# Patient Record
Sex: Female | Born: 1965 | Race: Black or African American | Hispanic: No | Marital: Married | Smoking: Never smoker
Health system: Southern US, Community
[De-identification: ages and names within clinical notes are randomized; demographics above are authoritative.]

## PROBLEM LIST (undated history)

## (undated) DIAGNOSIS — C50919 Malignant neoplasm of unspecified site of unspecified female breast: Secondary | ICD-10-CM

## (undated) DIAGNOSIS — E785 Hyperlipidemia, unspecified: Secondary | ICD-10-CM

## (undated) DIAGNOSIS — Z923 Personal history of irradiation: Secondary | ICD-10-CM

## (undated) DIAGNOSIS — N920 Excessive and frequent menstruation with regular cycle: Secondary | ICD-10-CM

## (undated) DIAGNOSIS — I10 Essential (primary) hypertension: Secondary | ICD-10-CM

## (undated) DIAGNOSIS — E663 Overweight: Secondary | ICD-10-CM

## (undated) DIAGNOSIS — Z9221 Personal history of antineoplastic chemotherapy: Secondary | ICD-10-CM

## (undated) DIAGNOSIS — R600 Localized edema: Secondary | ICD-10-CM

## (undated) DIAGNOSIS — E119 Type 2 diabetes mellitus without complications: Secondary | ICD-10-CM

## (undated) DIAGNOSIS — R609 Edema, unspecified: Secondary | ICD-10-CM

## (undated) DIAGNOSIS — Z8669 Personal history of other diseases of the nervous system and sense organs: Secondary | ICD-10-CM

## (undated) DIAGNOSIS — D649 Anemia, unspecified: Secondary | ICD-10-CM

## (undated) HISTORY — DX: Overweight: E66.3

## (undated) HISTORY — PX: TUBAL LIGATION: SHX77

## (undated) HISTORY — DX: Anemia, unspecified: D64.9

## (undated) HISTORY — DX: Essential (primary) hypertension: I10

## (undated) HISTORY — DX: Type 2 diabetes mellitus without complications: E11.9

## (undated) HISTORY — PX: COSMETIC SURGERY: SHX468

## (undated) HISTORY — PX: COLONOSCOPY: SHX174

## (undated) HISTORY — DX: Excessive and frequent menstruation with regular cycle: N92.0

## (undated) HISTORY — DX: Hyperlipidemia, unspecified: E78.5

## (undated) HISTORY — PX: CHOLECYSTECTOMY: SHX55

## (undated) HISTORY — PX: POLYPECTOMY: SHX149

---

## 1999-12-08 ENCOUNTER — Encounter (INDEPENDENT_AMBULATORY_CARE_PROVIDER_SITE_OTHER): Payer: Self-pay

## 1999-12-08 ENCOUNTER — Inpatient Hospital Stay (HOSPITAL_COMMUNITY): Admission: AD | Admit: 1999-12-08 | Discharge: 1999-12-11 | Payer: Self-pay | Admitting: Obstetrics and Gynecology

## 2000-01-11 ENCOUNTER — Other Ambulatory Visit: Admission: RE | Admit: 2000-01-11 | Discharge: 2000-01-11 | Payer: Self-pay | Admitting: Obstetrics and Gynecology

## 2001-04-10 ENCOUNTER — Other Ambulatory Visit: Admission: RE | Admit: 2001-04-10 | Discharge: 2001-04-10 | Payer: Self-pay | Admitting: Gynecology

## 2002-05-28 ENCOUNTER — Other Ambulatory Visit: Admission: RE | Admit: 2002-05-28 | Discharge: 2002-05-28 | Payer: Self-pay | Admitting: Gynecology

## 2003-06-07 ENCOUNTER — Other Ambulatory Visit: Admission: RE | Admit: 2003-06-07 | Discharge: 2003-06-07 | Payer: Self-pay | Admitting: Gynecology

## 2004-06-08 ENCOUNTER — Other Ambulatory Visit: Admission: RE | Admit: 2004-06-08 | Discharge: 2004-06-08 | Payer: Self-pay | Admitting: Gynecology

## 2004-06-10 ENCOUNTER — Encounter: Payer: Self-pay | Admitting: Family Medicine

## 2004-06-10 LAB — CONVERTED CEMR LAB

## 2004-07-27 ENCOUNTER — Ambulatory Visit: Payer: Self-pay | Admitting: Internal Medicine

## 2004-08-18 ENCOUNTER — Ambulatory Visit: Payer: Self-pay | Admitting: Family Medicine

## 2004-08-31 ENCOUNTER — Ambulatory Visit: Payer: Self-pay | Admitting: Family Medicine

## 2005-03-09 ENCOUNTER — Ambulatory Visit: Payer: Self-pay | Admitting: Family Medicine

## 2005-07-23 ENCOUNTER — Other Ambulatory Visit: Admission: RE | Admit: 2005-07-23 | Discharge: 2005-07-23 | Payer: Self-pay | Admitting: Gynecology

## 2005-08-16 ENCOUNTER — Ambulatory Visit: Payer: Self-pay | Admitting: Family Medicine

## 2005-09-04 ENCOUNTER — Ambulatory Visit: Payer: Self-pay | Admitting: Family Medicine

## 2005-12-25 ENCOUNTER — Ambulatory Visit: Payer: Self-pay | Admitting: Family Medicine

## 2005-12-26 ENCOUNTER — Encounter: Admission: RE | Admit: 2005-12-26 | Discharge: 2005-12-26 | Payer: Self-pay | Admitting: Family Medicine

## 2006-08-05 ENCOUNTER — Ambulatory Visit: Payer: Self-pay | Admitting: Family Medicine

## 2006-09-11 ENCOUNTER — Ambulatory Visit: Payer: Self-pay | Admitting: Family Medicine

## 2006-11-06 ENCOUNTER — Ambulatory Visit: Payer: Self-pay | Admitting: Family Medicine

## 2006-11-06 LAB — CONVERTED CEMR LAB
ALT: 16 units/L (ref 0–40)
AST: 22 units/L (ref 0–37)
BUN: 8 mg/dL (ref 6–23)
Chloride: 105 meq/L (ref 96–112)
Cholesterol: 139 mg/dL (ref 0–200)
GFR calc Af Amer: 102 mL/min
Lymphocytes Relative: 27 % (ref 12.0–46.0)
MCHC: 34.5 g/dL (ref 30.0–36.0)
MCV: 86.8 fL (ref 78.0–100.0)
Monocytes Absolute: 0.6 10*3/uL (ref 0.2–0.7)
Neutrophils Relative %: 63.1 % (ref 43.0–77.0)
Phosphorus: 2.5 mg/dL (ref 2.3–4.6)
Potassium: 3.6 meq/L (ref 3.5–5.1)
Sodium: 139 meq/L (ref 135–145)
Triglycerides: 105 mg/dL (ref 0–149)
WBC: 8.8 10*3/uL (ref 4.5–10.5)

## 2007-07-12 LAB — CONVERTED CEMR LAB: Pap Smear: NORMAL

## 2007-08-15 ENCOUNTER — Encounter: Payer: Self-pay | Admitting: Family Medicine

## 2007-08-15 DIAGNOSIS — I10 Essential (primary) hypertension: Secondary | ICD-10-CM

## 2007-08-20 ENCOUNTER — Ambulatory Visit: Payer: Self-pay | Admitting: Family Medicine

## 2007-08-25 LAB — CONVERTED CEMR LAB
Calcium: 9.3 mg/dL (ref 8.4–10.5)
Chloride: 102 meq/L (ref 96–112)
GFR calc Af Amer: 89 mL/min
GFR calc non Af Amer: 74 mL/min
Glucose, Bld: 102 mg/dL — ABNORMAL HIGH (ref 70–99)
HCT: 36.7 % (ref 36.0–46.0)
Lymphocytes Relative: 28.1 % (ref 12.0–46.0)
MCHC: 33.8 g/dL (ref 30.0–36.0)
MCV: 89 fL (ref 78.0–100.0)
Neutrophils Relative %: 63 % (ref 43.0–77.0)
Platelets: 251 10*3/uL (ref 150–400)
RBC: 4.13 M/uL (ref 3.87–5.11)
RDW: 13.6 % (ref 11.5–14.6)
Sodium: 138 meq/L (ref 135–145)
TSH: 0.63 microintl units/mL (ref 0.35–5.50)

## 2007-12-05 ENCOUNTER — Telehealth: Payer: Self-pay | Admitting: Family Medicine

## 2008-03-22 ENCOUNTER — Telehealth: Payer: Self-pay | Admitting: Family Medicine

## 2008-03-23 ENCOUNTER — Ambulatory Visit: Payer: Self-pay | Admitting: Family Medicine

## 2008-04-02 ENCOUNTER — Telehealth (INDEPENDENT_AMBULATORY_CARE_PROVIDER_SITE_OTHER): Payer: Self-pay | Admitting: *Deleted

## 2008-08-24 ENCOUNTER — Ambulatory Visit: Payer: Self-pay | Admitting: Family Medicine

## 2008-08-26 LAB — CONVERTED CEMR LAB
ALT: 19 units/L (ref 0–35)
AST: 24 units/L (ref 0–37)
Basophils Absolute: 0 10*3/uL (ref 0.0–0.1)
Basophils Relative: 0 % (ref 0.0–3.0)
Calcium: 9.2 mg/dL (ref 8.4–10.5)
Cholesterol: 153 mg/dL (ref 0–200)
Creatinine, Ser: 0.8 mg/dL (ref 0.4–1.2)
GFR calc Af Amer: 101 mL/min
Hemoglobin: 10.9 g/dL — ABNORMAL LOW (ref 12.0–15.0)
LDL Cholesterol: 91 mg/dL (ref 0–99)
MCV: 87.9 fL (ref 78.0–100.0)
Monocytes Absolute: 0.3 10*3/uL (ref 0.1–1.0)
Neutro Abs: 7.2 10*3/uL (ref 1.4–7.7)
Phosphorus: 3.4 mg/dL (ref 2.3–4.6)
RBC: 3.58 M/uL — ABNORMAL LOW (ref 3.87–5.11)
TSH: 1.05 microintl units/mL (ref 0.35–5.50)
Total Bilirubin: 0.4 mg/dL (ref 0.3–1.2)
Total Protein: 7.4 g/dL (ref 6.0–8.3)
WBC: 10.7 10*3/uL — ABNORMAL HIGH (ref 4.5–10.5)

## 2008-08-27 ENCOUNTER — Telehealth (INDEPENDENT_AMBULATORY_CARE_PROVIDER_SITE_OTHER): Payer: Self-pay | Admitting: *Deleted

## 2008-09-27 ENCOUNTER — Ambulatory Visit: Payer: Self-pay | Admitting: Family Medicine

## 2008-09-27 DIAGNOSIS — D509 Iron deficiency anemia, unspecified: Secondary | ICD-10-CM

## 2008-09-29 LAB — CONVERTED CEMR LAB
Basophils Absolute: 0.1 10*3/uL (ref 0.0–0.1)
Eosinophils Absolute: 0.2 10*3/uL (ref 0.0–0.7)
Hemoglobin: 11.7 g/dL — ABNORMAL LOW (ref 12.0–15.0)
Iron: 104 ug/dL (ref 42–145)
Lymphocytes Relative: 24.6 % (ref 12.0–46.0)
MCHC: 33.7 g/dL (ref 30.0–36.0)
MCV: 89.1 fL (ref 78.0–100.0)
Monocytes Absolute: 0.5 10*3/uL (ref 0.1–1.0)
Neutro Abs: 5 10*3/uL (ref 1.4–7.7)
Platelets: 240 10*3/uL (ref 150–400)
Transferrin: 296.5 mg/dL (ref >=212.0)

## 2008-10-27 ENCOUNTER — Ambulatory Visit: Payer: Self-pay | Admitting: Internal Medicine

## 2009-08-31 ENCOUNTER — Ambulatory Visit: Payer: Self-pay | Admitting: Family Medicine

## 2009-09-05 LAB — CONVERTED CEMR LAB
ALT: 18 units/L (ref 0–35)
Alkaline Phosphatase: 80 units/L (ref 39–117)
BUN: 10 mg/dL (ref 6–23)
Basophils Absolute: 0.1 10*3/uL (ref 0.0–0.1)
Basophils Relative: 0.7 % (ref 0.0–3.0)
Chloride: 107 meq/L (ref 96–112)
Cholesterol: 133 mg/dL (ref 0–200)
Eosinophils Absolute: 0.2 10*3/uL (ref 0.0–0.7)
Eosinophils Relative: 1.9 % (ref 0.0–5.0)
GFR calc non Af Amer: 87.87 mL/min (ref >60)
Glucose, Bld: 96 mg/dL (ref 70–99)
HCT: 35.7 % — ABNORMAL LOW (ref 36.0–46.0)
Hemoglobin: 11.8 g/dL — ABNORMAL LOW (ref 12.0–15.0)
Lymphs Abs: 2.5 10*3/uL (ref 0.7–4.0)
Monocytes Absolute: 0.7 10*3/uL (ref 0.1–1.0)
Monocytes Relative: 7.4 % (ref 3.0–12.0)
Neutro Abs: 5.3 10*3/uL (ref 1.4–7.7)
Platelets: 208 10*3/uL (ref 150.0–400.0)
RBC: 3.83 M/uL — ABNORMAL LOW (ref 3.87–5.11)
RDW: 14.1 % (ref 11.5–14.6)
WBC: 8.8 10*3/uL (ref 4.5–10.5)

## 2010-08-02 ENCOUNTER — Ambulatory Visit: Payer: Self-pay | Admitting: Internal Medicine

## 2010-09-07 ENCOUNTER — Ambulatory Visit: Payer: Self-pay | Admitting: Family Medicine

## 2010-09-13 LAB — CONVERTED CEMR LAB
Albumin: 3.6 g/dL (ref 3.5–5.2)
Alkaline Phosphatase: 88 units/L (ref 39–117)
Bilirubin, Direct: 0.1 mg/dL (ref 0.0–0.3)
Calcium: 8.8 mg/dL (ref 8.4–10.5)
Chloride: 101 meq/L (ref 96–112)
Eosinophils Absolute: 0.2 10*3/uL (ref 0.0–0.7)
Eosinophils Relative: 1.9 % (ref 0.0–5.0)
Glucose, Bld: 93 mg/dL (ref 70–99)
HCT: 38.7 % (ref 36.0–46.0)
HDL: 37.5 mg/dL — ABNORMAL LOW (ref >39.00)
Hemoglobin: 13 g/dL (ref 12.0–15.0)
LDL Cholesterol: 88 mg/dL (ref 0–99)
MCHC: 33.5 g/dL (ref 30.0–36.0)
MCV: 93.3 fL (ref 78.0–100.0)
Monocytes Relative: 6.8 % (ref 3.0–12.0)
Neutro Abs: 6 10*3/uL (ref 1.4–7.7)
Platelets: 233 10*3/uL (ref 150.0–400.0)
RDW: 14.8 % — ABNORMAL HIGH (ref 11.5–14.6)
Total CHOL/HDL Ratio: 4
Triglycerides: 59 mg/dL (ref 0.0–149.0)
VLDL: 11.8 mg/dL (ref 0.0–40.0)

## 2010-10-12 NOTE — Assessment & Plan Note (Signed)
Summary: URI   Vital Signs:  Patient profile:   45 year old female Height:      66 inches Weight:      187.25 pounds BMI:     30.33 Temp:     98.4 degrees F oral Pulse rate:   72 / minute Pulse rhythm:   regular BP sitting:   120 / 74  (left arm) Cuff size:   regular  Vitals Entered By: Selena Batten Dance CMA Duncan Dull) (August 02, 2010 11:45 AM) CC: URI   History of Present Illness: CC: URI?  6d h/o ST, hoaresness.  4d ago started feeling poorly - malaise, fatigue, bad cough..  ST better.  + congestion, cough, watery eyes, RN.  Cough productive of green sputum, keeping her up at night.  Worse at night.  Tried alkaseltzer plus, didn't help.    No fevers/chills, abd pain, n/v/d, rashes, myalgia/arthralgia.    no sick contacts at home.  No smokers at home.  no h/o allergies, asthma  Current Medications (verified): 1)  Triamterene-Hctz 75-50 Mg Tabs (Triamterene-Hctz) .... Take 1/2 Tablet Once A Day 2)  Potassium Chloride Crys Cr 20 Meq Tbcr (Potassium Chloride Crys Cr) .... Take 2 Tablet By Mouth Once A Day 3)  Cozaar 50 Mg Tabs (Losartan Potassium) .... 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 Head:  normocephalic, atraumatic, and no abnormalities observed.  NT sinuses Eyes:  PERRLA, EOMI Ears:  TMs clear bilaterally Nose:  congested Mouth:  pharynx pink and moist.  no exudates Neck:  supple with full rom and no masses or thyromegally, no JVD or carotid bruit  Lungs:  Normal respiratory effort, chest expands symmetrically. Lungs are clear to auscultation, no crackles or wheezes. Heart:  Normal rate and  regular rhythm. S1 and S2 normal without gallop, murmur, click, rub or other extra sounds. Pulses:  2+ rad pulses Extremities:  no pedal edema Skin:  Intact without suspicious lesions or rashes   Impression & Recommendations:  Problem # 1:  ACUTE BRONCHITIS (ICD-466.0) early bronchitis.  almost 7 days now.  given going into long weekend, provided with abx script to fill if not better by weekend.  Instructed on symptomatic treatment. Call if symptoms persist or worsen.   hopeful to not need abx.  cheratussin for night time cough.  Her updated medication list for this problem includes:    Cheratussin Ac 100-10 Mg/52ml Syrp (Guaifenesin-codeine) ..... One teaspoon at bedtime as needed cough    Zithromax Z-pak 250 Mg Tabs (Azithromycin) .Marland Kitchen... Take as directed  Complete Medication List: 1)  Triamterene-hctz 75-50 Mg Tabs (Triamterene-hctz) .... Take 1/2 tablet once a day 2)  Potassium Chloride Crys Cr 20 Meq Tbcr (Potassium chloride crys cr) .... Take 2 tablet by mouth once a day 3)  Cozaar 50 Mg Tabs (Losartan potassium) .... Take 1 tablet by mouth once a day 4)  Cheratussin Ac 100-10 Mg/78ml Syrp (Guaifenesin-codeine) .... One teaspoon at bedtime as needed cough 5)  Zithromax Z-pak 250 Mg Tabs (Azithromycin) .... Take as directed  Patient Instructions: 1)  Sounds like you have a viral upper respiratory infection.  2)  Antibiotics are not needed for this.  Viral infections usually take 7-10 days to resolve.  The cough can last up to 4-6 weeks to go away. 3)  Use medication as prescribed: zpack to hold on to in case not better by weekend.  cheratussin at night. 4)  May continue delsym or robitussin. 5)  consider nasal saline spray for congestion. 6)  Push fluids and plenty of rest. 7)  Please return if you are not improving as expected, or if you have high fevers (>101.5) or difficulty breathing/swallowing. 8)  Call clinic with questions.  Pleasure to see you today!  Prescriptions: ZITHROMAX  Z-PAK 250 MG TABS (AZITHROMYCIN) take as directed  #1 x 0   Entered and Authorized by:   Eustaquio Boyden  MD   Signed by:   Eustaquio Boyden  MD on 08/02/2010   Method used:   Print then Give to Patient   RxID:   [id number] CHERATUSSIN AC 100-10 MG/5ML SYRP (GUAIFENESIN-CODEINE) one teaspoon at bedtime as needed cough  #100cc x 0   Entered and Authorized by:   Eustaquio Boyden  MD   Signed by:   Eustaquio Boyden  MD on 08/02/2010   Method used:   Print then Give to Patient   RxID:   [id number]    Orders Added: 1)  Est. Patient Level III [84696]    Current Allergies (reviewed today): ACE INHIBITORS

## 2010-10-12 NOTE — Assessment & Plan Note (Signed)
Summary: F/U BP,REFILL MEDICINE/CLE   Vital Signs:  Patient profile:   45 year old female Weight:      190 pounds BMI:     30.78 Temp:     98.0 degrees F oral Pulse rate:   60 / minute Pulse rhythm:   regular BP sitting:   120 / 70  (left arm) Cuff size:   regular  Vitals Entered By: Mervin Hack CMA Duncan Dull) (September 07, 2010 8:11 AM) CC: follow-up visit for blood pressure and refill meds   History of Present Illness: here for f/u of HTN  wt is up 3 lb with bmi of 30  doing well -- no new medical problems in general   was sick before thanksgiving with laryngitis   bp great today 120/70-- has been well controlled on cozaar and maxzide  did gain some wt over the holidays  does terrible over wintertime  gets out a bit -- but only if decent outside   does the wii fit at home   is eating healthy diet except for sweets  needs to quit those   needs labs today for HTN   went to gyn on 12/19- everything was good  menses is irregular at this point  has anemia  does not take iron     Allergies: 1)  Ace Inhibitors  Past History:  Past Medical History: Last updated: 09/27/2008 Hypertension menorrhagia overwt  iron def anemia   GYN  Past Surgical History: Last updated: 09/27/2008 Caesarean section Cholecystectomy Tubal ligation MRI brain- neg (12/2005) gyn proceedure for heavy menses   Family History: Last updated: September 01, 2008 Father: deceased- lung cancer, smoker, HTN, ETOH  Mother: HTN, CVA age 24 x2 DM- died 01-25-23 Siblings: sister with HTN  Social History: Last updated: 2008/09/01 Marital Status: Married Children: 2 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 Eyes:  Denies blurring and eye irritation. CV:  Denies chest pain or discomfort, lightheadness, near fainting, and palpitations. Resp:  Denies cough,  shortness of breath, and wheezing. GI:  Denies abdominal pain, change in bowel habits, indigestion, and nausea. GU:  Denies dysuria, incontinence, and urinary frequency. MS:  Denies muscle aches and cramps. Derm:  Denies itching, lesion(s), poor wound healing, and rash. Neuro:  Denies headaches, numbness, and tingling. Psych:  Denies anxiety and depression. Endo:  Denies excessive thirst and excessive urination. Heme:  Denies abnormal bruising and bleeding.  Physical Exam  General:  overweight but generally well appearing  Head:  normocephalic, atraumatic, and no abnormalities observed.   Eyes:  vision grossly intact, pupils equal, pupils round, and pupils reactive to light.  no conjunctival pallor, injection or icterus  Mouth:  pharynx pink and moist.   Neck:  supple with full rom and no masses or thyromegally, no JVD or carotid bruit  Chest Wall:  No deformities, masses, or tenderness noted. Lungs:  Normal respiratory effort, chest expands symmetrically. Lungs are clear to auscultation, no crackles or wheezes. Heart:  Normal rate and regular rhythm. S1 and S2 normal without gallop, murmur, click, rub or other extra sounds. Msk:  No deformity or scoliosis noted of thoracic or lumbar spine.   Pulses:  2+ rad pulses Extremities:  no pedal edema Neurologic:  sensation intact to light touch, gait normal, and DTRs symmetrical and normal.   Skin:  Intact without suspicious lesions or rashes Cervical Nodes:  No lymphadenopathy  noted Psych:  normal affect, talkative and pleasant    Impression & Recommendations:  Problem # 1:  HYPERTENSION (ICD-401.9) Assessment Unchanged  this is well controlled on current medicine without change disc healthy diet (low simple sugar/ choose complex carbs/ low sat fat) diet and exercise in detail  disc imp of wt loss lab today meds refilled Her updated medication list for this problem includes:    Triamterene-hctz 75-50 Mg Tabs (Triamterene-hctz) .Marland Kitchen...  Take 1/2 tablet once a day    Cozaar 50 Mg Tabs (Losartan potassium) .Marland Kitchen... Take 1 tablet by mouth once a day  Orders: Venipuncture (16109) TLB-Renal Function Panel (80069-RENAL) TLB-Lipid Panel (80061-LIPID) TLB-CBC Platelet - w/Differential (85025-CBCD) TLB-Hepatic/Liver Function Pnl (80076-HEPATIC) TLB-TSH (Thyroid Stimulating Hormone) (60454-UJW) Prescription Created Electronically [id number])  BP today: 120/70 Prior BP: 120/74 (08/02/2010)  Labs Reviewed: K+: 3.7 (08/31/2009) Creat: : 0.9 (08/31/2009)   Chol: 133 (08/31/2009)   HDL: 36.00 (08/31/2009)   LDL: 87 (08/31/2009)   TG: 50.0 (08/31/2009)  Problem # 2:  ANEMIA, IRON DEFICIENCY (ICD-280.9) Assessment: Unchanged  from menses- irregular utd gyn care  not on iron check cbc  no symptoms  Orders: TLB-CBC Platelet - w/Differential (85025-CBCD) Prescription Created Electronically [phone])  Hgb: 11.8 (08/31/2009)   Hct: 35.7 (08/31/2009)   Platelets: 208.0 (08/31/2009) RBC: 3.83 (08/31/2009)   RDW: 14.1 (08/31/2009)   WBC: 8.8 (08/31/2009) MCV: 93.3 (08/31/2009)   MCHC: 33.1 (08/31/2009) Iron: 104 (09/27/2008)   % Sat: 25.1 (09/27/2008) TSH: 0.94 (08/31/2009)  Complete Medication List: 1)  Triamterene-hctz 75-50 Mg Tabs (Triamterene-hctz) .... Take 1/2 tablet once a day 2)  Potassium Chloride Crys Cr 20 Meq Tbcr (Potassium chloride crys cr) .... Take 2 tablet by mouth once a day 3)  Cozaar 50 Mg Tabs (Losartan potassium) .... Take 1 tablet by mouth once a day  Patient Instructions: 1)  It is important that you exercise reguarly at least 20 minutes 5 times a week. If you develop chest pain, have severe difficulty breathing, or feel very tired, stop exercising immediately and seek medical attention.  2)  work on [employer] - get sweets out of the house  3)  bp is good  4)  labs today  Prescriptions: COZAAR 50 MG TABS (LOSARTAN POTASSIUM) Take 1 tablet by mouth once a day  #30 x 12   Entered by:   Mervin Hack CMA  (AAMA)   Authorized by:   Judith Part MD   Signed by:   Mervin Hack CMA (AAMA) on 09/07/2010   Method used:   Electronically to        [facility] [id number] (retail)       [address]       [place], [place]  [address]       Ph: [phone] or [phone]       Fax: [phone]   RxID:   [id number] POTASSIUM CHLORIDE CRYS CR 20 MEQ TBCR (POTASSIUM CHLORIDE CRYS CR) Take 2 tablet by mouth once a day  #60 x 12   Entered by:   Mervin Hack CMA (AAMA)   Authorized by:   Judith Part MD   Signed by:   Mervin Hack CMA (AAMA) on 09/07/2010   Method used:   [facility]        [facility] (retail[facility]       [address]       [place], [place]  [address]       Ph: [phone] or [phone]  Fax: [phone]   RxID:   [id number] TRIAMTERENE-HCTZ 75-50 MG TABS (TRIAMTERENE-HCTZ) Take 1/2 tablet once a day  #30 x 6   Entered by:   Mervin Hack CMA (AAMA)   Authorized by:   Judith Part MD   Signed by:   Mervin Hack CMA (AAMA) on 09/07/2010   Method used:   [facility]        [facility] [id number]       [address]       [place], [place]  [address]       Ph: [phone] or [phone]       Fax: [phone]   RxID:   [id number]    Orders Added: 1)  Venipuncture [18841] 2)  TLB-Renal Function Panel [80069-RENAL] 3)  TLB-Lipid Panel [80061-LIPID] 4)  TLB-CBC Platelet - w/Differential [85025-CBCD] 5)  TLB-Hepatic/Liver Function Pnl [80076-HEPATIC] 6)  TLB-TSH (Thyroid Stimulating Hormone) [84443-TSH] 7)  Est. Patient Level III [66063] 8)  Prescription Created Electronically [phone]    Current Allergies (reviewed today): ACE INHIBITORS   Preventive Care Screening  Pap Smear:    Date:  08/28/2010    Results:  normal      mam sched for jan 2012

## 2011-01-26 NOTE — Op Note (Signed)
[facility]  Patient:    Lindsey Wright, Lindsey Wright                MRN: [id number] Proc. Date: 12/08/99 Adm. Date:  [id number] Attending:  Miguel Aschoff                           Operative Report  PREOPERATIVE DIAGNOSES:       1. Intrauterine pregnancy at 38 weeks.                               2. Early labor.                               3. Previous cesarean section.                               4. Desired sterilization.  POSTOPERATIVE DIAGNOSES:      1. Intrauterine pregnancy at 38 weeks.                               2. Early labor.                               3. Previous cesarean section.                               4. Desired sterilization.                               5. Delivery of viable female infant.  Apgars 8 and 9.  PROCEDURE:                    Repeat left lateral transverse cesarean section and bilateral ______ tubal sterilization.  SURGEON:                      Miguel Aschoff, M.D.  ANESTHESIA:                   Spinal.  COMPLICATIONS:                None.  JUSTIFICATION:                Patient is a 45 year old black female gravida 2, ara 1-0-0-1 with an estimated date of confinement of December 19, 1999.  The patient had been scheduled for elective repeat cesarean section for April 4.  The patient developed spontaneous labor on the date of admission and in view of her onset of labor she is being taken to the operating room at this time to undergo elective  repeat cesarean section after declining an attempt at [facility].  The patient also desires a permanent sterilization and has signed informed consent for tubal sterilization.  PROCEDURE:                    Patient was taken to the operating room, placed in the lateral position and spinal anesthesia was placed without difficulty.  She as then placed in the supine position deviated to the left and prepped  and draped n the usual sterile fashion.  A Foley catheter was inserted.   After this was done a Pfannenstiel incision was made, extended down through the subcutaneous tissue with ______ being clamped and coagulation encountered.  The fascia was then identified, incised transversely.  It was then separated from the underlying rectus muscles. Rectus muscles were divided in the midline.  The peritoneum was identified and hen entered carefully avoiding underlying structures.  At this point a bladder flap was created, protected with a bladder blade.  An elliptical transverse incision was  then made into lower uterine segment and cavity was entered.  At this point the  patient was delivered of a viable female infant.  Apgar 8 at one minute and 9 at five minutes from low vertex and low A position.  The baby was handed to the pediatric team in attendance.  Cord bloods were then obtained for appropriate studies.  The placenta was delivered.  The uterus was then evacuated of any remaining products of conception.  The angles of the uterine incision were then  ligated using figure-of-eight sutures with 1 Vicryl then the uterus was closed n layers.  The first layer was a running interlocking suture of 1 Vicryl followed by an embrocating suture of 1 Vicryl.  The bladder flap was then reapproximated using running 2-0 Vicryl suture.  After this was done attention was directed to the right tube which was brought into view, grasped in its midportion with the Babcock clamp.  A knuckle of the tube was thus created and this knuckle of tube was ligated with two ligatures of 0 plain cut.  The ligatures was then excised and tubal snubs were cauterized.  The identical procedure was carried out on the left side.  At  this point lap counts were taken and found to be correct.  The abdomen was irrigated with warm saline and then the abdomen was closed.  The peritoneum was  closed using running continuous 0 Vicryl suture.  The rectus muscles were reapproximated using running  continuous 0 Vicryl suture.  Fascia was closed using two sutures of 0 Vicryl each side in the lateral fascial angle and meeting in the midline.  Subcutaneous tissue and skin were closed using staples.  Estimated blood loss was approximately 600 cc.  Patient tolerated the procedure well and went to the recovery room in satisfactory condition. DD:  12/08/99 TD:  12/08/99 Job: [id number]

## 2011-01-26 NOTE — Discharge Summary (Signed)
[facility]  Patient:    Lindsey Wright, Lindsey Wright                MRN: [id number] Adm. Date:  [id number] Disc. Date: [id number] Attending:  Miguel Aschoff Dictator:   Leilani Able, P.A.                           Discharge Summary  FINAL DIAGNOSES:              1. Intrauterine pregnancy at [redacted] weeks gestation.                               2. Early labor.                               3. Previous cesarean section.                               4. Desires permanent sterilization.                               5. Pregnancy-induced hypertension.  PROCEDURES:                   1. Repeat left lateral transverse cesarean section.                               2. Bilateral tubal ligation.  SURGEON:                      Dr. Miguel Aschoff.  COMPLICATIONS:                None.  HISTORY OF PRESENT ILLNESS:   This 45 year old G1, P1-0-0-1, presents on March 0, 2001, in early labor.  The patient is about [redacted] weeks gestation.  She had been scheduled for an elective repeat cesarean section on December 13, 1999.  Because of the spontaneous labor on the day of admission, she is being taken to the operating oom at this time to undergo a repeat cesarean section.  The patient also desires a permanent sterilization, and has signed the consent forms.  HOSPITAL COURSE:              The patient is taken to the operating room on December 08, 1999, by Dr. Miguel Aschoff, where a repeat left lateral transverse cesarean section is performed, with the delivery of a 9-pound, 1-ounce female infant with Apgars of 8 and 9.  The delivery went without complications.  At this point, bilateral tubal ligation was performed.  The procedure went without complication. The patients postoperative course was complicated by some elevated blood pressures.  The patient was continued on magnesium sulfate for 24 hours.  She was stable, not having any headaches or visual changes.  The magnesium sulfate  was stopped on December 10, 1999, and she was transferred to the regular mother-baby unit. She was also started on labetalol 100 mg 1 b.i.d. at this time.  The patients blood pressure began to stabilize on the labetalol.  She was sent home on December 11, 1999, by Dr. Malva Limes.  The patient was sent home on a regular diet, told o  decrease activities, told to continue prenatal vitamins.  Was given Tylox 1-2 every four hours as needed for pain, was given labetalol 100 mg 1 b.i.d., and told to  follow up in the office in one week for a blood pressure check.  DISCHARGE LABORATORY DATA:    The patients hemoglobin was 11.6, and white blood  cell count was 12.2. DD:  12/27/99 TD:  12/27/99 Job: [id number]

## 2011-05-27 ENCOUNTER — Inpatient Hospital Stay (INDEPENDENT_AMBULATORY_CARE_PROVIDER_SITE_OTHER)
Admission: RE | Admit: 2011-05-27 | Discharge: 2011-05-27 | Disposition: A | Discharge status: Home / Self Care | Payer: 59 | Source: Ambulatory Visit | Attending: Family Medicine | Admitting: Family Medicine

## 2011-05-27 DIAGNOSIS — L509 Urticaria, unspecified: Secondary | ICD-10-CM

## 2011-08-29 ENCOUNTER — Encounter: Payer: Self-pay | Admitting: Family Medicine

## 2011-08-29 ENCOUNTER — Ambulatory Visit (INDEPENDENT_AMBULATORY_CARE_PROVIDER_SITE_OTHER): Payer: 59 | Admitting: Family Medicine

## 2011-08-29 VITALS — BP 124/80 | HR 72 | Temp 98.0°F | Ht 66.0 in | Wt 189.2 lb

## 2011-08-29 DIAGNOSIS — I10 Essential (primary) hypertension: Secondary | ICD-10-CM

## 2011-08-29 DIAGNOSIS — J40 Bronchitis, not specified as acute or chronic: Secondary | ICD-10-CM

## 2011-08-29 LAB — CBC WITH DIFFERENTIAL/PLATELET
HCT: 40.7 % (ref 36.0–46.0)
Lymphocytes Relative: 26.1 % (ref 12.0–46.0)
Lymphs Abs: 2.9 10*3/uL (ref 0.7–4.0)
Neutrophils Relative %: 63.9 % (ref 43.0–77.0)
RBC: 4.38 Mil/uL (ref 3.87–5.11)
WBC: 10.9 10*3/uL — ABNORMAL HIGH (ref 4.5–10.5)

## 2011-08-29 LAB — COMPREHENSIVE METABOLIC PANEL
ALT: 25 U/L (ref 0–35)
AST: 28 U/L (ref 0–37)
Albumin: 4 g/dL (ref 3.5–5.2)
Alkaline Phosphatase: 91 U/L (ref 39–117)
BUN: 13 mg/dL (ref 6–23)
CO2: 28 mEq/L (ref 19–32)
Calcium: 9.1 mg/dL (ref 8.4–10.5)
Creatinine, Ser: 0.9 mg/dL (ref 0.4–1.2)
GFR: 90.54 mL/min (ref >60.00)
Glucose, Bld: 97 mg/dL (ref 70–99)
Total Bilirubin: 0.6 mg/dL (ref 0.3–1.2)

## 2011-08-29 MED ORDER — LOSARTAN POTASSIUM 50 MG PO TABS: 50.0000 mg | ORAL_TABLET | Freq: Every day | ORAL | Status: DC

## 2011-08-29 MED ORDER — AZITHROMYCIN 250 MG PO TABS: ORAL_TABLET | ORAL | Status: AC

## 2011-08-29 MED ORDER — POTASSIUM CHLORIDE CRYS ER 20 MEQ PO TBCR: 40.0000 meq | EXTENDED_RELEASE_TABLET | Freq: Every day | ORAL | Status: DC

## 2011-08-29 MED ORDER — TRIAMTERENE-HCTZ 75-50 MG PO TABS: 0.5000 | ORAL_TABLET | Freq: Every day | ORAL | Status: DC

## 2011-08-29 NOTE — Progress Notes (Signed)
Subjective:    Patient ID: Lindsey Wright, female    DOB: 11/10/1965, 45 y.o.   MRN: [id number]  HPI Here for uri symptoms and also f/u HTN Started symptoms over a mo ago - bad cough then got better pretty quickly Took cough med otc - delsym-- lingered for a while  Then new cold started over a week ago  Eye R is running - few days -- ? If a little irritated  Nose cong / throat sore  Yellow / green phlegm  No facial  Coughing up phlegm-- never got completely better No wheeze     bp is  124/80   Today Good control  On cozaar, maxzide and potassium Is good about not missing doses    Chemistry      Component Value Date/Time   NA 135 09/07/2010 0821   K 3.9 09/07/2010 0821   CL 101 09/07/2010 0821   CO2 28 09/07/2010 0821   BUN 10 09/07/2010 0821   CREATININE 0.8 09/07/2010 0821      Component Value Date/Time   CALCIUM 8.8 09/07/2010 0821   ALKPHOS 88 09/07/2010 0821   AST 24 09/07/2010 0821   ALT 20 09/07/2010 0821   BILITOT 0.4 09/07/2010 0821     due for re check  No cp or palpitations or headaches or edema   Lab Results  Component Value Date   CHOL 137 09/07/2010   CHOL 133 08/31/2009   CHOL 153 08/24/2008   Lab Results  Component Value Date   HDL 37.50* 09/07/2010   HDL 36.00* 08/31/2009   HDL 36.5* 08/24/2008   Lab Results  Component Value Date   LDLCALC 88 09/07/2010   LDLCALC 87 08/31/2009   LDLCALC 91 08/24/2008   Lab Results  Component Value Date   TRIG 59.0 09/07/2010   TRIG 50.0 08/31/2009   TRIG 130 08/24/2008   Lab Results  Component Value Date   CHOLHDL 4 09/07/2010   CHOLHDL 4 08/31/2009   CHOLHDL 4.2 CALC 08/24/2008   No results found for this basename: LDLDIRECT   chol good in past Diet fair Needs to exercise and loose wt    imms needs flu shot  No side effects to medicines    Patient Active Problem List  Diagnoses  . ANEMIA, IRON DEFICIENCY  . HYPERTENSION  . Bronchitis   Past Medical History  Diagnosis Date  .  Hypertension   . Menorrhagia   . Overweight   . Anemia     iron deficient anemia   Past Surgical History  Procedure Date  . Cosmetic surgery   . Cholecystectomy   . Tubal ligation    History  Substance Use Topics  . Smoking status: Never Smoker   . Smokeless tobacco: Not on file  . Alcohol Use: No   Family History  Problem Relation Age of Onset  . Hypertension Mother   . Diabetes Mother   . Stroke Mother   . Alcohol abuse Father   . Cancer Father     lung ca  . Hypertension Father   . Hypertension Sister    Allergies  Allergen Reactions  . Ace Inhibitors    No current outpatient prescriptions on file prior to visit.      Review of Systems Review of Systems  Constitutional: Negative for fever, appetite change, fatigue and unexpected weight change.  Eyes: Negative for pain and visual disturbance.  ENT pos for congestion/ neg for facial or ear pain  Respiratory: Negative for  wheeze  and shortness of breath.   Cardiovascular: Negative for cp or palpitations    Gastrointestinal: Negative for nausea, diarrhea and constipation.  Genitourinary: Negative for urgency and frequency.  Skin: Negative for pallor or rash   Neurological: Negative for weakness, light-headedness, numbness and headaches.  Hematological: Negative for adenopathy. Does not bruise/bleed easily.  Psychiatric/Behavioral: Negative for dysphoric mood. The patient is not nervous/anxious.          Objective:   Physical Exam  Constitutional: She appears well-developed and well-nourished. No distress.       overwt and well appearing  A little tired   HENT:  Head: Normocephalic and atraumatic.  Right Ear: External ear normal.  Left Ear: External ear normal.  Mouth/Throat: No oropharyngeal exudate.       Nares are injected and congested  No sinus tenderness  Post nasal drip noted   Eyes: Conjunctivae and EOM are normal. Pupils are equal, round, and reactive to light. Right eye exhibits discharge.  Left eye exhibits no discharge.       Clear discharge from R eye without injection or swelling   Neck: Normal range of motion. Neck supple. No JVD present. Carotid bruit is not present. No thyromegaly present.  Cardiovascular: Normal rate, regular rhythm, normal heart sounds and intact distal pulses.  Exam reveals no gallop.   Pulmonary/Chest: Effort normal. No respiratory distress. She has wheezes. She has no rales.       Very scant wheeze on forced exp only Few scattered rhonchi No rales   Abdominal: Soft. Bowel sounds are normal. She exhibits no abdominal bruit and no mass.  Musculoskeletal: She exhibits no edema.  Lymphadenopathy:    She has no cervical adenopathy.  Neurological: She is alert. She has normal reflexes. No cranial nerve deficit. She exhibits normal muscle tone. Coordination normal.  Skin: Skin is warm and dry. No rash noted. No erythema. No pallor.  Psychiatric: She has a normal mood and affect.          Assessment & Plan:

## 2011-08-29 NOTE — Assessment & Plan Note (Signed)
bp in fair control at this time  No changes needed  Disc lifstyle change with low sodium diet and exercise   Refilled med and K  Lab today   Did recommend strongly that she need flu shot after she feels better from this uri

## 2011-08-29 NOTE — Patient Instructions (Signed)
I think you have a cold - which is a virus and has to resolve over time Also bronchitis -- which may be bacterial due to length of cough (take the zithromax as directed) If not improving in 2 weeks follow up  If worse or high fever let me know  Drink lots of fluids Try nasal saline over the counter for congestion Delsym is fine  Labs today for high blood pressure  Blood pressure is good today Work on exercise and weight loss When your illness is improved 1-2 weeks - get a flu shot here or at pharmacy

## 2011-08-29 NOTE — Assessment & Plan Note (Signed)
After over 11/2 mo of cough - very scant reactive airways - now with new uri on top of it Cover with zithromax Explained this will not cure her cold Disc symptomatic care - see instructions on AVS  Update if not imp 1-2 wk or if worse

## 2011-09-05 ENCOUNTER — Ambulatory Visit: Payer: 59 | Admitting: Family Medicine

## 2011-09-10 ENCOUNTER — Other Ambulatory Visit: Payer: Self-pay | Admitting: Family Medicine

## 2011-10-08 ENCOUNTER — Other Ambulatory Visit: Payer: Self-pay | Admitting: Family Medicine

## 2011-10-29 ENCOUNTER — Other Ambulatory Visit: Payer: Self-pay | Admitting: Gynecology

## 2011-10-29 DIAGNOSIS — R928 Other abnormal and inconclusive findings on diagnostic imaging of breast: Secondary | ICD-10-CM

## 2011-11-05 ENCOUNTER — Ambulatory Visit
Admission: RE | Admit: 2011-11-05 | Discharge: 2011-11-05 | Disposition: A | Discharge status: Home / Self Care | Payer: 59 | Source: Ambulatory Visit | Attending: Gynecology | Admitting: Gynecology

## 2011-11-05 DIAGNOSIS — R928 Other abnormal and inconclusive findings on diagnostic imaging of breast: Secondary | ICD-10-CM

## 2012-05-29 ENCOUNTER — Telehealth: Payer: Self-pay | Admitting: Family Medicine

## 2012-05-29 NOTE — Telephone Encounter (Signed)
Will see her then 

## 2012-05-29 NOTE — Telephone Encounter (Signed)
Caller: Ceana/Patient; Patient Name: Lindsey Wright; PCP: Roxy Manns [facility]); Best Callback Phone Number: [phone]; Reason for call: Caller reports the side of her left foot developed sensation of pins and needles yesterday, Wed 9/18 afternoon. Still present this am. No pain and no other problems. Per Numbness or Tingling Protocol, See PCP in 72 hours, New or worsening change in sensation in extremities and no other symptoms. Appointment scheduled for Friday 05/30/12 at 8:15 with Dr Milinda Antis. Caller is agreeable.

## 2012-05-30 ENCOUNTER — Ambulatory Visit (INDEPENDENT_AMBULATORY_CARE_PROVIDER_SITE_OTHER): Payer: 59 | Admitting: Family Medicine

## 2012-05-30 ENCOUNTER — Encounter: Payer: Self-pay | Admitting: Family Medicine

## 2012-05-30 VITALS — BP 118/78 | HR 66 | Temp 98.5°F | Ht 66.5 in | Wt 189.2 lb

## 2012-05-30 DIAGNOSIS — R202 Paresthesia of skin: Secondary | ICD-10-CM

## 2012-05-30 DIAGNOSIS — R209 Unspecified disturbances of skin sensation: Secondary | ICD-10-CM

## 2012-05-30 NOTE — Assessment & Plan Note (Signed)
Very focal parethesia in lateral L foot - with no back pain or other symptoms  Mild dec sens to light touch in that area Excellent perfusion Suspect pinched nerve-will recommend wide/ flat shoes for 2 weeks and update If worse - consider neuro w/u-- poss MRI for MS (doubtful) If not imp 2 weeks consider ncv

## 2012-05-30 NOTE — Patient Instructions (Addendum)
I'm not sure what is causing the tingling in your foot  It could be a pinched nerve  Please wear a wide shoe with minimal heel for the next few weeks If worse or increase in symptoms or new areas-call If not improved in 2 weeks call

## 2012-05-30 NOTE — Progress Notes (Signed)
Subjective:    Patient ID: Lindsey Wright, female    DOB: 22-Feb-1966, 46 y.o.   MRN: [id number]  HPI Here for tingling in foot since wed eve Happened at work  Felt like the lateral side of foot is asleep  Still there and constant Is tingly but not totally numb (pins and needles) No pain No pain in back or legs No weakness  No new shoes  Wears wedge heel shoes  Did wear shoes with buckle in lateral foot area  Patient Active Problem List  Diagnosis  . ANEMIA, IRON DEFICIENCY  . HYPERTENSION  . Bronchitis   Past Medical History  Diagnosis Date  . Hypertension   . Menorrhagia   . Overweight   . Anemia     iron deficient anemia   Past Surgical History  Procedure Date  . Cosmetic surgery   . Cholecystectomy   . Tubal ligation    History  Substance Use Topics  . Smoking status: Never Smoker   . Smokeless tobacco: Not on file  . Alcohol Use: No   Family History  Problem Relation Age of Onset  . Hypertension Mother   . Diabetes Mother   . Stroke Mother   . Alcohol abuse Father   . Cancer Father     lung ca  . Hypertension Father   . Hypertension Sister    Allergies  Allergen Reactions  . Ace Inhibitors    Current Outpatient Prescriptions on File Prior to Visit  Medication Sig Dispense Refill  . losartan (COZAAR) 50 MG tablet Take 1 tablet (50 mg total) by mouth daily.  30 tablet  11  . potassium chloride SA (K-DUR,KLOR-CON) 20 MEQ tablet Take 2 tablets (40 mEq total) by mouth daily.  60 tablet  11  . triamterene-hydrochlorothiazide (MAXZIDE) 75-50 MG per tablet Take 0.5 tablets by mouth daily.  15 tablet  11      Review of Systems Review of Systems  Constitutional: Negative for fever, appetite change, fatigue and unexpected weight change.  Eyes: Negative for pain and visual disturbance.  Respiratory: Negative for cough and shortness of breath.   Cardiovascular: Negative for cp or palpitations    Gastrointestinal: Negative for nausea, diarrhea  and constipation.  Genitourinary: Negative for urgency and frequency.  Skin: Negative for pallor or rash   Neurological: Negative for weakness, light-headedness,  and headaches. neg for speech slurring or trouble concentrating  Hematological: Negative for adenopathy. Does not bruise/bleed easily.  Psychiatric/Behavioral: Negative for dysphoric mood. The patient is not nervous/anxious.         Objective:   Physical Exam  Constitutional: She appears well-developed and well-nourished. No distress.       obese and well appearing   HENT:  Head: Normocephalic and atraumatic.  Mouth/Throat: Oropharynx is clear and moist.  Eyes: Conjunctivae normal and EOM are normal. Pupils are equal, round, and reactive to light. Right eye exhibits no discharge. Left eye exhibits no discharge. No scleral icterus.  Neck: Normal range of motion. Neck supple. No JVD present. Carotid bruit is not present. No thyromegaly present.  Abdominal: She exhibits no abdominal bruit.  Musculoskeletal: She exhibits no edema and no tenderness.  Lymphadenopathy:    She has no cervical adenopathy.  Neurological: She is alert. She has normal reflexes. She displays no atrophy and no tremor. A sensory deficit is present. No cranial nerve deficit. She exhibits normal muscle tone. Coordination and gait normal.       Mild decrease to soft touch on  lateral L foot Nl proprioception/ temp/ sharp touch sensation No motor deficits  Skin: Skin is warm and dry. No rash noted. No erythema.  Psychiatric: She has a normal mood and affect.          Assessment & Plan:

## 2012-09-15 ENCOUNTER — Ambulatory Visit (INDEPENDENT_AMBULATORY_CARE_PROVIDER_SITE_OTHER): Payer: 59 | Admitting: Family Medicine

## 2012-09-15 ENCOUNTER — Encounter: Payer: Self-pay | Admitting: Family Medicine

## 2012-09-15 VITALS — BP 128/82 | HR 75 | Temp 98.3°F | Ht 66.5 in | Wt 189.5 lb

## 2012-09-15 DIAGNOSIS — I10 Essential (primary) hypertension: Secondary | ICD-10-CM

## 2012-09-15 DIAGNOSIS — Z23 Encounter for immunization: Secondary | ICD-10-CM

## 2012-09-15 LAB — COMPREHENSIVE METABOLIC PANEL
Albumin: 3.8 g/dL (ref 3.5–5.2)
BUN: 15 mg/dL (ref 6–23)
Chloride: 103 mEq/L (ref 96–112)
Glucose, Bld: 122 mg/dL — ABNORMAL HIGH (ref 70–99)
Potassium: 3.6 mEq/L (ref 3.5–5.1)
Total Protein: 7.8 g/dL (ref 6.0–8.3)

## 2012-09-15 LAB — CBC WITH DIFFERENTIAL/PLATELET
Basophils Relative: 0.7 % (ref 0.0–3.0)
Eosinophils Absolute: 0.2 10*3/uL (ref 0.0–0.7)
Eosinophils Relative: 2.5 % (ref 0.0–5.0)
HCT: 37.2 % (ref 36.0–46.0)
Hemoglobin: 12.4 g/dL (ref 12.0–15.0)
Lymphocytes Relative: 31 % (ref 12.0–46.0)
Lymphs Abs: 2.2 10*3/uL (ref 0.7–4.0)
MCHC: 33.4 g/dL (ref 30.0–36.0)
MCV: 87.5 fl (ref 78.0–100.0)
Monocytes Relative: 6.3 % (ref 3.0–12.0)
Neutro Abs: 4.2 10*3/uL (ref 1.4–7.7)
Neutrophils Relative %: 59.5 % (ref 43.0–77.0)
RBC: 4.25 Mil/uL (ref 3.87–5.11)
RDW: 14.8 % — ABNORMAL HIGH (ref 11.5–14.6)

## 2012-09-15 LAB — LIPID PANEL
LDL Cholesterol: 91 mg/dL (ref 0–99)
Total CHOL/HDL Ratio: 4

## 2012-09-15 MED ORDER — LOSARTAN POTASSIUM 50 MG PO TABS: 50.0000 mg | ORAL_TABLET | Freq: Every day | ORAL | Status: DC

## 2012-09-15 MED ORDER — TRIAMTERENE-HCTZ 75-50 MG PO TABS: 0.5000 | ORAL_TABLET | Freq: Every day | ORAL | Status: DC

## 2012-09-15 MED ORDER — POTASSIUM CHLORIDE CRYS ER 20 MEQ PO TBCR: 40.0000 meq | EXTENDED_RELEASE_TABLET | Freq: Every day | ORAL | Status: DC

## 2012-09-15 NOTE — Patient Instructions (Signed)
Labs today  No change in medicines Try to work on your schedule to allow yourself to exercise  Tdap (tetnus shot) - today  I recommend a flu vaccine if you change your mind

## 2012-09-15 NOTE — Assessment & Plan Note (Signed)
bp in fair control at this time  No changes needed  Disc lifstyle change with low sodium diet and exercise  Disc job schedule and self care  Also update Tdap today She declines flu shot

## 2012-09-15 NOTE — Progress Notes (Signed)
Subjective:    Patient ID: Lindsey Wright, female    DOB: 1966/07/25, 47 y.o.   MRN: [id number]  HPI Here for f/u for HTN bp is stable today  No cp or palpitations or headaches or edema  No side effects to medicines  BP Readings from Last 3 Encounters:  09/15/12 128/82  05/30/12 118/78  08/29/11 124/80     Wt is stable - obese  Diet - has not been doing well since her stress level went up , new pos at work - temporarily Is unable to go to zumba or do her exercise   Overall been doing ok - lot of stress with her job   occ her R hand feels like it is asleep  ? Carpal tunnel   Patient Active Problem List  Diagnosis  . ANEMIA, IRON DEFICIENCY  . HYPERTENSION  . Bronchitis  . Paresthesia of foot   Past Medical History  Diagnosis Date  . Hypertension   . Menorrhagia   . Overweight   . Anemia     iron deficient anemia   Past Surgical History  Procedure Date  . Cosmetic surgery   . Cholecystectomy   . Tubal ligation    History  Substance Use Topics  . Smoking status: Never Smoker   . Smokeless tobacco: Not on file  . Alcohol Use: No   Family History  Problem Relation Age of Onset  . Hypertension Mother   . Diabetes Mother   . Stroke Mother   . Alcohol abuse Father   . Cancer Father     lung ca  . Hypertension Father   . Hypertension Sister    Allergies  Allergen Reactions  . Ace Inhibitors    Current Outpatient Prescriptions on File Prior to Visit  Medication Sig Dispense Refill  . losartan (COZAAR) 50 MG tablet Take 1 tablet (50 mg total) by mouth daily.  30 tablet  11  . potassium chloride SA (K-DUR,KLOR-CON) 20 MEQ tablet Take 2 tablets (40 mEq total) by mouth daily.  60 tablet  11  . triamterene-hydrochlorothiazide (MAXZIDE) 75-50 MG per tablet Take 0.5 tablets by mouth daily.  15 tablet  11     Review of Systems    Review of Systems  Constitutional: Negative for fever, appetite change, fatigue and unexpected weight change.  Eyes:  Negative for pain and visual disturbance.  Respiratory: Negative for cough and shortness of breath.   Cardiovascular: Negative for cp or palpitations    Gastrointestinal: Negative for nausea, diarrhea and constipation.  Genitourinary: Negative for urgency and frequency.  Skin: Negative for pallor or rash   Neurological: Negative for weakness, light-headedness, and headaches. pos for occas numbness R hand Hematological: Negative for adenopathy. Does not bruise/bleed easily.  Psychiatric/Behavioral: Negative for dysphoric mood. The patient is not nervous/anxious.  pos for stressors     Objective:   Physical Exam  Constitutional: She appears well-developed and well-nourished. No distress.       obese and well appearing   HENT:  Head: Normocephalic and atraumatic.  Mouth/Throat: Oropharynx is clear and moist.  Eyes: Conjunctivae normal and EOM are normal. Pupils are equal, round, and reactive to light. Right eye exhibits no discharge. Left eye exhibits no discharge. No scleral icterus.  Neck: Neck supple. No JVD present. Carotid bruit is not present. No thyromegaly present.  Cardiovascular: Normal rate, regular rhythm, normal heart sounds and intact distal pulses.  Exam reveals no gallop.   Pulmonary/Chest: Effort normal and breath sounds normal.  No respiratory distress. She has no wheezes.  Abdominal: Soft. Bowel sounds are normal. She exhibits no distension, no abdominal bruit and no mass. There is no tenderness.  Musculoskeletal: She exhibits no edema.  Lymphadenopathy:    She has no cervical adenopathy.  Neurological: She is alert. She has normal reflexes.  Skin: Skin is warm and dry. No rash noted.  Psychiatric: She has a normal mood and affect.          Assessment & Plan:

## 2012-09-16 ENCOUNTER — Ambulatory Visit: Payer: 59

## 2012-09-16 DIAGNOSIS — R7309 Other abnormal glucose: Secondary | ICD-10-CM

## 2012-09-16 LAB — HEMOGLOBIN A1C: Hgb A1c MFr Bld: 6.7 % — ABNORMAL HIGH (ref 4.6–6.5)

## 2012-09-21 ENCOUNTER — Other Ambulatory Visit: Payer: Self-pay | Admitting: Family Medicine

## 2013-06-23 ENCOUNTER — Ambulatory Visit (INDEPENDENT_AMBULATORY_CARE_PROVIDER_SITE_OTHER): Payer: 59 | Admitting: Family Medicine

## 2013-06-23 ENCOUNTER — Encounter: Payer: Self-pay | Admitting: Family Medicine

## 2013-06-23 ENCOUNTER — Ambulatory Visit: Payer: 59 | Admitting: Family Medicine

## 2013-06-23 VITALS — BP 106/70 | HR 81 | Temp 99.1°F | Ht 66.5 in | Wt 191.5 lb

## 2013-06-23 DIAGNOSIS — J029 Acute pharyngitis, unspecified: Secondary | ICD-10-CM

## 2013-06-23 DIAGNOSIS — J069 Acute upper respiratory infection, unspecified: Secondary | ICD-10-CM

## 2013-06-23 NOTE — Progress Notes (Signed)
  Subjective:    Patient ID: Lindsey Wright, female    DOB: [birth year], 47 y.o.   MRN: [id number]  Cough This is a new problem. The current episode started 1 to 4 weeks ago. The problem has been gradually worsening. The cough is non-productive. Associated symptoms include ear congestion, ear pain, postnasal drip, rhinorrhea and a sore throat. Pertinent negatives include no fever, hemoptysis, myalgias, nasal congestion, rash, shortness of breath or wheezing. Associated symptoms comments: Cough worse at night. Risk factors: non smoker. Treatments tried: claritin.    No sick contacts, no strep exposure.    Review of Systems  Constitutional: Negative for fever.  HENT: Positive for ear pain, postnasal drip, rhinorrhea and sore throat.   Respiratory: Positive for cough. Negative for hemoptysis, shortness of breath and wheezing.   Musculoskeletal: Negative for myalgias.  Skin: Negative for rash.       Objective:   Physical Exam  Constitutional: Vital signs are normal. She appears well-developed and well-nourished. She is cooperative.  Non-toxic appearance. She does not appear ill. No distress.  HENT:  Head: Normocephalic.  Right Ear: Hearing, external ear and ear canal normal. Tympanic membrane is not erythematous, not retracted and not bulging. A middle ear effusion is present.  Left Ear: Hearing, external ear and ear canal normal. Tympanic membrane is not erythematous, not retracted and not bulging. A middle ear effusion is present.  Nose: Mucosal edema and rhinorrhea present. Right sinus exhibits no maxillary sinus tenderness and no frontal sinus tenderness. Left sinus exhibits no maxillary sinus tenderness and no frontal sinus tenderness.  Mouth/Throat: Uvula is midline, oropharynx is clear and moist and mucous membranes are normal.  Eyes: Conjunctivae, EOM and lids are normal. Pupils are equal, round, and reactive to light. Lids are everted and swept, no foreign bodies found.  Neck:  Trachea normal and normal range of motion. Neck supple. Carotid bruit is not present. No mass and no thyromegaly present.  Cardiovascular: Normal rate, regular rhythm, S1 normal, S2 normal, normal heart sounds, intact distal pulses and normal pulses.  Exam reveals no gallop and no friction rub.   No murmur heard. Pulmonary/Chest: Effort normal and breath sounds normal. Not tachypneic. No respiratory distress. She has no decreased breath sounds. She has no wheezes. She has no rhonchi. She has no rales.  Neurological: She is alert.  Skin: Skin is warm, dry and intact. No rash noted.  Psychiatric: Her speech is normal and behavior is normal. Judgment normal. Her mood appears not anxious. Cognition and memory are normal. She does not exhibit a depressed mood.          Assessment & Plan:

## 2013-06-23 NOTE — Patient Instructions (Signed)
Rest, push fluids. Tylenol or ibuprofen for sore throat. Mucinex DM for congestion and cough. Nasal saline spray 2-3 times a day.  Expect 5-7 more day of symptoms.

## 2013-06-23 NOTE — Assessment & Plan Note (Signed)
Symptomatic care.  Virla infeciton no indication for bacterial treatment.

## 2013-07-16 ENCOUNTER — Other Ambulatory Visit: Payer: Self-pay

## 2013-09-25 ENCOUNTER — Ambulatory Visit (INDEPENDENT_AMBULATORY_CARE_PROVIDER_SITE_OTHER): Payer: 59 | Admitting: Family Medicine

## 2013-09-25 ENCOUNTER — Encounter: Payer: Self-pay | Admitting: Family Medicine

## 2013-09-25 VITALS — BP 130/92 | HR 63 | Temp 97.4°F | Ht 66.5 in | Wt 193.5 lb

## 2013-09-25 DIAGNOSIS — R7309 Other abnormal glucose: Secondary | ICD-10-CM

## 2013-09-25 DIAGNOSIS — E119 Type 2 diabetes mellitus without complications: Secondary | ICD-10-CM | POA: Insufficient documentation

## 2013-09-25 DIAGNOSIS — E669 Obesity, unspecified: Secondary | ICD-10-CM

## 2013-09-25 DIAGNOSIS — I10 Essential (primary) hypertension: Secondary | ICD-10-CM

## 2013-09-25 DIAGNOSIS — E6609 Other obesity due to excess calories: Secondary | ICD-10-CM | POA: Insufficient documentation

## 2013-09-25 DIAGNOSIS — R739 Hyperglycemia, unspecified: Secondary | ICD-10-CM

## 2013-09-25 DIAGNOSIS — R7303 Prediabetes: Secondary | ICD-10-CM | POA: Insufficient documentation

## 2013-09-25 LAB — COMPREHENSIVE METABOLIC PANEL
ALK PHOS: 81 U/L (ref 39–117)
ALT: 26 U/L (ref 0–35)
AST: 26 U/L (ref 0–37)
Albumin: 3.9 g/dL (ref 3.5–5.2)
BILIRUBIN TOTAL: 0.7 mg/dL (ref 0.3–1.2)
BUN: 12 mg/dL (ref 6–23)
CALCIUM: 9.2 mg/dL (ref 8.4–10.5)
CHLORIDE: 103 meq/L (ref 96–112)
CO2: 29 meq/L (ref 19–32)
Creatinine, Ser: 0.9 mg/dL (ref 0.4–1.2)
GFR: 92.16 mL/min (ref >60.00)
GLUCOSE: 120 mg/dL — AB (ref 70–99)
Potassium: 3.5 mEq/L (ref 3.5–5.1)
SODIUM: 138 meq/L (ref 135–145)
Total Protein: 7.6 g/dL (ref 6.0–8.3)

## 2013-09-25 LAB — CBC WITH DIFFERENTIAL/PLATELET
Basophils Absolute: 0 10*3/uL (ref 0.0–0.1)
Basophils Relative: 0.6 % (ref 0.0–3.0)
Eosinophils Absolute: 0.2 10*3/uL (ref 0.0–0.7)
Eosinophils Relative: 2.5 % (ref 0.0–5.0)
HEMATOCRIT: 39.1 % (ref 36.0–46.0)
Hemoglobin: 13.3 g/dL (ref 12.0–15.0)
LYMPHS PCT: 28.3 % (ref 12.0–46.0)
Lymphs Abs: 2.1 10*3/uL (ref 0.7–4.0)
MCHC: 34 g/dL (ref 30.0–36.0)
MCV: 92.1 fl (ref 78.0–100.0)
Monocytes Absolute: 0.5 10*3/uL (ref 0.1–1.0)
Monocytes Relative: 7 % (ref 3.0–12.0)
Neutro Abs: 4.7 10*3/uL (ref 1.4–7.7)
Neutrophils Relative %: 61.6 % (ref 43.0–77.0)
Platelets: 232 10*3/uL (ref 150.0–400.0)
RBC: 4.24 Mil/uL (ref 3.87–5.11)
RDW: 14 % (ref 11.5–14.6)
WBC: 7.6 10*3/uL (ref 4.5–10.5)

## 2013-09-25 LAB — LIPID PANEL
CHOL/HDL RATIO: 4
Cholesterol: 162 mg/dL (ref 0–200)
HDL: 44.4 mg/dL (ref >39.00)
LDL CALC: 103 mg/dL — AB (ref 0–99)
TRIGLYCERIDES: 71 mg/dL (ref 0.0–149.0)
VLDL: 14.2 mg/dL (ref 0.0–40.0)

## 2013-09-25 LAB — HEMOGLOBIN A1C: Hgb A1c MFr Bld: 7 % — ABNORMAL HIGH (ref 4.6–6.5)

## 2013-09-25 LAB — TSH: TSH: 0.61 u[IU]/mL (ref 0.35–5.50)

## 2013-09-25 NOTE — Progress Notes (Signed)
Pre-visit discussion using our clinic review tool. No additional management support is needed unless otherwise documented below in the visit note.  

## 2013-09-25 NOTE — Progress Notes (Signed)
Subjective:    Patient ID: Lindsey Wright, female    DOB: 1966/07/21, 48 y.o.   MRN: [id number]  HPI Here for f/u of chronic health problems  Has been feeling ok   Not taking great care of herself  Does eat some fried foods / tries to minimize that  Loves sweet tea - lots   She is ready to make a lifestyle change  She can walk - outdoors and also at work (has and indoor space)   Wt is up 2 lb with bmi of 30   bp is stable today  No cp or palpitations or headaches or edema  No side effects to medicines = she did take her med  BP Readings from Last 3 Encounters:  09/25/13 130/92  06/23/13 106/70  09/15/12 128/82     Last labs a year ago-  Lab Results  Component Value Date   HGBA1C 6.7* 09/16/2012   Pt failed to f/u for this  Disc poss of DM and need for lifestyle change  ? If she was in denial   Flu vaccine - she does not want one - understands risks   Patient Active Problem List   Diagnosis Date Noted  . Hyperglycemia 09/25/2013  . Obesity 09/25/2013  . ANEMIA, IRON DEFICIENCY 09/27/2008  . HYPERTENSION 08/15/2007   Past Medical History  Diagnosis Date  . Hypertension   . Menorrhagia   . Overweight   . Anemia     iron deficient anemia   Past Surgical History  Procedure Laterality Date  . Cosmetic surgery    . Cholecystectomy    . Tubal ligation     History  Substance Use Topics  . Smoking status: Never Smoker   . Smokeless tobacco: Never Used  . Alcohol Use: No   Family History  Problem Relation Age of Onset  . Hypertension Mother   . Diabetes Mother   . Stroke Mother   . Alcohol abuse Father   . Cancer Father     lung ca  . Hypertension Father   . Hypertension Sister    Allergies  Allergen Reactions  . Ace Inhibitors    Current Outpatient Prescriptions on File Prior to Visit  Medication Sig Dispense Refill  . losartan (COZAAR) 50 MG tablet Take 1 tablet (50 mg total) by mouth daily.  30 tablet  11  . potassium chloride SA  (K-DUR,KLOR-CON) 20 MEQ tablet Take 2 tablets (40 mEq total) by mouth daily.  60 tablet  11  . triamterene-hydrochlorothiazide (MAXZIDE) 75-50 MG per tablet Take 0.5 tablets by mouth daily.  15 tablet  11   No current facility-administered medications on file prior to visit.    Review of Systems Review of Systems  Constitutional: Negative for fever, appetite change, fatigue and unexpected weight change.  Eyes: Negative for pain and visual disturbance.  Respiratory: Negative for cough and shortness of breath.   Cardiovascular: Negative for cp or palpitations    Gastrointestinal: Negative for nausea, diarrhea and constipation.  Genitourinary: Negative for urgency and frequency. no excessive thirst or urination  Skin: Negative for pallor or rash   Neurological: Negative for weakness, light-headedness, numbness and headaches.  Hematological: Negative for adenopathy. Does not bruise/bleed easily.  Psychiatric/Behavioral: Negative for dysphoric mood. The patient is not nervous/anxious.         Objective:   Physical Exam  Constitutional: She appears well-developed and well-nourished. No distress.  HENT:  Head: Normocephalic and atraumatic.  Mouth/Throat: Oropharynx is clear and  moist.  Eyes: Conjunctivae and EOM are normal. Pupils are equal, round, and reactive to light. No scleral icterus.  Neck: Normal range of motion. Neck supple. No JVD present. Carotid bruit is not present. No thyromegaly present.  Cardiovascular: Normal rate, regular rhythm, normal heart sounds and intact distal pulses.  Exam reveals no gallop.   Pulmonary/Chest: Effort normal and breath sounds normal. No respiratory distress. She has no wheezes. She exhibits no tenderness.  Abdominal: Soft. Bowel sounds are normal. She exhibits no distension, no abdominal bruit and no mass. There is no tenderness.  Musculoskeletal: She exhibits no edema and no tenderness.  Lymphadenopathy:    She has no cervical adenopathy.    Neurological: She displays normal reflexes. No cranial nerve deficit. Coordination normal.  Skin: Skin is warm and dry. No rash noted. No erythema. No pallor.  Psychiatric: She has a normal mood and affect.          Assessment & Plan:

## 2013-09-25 NOTE — Patient Instructions (Signed)
Work hard on weight loss  You are pre diabetic or possibly diabetic - you need to cut out sweet tea/ sweets and limit starches  Also exercise at least 5 days per week  Labs today Follow up in 3 months with labs prior   Exercise to Lose Weight Exercise and a healthy diet may help you lose weight. Your doctor may suggest specific exercises. EXERCISE IDEAS AND TIPS  Choose low-cost things you enjoy doing, such as walking, bicycling, or exercising to workout videos.  Take stairs instead of the elevator.  Walk during your lunch break.  Park your car further away from work or school.  Go to a gym or an exercise class.  Start with 5 to 10 minutes of exercise each day. Build up to 30 minutes of exercise 4 to 6 days a week.  Wear shoes with good support and comfortable clothes.  Stretch before and after working out.  Work out until you breathe harder and your heart beats faster.  Drink extra water when you exercise.  Do not do so much that you hurt yourself, feel dizzy, or get very short of breath. Exercises that burn about 150 calories:  Running 1  miles in 15 minutes.  Playing volleyball for 45 to 60 minutes.  Washing and waxing a car for 45 to 60 minutes.  Playing touch football for 45 minutes.  Walking 1  miles in 35 minutes.  Pushing a stroller 1  miles in 30 minutes.  Playing basketball for 30 minutes.  Raking leaves for 30 minutes.  Bicycling 5 miles in 30 minutes.  Walking 2 miles in 30 minutes.  Dancing for 30 minutes.  Shoveling snow for 15 minutes.  Swimming laps for 20 minutes.  Walking up stairs for 15 minutes.  Bicycling 4 miles in 15 minutes.  Gardening for 30 to 45 minutes.  Jumping rope for 15 minutes.  Washing windows or floors for 45 to 60 minutes. Document Released: 09/29/2010 Document Revised: 11/19/2011 Document Reviewed: 09/29/2010 [facility] Patient Information 2014 [place], [place].

## 2013-09-27 NOTE — Assessment & Plan Note (Signed)
Pt may be pre diabetic or diabetic  Lab today- A1C Long disc re: need for wt loss and low glycemic diet (reviewed )  Outlined a program of diet and exercise F/u in 3 mo

## 2013-09-27 NOTE — Assessment & Plan Note (Signed)
Discussed how this problem influences overall health and the risks it imposes  Reviewed plan for weight loss with lower calorie diet (via better food choices and also portion control or program like weight watchers) and exercise building up to or more than 30 minutes 5 days per week including some aerobic activity    

## 2013-09-27 NOTE — Assessment & Plan Note (Signed)
BP: 130/92 mmHg  bp in fair control at this time  No changes needed Disc lifstyle change with low sodium diet and exercise   Wt loss will be most important

## 2013-09-30 ENCOUNTER — Other Ambulatory Visit: Payer: Self-pay | Admitting: Family Medicine

## 2013-10-14 ENCOUNTER — Telehealth: Payer: Self-pay | Admitting: Family Medicine

## 2013-10-14 NOTE — Telephone Encounter (Signed)
Relevant patient education mailed to patient.  

## 2014-01-05 ENCOUNTER — Encounter: Payer: Self-pay | Admitting: Family Medicine

## 2014-01-05 ENCOUNTER — Ambulatory Visit (INDEPENDENT_AMBULATORY_CARE_PROVIDER_SITE_OTHER): Payer: 59 | Admitting: Family Medicine

## 2014-01-05 VITALS — BP 126/78 | HR 64 | Temp 97.9°F | Ht 66.5 in | Wt 174.5 lb

## 2014-01-05 DIAGNOSIS — I10 Essential (primary) hypertension: Secondary | ICD-10-CM

## 2014-01-05 DIAGNOSIS — R739 Hyperglycemia, unspecified: Secondary | ICD-10-CM

## 2014-01-05 DIAGNOSIS — R7309 Other abnormal glucose: Secondary | ICD-10-CM

## 2014-01-05 NOTE — Patient Instructions (Signed)
Keep up the good work with better diet and exercise and weight loss  Labs today to see what blood sugar is looking like

## 2014-01-05 NOTE — Progress Notes (Signed)
Pre visit review using our clinic review tool, if applicable. No additional management support is needed unless otherwise documented below in the visit note. 

## 2014-01-05 NOTE — Progress Notes (Signed)
Subjective:    Patient ID: Lindsey Wright, female    DOB: 09-16-1965, 48 y.o.   MRN: [id number]  HPI Here for f/u of chronic health problems  Wt is down 19 lb with bmi of 27 By her scale 20 lb  Eating healthy diet- fruits and vegetables and lean protein  No sweet tea and gave up added sugars  Walking for exercise - every day and goes zumba mon and wed  Makes sure to get 5 days in  She wants to keep loosing    Hyperglycemia -last a1c was 7.0 - putting her in DM range in Jan  This was a "wake up call" for her    bp is stable today  No cp or palpitations or headaches or edema  No side effects to medicines  BP Readings from Last 3 Encounters:  01/05/14 126/78  09/25/13 130/92  06/23/13 106/70       Chemistry      Component Value Date/Time   NA 138 09/25/2013 0949   K 3.5 09/25/2013 0949   CL 103 09/25/2013 0949   CO2 29 09/25/2013 0949   BUN 12 09/25/2013 0949   CREATININE 0.9 09/25/2013 0949      Component Value Date/Time   CALCIUM 9.2 09/25/2013 0949   ALKPHOS 81 09/25/2013 0949   AST 26 09/25/2013 0949   ALT 26 09/25/2013 0949   BILITOT 0.7 09/25/2013 0949      Lab Results  Component Value Date   CHOL 162 09/25/2013   HDL 44.40 09/25/2013   LDLCALC 103* 09/25/2013   TRIG 71.0 09/25/2013   CHOLHDL 4 09/25/2013     Patient Active Problem List   Diagnosis Date Noted  . Hyperglycemia 09/25/2013  . Obesity 09/25/2013  . ANEMIA, IRON DEFICIENCY 09/27/2008  . HYPERTENSION 08/15/2007   Past Medical History  Diagnosis Date  . Hypertension   . Menorrhagia   . Overweight   . Anemia     iron deficient anemia   Past Surgical History  Procedure Laterality Date  . Cosmetic surgery    . Cholecystectomy    . Tubal ligation     History  Substance Use Topics  . Smoking status: Never Smoker   . Smokeless tobacco: Never Used  . Alcohol Use: No   Family History  Problem Relation Age of Onset  . Hypertension Mother   . Diabetes Mother   . Stroke Mother   .  Alcohol abuse Father   . Cancer Father     lung ca  . Hypertension Father   . Hypertension Sister    Allergies  Allergen Reactions  . Ace Inhibitors    Current Outpatient Prescriptions on File Prior to Visit  Medication Sig Dispense Refill  . losartan (COZAAR) 50 MG tablet TAKE 1 TABLET (50 MG TOTAL) BY MOUTH DAILY.  30 tablet  5  . potassium chloride SA (K-DUR,KLOR-CON) 20 MEQ tablet Take 2 tablets (40 mEq total) by mouth daily.  60 tablet  11  . triamterene-hydrochlorothiazide (MAXZIDE) 75-50 MG per tablet TAKE 1/2 TABLET BY MOUTH DAILY  15 tablet  5   No current facility-administered medications on file prior to visit.    Review of Systems Review of Systems  Constitutional: Negative for fever, appetite change, fatigue and unexpected weight change.  Eyes: Negative for pain and visual disturbance.  Respiratory: Negative for cough and shortness of breath.   Cardiovascular: Negative for cp or palpitations    Gastrointestinal: Negative for nausea, diarrhea and constipation.  Genitourinary: Negative for urgency and frequency.  Skin: Negative for pallor or rash   Neurological: Negative for weakness, light-headedness, numbness and headaches.  Hematological: Negative for adenopathy. Does not bruise/bleed easily.  Psychiatric/Behavioral: Negative for dysphoric mood. The patient is not nervous/anxious.         Objective:   Physical Exam  Constitutional: She appears well-developed and well-nourished. No distress.  overwt and well app   HENT:  Head: Normocephalic and atraumatic.  Mouth/Throat: Oropharynx is clear and moist.  Eyes: Conjunctivae and EOM are normal. Pupils are equal, round, and reactive to light. Right eye exhibits no discharge. Left eye exhibits no discharge. No scleral icterus.  Neck: Normal range of motion. Neck supple. No JVD present. No thyromegaly present.  Cardiovascular: Normal rate, regular rhythm, normal heart sounds and intact distal pulses.  Exam reveals no  gallop.   Pulmonary/Chest: Effort normal and breath sounds normal. No respiratory distress. She has no wheezes. She has no rales.  Abdominal: Soft. Bowel sounds are normal. She exhibits no distension and no mass. There is no tenderness.  Musculoskeletal: She exhibits no edema.  Lymphadenopathy:    She has no cervical adenopathy.  Neurological: She is alert. She has normal reflexes. No cranial nerve deficit. She exhibits normal muscle tone. Coordination normal.  Skin: Skin is warm and dry. No rash noted. No erythema. No pallor.  Psychiatric: She has a normal mood and affect.          Assessment & Plan:

## 2014-01-06 LAB — GLUCOSE, RANDOM: Glucose, Bld: 77 mg/dL (ref 70–99)

## 2014-01-06 LAB — HEMOGLOBIN A1C: Hgb A1c MFr Bld: 5.4 % (ref 4.6–6.5)

## 2014-01-06 NOTE — Assessment & Plan Note (Signed)
bp in fair control at this time  BP Readings from Last 1 Encounters:  01/05/14 126/78   No changes needed Disc lifstyle change with low sodium diet and exercise  Improved with wt loss Lab todya

## 2014-01-06 NOTE — Assessment & Plan Note (Signed)
Expect imp with wt loss and lifestyle change  A1 C today  Enc to keep up the good work

## 2014-04-17 ENCOUNTER — Other Ambulatory Visit: Payer: Self-pay | Admitting: Family Medicine

## 2014-04-19 ENCOUNTER — Other Ambulatory Visit: Payer: Self-pay | Admitting: Family Medicine

## 2014-04-22 ENCOUNTER — Other Ambulatory Visit: Payer: Self-pay | Admitting: Family Medicine

## 2014-08-19 ENCOUNTER — Other Ambulatory Visit: Payer: Self-pay | Admitting: Family Medicine

## 2014-08-24 ENCOUNTER — Other Ambulatory Visit: Payer: Self-pay | Admitting: Family Medicine

## 2014-09-27 ENCOUNTER — Other Ambulatory Visit: Payer: Self-pay | Admitting: Family Medicine

## 2014-09-27 NOTE — Telephone Encounter (Signed)
Please schedule f/u or annual exam in May and refill until then thanks

## 2014-09-27 NOTE — Telephone Encounter (Signed)
Electronic refill request, no recent/future appt., please advise  

## 2014-09-29 NOTE — Telephone Encounter (Signed)
Pt left v/m requesting cb about refusal of losartan refill.

## 2014-09-29 NOTE — Telephone Encounter (Signed)
Pt had called and scheduled a f/u on 10/18/14, med refilled once until her appt

## 2014-10-07 ENCOUNTER — Other Ambulatory Visit: Payer: Self-pay | Admitting: Family Medicine

## 2014-10-18 ENCOUNTER — Ambulatory Visit (INDEPENDENT_AMBULATORY_CARE_PROVIDER_SITE_OTHER): Payer: 59 | Admitting: Family Medicine

## 2014-10-18 ENCOUNTER — Encounter: Payer: Self-pay | Admitting: Family Medicine

## 2014-10-18 VITALS — BP 130/88 | HR 62 | Temp 97.4°F | Ht 67.0 in | Wt 182.4 lb

## 2014-10-18 DIAGNOSIS — R739 Hyperglycemia, unspecified: Secondary | ICD-10-CM

## 2014-10-18 DIAGNOSIS — I10 Essential (primary) hypertension: Secondary | ICD-10-CM

## 2014-10-18 MED ORDER — LOSARTAN POTASSIUM 50 MG PO TABS: ORAL_TABLET | ORAL | Status: DC

## 2014-10-18 MED ORDER — POTASSIUM CHLORIDE CRYS ER 20 MEQ PO TBCR: EXTENDED_RELEASE_TABLET | ORAL | Status: DC

## 2014-10-18 MED ORDER — TRIAMTERENE-HCTZ 75-50 MG PO TABS: 0.5000 | ORAL_TABLET | Freq: Every day | ORAL | Status: DC

## 2014-10-18 NOTE — Assessment & Plan Note (Signed)
Lab Results  Component Value Date   HGBA1C 5.4 01/05/2014   Will check this today Continues to work on diet / low sugar and wt loss

## 2014-10-18 NOTE — Progress Notes (Signed)
Subjective:    Patient ID: Lindsey Wright, female    DOB: 1966-02-25, 49 y.o.   MRN: [id number]  HPI Here for f/u of chronic medical problems   Has been feeling good overall   Wt is up 8lb with bmi of 28  Getting back on track with diet  Struggling with exercise - less opportunity - walks outside when it is nice (does not have indoor alternative)   She is making an effort to drink more water  Due for labs today   bp is stable today  No cp or palpitations or headaches or edema  No side effects to medicines  BP Readings from Last 3 Encounters:  10/18/14 130/88  01/05/14 126/78  09/25/13 130/92     On losartan and maxzide K to replace that   Hx of hyperglycemia Lab Results  Component Value Date   HGBA1C 5.4 01/05/2014    Patient Active Problem List   Diagnosis Date Noted  . Hyperglycemia 09/25/2013  . ANEMIA, IRON DEFICIENCY 09/27/2008  . HYPERTENSION 08/15/2007   Past Medical History  Diagnosis Date  . Hypertension   . Menorrhagia   . Overweight(278.02)   . Anemia     iron deficient anemia   Past Surgical History  Procedure Laterality Date  . Cosmetic surgery    . Cholecystectomy    . Tubal ligation     History  Substance Use Topics  . Smoking status: Never Smoker   . Smokeless tobacco: Never Used  . Alcohol Use: No   Family History  Problem Relation Age of Onset  . Hypertension Mother   . Diabetes Mother   . Stroke Mother   . Alcohol abuse Father   . Cancer Father     lung ca  . Hypertension Father   . Hypertension Sister    Allergies  Allergen Reactions  . Ace Inhibitors    Current Outpatient Prescriptions on File Prior to Visit  Medication Sig Dispense Refill  . KLOR-CON M20 20 MEQ tablet TAKE 2 TABLETS BY MOUTH EVERY DAY FOLLOW-UP APPT REQUIRED FOR FURTHER REFILLS 60 tablet 1  . losartan (COZAAR) 50 MG tablet TAKE 1 TABLET (50 MG TOTAL) BY MOUTH DAILY. 30 tablet 0  . triamterene-hydrochlorothiazide (MAXZIDE) 75-50 MG per tablet  TAKE 1/2 TABLET BY MOUTH DAILY 15 tablet 0   No current facility-administered medications on file prior to visit.     Review of Systems Review of Systems  Constitutional: Negative for fever, appetite change, fatigue and unexpected weight change.  Eyes: Negative for pain and visual disturbance.  Respiratory: Negative for cough and shortness of breath.   Cardiovascular: Negative for cp or palpitations    Gastrointestinal: Negative for nausea, diarrhea and constipation.  Genitourinary: Negative for urgency and frequency.  Skin: Negative for pallor or rash   Neurological: Negative for weakness, light-headedness, numbness and headaches.  Hematological: Negative for adenopathy. Does not bruise/bleed easily.  Psychiatric/Behavioral: Negative for dysphoric mood. The patient is not nervous/anxious.         Objective:   Physical Exam  Constitutional: She appears well-developed and well-nourished. No distress.  HENT:  Head: Normocephalic and atraumatic.  Mouth/Throat: Oropharynx is clear and moist.  Eyes: Conjunctivae and EOM are normal. Pupils are equal, round, and reactive to light. No scleral icterus.  Neck: Normal range of motion. Neck supple. No JVD present. Carotid bruit is not present. No thyromegaly present.  Cardiovascular: Normal rate, regular rhythm, normal heart sounds and intact distal pulses.  Exam reveals  no gallop.   Pulmonary/Chest: Effort normal and breath sounds normal. No respiratory distress. She has no wheezes. She has no rales.  Abdominal: Soft. Bowel sounds are normal. She exhibits no distension and no mass. There is no tenderness.  Musculoskeletal: She exhibits tenderness. She exhibits no edema.  Lymphadenopathy:    She has no cervical adenopathy.  Neurological: She is alert. She has normal reflexes. No cranial nerve deficit. She exhibits normal muscle tone. Coordination normal.  Skin: Skin is warm and dry. No rash noted. No erythema. No pallor.  Psychiatric: She has  a normal mood and affect.          Assessment & Plan:   Problem List Items Addressed This Visit      Cardiovascular and Mediastinum   Essential hypertension - Primary    bp in fair control at this time  BP Readings from Last 1 Encounters:  10/18/14 130/88   No changes needed Disc lifstyle change with low sodium diet and exercise  Lab today  Disc low fat/sodium diet and plan for exercise       Relevant Medications   losartan (COZAAR) tablet   triamterene-hydrochlorothiazide (MAXZIDE) 75-50 MG per tablet   Other Relevant Orders   CBC with Differential/Platelet   Comprehensive metabolic panel   TSH   Lipid panel     Other   Hyperglycemia    Lab Results  Component Value Date   HGBA1C 5.4 01/05/2014   Will check this today Continues to work on diet / low sugar and wt loss       Relevant Orders   Hemoglobin A1c

## 2014-10-18 NOTE — Assessment & Plan Note (Signed)
bp in fair control at this time  BP Readings from Last 1 Encounters:  10/18/14 130/88   No changes needed Disc lifstyle change with low sodium diet and exercise  Lab today  Disc low fat/sodium diet and plan for exercise

## 2014-10-18 NOTE — Progress Notes (Signed)
Pre visit review using our clinic review tool, if applicable. No additional management support is needed unless otherwise documented below in the visit note. 

## 2014-10-18 NOTE — Patient Instructions (Signed)
Lab today  No change in medication  Take care of yourself - with healthy diet and exercise

## 2014-10-19 LAB — HEMOGLOBIN A1C: Hgb A1c MFr Bld: 6 % (ref 4.6–6.5)

## 2014-10-19 LAB — LIPID PANEL
CHOL/HDL RATIO: 3
Cholesterol: 147 mg/dL (ref 0–200)
HDL: 46.5 mg/dL (ref >39.00)
LDL Cholesterol: 84 mg/dL (ref 0–99)
NONHDL: 100.5
Triglycerides: 85 mg/dL (ref 0.0–149.0)
VLDL: 17 mg/dL (ref 0.0–40.0)

## 2014-10-19 LAB — CBC WITH DIFFERENTIAL/PLATELET
BASOS ABS: 0 10*3/uL (ref 0.0–0.1)
Basophils Relative: 0.5 % (ref 0.0–3.0)
Eosinophils Absolute: 0.2 10*3/uL (ref 0.0–0.7)
Eosinophils Relative: 1.8 % (ref 0.0–5.0)
HEMATOCRIT: 42.6 % (ref 36.0–46.0)
HEMOGLOBIN: 14.2 g/dL (ref 12.0–15.0)
LYMPHS PCT: 29.3 % (ref 12.0–46.0)
Lymphs Abs: 2.6 10*3/uL (ref 0.7–4.0)
MCHC: 33.4 g/dL (ref 30.0–36.0)
MCV: 91.4 fl (ref 78.0–100.0)
Monocytes Absolute: 0.5 10*3/uL (ref 0.1–1.0)
Monocytes Relative: 5.8 % (ref 3.0–12.0)
NEUTROS ABS: 5.5 10*3/uL (ref 1.4–7.7)
Neutrophils Relative %: 62.6 % (ref 43.0–77.0)
Platelets: 234 10*3/uL (ref 150.0–400.0)
RBC: 4.66 Mil/uL (ref 3.87–5.11)
RDW: 14.2 % (ref 11.5–15.5)
WBC: 8.8 10*3/uL (ref 4.0–10.5)

## 2014-10-19 LAB — COMPREHENSIVE METABOLIC PANEL
ALBUMIN: 4.4 g/dL (ref 3.5–5.2)
ALK PHOS: 115 U/L (ref 39–117)
ALT: 23 U/L (ref 0–35)
AST: 23 U/L (ref 0–37)
BILIRUBIN TOTAL: 0.5 mg/dL (ref 0.2–1.2)
BUN: 15 mg/dL (ref 6–23)
CO2: 30 mEq/L (ref 19–32)
Calcium: 9.7 mg/dL (ref 8.4–10.5)
Chloride: 102 mEq/L (ref 96–112)
Creatinine, Ser: 0.78 mg/dL (ref 0.40–1.20)
GFR: 101.31 mL/min (ref >60.00)
GLUCOSE: 83 mg/dL (ref 70–99)
Potassium: 3.7 mEq/L (ref 3.5–5.1)
Sodium: 139 mEq/L (ref 135–145)
Total Protein: 8 g/dL (ref 6.0–8.3)

## 2014-10-19 LAB — TSH: TSH: 0.7 u[IU]/mL (ref 0.35–4.50)

## 2015-01-18 ENCOUNTER — Other Ambulatory Visit: Payer: Self-pay | Admitting: Obstetrics & Gynecology

## 2015-01-18 DIAGNOSIS — R928 Other abnormal and inconclusive findings on diagnostic imaging of breast: Secondary | ICD-10-CM

## 2015-01-25 ENCOUNTER — Other Ambulatory Visit: Payer: Self-pay | Admitting: Obstetrics & Gynecology

## 2015-01-25 ENCOUNTER — Ambulatory Visit
Admission: RE | Admit: 2015-01-25 | Discharge: 2015-01-25 | Disposition: A | Discharge status: Home / Self Care | Payer: 59 | Source: Ambulatory Visit | Attending: Obstetrics & Gynecology | Admitting: Obstetrics & Gynecology

## 2015-01-25 DIAGNOSIS — R928 Other abnormal and inconclusive findings on diagnostic imaging of breast: Secondary | ICD-10-CM

## 2015-01-25 DIAGNOSIS — N6310 Unspecified lump in the right breast, unspecified quadrant: Secondary | ICD-10-CM

## 2015-01-27 ENCOUNTER — Telehealth: Payer: Self-pay | Admitting: Family Medicine

## 2015-01-27 ENCOUNTER — Other Ambulatory Visit: Payer: Self-pay | Admitting: Obstetrics & Gynecology

## 2015-01-27 ENCOUNTER — Encounter (HOSPITAL_COMMUNITY): Payer: Self-pay | Admitting: Emergency Medicine

## 2015-01-27 ENCOUNTER — Emergency Department (HOSPITAL_COMMUNITY)
Admission: EM | Admit: 2015-01-27 | Discharge: 2015-01-27 | Disposition: A | Discharge status: Home / Self Care | Payer: 59 | Source: Home / Self Care | Attending: Emergency Medicine | Admitting: Emergency Medicine

## 2015-01-27 DIAGNOSIS — G510 Bell's palsy: Secondary | ICD-10-CM

## 2015-01-27 DIAGNOSIS — N6310 Unspecified lump in the right breast, unspecified quadrant: Secondary | ICD-10-CM

## 2015-01-27 MED ORDER — PREDNISONE 20 MG PO TABS: 60.0000 mg | ORAL_TABLET | Freq: Every day | ORAL | Status: DC

## 2015-01-27 NOTE — Discharge Instructions (Signed)
You have Bell's palsy. Take prednisone 60 mg daily for 1 week. Get a lubricating eyedrop at the drug store. Use that every hour while you are week. Get a gel eyedrop to use at night. Get some paper tape at the pharmacy and use this to help keep your eye closed at night. Follow-up with your regular doctor in 2-3 weeks for recheck.

## 2015-01-27 NOTE — ED Provider Notes (Signed)
CSN: [id number]     Arrival date & time 01/27/15  1838 History   First MD Initiated Contact with Patient 01/27/15 1848     Chief Complaint  Patient presents with  . Facial Swelling  . Numbness   (Consider location/radiation/quality/duration/timing/severity/associated sxs/prior Treatment) HPI She is a 49 year old woman here for evaluation of facial droop. She states when she woke up this morning the left side of her face was drooping. She also reports a numb sensation to the left side of her face. For the 2-3 days prior to this she felt a tugging sensation on the pinna of her left ear. She denies any headaches, changes in her vision, chest pain, shortness of breath. No numbness, tingling, weakness in any of her extremities. She has hypertension. She is a nonsmoker. She reports a history of Bell's palsy in her 58s.  Past Medical History  Diagnosis Date  . Hypertension   . Menorrhagia   . Overweight(278.02)   . Anemia     iron deficient anemia   Past Surgical History  Procedure Laterality Date  . Cosmetic surgery    . Cholecystectomy    . Tubal ligation     Family History  Problem Relation Age of Onset  . Hypertension Mother   . Diabetes Mother   . Stroke Mother   . Alcohol abuse Father   . Cancer Father     lung ca  . Hypertension Father   . Hypertension Sister    History  Substance Use Topics  . Smoking status: Never Smoker   . Smokeless tobacco: Never Used  . Alcohol Use: No   OB History    No data available     Review of Systems As in history of present illness Allergies  Ace inhibitors  Home Medications   Prior to Admission medications   Medication Sig Start Date End Date Taking? Authorizing Provider  losartan (COZAAR) 50 MG tablet TAKE 1 TABLET (50 MG TOTAL) BY MOUTH DAILY. 10/18/14  Yes [name], MD  potassium chloride SA (KLOR-CON M20) 20 MEQ tablet Take 2 pills by mouth every day 10/18/14  Yes [name], MD  triamterene-hydrochlorothiazide  (MAXZIDE) 75-50 MG per tablet Take 0.5 tablets by mouth daily. 10/18/14  Yes [name], MD  predniSONE (DELTASONE) 20 MG tablet Take 3 tablets (60 mg total) by mouth daily. For 7 days 01/27/15   Melony Overly, MD   BP 130/83 mmHg  Pulse 70  Temp(Src) 98.3 F (36.8 C) (Oral)  Resp 17  SpO2 98% Physical Exam  Constitutional: She is oriented to person, place, and time. She appears well-developed and well-nourished. No distress.  Eyes: Conjunctivae and EOM are normal. Pupils are equal, round, and reactive to light.  Cardiovascular: Normal rate, regular rhythm and normal heart sounds.   No murmur heard. Pulmonary/Chest: Effort normal.  Neurological: She is alert and oriented to person, place, and time. She exhibits normal muscle tone. Coordination normal.  Facial nerve palsy with involvement of forehead.  She is unable to completely close her eye. Loss of nasolabial fold. Pupils are equal and reactive. Extraocular movements are intact. All other cranial nerves are intact.    ED Course  Procedures (including critical care time) Labs Review Labs Reviewed - No data to display  Imaging Review No results found.   MDM   1. Bell's palsy    Grade 5 Bell's palsy. Discussed measures take keep the eye lubricated. We'll treat with 7 days of prednisone. Follow-up with  PCP in a few weeks for recheck.    Melony Overly, MD 01/27/15 [phone]

## 2015-01-27 NOTE — Telephone Encounter (Signed)
[facility] Patient Name: Lindsey Wright Gender: Female DOB: 1965/09/14 Age: 49 Y 26 M 7 D Return Phone Number: [phone] (Primary) Address: City/State/Zip: [place] Client [facility] - Client Client Site [facility], [place] Contact Type Call Call Type Triage / Clinical Relationship To Patient Self Appointment Disposition EMR Appointment Not Necessary Info pasted into Epic Yes Return Phone Number [phone] (Primary) Chief Complaint NUMBNESS - sudden on one side of face or body Initial Comment caller states she has numbness on her left side of her mouth , also is c/o her ear throbbing - thinks she may have bells palsy PreDisposition Did not know what to do Nurse Assessment Nurse: Mechele Dawley, RN, Amy Date/Time ([place] Time): 01/27/2015 10:25:48 AM Confirm and document reason for call. If symptomatic, describe symptoms. ---CALLER STATES THAT SHE HAD BELLS PALSY WHEN SHE IS 49 YEARS OLD. SHE IS HAVING SOME NUMBNESS ON HER LEFT SIDE OF HER MOUTH. HER EAR IS ALSO THROBBING. SHE STATES HER SMILE IS CROOKED. WHEN SHE CLOSES HER EYES. SHE STATES THE LEFT EYE IS NOT CLOSING ALL THE WAY. Has the patient traveled out of the country within the last 30 days? ---Not Applicable Does the patient require triage? ---Yes Related visit to physician within the last 2 weeks? ---No Does the PT have any chronic conditions? (i.e. diabetes, asthma, etc.) ---Yes List chronic conditions. ---HYPERTENSION Did the patient indicate they were pregnant? ---No Guidelines Guideline Title Affirmed Question Affirmed Notes Nurse Date/Time (Eastern Time) Neurologic Deficit Bell's palsy suspected (i.e., weakness on only one side of the face, developing over hours to days, no other symptoms) Mechele Dawley, RN, Amy 01/27/2015 10:28:14 AM Disp. Time Eilene Ghazi  Time) Disposition Final User 01/27/2015 10:19:26 AM Send to Urgent Queue [place], Audrea Muscat PLEASE NOTE: All timestamps contained within this report are represented as [place] Standard Time. CONFIDENTIALTY NOTICE: This fax transmission is intended only for the addressee. It contains information that is legally privileged, confidential or otherwise protected from use or disclosure. If you are not the intended recipient, you are strictly prohibited from reviewing, disclosing, copying using or disseminating any of this information or taking any action in reliance on or regarding this information. If you have received this fax in error, please notify us immediately by telephone so that we can arrange for its return to [place]. Phone: [phone], Toll-Free: [phone], Fax: [phone] Page: 2 of 2 Call Id: [id number] 01/27/2015 10:29:24 AM Go to ED Now (or PCP triage) Yes Mechele Dawley, RN, Amy Caller Understands: Yes Disagree/Comply: Comply Care Advice Given Per Guideline GO TO ED NOW (OR PCP TRIAGE): CARE ADVICE given per Neurologic Deficit (Adult) guideline. After Care Instructions Given Call Event Type User Date / Time Description Comments User: Susanne Borders, RN Date/Time Eilene Ghazi Time): 01/27/2015 10:31:57 AM CALLER DOES NOT KNOW WHERE SHE WILL GO TO THE ED AT. SHE IS CALLING HER HUSBAND. UNDECIDED AT THIS TIME. Referrals GO TO FACILITY UNDECIDED

## 2015-01-27 NOTE — Telephone Encounter (Signed)
plz ensure pt went to ER for evaluation.

## 2015-01-27 NOTE — Telephone Encounter (Signed)
Spoke with patient. She did not go to ER or [facility]. She went to work instead. She said symptoms are the same as Bells Palsy. No new numbness or weakness; no new symptoms. Encouraged eval. She said she would think about it.

## 2015-01-27 NOTE — ED Notes (Signed)
Pt reports that she woke up this morning with left facial swelling and numbness.  Pt is unable to close her left eye completely and she is unable to smile on the left side.  She is able to stick her tongue out normally.  She denies any other concerning symptoms, including no heart palpitations, no chest pain, no numbness or tingling anywhere else, and no headache.  Pt denies any pain other than a slight pain on her left ear she states "It feels like someone is tugging on it."

## 2015-01-28 NOTE — Telephone Encounter (Signed)
Agree with advisement-symptoms do sound consistent with Bell's palsy , if she develops other symptoms like speech difficulty or body weakness/numbness this could indicate stroke  If she does not week emergent care please do follow up with [place] when able

## 2015-01-31 NOTE — Telephone Encounter (Signed)
Pt did go to UC and the dx. with Bells Palsy, and started her on Prednisone. Pt declined to schedule a f/u appt with Dr. Glori Bickers due to schedule, I advise pt if sxs worsen or she develops any new sxs to let us know. Pt said she is stable right now

## 2015-02-01 ENCOUNTER — Ambulatory Visit
Admission: RE | Admit: 2015-02-01 | Discharge: 2015-02-01 | Disposition: A | Discharge status: Home / Self Care | Payer: 59 | Source: Ambulatory Visit | Attending: Obstetrics & Gynecology | Admitting: Obstetrics & Gynecology

## 2015-02-01 DIAGNOSIS — N6310 Unspecified lump in the right breast, unspecified quadrant: Secondary | ICD-10-CM

## 2015-02-01 HISTORY — PX: BREAST BIOPSY: SHX20

## 2015-06-28 ENCOUNTER — Other Ambulatory Visit: Payer: Self-pay | Admitting: Obstetrics & Gynecology

## 2015-06-28 DIAGNOSIS — N6310 Unspecified lump in the right breast, unspecified quadrant: Secondary | ICD-10-CM

## 2015-08-08 ENCOUNTER — Other Ambulatory Visit: Payer: Self-pay | Admitting: Obstetrics & Gynecology

## 2015-08-08 ENCOUNTER — Ambulatory Visit
Admission: RE | Admit: 2015-08-08 | Discharge: 2015-08-08 | Disposition: A | Discharge status: Home / Self Care | Payer: 59 | Source: Ambulatory Visit | Attending: Obstetrics & Gynecology | Admitting: Obstetrics & Gynecology

## 2015-08-08 DIAGNOSIS — N6310 Unspecified lump in the right breast, unspecified quadrant: Secondary | ICD-10-CM

## 2015-10-25 ENCOUNTER — Other Ambulatory Visit: Payer: Self-pay | Admitting: Family Medicine

## 2015-10-31 ENCOUNTER — Encounter: Payer: Self-pay | Admitting: Family Medicine

## 2015-10-31 ENCOUNTER — Ambulatory Visit (INDEPENDENT_AMBULATORY_CARE_PROVIDER_SITE_OTHER): Payer: 59 | Admitting: Family Medicine

## 2015-10-31 VITALS — BP 128/80 | HR 59 | Temp 98.3°F | Ht 67.0 in | Wt 187.8 lb

## 2015-10-31 DIAGNOSIS — R739 Hyperglycemia, unspecified: Secondary | ICD-10-CM | POA: Diagnosis not present

## 2015-10-31 DIAGNOSIS — I10 Essential (primary) hypertension: Secondary | ICD-10-CM | POA: Diagnosis not present

## 2015-10-31 LAB — COMPREHENSIVE METABOLIC PANEL
ALBUMIN: 4.2 g/dL (ref 3.5–5.2)
ALK PHOS: 91 U/L (ref 39–117)
ALT: 18 U/L (ref 0–35)
AST: 20 U/L (ref 0–37)
BILIRUBIN TOTAL: 0.5 mg/dL (ref 0.2–1.2)
BUN: 13 mg/dL (ref 6–23)
CALCIUM: 9.4 mg/dL (ref 8.4–10.5)
CO2: 29 mEq/L (ref 19–32)
CREATININE: 0.82 mg/dL (ref 0.40–1.20)
Chloride: 103 mEq/L (ref 96–112)
GFR: 95.22 mL/min (ref >60.00)
Glucose, Bld: 101 mg/dL — ABNORMAL HIGH (ref 70–99)
Potassium: 3.7 mEq/L (ref 3.5–5.1)
SODIUM: 139 meq/L (ref 135–145)
TOTAL PROTEIN: 7.3 g/dL (ref 6.0–8.3)

## 2015-10-31 LAB — HEMOGLOBIN A1C: HEMOGLOBIN A1C: 5.9 % (ref 4.6–6.5)

## 2015-10-31 LAB — CBC WITH DIFFERENTIAL/PLATELET
BASOS ABS: 0 10*3/uL (ref 0.0–0.1)
Basophils Relative: 0.4 % (ref 0.0–3.0)
EOS PCT: 1.9 % (ref 0.0–5.0)
Eosinophils Absolute: 0.2 10*3/uL (ref 0.0–0.7)
HCT: 40.5 % (ref 36.0–46.0)
HEMOGLOBIN: 13.7 g/dL (ref 12.0–15.0)
LYMPHS ABS: 2.1 10*3/uL (ref 0.7–4.0)
Lymphocytes Relative: 23.8 % (ref 12.0–46.0)
MCHC: 33.8 g/dL (ref 30.0–36.0)
MCV: 90.6 fl (ref 78.0–100.0)
MONO ABS: 0.6 10*3/uL (ref 0.1–1.0)
MONOS PCT: 6.4 % (ref 3.0–12.0)
NEUTROS PCT: 67.5 % (ref 43.0–77.0)
Neutro Abs: 5.9 10*3/uL (ref 1.4–7.7)
Platelets: 230 10*3/uL (ref 150.0–400.0)
RBC: 4.47 Mil/uL (ref 3.87–5.11)
RDW: 14.2 % (ref 11.5–15.5)
WBC: 8.8 10*3/uL (ref 4.0–10.5)

## 2015-10-31 LAB — LIPID PANEL
CHOL/HDL RATIO: 3
CHOLESTEROL: 128 mg/dL (ref 0–200)
HDL: 40.8 mg/dL (ref >39.00)
LDL Cholesterol: 74 mg/dL (ref 0–99)
NonHDL: 87.15
TRIGLYCERIDES: 65 mg/dL (ref 0.0–149.0)
VLDL: 13 mg/dL (ref 0.0–40.0)

## 2015-10-31 LAB — TSH: TSH: 1.03 u[IU]/mL (ref 0.35–4.50)

## 2015-10-31 MED ORDER — POTASSIUM CHLORIDE CRYS ER 20 MEQ PO TBCR: 40.0000 meq | EXTENDED_RELEASE_TABLET | Freq: Every day | ORAL | Status: DC

## 2015-10-31 MED ORDER — TRIAMTERENE-HCTZ 75-50 MG PO TABS: 0.5000 | ORAL_TABLET | Freq: Every day | ORAL | Status: DC

## 2015-10-31 MED ORDER — LOSARTAN POTASSIUM 50 MG PO TABS: ORAL_TABLET | ORAL | Status: DC

## 2015-10-31 NOTE — Patient Instructions (Signed)
Work on [employer] (reduced portions) and exercise for weight loss and also to avoid diabetes  Try to limit carbs to smaller portions - bread/pasta/potato/rice/fruit and starchy vegetables Labs today

## 2015-10-31 NOTE — Progress Notes (Signed)
Subjective:    Patient ID: Lindsey Wright, female    DOB: 12-17-1965, 50 y.o.   MRN: [id number]  HPI Here for f/u of chronic health problems   Wt is up 5 lb with bmi of 29 Is exercising - she has been walking 6 days per week- for the past 2 months  Thinks she is "eating ok" She really wants to loose weight  Making healthy choices    Last year had bell's palsy Also neg breast bx  A lot of doctor visits   bp is up on first check today  No cp or palpitations or headaches or edema  No side effects to medicines  BP Readings from Last 3 Encounters:  10/31/15 136/94  01/27/15 130/83  10/18/14 130/88     Labs are due  Hx of hyperglycemia Lab Results  Component Value Date   HGBA1C 6.0 10/18/2014  she loves sugar  No sweets and no sweet tea  Does eat a fair amt of fruit   Hx of anemia-resolved last check  Lab Results  Component Value Date   WBC 8.8 10/18/2014   HGB 14.2 10/18/2014   HCT 42.6 10/18/2014   MCV 91.4 10/18/2014   PLT 234.0 10/18/2014    Patient Active Problem List   Diagnosis Date Noted  . Hyperglycemia 09/25/2013  . ANEMIA, IRON DEFICIENCY 09/27/2008  . Essential hypertension 08/15/2007   Past Medical History  Diagnosis Date  . Hypertension   . Menorrhagia   . Overweight(278.02)   . Anemia     iron deficient anemia   Past Surgical History  Procedure Laterality Date  . Cosmetic surgery    . Cholecystectomy    . Tubal ligation     Social History  Substance Use Topics  . Smoking status: Never Smoker   . Smokeless tobacco: Never Used  . Alcohol Use: No   Family History  Problem Relation Age of Onset  . Hypertension Mother   . Diabetes Mother   . Stroke Mother   . Alcohol abuse Father   . Cancer Father     lung ca  . Hypertension Father   . Hypertension Sister    Allergies  Allergen Reactions  . Ace Inhibitors    No current outpatient prescriptions on file prior to visit.   No current facility-administered medications on  file prior to visit.    Review of Systems Review of Systems  Constitutional: Negative for fever, appetite change, and unexpected weight change.  Eyes: Negative for pain and visual disturbance.  Respiratory: Negative for cough and shortness of breath.   Cardiovascular: Negative for cp or palpitations    Gastrointestinal: Negative for nausea, diarrhea and constipation.  Genitourinary: Negative for urgency and frequency.  Skin: Negative for pallor or rash   Neurological: Negative for weakness, light-headedness, numbness and headaches.  Hematological: Negative for adenopathy. Does not bruise/bleed easily.  Psychiatric/Behavioral: Negative for dysphoric mood. The patient is not nervous/anxious.  pos for stressors and a busy yea r       Objective:   Physical Exam  Constitutional: She appears well-developed and well-nourished. No distress.  overwt and well app  HENT:  Head: Normocephalic and atraumatic.  Mouth/Throat: Oropharynx is clear and moist.  Eyes: Conjunctivae and EOM are normal. Pupils are equal, round, and reactive to light.  Neck: Normal range of motion. Neck supple. No JVD present. Carotid bruit is not present. No thyromegaly present.  Prominent fatty deposits on ant neck but not thyromegally  Cardiovascular: Normal  rate, regular rhythm, normal heart sounds and intact distal pulses.  Exam reveals no gallop.   Pulmonary/Chest: Effort normal and breath sounds normal. No respiratory distress. She has no wheezes. She has no rales.  No crackles  Abdominal: Soft. Bowel sounds are normal. She exhibits no distension, no abdominal bruit and no mass. There is no tenderness.  Musculoskeletal: She exhibits no edema.  Lymphadenopathy:    She has no cervical adenopathy.  Neurological: She is alert. She has normal reflexes. No cranial nerve deficit. She exhibits normal muscle tone. Coordination normal.  Skin: Skin is warm and dry. No rash noted.  Psychiatric: She has a normal mood and  affect.          Assessment & Plan:   Problem List Items Addressed This Visit      Cardiovascular and Mediastinum   Essential hypertension - Primary    bp in fair control at this time -better on 2nd check  BP Readings from Last 1 Encounters:  10/31/15 128/80   No changes needed Disc lifstyle change with low sodium diet and exercise  Lab today Enc wt loss        Relevant Medications   losartan (COZAAR) 50 MG tablet   triamterene-hydrochlorothiazide (MAXZIDE) 75-50 MG tablet   Other Relevant Orders   CBC with Differential/Platelet (Completed)   Comprehensive metabolic panel (Completed)   TSH (Completed)   Lipid panel (Completed)     Other   Hyperglycemia    Due for A1C Disc need for low glycemic diet and wt loss for DM prevention  Voiced understanding         Relevant Orders   Hemoglobin A1c (Completed)

## 2015-10-31 NOTE — Assessment & Plan Note (Signed)
Due for A1C Disc need for low glycemic diet and wt loss for DM prevention  Voiced understanding

## 2015-10-31 NOTE — Assessment & Plan Note (Signed)
bp in fair control at this time -better on 2nd check  BP Readings from Last 1 Encounters:  10/31/15 128/80   No changes needed Disc lifstyle change with low sodium diet and exercise  Lab today Enc wt loss

## 2015-10-31 NOTE — Progress Notes (Signed)
Pre visit review using our clinic review tool, if applicable. No additional management support is needed unless otherwise documented below in the visit note. 

## 2015-11-07 ENCOUNTER — Other Ambulatory Visit: Payer: Self-pay | Admitting: Family Medicine

## 2015-11-28 ENCOUNTER — Other Ambulatory Visit: Payer: Self-pay | Admitting: Obstetrics & Gynecology

## 2016-01-03 ENCOUNTER — Other Ambulatory Visit: Payer: Self-pay

## 2016-01-03 DIAGNOSIS — Z1231 Encounter for screening mammogram for malignant neoplasm of breast: Secondary | ICD-10-CM

## 2016-01-09 ENCOUNTER — Other Ambulatory Visit: Payer: Self-pay | Admitting: Obstetrics & Gynecology

## 2016-01-09 LAB — HM PAP SMEAR

## 2016-01-10 ENCOUNTER — Encounter: Payer: Self-pay | Admitting: Family Medicine

## 2016-01-10 LAB — CYTOLOGY - PAP

## 2016-01-16 ENCOUNTER — Ambulatory Visit: Payer: 59

## 2016-01-24 ENCOUNTER — Ambulatory Visit
Admission: RE | Admit: 2016-01-24 | Discharge: 2016-01-24 | Disposition: A | Discharge status: Home / Self Care | Payer: 59 | Source: Ambulatory Visit

## 2016-01-24 DIAGNOSIS — Z1231 Encounter for screening mammogram for malignant neoplasm of breast: Secondary | ICD-10-CM

## 2016-08-23 ENCOUNTER — Ambulatory Visit (HOSPITAL_COMMUNITY)
Admission: EM | Admit: 2016-08-23 | Discharge: 2016-08-23 | Disposition: A | Discharge status: Home / Self Care | Payer: 59 | Attending: Emergency Medicine | Admitting: Emergency Medicine

## 2016-08-23 ENCOUNTER — Encounter (HOSPITAL_COMMUNITY): Payer: Self-pay | Admitting: Family Medicine

## 2016-08-23 DIAGNOSIS — J069 Acute upper respiratory infection, unspecified: Secondary | ICD-10-CM

## 2016-08-23 DIAGNOSIS — R0982 Postnasal drip: Secondary | ICD-10-CM

## 2016-08-23 DIAGNOSIS — J9801 Acute bronchospasm: Secondary | ICD-10-CM | POA: Diagnosis not present

## 2016-08-23 MED ORDER — PREDNISONE 50 MG PO TABS: ORAL_TABLET | ORAL | 0 refills | Status: DC

## 2016-08-23 MED ORDER — ALBUTEROL SULFATE HFA 108 (90 BASE) MCG/ACT IN AERS: 2.0000 | INHALATION_SPRAY | RESPIRATORY_TRACT | 0 refills | Status: DC | PRN

## 2016-08-23 NOTE — ED Provider Notes (Signed)
CSN: [id number]     Arrival date & time 08/23/16  [id number] History   First MD Initiated Contact with Patient 08/23/16 1036     Chief Complaint  Patient presents with  . Cough  . Nasal Congestion   (Consider location/radiation/quality/duration/timing/severity/associated sxs/prior Treatment) 50 year old female complaining of a cough for one week that is getting worse. She also has runny nose, nasal stuffiness. Denies earache, sore throat or PND.      Past Medical History:  Diagnosis Date  . Anemia    iron deficient anemia  . Hypertension   . Menorrhagia   . Overweight(278.02)    Past Surgical History:  Procedure Laterality Date  . CHOLECYSTECTOMY    . COSMETIC SURGERY    . TUBAL LIGATION     Family History  Problem Relation Age of Onset  . Hypertension Mother   . Diabetes Mother   . Stroke Mother   . Alcohol abuse Father   . Cancer Father     lung ca  . Hypertension Father   . Hypertension Sister    Social History  Substance Use Topics  . Smoking status: Never Smoker  . Smokeless tobacco: Never Used  . Alcohol use No   OB History    No data available     Review of Systems  Constitutional: Positive for activity change. Negative for appetite change, chills, fatigue and fever.  HENT: Positive for congestion, postnasal drip and rhinorrhea. Negative for ear pain, facial swelling and sore throat.   Eyes: Negative.   Respiratory: Positive for cough. Negative for shortness of breath.   Cardiovascular: Negative.   Gastrointestinal: Negative.   Musculoskeletal: Negative for neck pain and neck stiffness.  Skin: Negative for pallor and rash.  Neurological: Negative.     Allergies  Ace inhibitors  Home Medications   Prior to Admission medications   Medication Sig Start Date End Date Taking? Authorizing Provider  albuterol (PROVENTIL HFA;VENTOLIN HFA) 108 (90 Base) MCG/ACT inhaler Inhale 2 puffs into the lungs every 4 (four) hours as needed for wheezing or shortness of  breath. 08/23/16   Janne Napoleon, NP  losartan (COZAAR) 50 MG tablet TAKE 1 TABLET (50 MG TOTAL) BY MOUTH DAILY. 10/31/15   [name], MD  potassium chloride SA (KLOR-CON M20) 20 MEQ tablet Take 2 tablets (40 mEq total) by mouth daily. 10/31/15   [name], MD  predniSONE (DELTASONE) 50 MG tablet 1 tab po daily for 6 days. Take with food. 08/23/16   Janne Napoleon, NP  triamterene-hydrochlorothiazide (MAXZIDE) 75-50 MG tablet TAKE 1/2 TABLETS BY MOUTH DAILY. 11/07/15   [name], MD   Meds Ordered and Administered this Visit  Medications - No data to display  BP 148/82 (BP Location: Left Arm)   Pulse 94   Temp 99.4 F (37.4 C) (Oral)   Resp 12   SpO2 95%  No data found.   Physical Exam  Constitutional: She is oriented to person, place, and time. She appears well-developed and well-nourished. No distress.  HENT:  Right Ear: External ear normal.  Left Ear: External ear normal.  Mouth/Throat: No oropharyngeal exudate.  Bilateral TMs with minor retraction otherwise normal. Oropharynx difficult to see due to patient's retraction of the time however the uvula is without edema in the upper oropharynx with clear PND.  Eyes: EOM are normal. Pupils are equal, round, and reactive to light.  Neck: Normal range of motion. Neck supple.  Cardiovascular: Normal rate, regular rhythm, normal heart sounds and intact  distal pulses.   Pulmonary/Chest: Effort normal. No respiratory distress. She has wheezes.  Tidal volume is relatively clear. Forced expiration and cough with end-expiratory wheezing and coarseness. Mildly prolonged expiratory phase.  Musculoskeletal: Normal range of motion. She exhibits no edema.  Lymphadenopathy:    She has no cervical adenopathy.  Neurological: She is alert and oriented to person, place, and time.  Skin: Skin is warm and dry.  Psychiatric: She has a normal mood and affect.  Nursing note and vitals reviewed.   Urgent Care Course   Clinical Course      Procedures (including critical care time)  Labs Review Labs Reviewed - No data to display  Imaging Review No results found.   Visual Acuity Review  Right Eye Distance:   Left Eye Distance:   Bilateral Distance:    Right Eye Near:   Left Eye Near:    Bilateral Near:         MDM   1. Acute upper respiratory infection   2. Cough due to bronchospasm   3. PND (post-nasal drip)    Your cough is due primarily to bronchospasm or wheezing. His comp located by some drainage in the back of the throat. Take the medication as listed below and use the albuterol inhaler as directed for wheezing and cough. Be sure to drink plenty of fluids and stay well-hydrated. Sudafed PE 10 mg every 4 to 6 hours as needed for congestion Allegra or Zyrtec daily as needed for drainage and runny nose. For stronger antihistamine may take Chlor-Trimeton 2 to 4 mg every 4 to 6 hours, may cause drowsiness. Saline nasal spray used frequently. Ibuprofen 600 mg every 6 hours as needed for pain, discomfort or fever. Drink plenty of fluids and stay well-hydrated. Meds ordered this encounter  Medications  . albuterol (PROVENTIL HFA;VENTOLIN HFA) 108 (90 Base) MCG/ACT inhaler    Sig: Inhale 2 puffs into the lungs every 4 (four) hours as needed for wheezing or shortness of breath.    Dispense:  1 Inhaler    Refill:  0    Order Specific Question:   Supervising Provider    Answer:   Melony Overly [id number]  . predniSONE (DELTASONE) 50 MG tablet    Sig: 1 tab po daily for 6 days. Take with food.    Dispense:  6 tablet    Refill:  0    Order Specific Question:   Supervising Provider    Answer:   Melony Overly [id number]       Janne Napoleon, NP 08/23/16 1052

## 2016-08-23 NOTE — Discharge Instructions (Signed)
Your cough is due primarily to bronchospasm or wheezing. His comp located by some drainage in the back of the throat. Take the medication as listed below and use the albuterol inhaler as directed for wheezing and cough. Be sure to drink plenty of fluids and stay well-hydrated. Sudafed PE 10 mg every 4 to 6 hours as needed for congestion Allegra or Zyrtec daily as needed for drainage and runny nose. For stronger antihistamine may take Chlor-Trimeton 2 to 4 mg every 4 to 6 hours, may cause drowsiness. Saline nasal spray used frequently. Ibuprofen 600 mg every 6 hours as needed for pain, discomfort or fever. Drink plenty of fluids and stay well-hydrated.

## 2016-08-23 NOTE — ED Triage Notes (Signed)
Pt here for cough, congestion. sts that she has been taking delsym and other cold medications.

## 2016-08-31 ENCOUNTER — Encounter: Payer: Self-pay | Admitting: Internal Medicine

## 2016-08-31 ENCOUNTER — Ambulatory Visit (INDEPENDENT_AMBULATORY_CARE_PROVIDER_SITE_OTHER): Payer: 59 | Admitting: Internal Medicine

## 2016-08-31 VITALS — BP 122/84 | HR 83 | Temp 97.6°F | Resp 24 | Wt 194.0 lb

## 2016-08-31 DIAGNOSIS — J209 Acute bronchitis, unspecified: Secondary | ICD-10-CM | POA: Diagnosis not present

## 2016-08-31 MED ORDER — DOXYCYCLINE HYCLATE 100 MG PO TABS: 100.0000 mg | ORAL_TABLET | Freq: Two times a day (BID) | ORAL | 1 refills | Status: DC

## 2016-08-31 MED ORDER — HYDROCODONE-HOMATROPINE 5-1.5 MG/5ML PO SYRP: 5.0000 mL | ORAL_SOLUTION | Freq: Every evening | ORAL | 0 refills | Status: DC | PRN

## 2016-08-31 NOTE — Progress Notes (Signed)
Pre visit review using our clinic review tool, if applicable. No additional management support is needed unless otherwise documented below in the visit note. 

## 2016-08-31 NOTE — Progress Notes (Signed)
   Subjective:    Patient ID: Lindsey Wright, female    DOB: April 29, 1966, 50 y.o.   MRN: [id number]  HPI Here due to respiratory infection Has been sick for about a month--started with cold around Thanksgiving That seemed to go away-but never back to normal Then got severe cough---went to UC 8 days ago Got albuterol and prednisone there Inhaler not helping---?slight help with prednisone  Now is hoarse Still with bad cough--mostly dry Nasal drainage--clear No fever-- no chills or sweats Slight SOB--mostly with the cough No wheezing Cough bad at night-- better with propping  Tried delsym a while back--- no help  Current Outpatient Prescriptions on File Prior to Visit  Medication Sig Dispense Refill  . albuterol (PROVENTIL HFA;VENTOLIN HFA) 108 (90 Base) MCG/ACT inhaler Inhale 2 puffs into the lungs every 4 (four) hours as needed for wheezing or shortness of breath. 1 Inhaler 0  . losartan (COZAAR) 50 MG tablet TAKE 1 TABLET (50 MG TOTAL) BY MOUTH DAILY. 30 tablet 11  . potassium chloride SA (KLOR-CON M20) 20 MEQ tablet Take 2 tablets (40 mEq total) by mouth daily. 60 tablet 11  . triamterene-hydrochlorothiazide (MAXZIDE) 75-50 MG tablet TAKE 1/2 TABLETS BY MOUTH DAILY. 15 tablet 12   No current facility-administered medications on file prior to visit.     Allergies  Allergen Reactions  . Ace Inhibitors     Past Medical History:  Diagnosis Date  . Anemia    iron deficient anemia  . Hypertension   . Menorrhagia   . Overweight(278.02)     Past Surgical History:  Procedure Laterality Date  . CHOLECYSTECTOMY    . COSMETIC SURGERY    . TUBAL LIGATION      Family History  Problem Relation Age of Onset  . Hypertension Mother   . Diabetes Mother   . Stroke Mother   . Alcohol abuse Father   . Cancer Father     lung ca  . Hypertension Father   . Hypertension Sister     Social History   Social History  . Marital status: Married    Spouse name: N/A  .  Number of children: N/A  . Years of education: N/A   Occupational History  . Not on file.   Social History Main Topics  . Smoking status: Never Smoker  . Smokeless tobacco: Never Used  . Alcohol use No  . Drug use: No  . Sexual activity: Not on file   Other Topics Concern  . Not on file   Social History Narrative  . No narrative on file   Review of Systems No rash No vomiting or diarrhea Appetite is okay    Objective:   Physical Exam  Constitutional: She appears well-nourished. No distress.  HENT:  Mouth/Throat: Oropharynx is clear and moist. No oropharyngeal exudate.  No sinus tenderness TMs normal Moderate nasal congestion  Neck: Normal range of motion. No thyromegaly present.  Pulmonary/Chest: Effort normal and breath sounds normal. No respiratory distress. She has no wheezes. She has no rales.  Lymphadenopathy:    She has no cervical adenopathy.          Assessment & Plan:

## 2016-08-31 NOTE — Assessment & Plan Note (Signed)
Sounds like atypical infection Discussed extended time course Will try doxy Cough syrup for bedtime

## 2016-10-29 ENCOUNTER — Ambulatory Visit (INDEPENDENT_AMBULATORY_CARE_PROVIDER_SITE_OTHER): Payer: 59 | Admitting: Family Medicine

## 2016-10-29 ENCOUNTER — Encounter: Payer: Self-pay | Admitting: Family Medicine

## 2016-10-29 VITALS — BP 126/80 | HR 61 | Temp 98.7°F | Ht 67.0 in | Wt 192.8 lb

## 2016-10-29 DIAGNOSIS — I10 Essential (primary) hypertension: Secondary | ICD-10-CM

## 2016-10-29 DIAGNOSIS — R739 Hyperglycemia, unspecified: Secondary | ICD-10-CM | POA: Diagnosis not present

## 2016-10-29 DIAGNOSIS — E669 Obesity, unspecified: Secondary | ICD-10-CM | POA: Diagnosis not present

## 2016-10-29 LAB — COMPREHENSIVE METABOLIC PANEL
ALBUMIN: 4.3 g/dL (ref 3.5–5.2)
ALK PHOS: 80 U/L (ref 39–117)
ALT: 14 U/L (ref 0–35)
AST: 18 U/L (ref 0–37)
BUN: 12 mg/dL (ref 6–23)
CALCIUM: 9.5 mg/dL (ref 8.4–10.5)
CO2: 29 mEq/L (ref 19–32)
CREATININE: 0.87 mg/dL (ref 0.40–1.20)
Chloride: 103 mEq/L (ref 96–112)
GFR: 88.57 mL/min (ref >60.00)
Glucose, Bld: 115 mg/dL — ABNORMAL HIGH (ref 70–99)
Potassium: 3.8 mEq/L (ref 3.5–5.1)
SODIUM: 139 meq/L (ref 135–145)
TOTAL PROTEIN: 7.5 g/dL (ref 6.0–8.3)
Total Bilirubin: 0.5 mg/dL (ref 0.2–1.2)

## 2016-10-29 LAB — LIPID PANEL
CHOLESTEROL: 137 mg/dL (ref 0–200)
HDL: 40.3 mg/dL (ref >39.00)
LDL Cholesterol: 81 mg/dL (ref 0–99)
NonHDL: 96.51
TRIGLYCERIDES: 76 mg/dL (ref 0.0–149.0)
Total CHOL/HDL Ratio: 3
VLDL: 15.2 mg/dL (ref 0.0–40.0)

## 2016-10-29 LAB — CBC WITH DIFFERENTIAL/PLATELET
Basophils Absolute: 0.1 10*3/uL (ref 0.0–0.1)
Basophils Relative: 0.8 % (ref 0.0–3.0)
EOS PCT: 2.1 % (ref 0.0–5.0)
Eosinophils Absolute: 0.2 10*3/uL (ref 0.0–0.7)
HEMATOCRIT: 41.4 % (ref 36.0–46.0)
HEMOGLOBIN: 13.8 g/dL (ref 12.0–15.0)
LYMPHS PCT: 27.8 % (ref 12.0–46.0)
Lymphs Abs: 2.3 10*3/uL (ref 0.7–4.0)
MCHC: 33.4 g/dL (ref 30.0–36.0)
MCV: 92.9 fl (ref 78.0–100.0)
MONO ABS: 0.7 10*3/uL (ref 0.1–1.0)
MONOS PCT: 8.3 % (ref 3.0–12.0)
Neutro Abs: 5 10*3/uL (ref 1.4–7.7)
Neutrophils Relative %: 61 % (ref 43.0–77.0)
Platelets: 256 10*3/uL (ref 150.0–400.0)
RBC: 4.46 Mil/uL (ref 3.87–5.11)
RDW: 14.2 % (ref 11.5–15.5)
WBC: 8.2 10*3/uL (ref 4.0–10.5)

## 2016-10-29 LAB — TSH: TSH: 1.04 u[IU]/mL (ref 0.35–4.50)

## 2016-10-29 LAB — HEMOGLOBIN A1C: Hgb A1c MFr Bld: 6.8 % — ABNORMAL HIGH (ref 4.6–6.5)

## 2016-10-29 MED ORDER — LOSARTAN POTASSIUM 50 MG PO TABS: ORAL_TABLET | ORAL | 11 refills | Status: DC

## 2016-10-29 MED ORDER — POTASSIUM CHLORIDE CRYS ER 20 MEQ PO TBCR: 40.0000 meq | EXTENDED_RELEASE_TABLET | Freq: Every day | ORAL | 11 refills | Status: DC

## 2016-10-29 MED ORDER — TRIAMTERENE-HCTZ 75-50 MG PO TABS: ORAL_TABLET | ORAL | 11 refills | Status: DC

## 2016-10-29 NOTE — Progress Notes (Signed)
Pre visit review using our clinic review tool, if applicable. No additional management support is needed unless otherwise documented below in the visit note. 

## 2016-10-29 NOTE — Assessment & Plan Note (Signed)
Discussed how this problem influences overall health and the risks it imposes  Reviewed plan for weight loss with lower calorie diet (via better food choices and also portion control or program like weight watchers) and exercise building up to or more than 30 minutes 5 days per week including some aerobic activity    

## 2016-10-29 NOTE — Assessment & Plan Note (Signed)
bp in fair control at this time  BP Readings from Last 1 Encounters:  10/29/16 126/80   No changes needed Disc lifstyle change with low sodium diet and exercise  Labs today  Wt loss enc

## 2016-10-29 NOTE — Patient Instructions (Signed)
Take care of yourself  Eat a healthy diet - with less sweets/sugars  Also try to exercise 30 minutes per day  Labs today  No change in medicines Talk to your gyn about colon cancer screening

## 2016-10-29 NOTE — Assessment & Plan Note (Signed)
Lab Results  Component Value Date   HGBA1C 5.9 10/31/2015  re check this today  Disc low glycemic diet - she plans to gradually cut out sweets the best she can  Also discussed refined carbs and need for lean protein and exercise

## 2016-10-29 NOTE — Progress Notes (Signed)
Subjective:    Patient ID: Lindsey Wright, female    DOB: 25-Oct-1965, 51 y.o.   MRN: [id number]  HPI Here for f/u of chronic medical problems   Feeling ok overall  Had a bad bronchitis in December    Wt Readings from Last 3 Encounters:  10/29/16 192 lb 12 oz (87.4 kg)  08/31/16 194 lb (88 kg)  10/31/15 187 lb 12 oz (85.2 kg)  down 2 lbs  bmi is 30.1 Wants to cut out sweets /this is her weakness  She would like to loose some weight  Makes an effort to cut sugars/carbs when she can   Takes lorsartan and maxzide and K   bp is stable today  No cp or palpitations or headaches or edema  No side effects to medicines  BP Readings from Last 3 Encounters:  10/29/16 126/80  08/31/16 122/84  08/23/16 148/82      Hx of hyperglycemia Lab Results  Component Value Date   HGBA1C 5.9 10/31/2015  made an effort with diet  She walks for exercise- would like to do more /walks on her break at work       Chemistry      Component Value Date/Time   NA 139 10/31/2015 0911   K 3.7 10/31/2015 0911   CL 103 10/31/2015 0911   CO2 29 10/31/2015 0911   BUN 13 10/31/2015 0911   CREATININE 0.82 10/31/2015 0911      Component Value Date/Time   CALCIUM 9.4 10/31/2015 0911   ALKPHOS 91 10/31/2015 0911   AST 20 10/31/2015 0911   ALT 18 10/31/2015 0911   BILITOT 0.5 10/31/2015 0911      Due for labs   Patient Active Problem List   Diagnosis Date Noted  . Hyperglycemia 09/25/2013  . ANEMIA, IRON DEFICIENCY 09/27/2008  . Essential hypertension 08/15/2007   Past Medical History:  Diagnosis Date  . Anemia    iron deficient anemia  . Hypertension   . Menorrhagia   . Overweight(278.02)    Past Surgical History:  Procedure Laterality Date  . CHOLECYSTECTOMY    . COSMETIC SURGERY    . TUBAL LIGATION     Social History  Substance Use Topics  . Smoking status: Never Smoker  . Smokeless tobacco: Never Used  . Alcohol use No   Family History  Problem Relation Age of  Onset  . Hypertension Mother   . Diabetes Mother   . Stroke Mother   . Alcohol abuse Father   . Cancer Father     lung ca  . Hypertension Father   . Hypertension Sister    Allergies  Allergen Reactions  . Ace Inhibitors    Current Outpatient Prescriptions on File Prior to Visit  Medication Sig Dispense Refill  . losartan (COZAAR) 50 MG tablet TAKE 1 TABLET (50 MG TOTAL) BY MOUTH DAILY. 30 tablet 11  . potassium chloride SA (KLOR-CON M20) 20 MEQ tablet Take 2 tablets (40 mEq total) by mouth daily. 60 tablet 11  . triamterene-hydrochlorothiazide (MAXZIDE) 75-50 MG tablet TAKE 1/2 TABLETS BY MOUTH DAILY. 15 tablet 12   No current facility-administered medications on file prior to visit.     Review of Systems Review of Systems  Constitutional: Negative for fever, appetite change, fatigue and unexpected weight change.  Eyes: Negative for pain and visual disturbance.  Respiratory: Negative for cough and shortness of breath.   Cardiovascular: Negative for cp or palpitations    Gastrointestinal: Negative for nausea, diarrhea and  constipation.  Genitourinary: Negative for urgency and frequency. pos for abn endometrial layer- she is on progesterone for this from gyn Skin: Negative for pallor or rash   Neurological: Negative for weakness, light-headedness, numbness and headaches.  Hematological: Negative for adenopathy. Does not bruise/bleed easily.  Psychiatric/Behavioral: Negative for dysphoric mood. The patient is not nervous/anxious.         Objective:   Physical Exam  Constitutional: She appears well-developed and well-nourished. No distress.  Well appearing   HENT:  Head: Normocephalic and atraumatic.  Mouth/Throat: Oropharynx is clear and moist.  Eyes: Conjunctivae and EOM are normal. Pupils are equal, round, and reactive to light.  Neck: Normal range of motion. Neck supple. No JVD present. Carotid bruit is not present. No thyromegaly present.  Cardiovascular: Normal rate,  regular rhythm, normal heart sounds and intact distal pulses.  Exam reveals no gallop.   Pulmonary/Chest: Effort normal and breath sounds normal. No respiratory distress. She has no wheezes. She has no rales.  No crackles  Abdominal: Soft. Bowel sounds are normal. She exhibits no distension, no abdominal bruit and no mass. There is no tenderness.  Musculoskeletal: She exhibits no edema.  Lymphadenopathy:    She has no cervical adenopathy.  Neurological: She is alert. She has normal reflexes. No cranial nerve deficit. She exhibits normal muscle tone. Coordination normal.  Skin: Skin is warm and dry. No rash noted. No pallor.  Psychiatric: She has a normal mood and affect.          Assessment & Plan:   Problem List Items Addressed This Visit      Cardiovascular and Mediastinum   Essential hypertension - Primary    bp in fair control at this time  BP Readings from Last 1 Encounters:  10/29/16 126/80   No changes needed Disc lifstyle change with low sodium diet and exercise  Labs today  Wt loss enc       Relevant Medications   losartan (COZAAR) 50 MG tablet   triamterene-hydrochlorothiazide (MAXZIDE) 75-50 MG tablet   Other Relevant Orders   CBC with Differential/Platelet   Comprehensive metabolic panel   Lipid panel   TSH     Other   Hyperglycemia    Lab Results  Component Value Date   HGBA1C 5.9 10/31/2015  re check this today  Disc low glycemic diet - she plans to gradually cut out sweets the best she can  Also discussed refined carbs and need for lean protein and exercise       Relevant Orders   Hemoglobin A1c   Obesity (BMI 30-39.9)    Discussed how this problem influences overall health and the risks it imposes  Reviewed plan for weight loss with lower calorie diet (via better food choices and also portion control or program like weight watchers) and exercise building up to or more than 30 minutes 5 days per week including some aerobic activity

## 2016-11-03 ENCOUNTER — Other Ambulatory Visit: Payer: Self-pay | Admitting: Family Medicine

## 2016-12-24 ENCOUNTER — Other Ambulatory Visit: Payer: Self-pay | Admitting: Obstetrics & Gynecology

## 2016-12-24 DIAGNOSIS — Z1231 Encounter for screening mammogram for malignant neoplasm of breast: Secondary | ICD-10-CM

## 2017-01-15 ENCOUNTER — Telehealth: Payer: Self-pay | Admitting: Family Medicine

## 2017-01-15 DIAGNOSIS — R739 Hyperglycemia, unspecified: Secondary | ICD-10-CM

## 2017-01-15 DIAGNOSIS — Z01419 Encounter for gynecological examination (general) (routine) without abnormal findings: Secondary | ICD-10-CM | POA: Diagnosis not present

## 2017-01-15 DIAGNOSIS — I10 Essential (primary) hypertension: Secondary | ICD-10-CM

## 2017-01-15 DIAGNOSIS — N8500 Endometrial hyperplasia, unspecified: Secondary | ICD-10-CM | POA: Diagnosis not present

## 2017-01-15 NOTE — Telephone Encounter (Signed)
-----   Message from Ellamae Sia sent at 01/14/2017  4:05 PM EDT ----- Regarding: Lab orders for Friday, 5.18.18 Lab orders for a 3 month follow up appt.

## 2017-01-25 ENCOUNTER — Other Ambulatory Visit (INDEPENDENT_AMBULATORY_CARE_PROVIDER_SITE_OTHER): Payer: 59

## 2017-01-25 ENCOUNTER — Ambulatory Visit
Admission: RE | Admit: 2017-01-25 | Discharge: 2017-01-25 | Disposition: A | Discharge status: Home / Self Care | Payer: 59 | Source: Ambulatory Visit | Attending: Obstetrics & Gynecology | Admitting: Obstetrics & Gynecology

## 2017-01-25 DIAGNOSIS — R739 Hyperglycemia, unspecified: Secondary | ICD-10-CM

## 2017-01-25 DIAGNOSIS — Z1231 Encounter for screening mammogram for malignant neoplasm of breast: Secondary | ICD-10-CM

## 2017-01-25 DIAGNOSIS — I10 Essential (primary) hypertension: Secondary | ICD-10-CM | POA: Diagnosis not present

## 2017-01-25 LAB — COMPREHENSIVE METABOLIC PANEL
ALK PHOS: 89 U/L (ref 39–117)
ALT: 17 U/L (ref 0–35)
AST: 21 U/L (ref 0–37)
Albumin: 4.3 g/dL (ref 3.5–5.2)
BUN: 18 mg/dL (ref 6–23)
CHLORIDE: 103 meq/L (ref 96–112)
CO2: 31 meq/L (ref 19–32)
Calcium: 9.5 mg/dL (ref 8.4–10.5)
Creatinine, Ser: 0.89 mg/dL (ref 0.40–1.20)
GFR: 86.19 mL/min (ref >60.00)
GLUCOSE: 110 mg/dL — AB (ref 70–99)
POTASSIUM: 3.4 meq/L — AB (ref 3.5–5.1)
SODIUM: 139 meq/L (ref 135–145)
Total Bilirubin: 0.5 mg/dL (ref 0.2–1.2)
Total Protein: 7.6 g/dL (ref 6.0–8.3)

## 2017-01-25 LAB — HEMOGLOBIN A1C: Hgb A1c MFr Bld: 6.1 % (ref 4.6–6.5)

## 2017-01-29 ENCOUNTER — Encounter: Payer: Self-pay | Admitting: Family Medicine

## 2017-01-29 ENCOUNTER — Encounter: Payer: Self-pay | Admitting: Gastroenterology

## 2017-01-29 ENCOUNTER — Ambulatory Visit (INDEPENDENT_AMBULATORY_CARE_PROVIDER_SITE_OTHER): Payer: 59 | Admitting: Family Medicine

## 2017-01-29 VITALS — BP 136/80 | HR 67 | Temp 98.3°F | Ht 67.0 in | Wt 184.5 lb

## 2017-01-29 DIAGNOSIS — I10 Essential (primary) hypertension: Secondary | ICD-10-CM | POA: Diagnosis not present

## 2017-01-29 DIAGNOSIS — E663 Overweight: Secondary | ICD-10-CM | POA: Diagnosis not present

## 2017-01-29 DIAGNOSIS — Z1211 Encounter for screening for malignant neoplasm of colon: Secondary | ICD-10-CM | POA: Diagnosis not present

## 2017-01-29 DIAGNOSIS — R739 Hyperglycemia, unspecified: Secondary | ICD-10-CM

## 2017-01-29 NOTE — Progress Notes (Signed)
Subjective:    Patient ID: Lindsey Wright, female    DOB: 10/22/1965, 51 y.o.   MRN: [id number]  HPI Here for f/u of chronic health problems    Wt Readings from Last 3 Encounters:  01/29/17 184 lb 8 oz (83.7 kg)  10/29/16 192 lb 12 oz (87.4 kg)  08/31/16 194 lb (88 kg)  bmi is 28.9 She is walking regularly - 30 minutes per day  Working on her diet - staying away from sweets   (it is hard)   bp is stable today  No cp or palpitations or headaches or edema  No side effects to medicines  BP Readings from Last 3 Encounters:  01/29/17 136/80  10/29/16 126/80  08/31/16 122/84      Hx of hyperglycemia Lab Results  Component Value Date   HGBA1C 6.1 01/25/2017   This is down from 6.8!- doing much better with diet  Eating fruit instead of fruit  Mindful of bread and other simple carbs  Avoiding junk and french fries  Wants to loose more weight   She is ready to schedule her screening colonoscopy    Patient Active Problem List   Diagnosis Date Noted  . Colon cancer screening 01/29/2017  . Overweight (BMI 25.0-29.9) 10/29/2016  . Hyperglycemia 09/25/2013  . ANEMIA, IRON DEFICIENCY 09/27/2008  . Essential hypertension 08/15/2007   Past Medical History:  Diagnosis Date  . Anemia    iron deficient anemia  . Hypertension   . Menorrhagia   . Overweight(278.02)    Past Surgical History:  Procedure Laterality Date  . BREAST BIOPSY Right 02/01/2015   benign  . CHOLECYSTECTOMY    . COSMETIC SURGERY    . TUBAL LIGATION     Social History  Substance Use Topics  . Smoking status: Never Smoker  . Smokeless tobacco: Never Used  . Alcohol use No   Family History  Problem Relation Age of Onset  . Hypertension Mother   . Diabetes Mother   . Stroke Mother   . Alcohol abuse Father   . Cancer Father        lung ca  . Hypertension Father   . Hypertension Sister    Allergies  Allergen Reactions  . Ace Inhibitors    Current Outpatient Prescriptions on File  Prior to Visit  Medication Sig Dispense Refill  . losartan (COZAAR) 50 MG tablet TAKE 1 TABLET (50 MG TOTAL) BY MOUTH DAILY. 30 tablet 11  . potassium chloride SA (KLOR-CON M20) 20 MEQ tablet Take 2 tablets (40 mEq total) by mouth daily. 60 tablet 11  . triamterene-hydrochlorothiazide (MAXZIDE) 75-50 MG tablet TAKE 1/2 TABLETS BY MOUTH DAILY. 15 tablet 11   No current facility-administered medications on file prior to visit.     Review of Systems    Review of Systems  Constitutional: Negative for fever, appetite change, fatigue and unexpected weight change.  Eyes: Negative for pain and visual disturbance.  Respiratory: Negative for cough and shortness of breath.   Cardiovascular: Negative for cp or palpitations    Gastrointestinal: Negative for nausea, diarrhea and constipation.  Genitourinary: Negative for urgency and frequency.  Skin: Negative for pallor or rash   Neurological: Negative for weakness, light-headedness, numbness and headaches.  Hematological: Negative for adenopathy. Does not bruise/bleed easily.  Psychiatric/Behavioral: Negative for dysphoric mood. The patient is not nervous/anxious.      Objective:   Physical Exam  Constitutional: She appears well-developed and well-nourished. No distress.  overwt and well app  HENT:  Head: Normocephalic and atraumatic.  Mouth/Throat: Oropharynx is clear and moist.  Eyes: Conjunctivae and EOM are normal. Pupils are equal, round, and reactive to light.  Neck: Normal range of motion. Neck supple. No JVD present. Carotid bruit is not present. No thyromegaly present.  Cardiovascular: Normal rate, regular rhythm, normal heart sounds and intact distal pulses.  Exam reveals no gallop.   Pulmonary/Chest: Effort normal and breath sounds normal. No respiratory distress. She has no wheezes. She has no rales.  No crackles  Abdominal: Soft. Bowel sounds are normal. She exhibits no distension, no abdominal bruit and no mass. There is no  tenderness.  Musculoskeletal: She exhibits no edema.  Lymphadenopathy:    She has no cervical adenopathy.  Neurological: She is alert. She has normal reflexes.  Skin: Skin is warm and dry. No rash noted.  Psychiatric: She has a normal mood and affect.          Assessment & Plan:   Problem List Items Addressed This Visit      Cardiovascular and Mediastinum   Essential hypertension - Primary    bp in fair control at this time  BP Readings from Last 1 Encounters:  01/29/17 136/80   No changes needed Disc lifstyle change with low sodium diet and exercise  Labs reviewed         Other   Colon cancer screening    Refer for first screening colonoscopy -age 78      Relevant Orders   Ambulatory referral to Gastroenterology   Hyperglycemia    Lab Results  Component Value Date   HGBA1C 6.1 01/25/2017  this is down from 6.8 with weight loss and improved diet an exercise  Commended disc imp of low glycemic diet and wt loss to prevent DM2        Overweight (BMI 25.0-29.9)    Discussed how this problem influences overall health and the risks it imposes  Reviewed plan for weight loss with lower calorie diet (via better food choices and also portion control or program like weight watchers) and exercise building up to or more than 30 minutes 5 days per week including some aerobic activity   Commended on wt loss so far and urged to keep it up

## 2017-01-29 NOTE — Patient Instructions (Addendum)
Keep up the great job with diet and exercise and weight loss  We will refer you for a screening colonoscopy   Take care of yourself   Follow up in 6 months with labs prior

## 2017-01-30 NOTE — Assessment & Plan Note (Signed)
Refer for first screening colonoscopy -age 51

## 2017-01-30 NOTE — Assessment & Plan Note (Signed)
Lab Results  Component Value Date   HGBA1C 6.1 01/25/2017  this is down from 6.8 with weight loss and improved diet an exercise  Commended disc imp of low glycemic diet and wt loss to prevent DM2

## 2017-01-30 NOTE — Assessment & Plan Note (Signed)
Discussed how this problem influences overall health and the risks it imposes  Reviewed plan for weight loss with lower calorie diet (via better food choices and also portion control or program like weight watchers) and exercise building up to or more than 30 minutes 5 days per week including some aerobic activity   Commended on wt loss so far and urged to keep it up

## 2017-01-30 NOTE — Assessment & Plan Note (Signed)
bp in fair control at this time  BP Readings from Last 1 Encounters:  01/29/17 136/80   No changes needed Disc lifstyle change with low sodium diet and exercise  Labs reviewed

## 2017-03-15 ENCOUNTER — Ambulatory Visit (AMBULATORY_SURGERY_CENTER): Payer: Self-pay | Admitting: *Deleted

## 2017-03-15 VITALS — Ht 66.5 in | Wt 185.6 lb

## 2017-03-15 DIAGNOSIS — Z1211 Encounter for screening for malignant neoplasm of colon: Secondary | ICD-10-CM

## 2017-03-15 MED ORDER — NA SULFATE-K SULFATE-MG SULF 17.5-3.13-1.6 GM/177ML PO SOLN: 1.0000 | Freq: Once | ORAL | 0 refills | Status: AC

## 2017-03-15 NOTE — Progress Notes (Signed)
No egg or soy allergy known to patient  No issues with past sedation with any surgeries  or procedures, no intubation problems  No diet pills per patient No home 02 use per patient  No blood thinners per patient  Pt denies issues with constipation  No A fib or A flutter  EMMI video sent to pt's e mail  

## 2017-03-18 ENCOUNTER — Encounter: Payer: Self-pay | Admitting: Gastroenterology

## 2017-03-27 ENCOUNTER — Ambulatory Visit (AMBULATORY_SURGERY_CENTER): Payer: 59 | Admitting: Gastroenterology

## 2017-03-27 ENCOUNTER — Encounter: Payer: Self-pay | Admitting: Gastroenterology

## 2017-03-27 VITALS — BP 119/75 | HR 73 | Temp 96.6°F | Resp 19 | Ht 66.5 in | Wt 185.0 lb

## 2017-03-27 DIAGNOSIS — K621 Rectal polyp: Secondary | ICD-10-CM

## 2017-03-27 DIAGNOSIS — Z1211 Encounter for screening for malignant neoplasm of colon: Secondary | ICD-10-CM

## 2017-03-27 DIAGNOSIS — D120 Benign neoplasm of cecum: Secondary | ICD-10-CM

## 2017-03-27 DIAGNOSIS — K635 Polyp of colon: Secondary | ICD-10-CM

## 2017-03-27 DIAGNOSIS — Z1212 Encounter for screening for malignant neoplasm of rectum: Secondary | ICD-10-CM

## 2017-03-27 DIAGNOSIS — D125 Benign neoplasm of sigmoid colon: Secondary | ICD-10-CM | POA: Diagnosis not present

## 2017-03-27 DIAGNOSIS — D128 Benign neoplasm of rectum: Secondary | ICD-10-CM

## 2017-03-27 MED ORDER — SODIUM CHLORIDE 0.9 % IV SOLN: 500.0000 mL | INTRAVENOUS | Status: DC

## 2017-03-27 NOTE — Patient Instructions (Signed)
Handout given on polyps  YOU HAD AN ENDOSCOPIC PROCEDURE TODAY: Refer to the procedure report and other information in the discharge instructions given to you for any specific questions about what was found during the examination. If this information does not answer your questions, please call [place] office at [phone] to clarify.   YOU SHOULD EXPECT: Some feelings of bloating in the abdomen. Passage of more gas than usual. Walking can help get rid of the air that was put into your GI tract during the procedure and reduce the bloating. If you had a lower endoscopy (such as a colonoscopy or flexible sigmoidoscopy) you may notice spotting of blood in your stool or on the toilet paper. Some abdominal soreness may be present for a day or two, also.  DIET: Your first meal following the procedure should be a light meal and then it is ok to progress to your normal diet. A half-sandwich or bowl of soup is an example of a good first meal. Heavy or fried foods are harder to digest and may make you feel nauseous or bloated. Drink plenty of fluids but you should avoid alcoholic beverages for 24 hours. If you had a esophageal dilation, please see attached instructions for diet.    ACTIVITY: Your care partner should take you home directly after the procedure. You should plan to take it easy, moving slowly for the rest of the day. You can resume normal activity the day after the procedure however YOU SHOULD NOT DRIVE, use power tools, machinery or perform tasks that involve climbing or major physical exertion for 24 hours (because of the sedation medicines used during the test).   SYMPTOMS TO REPORT IMMEDIATELY: A gastroenterologist can be reached at any hour. Please call [phone]  for any of the following symptoms:  Following lower endoscopy (colonoscopy, flexible sigmoidoscopy) Excessive amounts of blood in the stool  Significant tenderness, worsening of abdominal pains  Swelling of the abdomen that is  new, acute  Fever of 100 or higher    FOLLOW UP:  If any biopsies were taken you will be contacted by phone or by letter within the next 1-3 weeks. Call [phone]  if you have not heard about the biopsies in 3 weeks.  Please also call with any specific questions about appointments or follow up tests.  

## 2017-03-27 NOTE — Op Note (Signed)
[facility] Patient Name: Lindsey Wright Procedure Date: 03/27/2017 11:29 AM MRN: [id number] Endoscopist: Mauri Pole , MD Age: 51 Referring MD:  Date of Birth: [birth year] Gender: Female Account #: [id number] Procedure:                Colonoscopy Indications:              Screening for colorectal malignant neoplasm Medicines:                Monitored Anesthesia Care Procedure:                Pre-Anesthesia Assessment:                           - Prior to the procedure, a History and Physical                            was performed, and patient medications and                            allergies were reviewed. The patient's tolerance of                            previous anesthesia was also reviewed. The risks                            and benefits of the procedure and the sedation                            options and risks were discussed with the patient.                            All questions were answered, and informed consent                            was obtained. Prior Anticoagulants: The patient has                            taken no previous anticoagulant or antiplatelet                            agents. ASA Grade Assessment: II - A patient with                            mild systemic disease. After reviewing the risks                            and benefits, the patient was deemed in                            satisfactory condition to undergo the procedure.                           After obtaining informed consent, the colonoscope  was passed under direct vision. Throughout the                            procedure, the patient's blood pressure, pulse, and                            oxygen saturations were monitored continuously. The                            Model CF-HQ190L [id number]) scope was introduced                            through the anus and advanced to the the cecum,                            identified  by appendiceal orifice and ileocecal                            valve. The colonoscopy was performed without                            difficulty. The patient tolerated the procedure                            well. The quality of the bowel preparation was                            good. The ileocecal valve, appendiceal orifice, and                            rectum were photographed. Scope In: 11:34:03 AM Scope Out: 11:49:56 AM Scope Withdrawal Time: 0 hours 11 minutes 16 seconds  Total Procedure Duration: 0 hours 15 minutes 53 seconds  Findings:                 The perianal and digital rectal examinations were                            normal.                           A 9 mm polyp was found in the cecum. The polyp was                            sessile. The polyp was removed with a cold snare.                            Resection and retrieval were complete.                           Three sessile polyps were found in the rectum and                            sigmoid colon. The polyps were 1 to 3 mm in  size.                            These polyps were removed with a cold biopsy                            forceps. Resection and retrieval were complete.                           Non-bleeding internal hemorrhoids were found during                            retroflexion. The hemorrhoids were small.                           The exam was otherwise without abnormality. Complications:            No immediate complications. Estimated Blood Loss:     Estimated blood loss was minimal. Impression:               - One 9 mm polyp in the cecum, removed with a cold                            snare. Resected and retrieved.                           - Three 1 to 3 mm polyps in the rectum and in the                            sigmoid colon, removed with a cold biopsy forceps.                            Resected and retrieved.                           - Non-bleeding internal hemorrhoids.                            - The examination was otherwise normal. Recommendation:           - Patient has a contact number available for                            emergencies. The signs and symptoms of potential                            delayed complications were discussed with the                            patient. Return to normal activities tomorrow.                            Written discharge instructions were provided to the                            patient.                           -  Resume previous diet.                           - Continue present medications.                           - Await pathology results.                           - Repeat colonoscopy in 3 - 5 years for                            surveillance based on pathology results. Mauri Pole, MD 03/27/2017 11:54:02 AM This report has been signed electronically.

## 2017-03-27 NOTE — Progress Notes (Signed)
Called to room to assist during endoscopic procedure.  Patient ID and intended procedure confirmed with present staff. Received instructions for my participation in the procedure from the performing physician.  

## 2017-03-27 NOTE — Progress Notes (Signed)
To PACU, vss patent aw report to rn 

## 2017-03-28 ENCOUNTER — Telehealth: Payer: Self-pay | Admitting: *Deleted

## 2017-03-28 NOTE — Telephone Encounter (Signed)
  Follow up Call-  Call back number 03/27/2017  Post procedure Call Back phone  # [phone]  Permission to leave phone message No  Some recent data might be hidden     Patient questions:  Do you have a fever, pain , or abdominal swelling? No. Pain Score  0 *  Have you tolerated food without any problems? Yes.    Have you been able to return to your normal activities? Yes.    Do you have any questions about your discharge instructions: Diet   No. Medications  No. Follow up visit  No.  Do you have questions or concerns about your Care? No  Actions: * If pain score is 4 or above: No action needed, pain <4.

## 2017-04-07 ENCOUNTER — Encounter: Payer: Self-pay | Admitting: Gastroenterology

## 2017-09-10 HISTORY — PX: BREAST LUMPECTOMY: SHX2

## 2017-11-09 ENCOUNTER — Other Ambulatory Visit: Payer: Self-pay | Admitting: Family Medicine

## 2017-11-11 ENCOUNTER — Ambulatory Visit: Payer: 59 | Admitting: Family Medicine

## 2017-11-11 ENCOUNTER — Encounter: Payer: Self-pay | Admitting: Family Medicine

## 2017-11-11 VITALS — BP 128/78 | HR 77 | Temp 97.5°F | Ht 67.0 in | Wt 189.0 lb

## 2017-11-11 DIAGNOSIS — Z1211 Encounter for screening for malignant neoplasm of colon: Secondary | ICD-10-CM | POA: Diagnosis not present

## 2017-11-11 DIAGNOSIS — I10 Essential (primary) hypertension: Secondary | ICD-10-CM

## 2017-11-11 DIAGNOSIS — R739 Hyperglycemia, unspecified: Secondary | ICD-10-CM | POA: Diagnosis not present

## 2017-11-11 DIAGNOSIS — E663 Overweight: Secondary | ICD-10-CM | POA: Diagnosis not present

## 2017-11-11 LAB — COMPREHENSIVE METABOLIC PANEL
ALT: 18 U/L (ref 0–35)
AST: 20 U/L (ref 0–37)
Albumin: 3.9 g/dL (ref 3.5–5.2)
Alkaline Phosphatase: 86 U/L (ref 39–117)
BUN: 14 mg/dL (ref 6–23)
CHLORIDE: 101 meq/L (ref 96–112)
CO2: 31 mEq/L (ref 19–32)
CREATININE: 0.91 mg/dL (ref 0.40–1.20)
Calcium: 9.6 mg/dL (ref 8.4–10.5)
GFR: 83.74 mL/min (ref >60.00)
GLUCOSE: 98 mg/dL (ref 70–99)
POTASSIUM: 3.5 meq/L (ref 3.5–5.1)
Sodium: 139 mEq/L (ref 135–145)
TOTAL PROTEIN: 7.7 g/dL (ref 6.0–8.3)
Total Bilirubin: 0.5 mg/dL (ref 0.2–1.2)

## 2017-11-11 LAB — CBC WITH DIFFERENTIAL/PLATELET
Basophils Absolute: 0.1 10*3/uL (ref 0.0–0.1)
Basophils Relative: 0.8 % (ref 0.0–3.0)
EOS PCT: 2.5 % (ref 0.0–5.0)
Eosinophils Absolute: 0.2 10*3/uL (ref 0.0–0.7)
HCT: 41.1 % (ref 36.0–46.0)
Hemoglobin: 14 g/dL (ref 12.0–15.0)
LYMPHS ABS: 2.3 10*3/uL (ref 0.7–4.0)
Lymphocytes Relative: 32.6 % (ref 12.0–46.0)
MCHC: 34.1 g/dL (ref 30.0–36.0)
MCV: 92.3 fl (ref 78.0–100.0)
MONO ABS: 0.5 10*3/uL (ref 0.1–1.0)
Monocytes Relative: 7.3 % (ref 3.0–12.0)
NEUTROS PCT: 56.8 % (ref 43.0–77.0)
Neutro Abs: 4.1 10*3/uL (ref 1.4–7.7)
Platelets: 230 10*3/uL (ref 150.0–400.0)
RBC: 4.45 Mil/uL (ref 3.87–5.11)
RDW: 14.2 % (ref 11.5–15.5)
WBC: 7.1 10*3/uL (ref 4.0–10.5)

## 2017-11-11 LAB — LIPID PANEL
Cholesterol: 121 mg/dL (ref 0–200)
HDL: 44.2 mg/dL (ref >39.00)
LDL CALC: 60 mg/dL (ref 0–99)
NONHDL: 76.4
Total CHOL/HDL Ratio: 3
Triglycerides: 82 mg/dL (ref 0.0–149.0)
VLDL: 16.4 mg/dL (ref 0.0–40.0)

## 2017-11-11 LAB — HEMOGLOBIN A1C: HEMOGLOBIN A1C: 6 % (ref 4.6–6.5)

## 2017-11-11 LAB — TSH: TSH: 1.11 u[IU]/mL (ref 0.35–4.50)

## 2017-11-11 MED ORDER — POTASSIUM CHLORIDE CRYS ER 20 MEQ PO TBCR: 40.0000 meq | EXTENDED_RELEASE_TABLET | Freq: Every day | ORAL | 11 refills | Status: DC

## 2017-11-11 MED ORDER — LOSARTAN POTASSIUM 50 MG PO TABS: ORAL_TABLET | ORAL | 11 refills | Status: DC

## 2017-11-11 MED ORDER — TRIAMTERENE-HCTZ 75-50 MG PO TABS: ORAL_TABLET | ORAL | 11 refills | Status: DC

## 2017-11-11 NOTE — Assessment & Plan Note (Signed)
Had colonoscopy 7/18 with polyps

## 2017-11-11 NOTE — Progress Notes (Signed)
Subjective:    Patient ID: Lindsey Wright, female    DOB: 10/20/1965, 52 y.o.   MRN: [id number]  HPI Here for f/u of chronic medical problems   Very busy Tries to take care of herself   FIL passed away and husband's aunts also died   Had a colonoscopy in 2023/04/14 - had polyps   Wt Readings from Last 3 Encounters:  11/11/17 189 lb (85.7 kg)  03/27/17 185 lb (83.9 kg)  03/15/17 185 lb 9.6 oz (84.2 kg)  wt is up  She is watching sugar intake  Walks for exercise 5 days per week at work (2 fifteen minute walks)  29.60 kg/m    bp is stable today  No cp or palpitations or headaches or edema  No side effects to medicines  Takes losartan and maxzide  BP Readings from Last 3 Encounters:  11/11/17 128/78  03/27/17 119/75  01/29/17 136/80     Hyperglycemia Lab Results  Component Value Date   HGBA1C 6.1 01/25/2017   Trying to eat less sugar Eating granola bars  (watches brands grams)  / greek yogurt      Lab Results  Component Value Date   CREATININE 0.89 01/25/2017   BUN 18 01/25/2017   NA 139 01/25/2017   K 3.4 (L) 01/25/2017   CL 103 01/25/2017   CO2 31 01/25/2017  takes 20 meq K daily   Lab Results  Component Value Date   ALT 17 01/25/2017   AST 21 01/25/2017   ALKPHOS 89 01/25/2017   BILITOT 0.5 01/25/2017    Lab Results  Component Value Date   CHOL 137 10/29/2016   HDL 40.30 10/29/2016   LDLCALC 81 10/29/2016   TRIG 76.0 10/29/2016   CHOLHDL 3 10/29/2016   Lab Results  Component Value Date   WBC 8.2 10/29/2016   HGB 13.8 10/29/2016   HCT 41.4 10/29/2016   MCV 92.9 10/29/2016   PLT 256.0 10/29/2016    Patient Active Problem List   Diagnosis Date Noted  . Colon cancer screening 01/29/2017  . Overweight (BMI 25.0-29.9) 10/29/2016  . Hyperglycemia 09/25/2013  . Essential hypertension 08/15/2007   Past Medical History:  Diagnosis Date  . Anemia    iron deficient anemia  . Hypertension   . Menorrhagia   . Overweight(278.02)     Past Surgical History:  Procedure Laterality Date  . BREAST BIOPSY Right 02/01/2015   benign  . CHOLECYSTECTOMY    . COSMETIC SURGERY    . TUBAL LIGATION     Social History   Tobacco Use  . Smoking status: Never Smoker  . Smokeless tobacco: Never Used  Substance Use Topics  . Alcohol use: No    Alcohol/week: 0.0 oz  . Drug use: No   Family History  Problem Relation Age of Onset  . Hypertension Mother   . Diabetes Mother   . Stroke Mother   . Alcohol abuse Father   . Cancer Father        lung ca  . Hypertension Father   . Hypertension Sister   . Colon cancer Neg Hx   . Colon polyps Neg Hx   . Rectal cancer Neg Hx   . Stomach cancer Neg Hx    Allergies  Allergen Reactions  . Ace Inhibitors Cough   No current outpatient medications on file prior to visit.   No current facility-administered medications on file prior to visit.     Review of Systems  Constitutional: Positive for  fatigue. Negative for activity change, appetite change, fever and unexpected weight change.  HENT: Negative for congestion, ear pain, rhinorrhea, sinus pressure and sore throat.   Eyes: Negative for pain, redness and visual disturbance.  Respiratory: Negative for cough, shortness of breath and wheezing.   Cardiovascular: Negative for chest pain and palpitations.  Gastrointestinal: Negative for abdominal pain, blood in stool, constipation and diarrhea.  Endocrine: Negative for polydipsia and polyuria.  Genitourinary: Negative for dysuria, frequency and urgency.  Musculoskeletal: Negative for arthralgias, back pain and myalgias.  Skin: Negative for pallor and rash.  Allergic/Immunologic: Negative for environmental allergies.  Neurological: Negative for dizziness, syncope and headaches.  Hematological: Negative for adenopathy. Does not bruise/bleed easily.  Psychiatric/Behavioral: Negative for decreased concentration and dysphoric mood. The patient is not nervous/anxious.        Stressors         Objective:   Physical Exam  Constitutional: She appears well-developed and well-nourished. No distress.  obese and well appearing   HENT:  Head: Normocephalic and atraumatic.  Mouth/Throat: Oropharynx is clear and moist.  Eyes: Conjunctivae and EOM are normal. Pupils are equal, round, and reactive to light.  Neck: Normal range of motion. Neck supple. No JVD present. Carotid bruit is not present. No thyromegaly present.  Cardiovascular: Normal rate, regular rhythm, normal heart sounds and intact distal pulses. Exam reveals no gallop.  Pulmonary/Chest: Effort normal and breath sounds normal. No respiratory distress. She has no wheezes. She has no rales.  No crackles  Abdominal: Soft. Bowel sounds are normal. She exhibits no distension, no abdominal bruit and no mass. There is no tenderness.  Musculoskeletal: She exhibits no edema.  Lymphadenopathy:    She has no cervical adenopathy.  Neurological: She is alert. She has normal reflexes. No cranial nerve deficit. She exhibits normal muscle tone. Coordination normal.  Skin: Skin is warm and dry. No rash noted.  Psychiatric: She has a normal mood and affect.  Pleasant           Assessment & Plan:   Problem List Items Addressed This Visit      Cardiovascular and Mediastinum   Essential hypertension - Primary    bp in fair control at this time  BP Readings from Last 1 Encounters:  11/11/17 128/78   No changes needed Disc lifstyle change with low sodium diet and exercise  Well controlled with losartan and maxzide  Enc exercise and wt loss  Lab today      Relevant Medications   losartan (COZAAR) 50 MG tablet   triamterene-hydrochlorothiazide (MAXZIDE) 75-50 MG tablet     Other   Colon cancer screening    Had colonoscopy 7/18 with polyps       Hyperglycemia    A1C today  Disc imp of low carb / (produce for carb) diet with exercise and wt loss  disc imp of low glycemic diet and wt loss to prevent DM2        Overweight (BMI 25.0-29.9)    Discussed how this problem influences overall health and the risks it imposes  Reviewed plan for weight loss with lower calorie diet (via better food choices and also portion control or program like weight watchers) and exercise building up to or more than 30 minutes 5 days per week including some aerobic activity

## 2017-11-11 NOTE — Patient Instructions (Addendum)
To prevent diabetes  Try to get most of your carbohydrates from produce (with the exception of white potatoes)  Eat less bread/pasta/rice/snack foods/cereals/sweets and other items from the middle of the grocery store (processed carbs)    Keep exercising !  Labs today

## 2017-11-11 NOTE — Assessment & Plan Note (Signed)
bp in fair control at this time  BP Readings from Last 1 Encounters:  11/11/17 128/78   No changes needed Disc lifstyle change with low sodium diet and exercise  Well controlled with losartan and maxzide  Enc exercise and wt loss  Lab today

## 2017-11-11 NOTE — Assessment & Plan Note (Signed)
A1C today  Disc imp of low carb / (produce for carb) diet with exercise and wt loss  disc imp of low glycemic diet and wt loss to prevent DM2

## 2017-11-11 NOTE — Assessment & Plan Note (Signed)
Discussed how this problem influences overall health and the risks it imposes  Reviewed plan for weight loss with lower calorie diet (via better food choices and also portion control or program like weight watchers) and exercise building up to or more than 30 minutes 5 days per week including some aerobic activity    

## 2017-12-09 HISTORY — PX: BREAST BIOPSY: SHX20

## 2017-12-17 ENCOUNTER — Other Ambulatory Visit: Payer: Self-pay

## 2017-12-17 DIAGNOSIS — Z1231 Encounter for screening mammogram for malignant neoplasm of breast: Secondary | ICD-10-CM

## 2017-12-18 ENCOUNTER — Other Ambulatory Visit: Payer: Self-pay | Admitting: Obstetrics & Gynecology

## 2017-12-18 DIAGNOSIS — N6320 Unspecified lump in the left breast, unspecified quadrant: Secondary | ICD-10-CM

## 2017-12-20 ENCOUNTER — Other Ambulatory Visit: Payer: Self-pay | Admitting: Obstetrics & Gynecology

## 2017-12-20 ENCOUNTER — Ambulatory Visit
Admission: RE | Admit: 2017-12-20 | Discharge: 2017-12-20 | Disposition: A | Discharge status: Home / Self Care | Payer: 59 | Source: Ambulatory Visit | Attending: Obstetrics & Gynecology | Admitting: Obstetrics & Gynecology

## 2017-12-20 DIAGNOSIS — N6320 Unspecified lump in the left breast, unspecified quadrant: Secondary | ICD-10-CM | POA: Diagnosis not present

## 2017-12-20 DIAGNOSIS — R928 Other abnormal and inconclusive findings on diagnostic imaging of breast: Secondary | ICD-10-CM | POA: Diagnosis not present

## 2017-12-23 ENCOUNTER — Other Ambulatory Visit: Payer: Self-pay | Admitting: Obstetrics & Gynecology

## 2017-12-23 ENCOUNTER — Ambulatory Visit
Admission: RE | Admit: 2017-12-23 | Discharge: 2017-12-23 | Disposition: A | Discharge status: Home / Self Care | Payer: 59 | Source: Ambulatory Visit | Attending: Obstetrics & Gynecology | Admitting: Obstetrics & Gynecology

## 2017-12-23 DIAGNOSIS — N644 Mastodynia: Secondary | ICD-10-CM

## 2017-12-23 DIAGNOSIS — N6320 Unspecified lump in the left breast, unspecified quadrant: Secondary | ICD-10-CM

## 2017-12-23 DIAGNOSIS — R928 Other abnormal and inconclusive findings on diagnostic imaging of breast: Secondary | ICD-10-CM | POA: Diagnosis not present

## 2017-12-23 DIAGNOSIS — N6342 Unspecified lump in left breast, subareolar: Secondary | ICD-10-CM | POA: Diagnosis not present

## 2017-12-23 DIAGNOSIS — C50512 Malignant neoplasm of lower-outer quadrant of left female breast: Secondary | ICD-10-CM | POA: Diagnosis not present

## 2017-12-23 DIAGNOSIS — N6323 Unspecified lump in the left breast, lower outer quadrant: Secondary | ICD-10-CM | POA: Diagnosis not present

## 2017-12-25 ENCOUNTER — Telehealth: Payer: Self-pay | Admitting: Oncology

## 2017-12-25 NOTE — Telephone Encounter (Signed)
Spoke to patient to confirm morning [facility] appointment for 4/24, packet will be mailed to patient

## 2017-12-26 ENCOUNTER — Encounter: Payer: Self-pay | Admitting: *Deleted

## 2017-12-26 DIAGNOSIS — C50512 Malignant neoplasm of lower-outer quadrant of left female breast: Secondary | ICD-10-CM

## 2017-12-26 DIAGNOSIS — Z170 Estrogen receptor positive status [ER+]: Principal | ICD-10-CM

## 2017-12-30 ENCOUNTER — Other Ambulatory Visit: Payer: Self-pay | Admitting: *Deleted

## 2017-12-30 DIAGNOSIS — Z170 Estrogen receptor positive status [ER+]: Principal | ICD-10-CM

## 2017-12-30 DIAGNOSIS — C50512 Malignant neoplasm of lower-outer quadrant of left female breast: Secondary | ICD-10-CM

## 2017-12-30 NOTE — Progress Notes (Signed)
[place]  [phone] [phone]     ID: Lindsey Wright DOB: 1965-12-22  MR#: [id number]  VFI#:[id number]  Patient Care Team: [name], MD as PCP - General Roxie Gueye, [name], MD as Consulting Physician (Oncology) Vania Rea, MD as Consulting Physician (Obstetrics and Gynecology) [name], MD as Consulting Physician (General Surgery) Kyung Rudd, MD as Consulting Physician (Radiation Oncology) OTHER MD:   CHIEF COMPLAINT: HER-2 positive breast cancer  CURRENT TREATMENT: Neoadjuvant chemoimmunotherapy   HISTORY OF CURRENT ILLNESS: "Lindsey Wright" palpated a mass in the left breast about 2 weeks ago. She followed up with her gynecologist, Dr. Jean Rosenthal who referred her to the [facility] for mammography. She underwent unilateral left diagnostic mammography with tomography and left breast ultrasonography at [facility] on 12/20/2017 showing: breast density category B. Suspicious newly palpable left breast mass at the 5 o'clock lower outer position with internal blow flow measuring 2.3 x 1.8 x 2.5 cm. There are either 2 adjacent masses or a single complicated mass at the 4:16 lower outer position, 4 cm from the nipple measuring 1.8 x 0.5 by 0.9 cm which may represent fibrocystic changes versus a solid mass. No other suspicious findings. Ultrasound showed normal axillary nodes.  Accordingly on 12/23/2017 she proceeded to biopsy of the left breast areas in question. The pathology from this procedure showed ([id number]): At both the 5 o'clock spanning 1.2 cm and 3:30 position spanning 0.7 cm, invasive ductal carcinoma, grade III. Prognostic indicators significant for: estrogen receptor, 10% positive with weak staining intensity and progesterone receptor, 0% negative. Proliferation marker Ki67 at 90%. HER2 amplified with ratios ER2/CEP17 signals 2.33 and average HER2 copies per cell 4.65  The patient's subsequent history is as detailed  below.  INTERVAL HISTORY: "Lindsey Wright" was evaluated in the multidisciplinary breast cancer clinic on 01/01/2018 accompanied by her husband, Lindsey Wright, and her friend, Lindsey Wright. Her case was also presented at the multidisciplinary breast cancer conference on the same day. At that time a preliminary plan was proposed:   REVIEW OF SYSTEMS: There were no specific symptoms leading to the original mammogram, which was routinely scheduled. Today, she feels discomfort in her chest due to some stress. The patient denies unusual headaches, visual changes, nausea, vomiting, stiff neck, dizziness, or gait imbalance. There has been no cough, phlegm production, or pleurisy, no chest pain or pressure, and no change in bowel or bladder habits. The patient denies fever, rash, bleeding, unexplained fatigue or unexplained weight loss. A detailed review of systems was otherwise entirely negative.   PAST MEDICAL HISTORY: Past Medical History:  Diagnosis Date  . Anemia    iron deficient anemia  . Hypertension   . Menorrhagia   . Overweight(278.02)     PAST SURGICAL HISTORY: Past Surgical History:  Procedure Laterality Date  . BREAST BIOPSY Right 02/01/2015   benign  . CHOLECYSTECTOMY    . COSMETIC SURGERY    . TUBAL LIGATION      FAMILY HISTORY Family History  Problem Relation Age of Onset  . Hypertension Mother   . Diabetes Mother   . Stroke Mother   . Alcohol abuse Father   . Cancer Father        lung ca  . Hypertension Father   . Hypertension Sister   . Colon cancer Neg Hx   . Colon polyps Neg Hx   . Rectal cancer Neg Hx   . Stomach cancer Neg Hx    The patien's father was diagnosed with lung  cancer at age 65 and died the same year. The patient's mother died at age 16 due to several strokes. The patient had 1 brother who died due to strokes and possibly a MI. The patient has 1 sister. She denies a history of breast or ovarian cancer in the family.    GYNECOLOGIC HISTORY:  No LMP recorded.  (Menstrual status: Perimenopausal). Menarche: 52 years old Age at first live birth: 52 years old The patient is GXP2. The patient is not having periods, with her LMP being in 2010. She used oral contraceptive for about 10 years with no complications. She never used HRT.    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 Her husband, Lindsey Wright, works part time in Therapist, art.  The patient's daughter Lindsey Wright age 14, is a Ship broker and starting a job. The patient's daughter Lindsey Wright age 22, is a Ship broker.  Both of the patient's daughters live with her.     ADVANCED DIRECTIVES:    HEALTH MAINTENANCE: Social History   Tobacco Use  . Smoking status: Never Smoker  . Smokeless tobacco: Never Used  Substance Use Topics  . Alcohol use: No    Alcohol/week: 0.0 oz  . Drug use: No     Colonoscopy: 03/27/2017 polyp removal/ Dr. Deeann Saint  PAP: May 2017 normal  Bone density:   Allergies  Allergen Reactions  . Ace Inhibitors Cough    Current Outpatient Medications  Medication Sig Dispense Refill  . losartan (COZAAR) 50 MG tablet TAKE 1 TABLET (50 MG TOTAL) BY MOUTH DAILY. 30 tablet 11  . potassium chloride SA (KLOR-CON M20) 20 MEQ tablet Take 2 tablets (40 mEq total) by mouth daily. 60 tablet 11  . triamterene-hydrochlorothiazide (MAXZIDE) 75-50 MG tablet TAKE 1/2 TABLETS BY MOUTH DAILY. 15 tablet 11   No current facility-administered medications for this visit.     OBJECTIVE: Middle-aged African-American woman who appears well  Vitals:   01/01/18 0851  BP: (!) 158/86  Pulse: 74  Resp: 17  Temp: 98.4 F (36.9 C)  SpO2: 100%     Body mass index is 29.68 kg/m.   Wt Readings from Last 3 Encounters:  01/01/18 189 lb 8 oz (86 kg)  11/11/17 189 lb (85.7 kg)  03/27/17 185 lb (83.9 kg)      ECOG FS:0 - Asymptomatic  Ocular: Sclerae unicteric, pupils round and equal Ear-nose-throat: Oropharynx clear and moist Lymphatic: No cervical or  supraclavicular adenopathy Lungs no rales or rhonchi Heart regular rate and rhythm Abd soft, nontender, positive bowel sounds MSK no focal spinal tenderness, no joint edema Neuro: non-focal, well-oriented, appropriate affect Breasts: The right breast is unremarkable.  The left breast is status post recent biopsy.  I find it difficult to palpate a well-defined mass with the associated ecchymosis.  Both axillae are benign   LAB RESULTS:  CMP     Component Value Date/Time   NA 140 01/01/2018 0831   K 3.5 01/01/2018 0831   CL 103 01/01/2018 0831   CO2 26 01/01/2018 0831   GLUCOSE 173 (H) 01/01/2018 0831   BUN 13 01/01/2018 0831   CREATININE 0.95 01/01/2018 0831   CALCIUM 9.5 01/01/2018 0831   PROT 7.7 01/01/2018 0831   ALBUMIN 3.9 01/01/2018 0831   AST 18 01/01/2018 0831   ALT 17 01/01/2018 0831   ALKPHOS 97 01/01/2018 0831   BILITOT 0.3 01/01/2018 0831   GFRNONAA >60 01/01/2018 0831   GFRAA >60 01/01/2018 0831    No results found  for: TOTALPROTELP, ALBUMINELP, A1GS, A2GS, BETS, BETA2SER, GAMS, MSPIKE, SPEI  No results found for: KPAFRELGTCHN, LAMBDASER, [facility]  Lab Results  Component Value Date   WBC 6.5 01/01/2018   NEUTROABS 4.3 01/01/2018   HGB 13.9 01/01/2018   HCT 41.4 01/01/2018   MCV 92.2 01/01/2018   PLT 210 01/01/2018    _0 @  No results found for: LABCA2  No components found for: [id number]  No results for input(s): INR in the last 168 hours.  No results found for: LABCA2  No results found for: TJQ300  No results found for: PQZ300  No results found for: TMA263  No results found for: CA2729  No components found for: HGQUANT  No results found for: CEA1 / No results found for: CEA1   No results found for: AFPTUMOR  No results found for: CHROMOGRNA  No results found for: PSA1  Appointment on 01/01/2018  Component Date Value Ref Range Status  . WBC Count 01/01/2018 6.5  3.9 - 10.3 K/uL Final  . RBC 01/01/2018 4.49  3.70 -  5.45 MIL/uL Final  . Hemoglobin 01/01/2018 13.9  11.6 - 15.9 g/dL Final  . HCT 01/01/2018 41.4  34.8 - 46.6 % Final  . MCV 01/01/2018 92.2  79.5 - 101.0 fL Final  . MCH 01/01/2018 30.9  25.1 - 34.0 pg Final  . MCHC 01/01/2018 33.5  31.5 - 36.0 g/dL Final  . RDW 01/01/2018 14.0  11.2 - 14.5 % Final  . Platelet Count 01/01/2018 210  145 - 400 K/uL Final  . Neutrophils Relative % 01/01/2018 66  % Final  . Neutro Abs 01/01/2018 4.3  1.5 - 6.5 K/uL Final  . Lymphocytes Relative 01/01/2018 25  % Final  . Lymphs Abs 01/01/2018 1.6  0.9 - 3.3 K/uL Final  . Monocytes Relative 01/01/2018 6  % Final  . Monocytes Absolute 01/01/2018 0.4  0.1 - 0.9 K/uL Final  . Eosinophils Relative 01/01/2018 2  % Final  . Eosinophils Absolute 01/01/2018 0.1  0.0 - 0.5 K/uL Final  . Basophils Relative 01/01/2018 1  % Final  . Basophils Absolute 01/01/2018 0.0  0.0 - 0.1 K/uL Final   Performed at [facility], [place] [address] [place], [place] [address]  . Sodium 01/01/2018 140  136 - 145 mmol/L Final  . Potassium 01/01/2018 3.5  3.5 - 5.1 mmol/L Final  . Chloride 01/01/2018 103  98 - 109 mmol/L Final  . CO2 01/01/2018 26  22 - 29 mmol/L Final  . Glucose, Bld 01/01/2018 173* 70 - 140 mg/dL Final  . BUN 01/01/2018 13  7 - 26 mg/dL Final  . Creatinine 01/01/2018 0.95  0.60 - 1.10 mg/dL Final  . Calcium 01/01/2018 9.5  8.4 - 10.4 mg/dL Final  . Total Protein 01/01/2018 7.7  6.4 - 8.3 g/dL Final  . Albumin 01/01/2018 3.9  3.5 - 5.0 g/dL Final  . AST 01/01/2018 18  5 - 34 U/L Final  . ALT 01/01/2018 17  0 - 55 U/L Final  . Alkaline Phosphatase 01/01/2018 97  40 - 150 U/L Final  . Total Bilirubin 01/01/2018 0.3  0.2 - 1.2 mg/dL Final  . GFR, Est Non Af Am 01/01/2018 >60  >60 mL/min Final  . GFR, Est AFR Am 01/01/2018 >60  >60 mL/min Final   Comment: (NOTE) The eGFR has been calculated using the CKD EPI equation. This calculation has not been validated in all clinical situations. eGFR's  persistently <60 mL/min signify possible Chronic Kidney Disease.   Marland Kitchen  Anion gap 01/01/2018 11  3 - 11 Final   Performed at [facility], [place] [address], [place], [place] [address]    (this displays the last labs from the last 3 days)  No results found for: TOTALPROTELP, ALBUMINELP, A1GS, A2GS, BETS, BETA2SER, GAMS, MSPIKE, SPEI (this displays SPEP labs)  No results found for: KPAFRELGTCHN, LAMBDASER, KAPLAMBRATIO (kappa/lambda light chains)  No results found for: HGBA, HGBA2QUANT, HGBFQUANT, HGBSQUAN (Hemoglobinopathy evaluation)   No results found for: LDH  Lab Results  Component Value Date   IRON 104 09/27/2008   IRONPCTSAT 25.1 09/27/2008   (Iron and TIBC)  No results found for: FERRITIN  Urinalysis No results found for: COLORURINE, APPEARANCEUR, LABSPEC, PHURINE, GLUCOSEU, HGBUR, BILIRUBINUR, KETONESUR, PROTEINUR, UROBILINOGEN, NITRITE, LEUKOCYTESUR   STUDIES: US Breast Ltd Uni Left Inc Axilla  Result Date: 12/20/2017 : Please see the diagnostic mammogram report from the same day. Electronically Signed   By: Dorise Bullion III M.D   On: 12/20/2017 09:24   Mm Diag Breast Tomo Uni Left  Result Date: 12/20/2017 CLINICAL DATA:  New palpable lump in the left breast. EXAM: DIGITAL DIAGNOSTIC LEFT MAMMOGRAM WITH CAD AND TOMO ULTRASOUND LEFT BREAST COMPARISON:  Previous exam(s). ACR Breast Density Category b: There are scattered areas of fibroglandular density. FINDINGS: There is a new dense subareolar mass being felt by the patient measuring 3 cm. There is another mass or cluster of masses in the lateral inferior left breast at a posterior depth which is new. There is a mass in the medial left breast as well. No other suspicious findings. Mammographic images were processed with CAD. On physical exam, there is a firm subareolar lump. Targeted ultrasound is performed, showing a solid mass in the subareolar region of the left breast at 5 o'clock with internal  blood flow measuring 2.3 x 1.8 x 2.5 cm. There are either 2 adjacent masses or a single complicated mass at 8:84, 4 cm from the nipple measuring 1.8 x 0.5 by 0.9 cm. While I suspect fibrocystic changes in this location, the findings are not certain based on ultrasound. There is a cyst at 11 o'clock in the left breast correlating with the third mammographically identified mass. The axillary nodes are normal. IMPRESSION: Suspicious newly palpable left breast mass. Indeterminate mass at 3:30 in the left breast which may represent fibrocystic changes versus a solid mass. No other suspicious findings. RECOMMENDATION: Recommend ultrasound-guided biopsy of the newly palpable left breast mass. Recommend ultrasound-guided biopsy of the 3:30 mass. If malignancy is found with biopsy, the patient will need a right mammogram. The right breast has not been imaged since May of 2018. I have discussed the findings and recommendations with the patient. Results were also provided in writing at the conclusion of the visit. If applicable, a reminder letter will be sent to the patient regarding the next appointment. BI-RADS CATEGORY  4: Suspicious. Electronically Signed   By: Dorise Bullion III M.D   On: 12/20/2017 09:18   Mm Diag Breast Tomo Uni Right  Result Date: 12/23/2017 CLINICAL DATA:  RIGHT breast pain.  Suspicious LEFT breast masses. EXAM: DIGITAL DIAGNOSTIC UNILATERAL RIGHT MAMMOGRAM WITH CAD AND TOMO COMPARISON:  12/20/2017 and earlier ACR Breast Density Category b: There are scattered areas of fibroglandular density. FINDINGS: No suspicious mass, distortion, or microcalcifications are identified to suggest presence of malignancy. Mammographic images were processed with CAD. IMPRESSION: No mammographic evidence for malignancy. RECOMMENDATION: Routine RIGHT mammogram is recommended in 1 year. I have discussed the findings  and recommendations with the patient. Results were also provided in writing at the conclusion of the  visit. If applicable, a reminder letter will be sent to the patient regarding the next appointment. BI-RADS CATEGORY  4: Suspicious. Electronically Signed   By: Nolon Nations M.D.   On: 12/23/2017 09:19   Mm Clip Placement Left  Result Date: 12/23/2017 CLINICAL DATA:  Status post ultrasound-guided core biopsy of masses in the 3:30 o'clock 11 o'clock locations of the LEFT breast. EXAM: DIAGNOSTIC LEFT MAMMOGRAM POST ULTRASOUND BIOPSY x2 COMPARISON:  Previous exam(s). FINDINGS: Mammographic images were obtained following ultrasound guided biopsy of mass in the 3:30 o'clock location of the LEFT breast and placement of a ribbon shaped clip. Patient also had biopsy of retroareolar mass in the 5 o'clock location of the LEFT breast marked with a coil shaped clip. The clips are in expected locations and 6 centimeters apart. IMPRESSION: Tissue marker clips are in the expected locations after biopsy. Final Assessment: Post Procedure Mammograms for Marker Placement Electronically Signed   By: Nolon Nations M.D.   On: 12/23/2017 09:18   [place] Lt Breast Bx W Loc Dev 1st Lesion Img Bx Spec US Guide  Addendum Date: 12/26/2017   ADDENDUM REPORT: 12/26/2017 07:32 ADDENDUM: Pathology revealed GRADE III INVASIVE DUCTAL CARCINOMA of the Left breast, both locations, 3:30 o'clock 4cm (ribbon clip) and 5 o'clock/retroareolar (coil clip). This was found to be concordant by Dr. Nolon Nations. Pathology results were discussed with the patient by telephone. The patient reported doing well after the biopsies with tenderness at the sites. Post biopsy instructions and care were reviewed and questions were answered. The patient was encouraged to call [facility] for any additional concerns. The patient was referred to [facility] at [facility] on Aril 24, 2019. Pathology results reported by Terie Purser, RN on 12/26/2017. Electronically Signed    By: Nolon Nations M.D.   On: 12/26/2017 07:32   Result Date: 12/26/2017 CLINICAL DATA:  Patient presents for ultrasound-guided core biopsy of mass in the LEFT breast. EXAM: ULTRASOUND GUIDED LEFT BREAST CORE NEEDLE BIOPSY x2 COMPARISON:  Previous exam(s). FINDINGS: I met with the patient and we discussed the procedure of ultrasound-guided biopsy, including benefits and alternatives. We discussed the high likelihood of a successful procedure. We discussed the risks of the procedure, including infection, bleeding, tissue injury, clip migration, and inadequate sampling. Informed written consent was given. The usual time-out protocol was performed immediately prior to the procedure. Site 1: Lesion quadrant: LOWER OUTER LEFT breast. Using sterile technique and 1% Lidocaine as local anesthetic, under direct ultrasound visualization, a 12 gauge spring-loaded device was used to perform biopsy of mass in the 3:30 o'clock location of the LEFT breast using a LATERAL approach. At the conclusion of the procedure a ribbon shaped tissue marker clip was deployed into the biopsy cavity. Site 2: Lesion QUADRANT: LOWER OUTER LEFT breast. Using sterile technique and 1% lidocaine as local anesthetic, under direct ultrasound visualization a 14 gauge spring-loaded device was used to perform biopsy of mass in the 5 o'clock retroareolar region of the LEFT breast using a LATERAL approach. At the conclusion of procedure a coil shaped tissue marker clip was deployed into the biopsy cavity. Follow up 2 view mammogram was performed and dictated separately. IMPRESSION: Ultrasound guided biopsy of masses in the 3:30 o'clock and 5 o'clock locations of the LEFT breast. No apparent complications. Electronically Signed: By: Everardo Beals.D.  On: 12/23/2017 09:17   [place] Lt Breast Bx W Loc Dev Ea Add Lesion Img Bx Spec US Guide  Addendum Date: 12/26/2017   ADDENDUM REPORT: 12/26/2017 07:32 ADDENDUM: Pathology revealed GRADE III INVASIVE  DUCTAL CARCINOMA of the Left breast, both locations, 3:30 o'clock 4cm (ribbon clip) and 5 o'clock/retroareolar (coil clip). This was found to be concordant by Dr. Nolon Nations. Pathology results were discussed with the patient by telephone. The patient reported doing well after the biopsies with tenderness at the sites. Post biopsy instructions and care were reviewed and questions were answered. The patient was encouraged to call [facility] for any additional concerns. The patient was referred to [facility] at [facility] on Aril 24, 2019. Pathology results reported by Terie Purser, RN on 12/26/2017. Electronically Signed   By: Nolon Nations M.D.   On: 12/26/2017 07:32   Result Date: 12/26/2017 CLINICAL DATA:  Patient presents for ultrasound-guided core biopsy of mass in the LEFT breast. EXAM: ULTRASOUND GUIDED LEFT BREAST CORE NEEDLE BIOPSY x2 COMPARISON:  Previous exam(s). FINDINGS: I met with the patient and we discussed the procedure of ultrasound-guided biopsy, including benefits and alternatives. We discussed the high likelihood of a successful procedure. We discussed the risks of the procedure, including infection, bleeding, tissue injury, clip migration, and inadequate sampling. Informed written consent was given. The usual time-out protocol was performed immediately prior to the procedure. Site 1: Lesion quadrant: LOWER OUTER LEFT breast. Using sterile technique and 1% Lidocaine as local anesthetic, under direct ultrasound visualization, a 12 gauge spring-loaded device was used to perform biopsy of mass in the 3:30 o'clock location of the LEFT breast using a LATERAL approach. At the conclusion of the procedure a ribbon shaped tissue marker clip was deployed into the biopsy cavity. Site 2: Lesion QUADRANT: LOWER OUTER LEFT breast. Using sterile technique and 1% lidocaine as local anesthetic, under direct  ultrasound visualization a 14 gauge spring-loaded device was used to perform biopsy of mass in the 5 o'clock retroareolar region of the LEFT breast using a LATERAL approach. At the conclusion of procedure a coil shaped tissue marker clip was deployed into the biopsy cavity. Follow up 2 view mammogram was performed and dictated separately. IMPRESSION: Ultrasound guided biopsy of masses in the 3:30 o'clock and 5 o'clock locations of the LEFT breast. No apparent complications. Electronically Signed: By: Nolon Nations M.D. On: 12/23/2017 09:17    ELIGIBLE FOR AVAILABLE RESEARCH PROTOCOL: Exact signs of study  ASSESSMENT: 52 y.o.  [place], [place] woman status post left breast lower outer quadrant biopsy 12/23/2017 for a clinically multifocal T2 N0, stage II invasive ductal carcinoma, grade 3, essentially estrogen receptor and progesterone receptor negative, but HER-2 amplified, with an MIB-1 of 90%.  (1) neoadjuvant chemotherapy will consist of carboplatin and docetaxel every 21 days for 6 cycles  (2) anti-HER-2 immunotherapy will consist of trastuzumab and Pertuzumab, to be continued for 6 months, starting concurrently with chemotherapy  (3) definitive surgery pending  (4) adjuvant radiation as appropriate  (5) consider antiestrogens given the weak receptor positivity  PLAN: We spent the better part of today's hour-long appointment discussing the biology of her diagnosis and the specifics of her situation. We first reviewed the fact that cancer is not one disease but more than 100 different diseases and that it is important to keep them separate-- otherwise when friends and relatives discuss their own cancer experiences with Lindsey Wright confusion can result. Similarly we  explained that if breast cancer spreads to the bone or liver, the patient would not have bone cancer or liver cancer, but breast cancer in the bone and breast cancer in the liver: one cancer in three places-- not 3 different cancers  which otherwise would have to be treated in 3 different ways.  We discussed the difference between local and systemic therapy. In terms of loco-regional treatment, lumpectomy plus radiation is equivalent to mastectomy as far as survival is concerned. For this reason, and because the cosmetic results are generally superior, we recommend breast conserving surgery if possible.  To make that more likely we recommend neoadjuvant chemotherapy.  We emphasized the fact that in terms of sequencing of treatments, whether systemic therapy or surgery is done first does not affect the ultimate outcome.  We then discussed the rationale for systemic therapy. There is some risk that this cancer may have already spread to other parts of her body. Patients frequently ask at this point about bone scans, CAT scans and PET scans to find out if they have occult breast cancer somewhere else. The problem is that in early stage disease we are much more likely to find false positives then true cancers and this would expose the patient to unnecessary procedures as well as unnecessary radiation. Scans cannot answer the question the patient really would like to know, which is whether she has microscopic disease elsewhere in her body. For those reasons we do not recommend them.  Of course we would proceed to aggressive evaluation of any symptoms that might suggest metastatic disease, but that is not the case here.  Next we went over the options for systemic therapy which are anti-estrogens, anti-HER-2 immunotherapy, and chemotherapy. Lindsey Wright is a good candidate for anti-HER-2 immunotherapy.  This implies chemotherapy.  She is unlikely to obtain any significant risk reduction from anti-estrogens.  We then discussed standard of care in her situation which consist of carboplatin, trastuzumab, Pertuzumab, and docetaxel.  We reviewed the possible toxicity side effects and complications of these agents.  We also reviewed the possible  benefits which include a high risk of cure.  The patient will need a port and echocardiogram and a discussion with our chemotherapy teaching nurse.  She will return to see me a few days before the start of treatment to discuss how to take supportive meds.  The target start date is 01/20/2018.  Once she completes chemotherapy she will proceed to definitive surgery, either mastectomy or double lumpectomy, then radiation after which she will start antiestrogens.  Lindsey Wright has a good understanding of the overall plan. She agrees with it. She knows the goal of treatment in her case is cure. She will call with any problems that may develop before her next visit here.   Lindsey Wright, [name], MD  01/01/18 6:30 PM Medical Oncology and Hematology [facility] [address] [place], [place] [address] Tel. [phone]    Fax. [phone]  This document serves as a record of services personally performed by [place] Del, MD. It was created on his behalf by Sheron Nightingale, a trained medical scribe. The creation of this record is based on the scribe's personal observations and the provider's statements to them.   I have reviewed the above documentation for accuracy and completeness, and I agree with the above.

## 2018-01-01 ENCOUNTER — Ambulatory Visit: Payer: Self-pay | Admitting: General Surgery

## 2018-01-01 ENCOUNTER — Inpatient Hospital Stay: Payer: 59 | Attending: Oncology | Admitting: Oncology

## 2018-01-01 ENCOUNTER — Other Ambulatory Visit: Payer: Self-pay

## 2018-01-01 ENCOUNTER — Encounter: Payer: Self-pay | Admitting: Oncology

## 2018-01-01 ENCOUNTER — Ambulatory Visit
Admission: RE | Admit: 2018-01-01 | Discharge: 2018-01-01 | Disposition: A | Discharge status: Home / Self Care | Payer: 59 | Source: Ambulatory Visit | Attending: Radiation Oncology | Admitting: Radiation Oncology

## 2018-01-01 ENCOUNTER — Encounter: Payer: Self-pay | Admitting: *Deleted

## 2018-01-01 ENCOUNTER — Inpatient Hospital Stay: Payer: 59

## 2018-01-01 ENCOUNTER — Encounter: Payer: Self-pay | Admitting: General Practice

## 2018-01-01 ENCOUNTER — Ambulatory Visit: Payer: 59

## 2018-01-01 VITALS — BP 158/86 | HR 74 | Temp 98.4°F | Resp 17 | Wt 189.5 lb

## 2018-01-01 DIAGNOSIS — C50512 Malignant neoplasm of lower-outer quadrant of left female breast: Secondary | ICD-10-CM | POA: Diagnosis not present

## 2018-01-01 DIAGNOSIS — I10 Essential (primary) hypertension: Secondary | ICD-10-CM | POA: Diagnosis not present

## 2018-01-01 DIAGNOSIS — Z9049 Acquired absence of other specified parts of digestive tract: Secondary | ICD-10-CM | POA: Diagnosis not present

## 2018-01-01 DIAGNOSIS — Z170 Estrogen receptor positive status [ER+]: Secondary | ICD-10-CM | POA: Diagnosis not present

## 2018-01-01 DIAGNOSIS — D509 Iron deficiency anemia, unspecified: Secondary | ICD-10-CM | POA: Diagnosis not present

## 2018-01-01 DIAGNOSIS — N920 Excessive and frequent menstruation with regular cycle: Secondary | ICD-10-CM | POA: Insufficient documentation

## 2018-01-01 DIAGNOSIS — Z87891 Personal history of nicotine dependence: Secondary | ICD-10-CM | POA: Insufficient documentation

## 2018-01-01 DIAGNOSIS — Z801 Family history of malignant neoplasm of trachea, bronchus and lung: Secondary | ICD-10-CM

## 2018-01-01 LAB — CMP (CANCER CENTER ONLY)
ALT: 17 U/L (ref 0–55)
ANION GAP: 11 (ref 3–11)
AST: 18 U/L (ref 5–34)
Albumin: 3.9 g/dL (ref 3.5–5.0)
Alkaline Phosphatase: 97 U/L (ref 40–150)
BUN: 13 mg/dL (ref 7–26)
CALCIUM: 9.5 mg/dL (ref 8.4–10.4)
CHLORIDE: 103 mmol/L (ref 98–109)
CO2: 26 mmol/L (ref 22–29)
CREATININE: 0.95 mg/dL (ref 0.60–1.10)
GFR, Estimated: >60 mL/min (ref >60)
Glucose, Bld: 173 mg/dL — ABNORMAL HIGH (ref 70–140)
Potassium: 3.5 mmol/L (ref 3.5–5.1)
SODIUM: 140 mmol/L (ref 136–145)
Total Bilirubin: 0.3 mg/dL (ref 0.2–1.2)
Total Protein: 7.7 g/dL (ref 6.4–8.3)

## 2018-01-01 LAB — CBC WITH DIFFERENTIAL (CANCER CENTER ONLY)
Basophils Absolute: 0 10*3/uL (ref 0.0–0.1)
Basophils Relative: 1 %
EOS ABS: 0.1 10*3/uL (ref 0.0–0.5)
Eosinophils Relative: 2 %
HCT: 41.4 % (ref 34.8–46.6)
Hemoglobin: 13.9 g/dL (ref 11.6–15.9)
LYMPHS ABS: 1.6 10*3/uL (ref 0.9–3.3)
LYMPHS PCT: 25 %
MCH: 30.9 pg (ref 25.1–34.0)
MCHC: 33.5 g/dL (ref 31.5–36.0)
MCV: 92.2 fL (ref 79.5–101.0)
MONO ABS: 0.4 10*3/uL (ref 0.1–0.9)
MONOS PCT: 6 %
Neutro Abs: 4.3 10*3/uL (ref 1.5–6.5)
Neutrophils Relative %: 66 %
PLATELETS: 210 10*3/uL (ref 145–400)
RBC: 4.49 MIL/uL (ref 3.70–5.45)
RDW: 14 % (ref 11.2–14.5)
WBC Count: 6.5 10*3/uL (ref 3.9–10.3)

## 2018-01-01 NOTE — Progress Notes (Signed)
[place] Psychosocial Distress Screening Clinical Social Work  Clinical Social Work was referred by distress screening protocol.  The patient scored a 8 on the Psychosocial Distress Thermometer which indicates severe distress. Clinical Social Worker Edwyna Shell to assess for distress and other psychosocial needs. Met w patient, husband and patients friend (who is a cancer survivor and strong support) in exam room.  CSW and patient discussed common feeling and emotions when being diagnosed with cancer, and the importance of support during treatment. CSW informed patient of the support team and support services at [facility]. CSW provided contact information and encouraged patient to call with any questions or concerns.  Patient processing diagnosis and treatment plan, encouraged to reach out for support as needed.  Has strong support from family and friends, does not anticipate needs for help w transportation or in home support.  Encouraged to reach out to [facility] as needed.       ONCBCN DISTRESS SCREENING 01/01/2018  Screening Type Initial Screening  Distress experienced in past week (1-10) 8  Referral to clinical psychology No  Referral to clinical social work Yes    Clinical Social Worker follow up needed: No.  If yes, follow up plan:  Edwyna Shell, LCSW Clinical Social Worker Phone:  [phone]

## 2018-01-01 NOTE — Progress Notes (Signed)
START ON PATHWAY REGIMEN - Breast     A cycle is every 21 days:     Pertuzumab      Pertuzumab      Trastuzumab      Trastuzumab      Carboplatin      Docetaxel   **Always confirm dose/schedule in your pharmacy ordering system**    Patient Characteristics: Preoperative or Nonsurgical Candidate (Clinical Staging), Neoadjuvant Therapy followed by Surgery, Invasive Disease, Chemotherapy, HER2 Positive, ER Negative/Unknown Therapeutic Status: Preoperative or Nonsurgical Candidate (Clinical Staging) AJCC M Category: cM0 AJCC Grade: G3 Breast Surgical Plan: Neoadjuvant Therapy followed by Surgery ER Status: Negative (-) AJCC 8 Stage Grouping: IIA HER2 Status: Positive (+) AJCC T Category: cT2 AJCC N Category: cN0 PR Status: Negative (-) Intent of Therapy: Curative Intent, Discussed with Patient 

## 2018-01-01 NOTE — Progress Notes (Signed)
Radiation Oncology         [phone] ________________________________  Name: Lindsey Wright        MRN: [id number]  Date of Service: 01/01/2018 DOB: 1965-10-06  CC:Tower, Wynelle Fanny, MD  [name], MD     REFERRING PHYSICIAN: [name], MD   DIAGNOSIS: The encounter diagnosis was Malignant neoplasm of lower-outer quadrant of 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, estrogen receptor positive ([place]).   HISTORY OF PRESENT ILLNESS: Lindsey Wright is a 52 y.o. female seen in the multidisciplinary breast clinic for a new diagnosis of left breast cancer. The patient was noted to have a palpable mass on examination and on diagnostic imaging was found to have 3 areas on her mammogram in the left breast. By ultrasound at 5:00 there was a 2.5 cm mass consistent with her palpable finding, and at 3:30 she had a 1.8 cm mass. There was no ultrasound correlate given her mammogram finding. The axilla was negative for adenopathy. She underwent a biopsy of the 5:00 and 3:30 sites and both were consistent with grade 3, invasive ductal carcinoma, ER weakly positive, PR negative, HER2 ampllified with a Ki 67 of 90%. She comes today to discuss options of treatment of her cancer.     PREVIOUS RADIATION THERAPY: No   PAST MEDICAL HISTORY:  Past Medical History:  Diagnosis Date  . Anemia    iron deficient anemia  . Hypertension   . Menorrhagia   . Overweight(278.02)        PAST SURGICAL HISTORY: Past Surgical History:  Procedure Laterality Date  . BREAST BIOPSY Right 02/01/2015   benign  . CHOLECYSTECTOMY    . COSMETIC SURGERY    . TUBAL LIGATION       FAMILY HISTORY:  Family History  Problem Relation Age of Onset  . Hypertension Mother   . Diabetes Mother   . Stroke Mother   . Alcohol abuse Father   . Cancer Father        lung ca  . Hypertension Father   . Hypertension Sister   . Colon cancer Neg Hx   . Colon polyps Neg Hx   . Rectal cancer Neg Hx   . Stomach cancer Neg Hx       SOCIAL HISTORY:  reports that she has never smoked. She has never used smokeless tobacco. She reports that she does not drink alcohol or use drugs. The patient is married and lives in [place]. She works for [employer]   ALLERGIES: Ace inhibitors   MEDICATIONS:  Current Outpatient Medications  Medication Sig Dispense Refill  . losartan (COZAAR) 50 MG tablet TAKE 1 TABLET (50 MG TOTAL) BY MOUTH DAILY. 30 tablet 11  . potassium chloride SA (KLOR-CON M20) 20 MEQ tablet Take 2 tablets (40 mEq total) by mouth daily. 60 tablet 11  . triamterene-hydrochlorothiazide (MAXZIDE) 75-50 MG tablet TAKE 1/2 TABLETS BY MOUTH DAILY. 15 tablet 11   No current facility-administered medications for this encounter.      REVIEW OF SYSTEMS: On review of systems, the patient reports that she is doing well overall. She denies any chest pain, shortness of breath, cough, fevers, chills, night sweats, unintended weight changes. She denies any bowel or bladder disturbances, and denies abdominal pain, nausea or vomiting. She denies any new musculoskeletal or joint aches or pains. A complete review of systems is obtained and is otherwise negative.     PHYSICAL EXAM:  Wt Readings from Last 3 Encounters:  11/11/17 189 lb (  85.7 kg)  03/27/17 185 lb (83.9 kg)  03/15/17 185 lb 9.6 oz (84.2 kg)   Temp Readings from Last 3 Encounters:  11/11/17 (!) 97.5 F (36.4 C) (Oral)  03/27/17 (!) 96.6 F (35.9 C) (Temporal)  01/29/17 98.3 F (36.8 C) (Oral)   BP Readings from Last 3 Encounters:  11/11/17 128/78  03/27/17 119/75  01/29/17 136/80   Pulse Readings from Last 3 Encounters:  11/11/17 77  03/27/17 73  01/29/17 67     In general this is a well appearing African American  female in no acute distress. She is alert and oriented x4 and appropriate throughout the examination. HEENT reveals that the patient is normocephalic, atraumatic. EOMs are intact.   ECOG = 0  0 - Asymptomatic (Fully  active, able to carry on all predisease activities without restriction)  1 - Symptomatic but completely ambulatory (Restricted in physically strenuous activity but ambulatory and able to carry out work of a light or sedentary nature. For example, light housework, office work)  2 - Symptomatic, <50% in bed during the day (Ambulatory and capable of all self care but unable to carry out any work activities. Up and about more than 50% of waking hours)  3 - Symptomatic, >50% in bed, but not bedbound (Capable of only limited self-care, confined to bed or chair 50% or more of waking hours)  4 - Bedbound (Completely disabled. Cannot carry on any self-care. Totally confined to bed or chair)  5 - Death   Eustace Pen MM, Creech RH, Tormey DC, et al. [phone]). "Toxicity and response criteria of the [facility]". [place] Oncol. 5 (6): 649-55    LABORATORY DATA:  Lab Results  Component Value Date   WBC 7.1 11/11/2017   HGB 14.0 11/11/2017   HCT 41.1 11/11/2017   MCV 92.3 11/11/2017   PLT 230.0 11/11/2017   Lab Results  Component Value Date   NA 139 11/11/2017   K 3.5 11/11/2017   CL 101 11/11/2017   CO2 31 11/11/2017   Lab Results  Component Value Date   ALT 18 11/11/2017   AST 20 11/11/2017   ALKPHOS 86 11/11/2017   BILITOT 0.5 11/11/2017      RADIOGRAPHY: US Breast Ltd Uni Left Inc Axilla  Result Date: 12/20/2017 : Please see the diagnostic mammogram report from the same day. Electronically Signed   By: Dorise Bullion III M.D   On: 12/20/2017 09:24   Mm Diag Breast Tomo Uni Left  Result Date: 12/20/2017 CLINICAL DATA:  New palpable lump in the left breast. EXAM: DIGITAL DIAGNOSTIC LEFT MAMMOGRAM WITH CAD AND TOMO ULTRASOUND LEFT BREAST COMPARISON:  Previous exam(s). ACR Breast Density Category b: There are scattered areas of fibroglandular density. FINDINGS: There is a new dense subareolar mass being felt by the patient measuring 3 cm. There is another mass or  cluster of masses in the lateral inferior left breast at a posterior depth which is new. There is a mass in the medial left breast as well. No other suspicious findings. Mammographic images were processed with CAD. On physical exam, there is a firm subareolar lump. Targeted ultrasound is performed, showing a solid mass in the subareolar region of the left breast at 5 o'clock with internal blood flow measuring 2.3 x 1.8 x 2.5 cm. There are either 2 adjacent masses or a single complicated mass at 8:75, 4 cm from the nipple measuring 1.8 x 0.5 by 0.9 cm. While I suspect fibrocystic changes in this  location, the findings are not certain based on ultrasound. There is a cyst at 11 o'clock in the left breast correlating with the third mammographically identified mass. The axillary nodes are normal. IMPRESSION: Suspicious newly palpable left breast mass. Indeterminate mass at 3:30 in the left breast which may represent fibrocystic changes versus a solid mass. No other suspicious findings. RECOMMENDATION: Recommend ultrasound-guided biopsy of the newly palpable left breast mass. Recommend ultrasound-guided biopsy of the 3:30 mass. If malignancy is found with biopsy, the patient will need a right mammogram. The right breast has not been imaged since May of 2018. I have discussed the findings and recommendations with the patient. Results were also provided in writing at the conclusion of the visit. If applicable, a reminder letter will be sent to the patient regarding the next appointment. BI-RADS CATEGORY  4: Suspicious. Electronically Signed   By: Dorise Bullion III M.D   On: 12/20/2017 09:18   Mm Diag Breast Tomo Uni Right  Result Date: 12/23/2017 CLINICAL DATA:  RIGHT breast pain.  Suspicious LEFT breast masses. EXAM: DIGITAL DIAGNOSTIC UNILATERAL RIGHT MAMMOGRAM WITH CAD AND TOMO COMPARISON:  12/20/2017 and earlier ACR Breast Density Category b: There are scattered areas of fibroglandular density. FINDINGS: No  suspicious mass, distortion, or microcalcifications are identified to suggest presence of malignancy. Mammographic images were processed with CAD. IMPRESSION: No mammographic evidence for malignancy. RECOMMENDATION: Routine RIGHT mammogram is recommended in 1 year. I have discussed the findings and recommendations with the patient. Results were also provided in writing at the conclusion of the visit. If applicable, a reminder letter will be sent to the patient regarding the next appointment. BI-RADS CATEGORY  4: Suspicious. Electronically Signed   By: Nolon Nations M.D.   On: 12/23/2017 09:19   Mm Clip Placement Left  Result Date: 12/23/2017 CLINICAL DATA:  Status post ultrasound-guided core biopsy of masses in the 3:30 o'clock 11 o'clock locations of the LEFT breast. EXAM: DIAGNOSTIC LEFT MAMMOGRAM POST ULTRASOUND BIOPSY x2 COMPARISON:  Previous exam(s). FINDINGS: Mammographic images were obtained following ultrasound guided biopsy of mass in the 3:30 o'clock location of the LEFT breast and placement of a ribbon shaped clip. Patient also had biopsy of retroareolar mass in the 5 o'clock location of the LEFT breast marked with a coil shaped clip. The clips are in expected locations and 6 centimeters apart. IMPRESSION: Tissue marker clips are in the expected locations after biopsy. Final Assessment: Post Procedure Mammograms for Marker Placement Electronically Signed   By: Nolon Nations M.D.   On: 12/23/2017 09:18   [place] Lt Breast Bx W Loc Dev 1st Lesion Img Bx Spec US Guide  Addendum Date: 12/26/2017   ADDENDUM REPORT: 12/26/2017 07:32 ADDENDUM: Pathology revealed GRADE III INVASIVE DUCTAL CARCINOMA of the Left breast, both locations, 3:30 o'clock 4cm (ribbon clip) and 5 o'clock/retroareolar (coil clip). This was found to be concordant by Dr. Nolon Nations. Pathology results were discussed with the patient by telephone. The patient reported doing well after the biopsies with tenderness at the sites.  Post biopsy instructions and care were reviewed and questions were answered. The patient was encouraged to call [facility] [employer] [facility] for any additional concerns. The patient was referred to [facility] at [facility] on Aril 24, 2019. Pathology results reported by Terie Purser, RN on 12/26/2017. Electronically Signed   By: Nolon Nations M.D.   On: 12/26/2017 07:32   Result Date: 12/26/2017 CLINICAL DATA:  Patient presents  for ultrasound-guided core biopsy of mass in the LEFT breast. EXAM: ULTRASOUND GUIDED LEFT BREAST CORE NEEDLE BIOPSY x2 COMPARISON:  Previous exam(s). FINDINGS: I met with the patient and we discussed the procedure of ultrasound-guided biopsy, including benefits and alternatives. We discussed the high likelihood of a successful procedure. We discussed the risks of the procedure, including infection, bleeding, tissue injury, clip migration, and inadequate sampling. Informed written consent was given. The usual time-out protocol was performed immediately prior to the procedure. Site 1: Lesion quadrant: LOWER OUTER LEFT breast. Using sterile technique and 1% Lidocaine as local anesthetic, under direct ultrasound visualization, a 12 gauge spring-loaded device was used to perform biopsy of mass in the 3:30 o'clock location of the LEFT breast using a LATERAL approach. At the conclusion of the procedure a ribbon shaped tissue marker clip was deployed into the biopsy cavity. Site 2: Lesion QUADRANT: LOWER OUTER LEFT breast. Using sterile technique and 1% lidocaine as local anesthetic, under direct ultrasound visualization a 14 gauge spring-loaded device was used to perform biopsy of mass in the 5 o'clock retroareolar region of the LEFT breast using a LATERAL approach. At the conclusion of procedure a coil shaped tissue marker clip was deployed into the biopsy cavity. Follow up 2 view mammogram was performed and dictated  separately. IMPRESSION: Ultrasound guided biopsy of masses in the 3:30 o'clock and 5 o'clock locations of the LEFT breast. No apparent complications. Electronically Signed: By: Nolon Nations M.D. 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 This was found to be concordant by Dr. Nolon Nations. Pathology results were discussed with the patient by telephone. The patient reported doing well after the biopsies with tenderness at the sites. Post biopsy instructions and care were reviewed and questions were answered. The patient was encouraged to call [facility] for any additional concerns. The patient was referred to [facility] at [facility] on Aril 24, 2019. Pathology results reported by Terie Purser, RN on 12/26/2017. Electronically Signed   By: Nolon Nations M.D.   On: 12/26/2017 07:32   Result Date: 12/26/2017 CLINICAL DATA:  Patient presents for ultrasound-guided core biopsy of mass in the LEFT breast. EXAM: ULTRASOUND GUIDED LEFT BREAST CORE NEEDLE BIOPSY x2 COMPARISON:  Previous exam(s). FINDINGS: I met with the patient and we discussed the procedure of ultrasound-guided biopsy, including benefits and alternatives. We discussed the high likelihood of a successful procedure. We discussed the risks of the procedure, including infection, bleeding, tissue injury, clip migration, and inadequate sampling. Informed written consent was given. The usual time-out protocol was performed immediately prior to the procedure. Site 1: Lesion quadrant: LOWER OUTER LEFT breast. Using sterile technique and 1% Lidocaine as local anesthetic, under direct  ultrasound visualization, a 12 gauge spring-loaded device was used to perform biopsy of mass in the 3:30 o'clock location of the LEFT breast using a LATERAL approach. At the conclusion of the procedure a ribbon shaped tissue marker clip was deployed into the biopsy cavity. Site 2: Lesion QUADRANT: LOWER OUTER LEFT breast. Using sterile technique and 1% lidocaine as local anesthetic, under direct ultrasound visualization a 14 gauge spring-loaded device was used to perform biopsy of mass in the  5 o'clock retroareolar region of the LEFT breast using a LATERAL approach. At the conclusion of procedure a coil shaped tissue marker clip was deployed into the biopsy cavity. Follow up 2 view mammogram was performed and dictated separately. IMPRESSION: Ultrasound guided biopsy of masses in the 3:30 o'clock and 5 o'clock locations of the LEFT breast. No apparent complications. Electronically Signed: By: Nolon Nations M.D. On: 12/23/2017 09:17       IMPRESSION/PLAN: 1. 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 Dr. Lisbeth Renshaw discusses the pathology findings and reviews the nature of invasive breast disease. The consensus from the breast conference included proceeding with MRI of the breast to better understand extent of disease, and consideration of neoadjuvant chemotherapy. If she had significant downsizing of her tumor she may be a candidate for lumpectomy, though her surgical approach would be determined at her post treatment imaging. If she elects for lumpectomy, she would benefit from adjuvant radiotherapy.  Based on her current information, I would not anticipate a need for postmastectomy radiation treatment we discussed the risks, benefits, short, and long term effects of radiotherapy, and the patient is interested in proceeding. I discussed the delivery and logistics of radiotherapy and anticipates a course of 6 1/2 weeks of radiotherapy  with deep inspiration breath hold technique. We will see her back about 2 weeks after surgery as appropriate to discuss the simulation process and anticipate we starting radiotherapy about 4-6 weeks after surgery.   The patient was seen today for 30 minutes, with the majority of the time spent counseling the patient on his diagnosis of cancer and coordinating his care.    ------------------------------------------------  Jodelle Gross, MD, PhD

## 2018-01-02 ENCOUNTER — Telehealth: Payer: Self-pay | Admitting: *Deleted

## 2018-01-02 NOTE — Telephone Encounter (Signed)
Spoke to pt regarding [place] from 4.24.19. Denies questions regarding dx or treatment care plan. Pt informed port will be placed on 5/10 and that employer will send FMLA paperwork by fax. Encourage pt to call with needs. Received verbal understanding.

## 2018-01-07 ENCOUNTER — Ambulatory Visit (HOSPITAL_COMMUNITY)
Admission: RE | Admit: 2018-01-07 | Discharge: 2018-01-07 | Disposition: A | Discharge status: Home / Self Care | Payer: 59 | Source: Ambulatory Visit | Attending: Oncology | Admitting: Oncology

## 2018-01-07 ENCOUNTER — Ambulatory Visit (HOSPITAL_COMMUNITY): Payer: 59

## 2018-01-07 ENCOUNTER — Encounter (HOSPITAL_BASED_OUTPATIENT_CLINIC_OR_DEPARTMENT_OTHER): Payer: Self-pay | Admitting: *Deleted

## 2018-01-07 ENCOUNTER — Other Ambulatory Visit: Payer: Self-pay

## 2018-01-07 ENCOUNTER — Other Ambulatory Visit: Payer: 59

## 2018-01-07 ENCOUNTER — Inpatient Hospital Stay: Payer: 59

## 2018-01-07 DIAGNOSIS — N6323 Unspecified lump in the left breast, lower outer quadrant: Secondary | ICD-10-CM | POA: Diagnosis not present

## 2018-01-07 DIAGNOSIS — I899 Noninfective disorder of lymphatic vessels and lymph nodes, unspecified: Secondary | ICD-10-CM | POA: Diagnosis not present

## 2018-01-07 DIAGNOSIS — C50512 Malignant neoplasm of lower-outer quadrant of left female breast: Secondary | ICD-10-CM | POA: Insufficient documentation

## 2018-01-07 DIAGNOSIS — Z170 Estrogen receptor positive status [ER+]: Principal | ICD-10-CM

## 2018-01-07 DIAGNOSIS — C50912 Malignant neoplasm of unspecified site of left female breast: Secondary | ICD-10-CM | POA: Diagnosis not present

## 2018-01-07 LAB — CMP (CANCER CENTER ONLY)
ALBUMIN: 4.2 g/dL (ref 3.5–5.0)
ALK PHOS: 102 U/L (ref 40–150)
ALT: 18 U/L (ref 0–55)
ANION GAP: 7 (ref 3–11)
AST: 23 U/L (ref 5–34)
BUN: 14 mg/dL (ref 7–26)
CO2: 30 mmol/L — AB (ref 22–29)
Calcium: 9.6 mg/dL (ref 8.4–10.4)
Chloride: 102 mmol/L (ref 98–109)
Creatinine: 0.92 mg/dL (ref 0.60–1.10)
GFR, Est AFR Am: >60 mL/min (ref >60)
GFR, Estimated: >60 mL/min (ref >60)
GLUCOSE: 146 mg/dL — AB (ref 70–140)
POTASSIUM: 3.4 mmol/L — AB (ref 3.5–5.1)
Sodium: 139 mmol/L (ref 136–145)
Total Bilirubin: 0.3 mg/dL (ref 0.2–1.2)
Total Protein: 8 g/dL (ref 6.4–8.3)

## 2018-01-07 LAB — CBC WITH DIFFERENTIAL (CANCER CENTER ONLY)
Basophils Absolute: 0.1 10*3/uL (ref 0.0–0.1)
Basophils Relative: 1 %
Eosinophils Absolute: 0.2 10*3/uL (ref 0.0–0.5)
Eosinophils Relative: 2 %
HEMATOCRIT: 42.4 % (ref 34.8–46.6)
Hemoglobin: 14 g/dL (ref 11.6–15.9)
LYMPHS PCT: 35 %
Lymphs Abs: 2.8 10*3/uL (ref 0.9–3.3)
MCH: 30.8 pg (ref 25.1–34.0)
MCHC: 33 g/dL (ref 31.5–36.0)
MCV: 93.4 fL (ref 79.5–101.0)
MONO ABS: 0.5 10*3/uL (ref 0.1–0.9)
MONOS PCT: 6 %
NEUTROS ABS: 4.5 10*3/uL (ref 1.5–6.5)
Neutrophils Relative %: 56 %
Platelet Count: 230 10*3/uL (ref 145–400)
RBC: 4.54 MIL/uL (ref 3.70–5.45)
RDW: 13.5 % (ref 11.2–14.5)
WBC Count: 8 10*3/uL (ref 3.9–10.3)

## 2018-01-07 LAB — RESEARCH LABS

## 2018-01-07 MED ORDER — GADOBENATE DIMEGLUMINE 529 MG/ML IV SOLN
15.0000 mL | Freq: Once | INTRAVENOUS | Status: AC | PRN
  Administered 2018-01-07: 15 mL via INTRAVENOUS

## 2018-01-07 NOTE — Pre-Procedure Instructions (Signed)
Bring all medications. Coming tomorrow to pick up Ensure.

## 2018-01-08 ENCOUNTER — Ambulatory Visit (HOSPITAL_COMMUNITY)
Admission: RE | Admit: 2018-01-08 | Discharge: 2018-01-08 | Disposition: A | Discharge status: Home / Self Care | Payer: 59 | Source: Ambulatory Visit | Attending: Oncology | Admitting: Oncology

## 2018-01-08 ENCOUNTER — Inpatient Hospital Stay: Payer: 59 | Attending: Oncology

## 2018-01-08 DIAGNOSIS — N920 Excessive and frequent menstruation with regular cycle: Secondary | ICD-10-CM | POA: Insufficient documentation

## 2018-01-08 DIAGNOSIS — Z170 Estrogen receptor positive status [ER+]: Secondary | ICD-10-CM | POA: Insufficient documentation

## 2018-01-08 DIAGNOSIS — Z9049 Acquired absence of other specified parts of digestive tract: Secondary | ICD-10-CM | POA: Insufficient documentation

## 2018-01-08 DIAGNOSIS — I10 Essential (primary) hypertension: Secondary | ICD-10-CM | POA: Insufficient documentation

## 2018-01-08 DIAGNOSIS — C50512 Malignant neoplasm of lower-outer quadrant of left female breast: Secondary | ICD-10-CM

## 2018-01-08 DIAGNOSIS — I119 Hypertensive heart disease without heart failure: Secondary | ICD-10-CM | POA: Insufficient documentation

## 2018-01-08 DIAGNOSIS — D509 Iron deficiency anemia, unspecified: Secondary | ICD-10-CM | POA: Insufficient documentation

## 2018-01-08 DIAGNOSIS — Z801 Family history of malignant neoplasm of trachea, bronchus and lung: Secondary | ICD-10-CM | POA: Insufficient documentation

## 2018-01-08 DIAGNOSIS — R197 Diarrhea, unspecified: Secondary | ICD-10-CM | POA: Insufficient documentation

## 2018-01-08 DIAGNOSIS — R590 Localized enlarged lymph nodes: Secondary | ICD-10-CM | POA: Insufficient documentation

## 2018-01-08 DIAGNOSIS — Z79899 Other long term (current) drug therapy: Secondary | ICD-10-CM | POA: Insufficient documentation

## 2018-01-08 NOTE — Progress Notes (Signed)
  Echocardiogram 2D Echocardiogram has been performed.  Lindsey Wright 01/08/2018, 9:49 AM

## 2018-01-13 ENCOUNTER — Other Ambulatory Visit: Payer: Self-pay | Admitting: *Deleted

## 2018-01-13 ENCOUNTER — Encounter: Payer: Self-pay | Admitting: *Deleted

## 2018-01-13 DIAGNOSIS — R928 Other abnormal and inconclusive findings on diagnostic imaging of breast: Secondary | ICD-10-CM

## 2018-01-13 NOTE — Progress Notes (Signed)
MG

## 2018-01-14 ENCOUNTER — Other Ambulatory Visit: Payer: Self-pay | Admitting: Oncology

## 2018-01-14 ENCOUNTER — Encounter: Payer: Self-pay | Admitting: Oncology

## 2018-01-14 ENCOUNTER — Ambulatory Visit
Admission: RE | Admit: 2018-01-14 | Discharge: 2018-01-14 | Disposition: A | Discharge status: Home / Self Care | Payer: 59 | Source: Ambulatory Visit | Attending: Oncology | Admitting: Oncology

## 2018-01-14 DIAGNOSIS — R928 Other abnormal and inconclusive findings on diagnostic imaging of breast: Secondary | ICD-10-CM

## 2018-01-14 DIAGNOSIS — N6320 Unspecified lump in the left breast, unspecified quadrant: Secondary | ICD-10-CM | POA: Diagnosis not present

## 2018-01-14 DIAGNOSIS — R590 Localized enlarged lymph nodes: Secondary | ICD-10-CM | POA: Diagnosis not present

## 2018-01-14 DIAGNOSIS — C50912 Malignant neoplasm of unspecified site of left female breast: Secondary | ICD-10-CM | POA: Diagnosis not present

## 2018-01-14 NOTE — Progress Notes (Signed)
PT in to pick up Ensure pre op drink, Instructions reviewed.

## 2018-01-14 NOTE — Progress Notes (Signed)
Attempted to call pt to introduce myself as her Arboriculturist and to discuss copay assistance but was unable to get her on the phone.  I will plan to meet her on 01/15/18.

## 2018-01-15 ENCOUNTER — Inpatient Hospital Stay (HOSPITAL_BASED_OUTPATIENT_CLINIC_OR_DEPARTMENT_OTHER): Payer: 59 | Admitting: Oncology

## 2018-01-15 ENCOUNTER — Telehealth: Payer: Self-pay | Admitting: Oncology

## 2018-01-15 VITALS — BP 149/87 | HR 67 | Temp 98.7°F | Resp 18 | Ht 66.0 in | Wt 189.8 lb

## 2018-01-15 DIAGNOSIS — C50512 Malignant neoplasm of lower-outer quadrant of left female breast: Secondary | ICD-10-CM

## 2018-01-15 DIAGNOSIS — I10 Essential (primary) hypertension: Secondary | ICD-10-CM | POA: Diagnosis not present

## 2018-01-15 DIAGNOSIS — R197 Diarrhea, unspecified: Secondary | ICD-10-CM | POA: Diagnosis not present

## 2018-01-15 DIAGNOSIS — N920 Excessive and frequent menstruation with regular cycle: Secondary | ICD-10-CM | POA: Diagnosis not present

## 2018-01-15 DIAGNOSIS — Z801 Family history of malignant neoplasm of trachea, bronchus and lung: Secondary | ICD-10-CM | POA: Diagnosis not present

## 2018-01-15 DIAGNOSIS — D509 Iron deficiency anemia, unspecified: Secondary | ICD-10-CM | POA: Diagnosis not present

## 2018-01-15 DIAGNOSIS — Z9049 Acquired absence of other specified parts of digestive tract: Secondary | ICD-10-CM | POA: Diagnosis not present

## 2018-01-15 DIAGNOSIS — Z79899 Other long term (current) drug therapy: Secondary | ICD-10-CM | POA: Diagnosis not present

## 2018-01-15 DIAGNOSIS — Z170 Estrogen receptor positive status [ER+]: Secondary | ICD-10-CM | POA: Diagnosis not present

## 2018-01-15 DIAGNOSIS — R590 Localized enlarged lymph nodes: Secondary | ICD-10-CM | POA: Diagnosis not present

## 2018-01-15 MED ORDER — LORAZEPAM 0.5 MG PO TABS: 0.5000 mg | ORAL_TABLET | Freq: Every evening | ORAL | 0 refills | Status: DC | PRN

## 2018-01-15 MED ORDER — PROCHLORPERAZINE MALEATE 10 MG PO TABS
10.0000 mg | ORAL_TABLET | Freq: Four times a day (QID) | ORAL | 1 refills | Status: DC | PRN
Start: 2018-01-15 — End: 2018-04-15

## 2018-01-15 MED ORDER — LIDOCAINE-PRILOCAINE 2.5-2.5 % EX CREA: TOPICAL_CREAM | CUTANEOUS | 3 refills | Status: DC

## 2018-01-15 MED ORDER — DEXAMETHASONE 4 MG PO TABS: 8.0000 mg | ORAL_TABLET | Freq: Two times a day (BID) | ORAL | 1 refills | Status: DC

## 2018-01-15 NOTE — Telephone Encounter (Signed)
Scheduled appt per 5/8 los - gave patient AVS and calender per los.

## 2018-01-15 NOTE — Progress Notes (Signed)
[place]  [phone] [phone]) [phone]     ID: Lindsey Wright DOB: [birth year]  MR#: [id number]  PYP#:[id number]  Patient Care Team: Lindsey Greenspan, MD as PCP - General Lindsey Wright, Lindsey Dad, MD as Consulting Physician (Oncology) Lindsey Rea, MD as Consulting Physician (Obstetrics and Gynecology) Lindsey Kussmaul, MD as Consulting Physician (General Surgery) Lindsey Rudd, MD as Consulting Physician (Radiation Oncology) OTHER MD:   CHIEF COMPLAINT: HER-2 positive breast cancer  CURRENT TREATMENT: Neoadjuvant chemotherapy  INTERVAL HISTORY: Lindsey Wright returns today for a follow-up and treatment of her HER-2 positive breast cancer.   Since her last visit she underwent a bilateral breast MRI with and without contrast, breast density category B, showing dominant retroareolar left breast mass measures 2.6 x 2.1 x 2.3 cm with the additional central left breast measuring 0.9 x 1.2 x 0.6 cm. An additional 4 mm irregular enhancing mass is located between these 2 previously biopsied masses and together these 3 masses span a distance of 7.1 cm. Indeterminate left axillary lymph node with asymmetric cortical thickening. No MRI evidence of malignancy in the right breast.   She also underwent a unilateral left breast and axilla U/S on 01/14/2018, showing enlarged left axillary lymph node which biopsy is indicated. Small 4 millimeter nodule also in the LOQ of the left breast is not seen sonographically.   Following the U/S she underwent a lymph node, left axilla biopsy, resulting benign.  This is discordant and she likely will need a second attempt at biopsy  Finally she had an echocardiogram which showed an excellent ejection fraction  REVIEW OF SYSTEMS: Lindsey Wright is doing well overall. She denies unusual headaches, visual changes, nausea, vomiting, or dizziness. There has been no unusual cough, phlegm production, or pleurisy. This been no change in bowel or bladder habits.  She denies unexplained fatigue or unexplained weight loss, bleeding, rash, or fever. A detailed review of systems was otherwise noncontributory.   HISTORY OF CURRENT ILLNESS: From the original intake note:  "Lindsey Wright" palpated a mass in the left breast about 2 weeks ago. She followed up with her gynecologist, Dr. Jean Rosenthal who referred her to the [facility] for mammography. She underwent unilateral left diagnostic mammography with tomography and left breast ultrasonography at [facility] on 12/20/2017 showing: breast density category B. Suspicious newly palpable left breast mass at the 5 o'clock lower outer position with internal blow flow measuring 2.3 x 1.8 x 2.5 cm. There are either 2 adjacent masses or a single complicated mass at the 2:45 lower outer position, 4 cm from the nipple measuring 1.8 x 0.5 by 0.9 cm which may represent fibrocystic changes versus a solid mass. No other suspicious findings. Ultrasound showed normal axillary nodes.  Accordingly on 12/23/2017 she proceeded to biopsy of the left breast areas in question. The pathology from this procedure showed ([id number]): At both the 5 o'clock spanning 1.2 cm and 3:30 position spanning 0.7 cm, invasive ductal carcinoma, grade III. Prognostic indicators significant for: estrogen receptor, 10% positive with weak staining intensity and progesterone receptor, 0% negative. Proliferation marker Ki67 at 90%. HER2 amplified with ratios ER2/CEP17 signals 2.33 and average HER2 copies per cell 4.65  The patient's subsequent history is as detailed below.   PAST MEDICAL HISTORY: Past Medical History:  Diagnosis Date  . Anemia    iron deficient anemia  . History of Bell's palsy     x 2 as teenager and in 20's  . Hypertension   . Menorrhagia   .  Overweight(278.02)     PAST SURGICAL HISTORY: Past Surgical History:  Procedure Laterality Date  . BREAST BIOPSY Right 02/01/2015   benign  . CESAREAN SECTION CLASSICAL    . CHOLECYSTECTOMY      . COSMETIC SURGERY    . TUBAL LIGATION      FAMILY HISTORY Family History  Problem Relation Age of Onset  . Hypertension Mother   . Diabetes Mother   . Stroke Mother   . Alcohol abuse Father   . Cancer Father        lung ca  . Hypertension Father   . Hypertension Sister   . Colon cancer Neg Hx   . Colon polyps Neg Hx   . Rectal cancer Neg Hx   . Stomach cancer Neg Hx    The patient's father was diagnosed with lung cancer at age 23 and died the same year. The patient's mother died at age 43 due to several strokes. The patient had 1 brother who died due to strokes and possibly a MI. The patient has 1 sister. She denies a history of breast or ovarian cancer in the family.    GYNECOLOGIC HISTORY:  No LMP recorded. (Menstrual status: Perimenopausal). Menarche: 52 years old Age at first live birth: 52 years old The patient is GXP2. The patient is not having periods, with her LMP being in 2010. She used oral contraceptive for about 10 years with no complications. She never used HRT.    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 Her husband, [name], works part time in Therapist, art.  The patient's daughter Lindsey Wright age 15, is a Ship broker and starting a job. The patient's daughter Lindsey Wright age 62, is a Ship broker.  Both of the patient's daughters live with her.     ADVANCED DIRECTIVES:    HEALTH MAINTENANCE: Social History   Tobacco Use  . Smoking status: Never Smoker  . Smokeless tobacco: Never Used  Substance Use Topics  . Alcohol use: No    Alcohol/week: 0.0 oz  . Drug use: No     Colonoscopy: 03/27/2017 polyp removal/ Dr. Deeann Saint  PAP: May 2017 normal  Bone density:   Allergies  Allergen Reactions  . Ace Inhibitors Cough    Current Outpatient Medications  Medication Sig Dispense Refill  . dexamethasone (DECADRON) 4 MG tablet Take 2 tablets (8 mg total) by mouth 2 (two) times daily. Start the day before Taxotere. Take once the  day after, then 2 times a day x 2d. 30 tablet 1  . lidocaine-prilocaine (EMLA) cream Apply to affected area once 30 g 3  . LORazepam (ATIVAN) 0.5 MG tablet Take 1 tablet (0.5 mg total) by mouth at bedtime as needed (Nausea or vomiting). 30 tablet 0  . losartan (COZAAR) 50 MG tablet TAKE 1 TABLET (50 MG TOTAL) BY MOUTH DAILY. 30 tablet 11  . potassium chloride SA (KLOR-CON M20) 20 MEQ tablet Take 2 tablets (40 mEq total) by mouth daily. 60 tablet 11  . prochlorperazine (COMPAZINE) 10 MG tablet Take 1 tablet (10 mg total) by mouth every 6 (six) hours as needed (Nausea or vomiting). 30 tablet 1  . triamterene-hydrochlorothiazide (MAXZIDE) 75-50 MG tablet TAKE 1/2 TABLETS BY MOUTH DAILY. 15 tablet 11   No current facility-administered medications for this visit.     OBJECTIVE: Middle-aged African-American woman in no acute distress  Vitals:   01/15/18 1535  BP: (!) 149/87  Pulse: 67  Resp: 18  Temp: 98.7 F (37.1 C)  SpO2: 98%     Body mass index is 30.63 kg/m.   Wt Readings from Last 3 Encounters:  01/15/18 189 lb 12.8 oz (86.1 kg)  01/01/18 189 lb 8 oz (86 kg)  11/11/17 189 lb (85.7 kg)      ECOG FS:0 - Asymptomatic  Sclerae unicteric, EOMs intact Oropharynx clear and moist No cervical or supraclavicular adenopathy Lungs no rales or rhonchi Heart regular rate and rhythm Abd soft, nontender, positive bowel sounds MSK no focal spinal tenderness, no upper extremity lymphedema Neuro: nonfocal, well oriented, appropriate affect Breasts: The right breast is benign.  The larger mass in the left breast is easily palpable in the lower outer quadrant approximately 2 cm from the areola.  The other masses are not palpable.  I do not palpate any axillary masses  LAB RESULTS:  CMP     Component Value Date/Time   NA 139 01/07/2018 1543   K 3.4 (L) 01/07/2018 1543   CL 102 01/07/2018 1543   CO2 30 (H) 01/07/2018 1543   GLUCOSE 146 (H) 01/07/2018 1543   BUN 14 01/07/2018 1543    CREATININE 0.92 01/07/2018 1543   CALCIUM 9.6 01/07/2018 1543   PROT 8.0 01/07/2018 1543   ALBUMIN 4.2 01/07/2018 1543   AST 23 01/07/2018 1543   ALT 18 01/07/2018 1543   ALKPHOS 102 01/07/2018 1543   BILITOT 0.3 01/07/2018 1543   GFRNONAA >60 01/07/2018 1543   GFRAA >60 01/07/2018 1543    No results found for: TOTALPROTELP, ALBUMINELP, A1GS, A2GS, BETS, BETA2SER, GAMS, MSPIKE, SPEI  No results found for: KPAFRELGTCHN, LAMBDASER, KAPLAMBRATIO  Lab Results  Component Value Date   WBC 8.0 01/07/2018   NEUTROABS 4.5 01/07/2018   HGB 14.0 01/07/2018   HCT 42.4 01/07/2018   MCV 93.4 01/07/2018   PLT 230 01/07/2018    '@LASTCHEMISTRY'$ @  No results found for: LABCA2  No components found for: [id number]  No results for input(s): INR in the last 168 hours.  No results found for: LABCA2  No results found for: WUJ811  No results found for: BJY782  No results found for: NFA213  No results found for: CA2729  No components found for: HGQUANT  No results found for: CEA1 / No results found for: CEA1   No results found for: AFPTUMOR  No results found for: CHROMOGRNA  No results found for: PSA1  No visits with results within 3 Day(s) from this visit.  Latest known visit with results is:  Appointment on 01/07/2018  Component Date Value Ref Range Status  . Research Labs 01/07/2018 COLLECTED BY LABORATORY   Final   Performed at [facility], [place] [address], [place], [place] [address]  . Sodium 01/07/2018 139  136 - 145 mmol/L Final  . Potassium 01/07/2018 3.4* 3.5 - 5.1 mmol/L Final  . Chloride 01/07/2018 102  98 - 109 mmol/L Final  . CO2 01/07/2018 30* 22 - 29 mmol/L Final  . Glucose, Bld 01/07/2018 146* 70 - 140 mg/dL Final  . BUN 01/07/2018 14  7 - 26 mg/dL Final  . Creatinine 01/07/2018 0.92  0.60 - 1.10 mg/dL Final  . Calcium 01/07/2018 9.6  8.4 - 10.4 mg/dL Final  . Total Protein 01/07/2018 8.0  6.4 - 8.3 g/dL Final  . Albumin 01/07/2018 4.2   3.5 - 5.0 g/dL Final  . AST 01/07/2018 23  5 - 34 U/L Final  . ALT 01/07/2018 18  0 - 55 U/L Final  . Alkaline Phosphatase 01/07/2018 102  40 - 150 U/L Final  . Total Bilirubin 01/07/2018 0.3  0.2 - 1.2 mg/dL Final  . GFR, Est Non Af Am 01/07/2018 >60  >60 mL/min Final  . GFR, Est AFR Am 01/07/2018 >60  >60 mL/min Final   Comment: (NOTE) The eGFR has been calculated using the CKD EPI equation. This calculation has not been validated in all clinical situations. eGFR's persistently <60 mL/min signify possible Chronic Kidney Disease.   Georgiann Hahn gap 01/07/2018 7  3 - 11 Final   Performed at [facility], [place] [address], [place], [place] [address]  . WBC Count 01/07/2018 8.0  3.9 - 10.3 K/uL Final  . RBC 01/07/2018 4.54  3.70 - 5.45 MIL/uL Final  . Hemoglobin 01/07/2018 14.0  11.6 - 15.9 g/dL Final  . HCT 01/07/2018 42.4  34.8 - 46.6 % Final  . MCV 01/07/2018 93.4  79.5 - 101.0 fL Final  . MCH 01/07/2018 30.8  25.1 - 34.0 pg Final  . MCHC 01/07/2018 33.0  31.5 - 36.0 g/dL Final  . RDW 01/07/2018 13.5  11.2 - 14.5 % Final  . Platelet Count 01/07/2018 230  145 - 400 K/uL Final  . Neutrophils Relative % 01/07/2018 56  % Final  . Neutro Abs 01/07/2018 4.5  1.5 - 6.5 K/uL Final  . Lymphocytes Relative 01/07/2018 35  % Final  . Lymphs Abs 01/07/2018 2.8  0.9 - 3.3 K/uL Final  . Monocytes Relative 01/07/2018 6  % Final  . Monocytes Absolute 01/07/2018 0.5  0.1 - 0.9 K/uL Final  . Eosinophils Relative 01/07/2018 2  % Final  . Eosinophils Absolute 01/07/2018 0.2  0.0 - 0.5 K/uL Final  . Basophils Relative 01/07/2018 1  % Final  . Basophils Absolute 01/07/2018 0.1  0.0 - 0.1 K/uL Final   Performed at [facility], [place] [address], [place], [place] [address]    (this displays the last labs from the last 3 days)  No results found for: TOTALPROTELP, ALBUMINELP, A1GS, A2GS, BETS, BETA2SER, GAMS, MSPIKE, SPEI (this displays SPEP labs)  No results  found for: KPAFRELGTCHN, LAMBDASER, KAPLAMBRATIO (kappa/lambda light chains)  No results found for: HGBA, HGBA2QUANT, HGBFQUANT, HGBSQUAN (Hemoglobinopathy evaluation)   No results found for: LDH  Lab Results  Component Value Date   IRON 104 09/27/2008   IRONPCTSAT 25.1 09/27/2008   (Iron and TIBC)  No results found for: FERRITIN  Urinalysis No results found for: COLORURINE, APPEARANCEUR, LABSPEC, PHURINE, GLUCOSEU, HGBUR, BILIRUBINUR, KETONESUR, PROTEINUR, UROBILINOGEN, NITRITE, LEUKOCYTESUR   STUDIES: Mr Breast Bilateral W Latty Cad  Result Date: 01/08/2018 CLINICAL DATA:  52 year old female with recently diagnosed grade 3 invasive ductal carcinoma of the left breast post ultrasound-guided biopsy of masses at the 330 location and at the 5 o'clock retroareolar location. The 2 sites of malignancy in the left breast were found to be 6 cm apart on the post biopsy clip films. History of prior benign right breast biopsy 2016 of a mass in the lower inner right breast. LABS:  Not applicable EXAM: BILATERAL BREAST MRI WITH AND WITHOUT CONTRAST TECHNIQUE: Multiplanar, multisequence MR images of both breasts were obtained prior to and following the intravenous administration of 15 ml of MultiHance. THREE-DIMENSIONAL MR IMAGE RENDERING ON INDEPENDENT WORKSTATION: Three-dimensional MR images were rendered by post-processing of the original MR data on an independent workstation. The three-dimensional MR images were interpreted, and findings are reported in the following complete MRI report for this study. Three dimensional images were  evaluated at the independent DynaCad workstation COMPARISON:  Previous exam(s). FINDINGS: Breast composition: b.  Scattered fibroglandular tissue. Background parenchymal enhancement: Moderate. Right breast: No suspicious rapidly enhancing masses or abnormal areas of enhancement in the right breast to suggest malignancy. Left breast: Irregular enhancing centrally  necrotic mass containing biopsy clip artifact in the left breast at 5 o'clock retroareolar measures 2.6 cm AP, 2.1 cm transverse and 2.3 cm craniocaudal. The additional biopsied adjacent masses in the central left breast middle depth described to be at the 3:30 position measure 0.9 cm AP, 1.2 cm transverse and 0.6 cm craniocaudal. There is an additional 4 mm irregular enhancing mass located between these 2 biopsied sites of malignancy in the left breast (subtraction image 162). Together all 3 masses span a distance of 7.1 cm. Lymph nodes: There is an indeterminate left axillary lymph node that contains asymmetric cortical thickening(image 71). Ancillary findings:  None. IMPRESSION: 1. Dominant retroareolar left breast mass measures 2.6 x 2.1 x 2.3 cm with the additional central left breast mass measuring 0.9 x 1.2 x 0.6 cm. An additional 4 mm irregular enhancing mass is located between these 2 previously biopsied masses and together these 3 masses span a distance of 7.1 cm. 2. Indeterminate left axillary lymph node with asymmetric cortical thickening. 3.  No MRI evidence of malignancy in the right breast. RECOMMENDATION: 1. Recommend second-look ultrasound of the left axilla (with possible biopsy) for the indeterminate left axillary lymph node with asymmetric cortical thickening. 2. If breast conservation is a consideration, then recommend MRI guided biopsy of the 4 mm irregular enhancing mass located between the 2 previously biopsied sites of malignancy in the left breast. BI-RADS CATEGORY  4: Suspicious. Electronically Signed   By: Everlean Alstrom M.D.   On: 01/08/2018 11:33   US Breast Ltd Uni Left Inc Axilla  Result Date: 01/14/2018 CLINICAL DATA:  The patient returns today after MRI for evaluation of known LEFT breast cancer. The areas of concern are an enlarged LEFT axillary lymph node and a 3rd mass within the LEFT breast, 4 millimeters in diameter and located between the lesions in the 5 o'clock and 3:30  o'clock locations of the LEFT breast. EXAM: ULTRASOUND OF THE LEFT BREAST COMPARISON:  Previous exam(s). FINDINGS: On physical exam, there is minimal residual ecchymosis in the LATERAL portion of the LEFT breast following recent biopsy. I palpate no abnormality in the LEFT axilla. Targeted ultrasound is performed, showing a lymph node in the LOWER most portion of the axilla with cortex measuring 6 millimeters and correlating well with the MRI finding. Within the LOWER OUTER QUADRANT of the LEFT breast, again noted is a bilobed mass in the 3:30 o'clock location LEFT breast 6 centimeters from the nipple, previously biopsied. This lesion was previously annotated at the 3:30 o'clock location 4 centimeters from the nipple. Careful evaluation of the LATERAL portion of the LEFT breast fails to reveal a sonographic correlate for the a 4 millimeter nodule seen on MRI. IMPRESSION: 1. Enlarged LEFT axillary lymph node which biopsy is indicated. Biopsy is performed on the same day and dictated separately. 2. Small 4 millimeter nodule also in the LOWER OUTER QUADRANT of the LEFT breast is not seen sonographically. If needed, MR guided core biopsy would be necessary for diagnosis. RECOMMENDATION: 1. Ultrasound-guided core biopsy of LEFT axillary lymph node. 2. Consider MR biopsy of the 3rd small lesion in the LOWER OUTER QUADRANT of the LEFT breast, between the known sites of malignancy. The known sites of malignancy  are approximately 6 centimeters apart based on clip films performed 12/23/2017. I have discussed the findings and recommendations with the patient. Results were also provided in writing at the conclusion of the visit. If applicable, a reminder letter will be sent to the patient regarding the next appointment. BI-RADS CATEGORY  4: Suspicious. Electronically Signed   By: Nolon Nations M.D.   On: 01/14/2018 08:59   US Breast Ltd Uni Left Inc Axilla  Result Date: 12/20/2017 : Please see the diagnostic mammogram  report from the same day. Electronically Signed   By: Dorise Bullion III M.D   On: 12/20/2017 09:24   Mm Diag Breast Tomo Uni Left  Result Date: 12/20/2017 CLINICAL DATA:  New palpable lump in the left breast. EXAM: DIGITAL DIAGNOSTIC LEFT MAMMOGRAM WITH CAD AND TOMO ULTRASOUND LEFT BREAST COMPARISON:  Previous exam(s). ACR Breast Density Category b: There are scattered areas of fibroglandular density. FINDINGS: There is a new dense subareolar mass being felt by the patient measuring 3 cm. There is another mass or cluster of masses in the lateral inferior left breast at a posterior depth which is new. There is a mass in the medial left breast as well. No other suspicious findings. Mammographic images were processed with CAD. On physical exam, there is a firm subareolar lump. Targeted ultrasound is performed, showing a solid mass in the subareolar region of the left breast at 5 o'clock with internal blood flow measuring 2.3 x 1.8 x 2.5 cm. There are either 2 adjacent masses or a single complicated mass at 9:41, 4 cm from the nipple measuring 1.8 x 0.5 by 0.9 cm. While I suspect fibrocystic changes in this location, the findings are not certain based on ultrasound. There is a cyst at 11 o'clock in the left breast correlating with the third mammographically identified mass. The axillary nodes are normal. IMPRESSION: Suspicious newly palpable left breast mass. Indeterminate mass at 3:30 in the left breast which may represent fibrocystic changes versus a solid mass. No other suspicious findings. RECOMMENDATION: Recommend ultrasound-guided biopsy of the newly palpable left breast mass. Recommend ultrasound-guided biopsy of the 3:30 mass. If malignancy is found with biopsy, the patient will need a right mammogram. The right breast has not been imaged since May of 2018. I have discussed the findings and recommendations with the patient. Results were also provided in writing at the conclusion of the visit. If  applicable, a reminder letter will be sent to the patient regarding the next appointment. BI-RADS CATEGORY  4: Suspicious. Electronically Signed   By: Dorise Bullion III M.D   On: 12/20/2017 09:18   Mm Diag Breast Tomo Uni Right  Result Date: 12/23/2017 CLINICAL DATA:  RIGHT breast pain.  Suspicious LEFT breast masses. EXAM: DIGITAL DIAGNOSTIC UNILATERAL RIGHT MAMMOGRAM WITH CAD AND TOMO COMPARISON:  12/20/2017 and earlier ACR Breast Density Category b: There are scattered areas of fibroglandular density. FINDINGS: No suspicious mass, distortion, or microcalcifications are identified to suggest presence of malignancy. Mammographic images were processed with CAD. IMPRESSION: No mammographic evidence for malignancy. RECOMMENDATION: Routine RIGHT mammogram is recommended in 1 year. I have discussed the findings and recommendations with the patient. Results were also provided in writing at the conclusion of the visit. If applicable, a reminder letter will be sent to the patient regarding the next appointment. BI-RADS CATEGORY  4: Suspicious. Electronically Signed   By: Nolon Nations M.D.   On: 12/23/2017 09:19   [place] Axillary Node Core Biopsy Left  Result Date: 01/14/2018 CLINICAL  DATA:  Known LEFT breast cancer. Patient has enlarged LEFT axillary lymph node by MRI and follow-up ultrasound. EXAM: [place] AXILLARY NODE CORE BIOPSY LEFT COMPARISON:  Previous exam(s). FINDINGS: I met with the patient and we discussed the procedure of ultrasound-guided biopsy, including benefits and alternatives. We discussed the high likelihood of a successful procedure. We discussed the risks of the procedure, including infection, bleeding, tissue injury, clip migration, and inadequate sampling. Informed written consent was given. The usual time-out protocol was performed immediately prior to the procedure. Using sterile technique and 1% Lidocaine as local anesthetic, under direct ultrasound visualization, a 14 gauge spring-loaded  device was used to perform biopsy of enlarged LEFT LOWER axillary lymph node using a LATERAL to MEDIAL approach. At the conclusion of the procedure a spiral shaped HydroMARK tissue marker clip was deployed into the biopsy cavity. Follow up 2 view mammogram was performed and dictated separately. IMPRESSION: Ultrasound guided biopsy of enlarged LEFT axillary lymph node. No apparent complications. Electronically Signed   By: Nolon Nations M.D.   On: 01/14/2018 09:00   Mm Clip Placement Left  Result Date: 01/14/2018 CLINICAL DATA:  Status post biopsy of an enlarged LEFT axillary lymph node in the setting of known malignancy in the LEFT breast. EXAM: DIAGNOSTIC LEFT MAMMOGRAM POST ULTRASOUND BIOPSY COMPARISON:  Previous exam(s). FINDINGS: Mammographic images were obtained following ultrasound guided biopsy of enlarged LEFT axillary lymph node and placement of a HydroMARK spiral shaped clip. Despite numerous attempts, the clip is not visible mammographically due to its depth. Post biopsy changes are identified in the subcutaneous tissues. IMPRESSION: The clip placed at the time of ultrasound-guided core biopsy of enlarged LEFT axilla lymph node is not visible mammographically. Final Assessment: Post Procedure Mammograms for Marker Placement Electronically Signed   By: Nolon Nations M.D.   On: 01/14/2018 09:02   Mm Clip Placement Left  Result Date: 12/23/2017 CLINICAL DATA:  Status post ultrasound-guided core biopsy of masses in the 3:30 o'clock 11 o'clock locations of the LEFT breast. EXAM: DIAGNOSTIC LEFT MAMMOGRAM POST ULTRASOUND BIOPSY x2 COMPARISON:  Previous exam(s). FINDINGS: Mammographic images were obtained following ultrasound guided biopsy of mass in the 3:30 o'clock location of the LEFT breast and placement of a ribbon shaped clip. Patient also had biopsy of retroareolar mass in the 5 o'clock location of the LEFT breast marked with a coil shaped clip. The clips are in expected locations and 6  centimeters apart. IMPRESSION: Tissue marker clips are in the expected locations after biopsy. Final Assessment: Post Procedure Mammograms for Marker Placement Electronically Signed   By: Nolon Nations M.D.   On: 12/23/2017 09:18   [place] Lt Breast Bx W Loc Dev 1st Lesion Img Bx Spec US Guide  Addendum Date: 12/26/2017   ADDENDUM REPORT: 12/26/2017 07:32 ADDENDUM: Pathology revealed GRADE III INVASIVE DUCTAL CARCINOMA of the Left breast, both locations, 3:30 o'clock 4cm (ribbon clip) and 5 o'clock/retroareolar (coil clip). This was found to be concordant by Dr. Nolon Nations. Pathology results were discussed with the patient by telephone. The patient reported doing well after the biopsies with tenderness at the sites. Post biopsy instructions and care were reviewed and questions were answered. The patient was encouraged to call [facility] for any additional concerns. The patient was referred to [facility] at [facility] on Aril 24, 2019. Pathology results reported by Terie Purser, RN on 12/26/2017. Electronically Signed   By: Nolon Nations M.D.  On: 12/26/2017 07:32   Result Date: 12/26/2017 CLINICAL DATA:  Patient presents for ultrasound-guided core biopsy of mass in the LEFT breast. EXAM: ULTRASOUND GUIDED LEFT BREAST CORE NEEDLE BIOPSY x2 COMPARISON:  Previous exam(s). FINDINGS: I met with the patient and we discussed the procedure of ultrasound-guided biopsy, including benefits and alternatives. We discussed the high likelihood of a successful procedure. We discussed the risks of the procedure, including infection, bleeding, tissue injury, clip migration, and inadequate sampling. Informed written consent was given. The usual time-out protocol was performed immediately prior to the procedure. Site 1: Lesion quadrant: LOWER OUTER LEFT breast. Using sterile technique and 1% Lidocaine as local anesthetic, under  direct ultrasound visualization, a 12 gauge spring-loaded device was used to perform biopsy of mass in the 3:30 o'clock location of the LEFT breast using a LATERAL approach. At the conclusion of the procedure a ribbon shaped tissue marker clip was deployed into the biopsy cavity. Site 2: Lesion QUADRANT: LOWER OUTER LEFT breast. Using sterile technique and 1% lidocaine as local anesthetic, under direct ultrasound visualization a 14 gauge spring-loaded device was used to perform biopsy of mass in the 5 o'clock retroareolar region of the LEFT breast using a LATERAL approach. At the conclusion of procedure a coil shaped tissue marker clip was deployed into the biopsy cavity. Follow up 2 view mammogram was performed and dictated separately. IMPRESSION: Ultrasound guided biopsy of masses in the 3:30 o'clock and 5 o'clock locations of the LEFT breast. No apparent complications. Electronically Signed: By: Nolon Nations M.D. 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 This was found to be concordant by Dr. Nolon Nations. Pathology results were discussed with the patient by telephone. The patient reported doing well after the biopsies with tenderness at the sites. Post biopsy instructions and care were reviewed and questions were answered. The patient was encouraged to call [facility] for any additional concerns. The patient was referred to [facility] at [facility] on Aril 24, 2019. Pathology results reported by Terie Purser, RN on 12/26/2017. Electronically Signed   By: Nolon Nations M.D.   On: 12/26/2017 07:32   Result Date: 12/26/2017 CLINICAL DATA:   Patient presents for ultrasound-guided core biopsy of mass in the LEFT breast. EXAM: ULTRASOUND GUIDED LEFT BREAST CORE NEEDLE BIOPSY x2 COMPARISON:  Previous exam(s). FINDINGS: I met with the patient and we discussed the procedure of ultrasound-guided biopsy, including benefits and alternatives. We discussed the high likelihood of a successful procedure. We discussed the risks of the procedure, including infection, bleeding, tissue injury, clip migration, and inadequate sampling. Informed written consent was given. The usual time-out protocol was performed immediately prior to the procedure. Site 1: Lesion quadrant: LOWER OUTER LEFT breast. Using sterile technique and 1% Lidocaine as local anesthetic, under direct ultrasound visualization, a 12 gauge spring-loaded device was used to perform biopsy of mass in the 3:30 o'clock location of the LEFT breast using a LATERAL approach. At the conclusion of the procedure a ribbon shaped tissue marker clip was deployed into the biopsy cavity. Site 2: Lesion QUADRANT: LOWER OUTER LEFT breast. Using sterile technique and 1% lidocaine as local anesthetic, under direct ultrasound visualization a  14 gauge spring-loaded device was used to perform biopsy of mass in the 5 o'clock retroareolar region of the LEFT breast using a LATERAL approach. At the conclusion of procedure a coil shaped tissue marker clip was deployed into the biopsy cavity. Follow up 2 view mammogram was performed and dictated separately. IMPRESSION: Ultrasound guided biopsy of masses in the 3:30 o'clock and 5 o'clock locations of the LEFT breast. No apparent complications. Electronically Signed: By: Nolon Nations M.D. On: 12/23/2017 09:17    ELIGIBLE FOR AVAILABLE RESEARCH PROTOCOL: Exact signs of study  ASSESSMENT: 52 y.o.  [place], [place] woman status post left breast lower outer quadrant biopsy 12/23/2017 for a clinically multifocal T2 N0, stage II invasive ductal carcinoma, grade 3, essentially  estrogen receptor and progesterone receptor negative, but HER-2 amplified, with an MIB-1 of 90%.  (a) breast MRI 01/07/2018 shows the 2.6 cm main mass and 4 additional smaller masses spanning 7.1 cm; there was a suspicious left axillary lymph node  (b) left axillary lymph node biopsy 01/14/2018 was benign/discordant (no lymph node tissue)  (1) neoadjuvant chemotherapy will consist of carboplatin and docetaxel every 21 days for 6 cycles starting 01/20/2018  (2) anti-HER-2 immunotherapy will consist of trastuzumab and Pertuzumab, to be continued for 6 months, starting concurrently with chemotherapy  (a) baseline echocardiogram 01/08/2018 shows an ejection fraction in the 60-65% range  (3) definitive surgery pending  (4) adjuvant radiation as appropriate  (5) consider antiestrogens given the weak receptor positivity  PLAN: Emilygrace was upset to learn that the lymph node biopsy she underwent did not give [place] the information that we needed and she will likely need to have that repeated when she has her port placed this Friday.  Once we got through discussing that (I gave her a copy of the path report and explained that no lymph node tissue was noted) we moved on to the upcoming chemotherapy.  We discussed again the possible toxicities, side effects and complications of the various agents and I gave her a "roadmap" on how to take the supportive meds.  All the appropriate prescriptions were placed in her pharmacy.  In particular she understands that she needs to start the dexamethasone the day before chemo.  I urged her to call us with any problems that she may develop and not wait to let the problem get worse.  I also asked her to keep a diary of symptoms.  What ever happens the first cycle is likely to happen and subsequent cycles so we should be able to be more proactive in those cycles if she keeps some notes on how things go.  I am dropping the dose of the epratuzumab for cycle 1 hoping to  minimize the diarrhea issue.  I have made appointments for her on treatment days and also a week after each treatment.  I have urged her to call us with any problems and particularly if she develops any fever or severe diarrhea  She will meet with our financial advisor today   Gilberte Gorley, Lindsey Dad, MD  01/15/18 3:49 PM Medical Oncology and Hematology [facility] [place], [place] [address] Tel. [phone]    Fax. [phone]  This document serves as a record of services personally performed by Chauncey Cruel, MD. It was created on his behalf by Margit Banda, a trained medical scribe. The creation of this record is based on the scribe's personal observations and the provider's statements to them.   I have reviewed the above  documentation for accuracy and completeness, and I agree with the above.

## 2018-01-16 ENCOUNTER — Other Ambulatory Visit: Payer: Self-pay | Admitting: Oncology

## 2018-01-16 DIAGNOSIS — R599 Enlarged lymph nodes, unspecified: Secondary | ICD-10-CM

## 2018-01-17 ENCOUNTER — Encounter (HOSPITAL_BASED_OUTPATIENT_CLINIC_OR_DEPARTMENT_OTHER)
Admission: RE | Disposition: A | Discharge status: Home / Self Care | Payer: Self-pay | Source: Ambulatory Visit | Attending: General Surgery

## 2018-01-17 ENCOUNTER — Ambulatory Visit (HOSPITAL_BASED_OUTPATIENT_CLINIC_OR_DEPARTMENT_OTHER): Payer: 59 | Admitting: Anesthesiology

## 2018-01-17 ENCOUNTER — Encounter (HOSPITAL_BASED_OUTPATIENT_CLINIC_OR_DEPARTMENT_OTHER): Payer: Self-pay

## 2018-01-17 ENCOUNTER — Ambulatory Visit (HOSPITAL_COMMUNITY): Payer: 59

## 2018-01-17 ENCOUNTER — Other Ambulatory Visit: Payer: Self-pay | Admitting: *Deleted

## 2018-01-17 ENCOUNTER — Ambulatory Visit (HOSPITAL_BASED_OUTPATIENT_CLINIC_OR_DEPARTMENT_OTHER)
Admission: RE | Admit: 2018-01-17 | Discharge: 2018-01-17 | Disposition: A | Discharge status: Home / Self Care | Payer: 59 | Source: Ambulatory Visit | Attending: General Surgery | Admitting: General Surgery

## 2018-01-17 ENCOUNTER — Other Ambulatory Visit: Payer: Self-pay

## 2018-01-17 DIAGNOSIS — C50412 Malignant neoplasm of upper-outer quadrant of left female breast: Secondary | ICD-10-CM | POA: Diagnosis not present

## 2018-01-17 DIAGNOSIS — Z452 Encounter for adjustment and management of vascular access device: Secondary | ICD-10-CM | POA: Diagnosis not present

## 2018-01-17 DIAGNOSIS — Z95828 Presence of other vascular implants and grafts: Secondary | ICD-10-CM

## 2018-01-17 DIAGNOSIS — C50512 Malignant neoplasm of lower-outer quadrant of left female breast: Secondary | ICD-10-CM | POA: Diagnosis not present

## 2018-01-17 DIAGNOSIS — Z170 Estrogen receptor positive status [ER+]: Principal | ICD-10-CM

## 2018-01-17 DIAGNOSIS — Z79899 Other long term (current) drug therapy: Secondary | ICD-10-CM | POA: Diagnosis not present

## 2018-01-17 DIAGNOSIS — I10 Essential (primary) hypertension: Secondary | ICD-10-CM | POA: Insufficient documentation

## 2018-01-17 DIAGNOSIS — Z4682 Encounter for fitting and adjustment of non-vascular catheter: Secondary | ICD-10-CM | POA: Diagnosis not present

## 2018-01-17 HISTORY — PX: PORTACATH PLACEMENT: SHX2246

## 2018-01-17 HISTORY — DX: Personal history of other diseases of the nervous system and sense organs: Z86.69

## 2018-01-17 SURGERY — INSERTION, TUNNELED CENTRAL VENOUS DEVICE, WITH PORT
Anesthesia: General | Site: Chest | Laterality: Right

## 2018-01-17 MED ORDER — PROPOFOL 500 MG/50ML IV EMUL
INTRAVENOUS | Status: AC
  Filled 2018-01-17: qty 50

## 2018-01-17 MED ORDER — ACETAMINOPHEN 500 MG PO TABS
ORAL_TABLET | ORAL | Status: AC
  Filled 2018-01-17: qty 2

## 2018-01-17 MED ORDER — OXYCODONE HCL 5 MG PO TABS: 5.0000 mg | ORAL_TABLET | Freq: Once | ORAL | Status: DC | PRN

## 2018-01-17 MED ORDER — HYDROCODONE-ACETAMINOPHEN 5-325 MG PO TABS: 1.0000 | ORAL_TABLET | Freq: Four times a day (QID) | ORAL | 0 refills | Status: DC | PRN

## 2018-01-17 MED ORDER — CELECOXIB 200 MG PO CAPS
ORAL_CAPSULE | ORAL | Status: AC
  Filled 2018-01-17: qty 1

## 2018-01-17 MED ORDER — GABAPENTIN 300 MG PO CAPS
ORAL_CAPSULE | ORAL | Status: AC
  Filled 2018-01-17: qty 1

## 2018-01-17 MED ORDER — HEPARIN (PORCINE) IN NACL 1000-0.9 UT/500ML-% IV SOLN
INTRAVENOUS | Status: AC
  Filled 2018-01-17: qty 500

## 2018-01-17 MED ORDER — MIDAZOLAM HCL 2 MG/2ML IJ SOLN
INTRAMUSCULAR | Status: AC
  Filled 2018-01-17: qty 2

## 2018-01-17 MED ORDER — FENTANYL CITRATE (PF) 100 MCG/2ML IJ SOLN
50.0000 ug | INTRAMUSCULAR | Status: DC | PRN
  Administered 2018-01-17: 50 ug via INTRAVENOUS

## 2018-01-17 MED ORDER — CEFAZOLIN SODIUM-DEXTROSE 2-4 GM/100ML-% IV SOLN
INTRAVENOUS | Status: AC
  Filled 2018-01-17: qty 100

## 2018-01-17 MED ORDER — CHLORHEXIDINE GLUCONATE CLOTH 2 % EX PADS: 6.0000 | MEDICATED_PAD | Freq: Once | CUTANEOUS | Status: DC

## 2018-01-17 MED ORDER — LIDOCAINE HCL (CARDIAC) PF 100 MG/5ML IV SOSY
PREFILLED_SYRINGE | INTRAVENOUS | Status: AC
  Filled 2018-01-17: qty 5

## 2018-01-17 MED ORDER — DEXAMETHASONE SODIUM PHOSPHATE 4 MG/ML IJ SOLN
INTRAMUSCULAR | Status: DC | PRN
  Administered 2018-01-17: 10 mg via INTRAVENOUS

## 2018-01-17 MED ORDER — PROPOFOL 10 MG/ML IV BOLUS
INTRAVENOUS | Status: DC | PRN
  Administered 2018-01-17: 200 mg via INTRAVENOUS

## 2018-01-17 MED ORDER — BUPIVACAINE HCL (PF) 0.25 % IJ SOLN
INTRAMUSCULAR | Status: DC | PRN
  Administered 2018-01-17: 7 mL

## 2018-01-17 MED ORDER — HEPARIN (PORCINE) IN NACL 2-0.9 UNITS/ML
INTRAMUSCULAR | Status: AC | PRN
  Administered 2018-01-17: 500 mL via INTRAVENOUS

## 2018-01-17 MED ORDER — LIDOCAINE HCL (CARDIAC) PF 100 MG/5ML IV SOSY
PREFILLED_SYRINGE | INTRAVENOUS | Status: DC | PRN
  Administered 2018-01-17: 50 mg via INTRAVENOUS

## 2018-01-17 MED ORDER — MIDAZOLAM HCL 2 MG/2ML IJ SOLN
1.0000 mg | INTRAMUSCULAR | Status: DC | PRN
  Administered 2018-01-17: 2 mg via INTRAVENOUS

## 2018-01-17 MED ORDER — BUPIVACAINE HCL (PF) 0.25 % IJ SOLN
INTRAMUSCULAR | Status: AC
  Filled 2018-01-17: qty 60

## 2018-01-17 MED ORDER — LACTATED RINGERS IV SOLN
INTRAVENOUS | Status: DC
  Administered 2018-01-17: 07:00:00 via INTRAVENOUS

## 2018-01-17 MED ORDER — PROMETHAZINE HCL 25 MG/ML IJ SOLN: 6.2500 mg | INTRAMUSCULAR | Status: DC | PRN

## 2018-01-17 MED ORDER — GABAPENTIN 300 MG PO CAPS
300.0000 mg | ORAL_CAPSULE | ORAL | Status: AC
  Administered 2018-01-17: 300 mg via ORAL

## 2018-01-17 MED ORDER — HEPARIN SOD (PORK) LOCK FLUSH 100 UNIT/ML IV SOLN
INTRAVENOUS | Status: AC
  Filled 2018-01-17: qty 5

## 2018-01-17 MED ORDER — ACETAMINOPHEN 500 MG PO TABS
1000.0000 mg | ORAL_TABLET | ORAL | Status: AC
  Administered 2018-01-17: 1000 mg via ORAL

## 2018-01-17 MED ORDER — ONDANSETRON HCL 4 MG/2ML IJ SOLN
INTRAMUSCULAR | Status: AC
  Filled 2018-01-17: qty 2

## 2018-01-17 MED ORDER — DEXAMETHASONE SODIUM PHOSPHATE 10 MG/ML IJ SOLN
INTRAMUSCULAR | Status: AC
  Filled 2018-01-17: qty 1

## 2018-01-17 MED ORDER — KETOROLAC TROMETHAMINE 30 MG/ML IJ SOLN: 30.0000 mg | Freq: Once | INTRAMUSCULAR | Status: DC | PRN

## 2018-01-17 MED ORDER — SCOPOLAMINE 1 MG/3DAYS TD PT72: 1.0000 | MEDICATED_PATCH | Freq: Once | TRANSDERMAL | Status: DC | PRN

## 2018-01-17 MED ORDER — FENTANYL CITRATE (PF) 100 MCG/2ML IJ SOLN
INTRAMUSCULAR | Status: AC
  Filled 2018-01-17: qty 2

## 2018-01-17 MED ORDER — ONDANSETRON HCL 4 MG/2ML IJ SOLN
INTRAMUSCULAR | Status: DC | PRN
  Administered 2018-01-17: 4 mg via INTRAVENOUS

## 2018-01-17 MED ORDER — CELECOXIB 200 MG PO CAPS
200.0000 mg | ORAL_CAPSULE | ORAL | Status: AC
  Administered 2018-01-17: 200 mg via ORAL

## 2018-01-17 MED ORDER — CEFAZOLIN SODIUM-DEXTROSE 2-4 GM/100ML-% IV SOLN
2.0000 g | INTRAVENOUS | Status: AC
  Administered 2018-01-17: 2 g via INTRAVENOUS

## 2018-01-17 MED ORDER — OXYCODONE HCL 5 MG/5ML PO SOLN: 5.0000 mg | Freq: Once | ORAL | Status: DC | PRN

## 2018-01-17 MED ORDER — FENTANYL CITRATE (PF) 100 MCG/2ML IJ SOLN: 25.0000 ug | INTRAMUSCULAR | Status: DC | PRN

## 2018-01-17 MED ORDER — HEPARIN SOD (PORK) LOCK FLUSH 100 UNIT/ML IV SOLN
INTRAVENOUS | Status: DC | PRN
  Administered 2018-01-17: 500 [IU] via INTRAVENOUS

## 2018-01-17 SURGICAL SUPPLY — 53 items
ADH SKN CLS APL DERMABOND .7 (GAUZE/BANDAGES/DRESSINGS) ×1
BAG DECANTER FOR FLEXI CONT (MISCELLANEOUS) ×3 IMPLANT
BLADE SURG 15 STRL LF DISP TIS (BLADE) ×1 IMPLANT
BLADE SURG 15 STRL SS (BLADE) ×3
CANISTER SUCT 1200ML W/VALVE (MISCELLANEOUS) IMPLANT
CHLORAPREP W/TINT 26ML (MISCELLANEOUS) ×3 IMPLANT
CLEANER CAUTERY TIP 5X5 PAD (MISCELLANEOUS) ×1 IMPLANT
COVER BACK TABLE 60X90IN (DRAPES) ×3 IMPLANT
COVER MAYO STAND STRL (DRAPES) ×3 IMPLANT
DECANTER SPIKE VIAL GLASS SM (MISCELLANEOUS) IMPLANT
DERMABOND ADVANCED (GAUZE/BANDAGES/DRESSINGS) ×2
DERMABOND ADVANCED .7 DNX12 (GAUZE/BANDAGES/DRESSINGS) ×1 IMPLANT
DRAPE C-ARM 42X72 X-RAY (DRAPES) ×3 IMPLANT
DRAPE LAPAROSCOPIC ABDOMINAL (DRAPES) ×3 IMPLANT
DRAPE UTILITY XL STRL (DRAPES) ×3 IMPLANT
ELECT REM PT RETURN 9FT ADLT (ELECTROSURGICAL) ×3
ELECTRODE REM PT RTRN 9FT ADLT (ELECTROSURGICAL) ×1 IMPLANT
GLOVE BIO SURGEON STRL SZ7.5 (GLOVE) ×3 IMPLANT
GLOVE BIOGEL PI IND STRL 7.5 (GLOVE) IMPLANT
GLOVE BIOGEL PI INDICATOR 7.5 (GLOVE) ×2
GLOVE EXAM NITRILE MD LF STRL (GLOVE) ×2 IMPLANT
GLOVE SURG SYN 7.5  E (GLOVE) ×2
GLOVE SURG SYN 7.5 E (GLOVE) ×1 IMPLANT
GLOVE SURG SYN 7.5 PF PI (GLOVE) IMPLANT
GOWN STRL REUS W/ TWL LRG LVL3 (GOWN DISPOSABLE) ×2 IMPLANT
GOWN STRL REUS W/TWL LRG LVL3 (GOWN DISPOSABLE) ×5 IMPLANT
IV KIT MINILOC 20X1 SAFETY (NEEDLE) IMPLANT
KIT PORT POWER 8FR ISP CVUE (Port) ×2 IMPLANT
NDL HYPO 25X1 1.5 SAFETY (NEEDLE) ×1 IMPLANT
NDL SAFETY ECLIPSE 18X1.5 (NEEDLE) IMPLANT
NDL SPNL 22GX3.5 QUINCKE BK (NEEDLE) IMPLANT
NEEDLE HYPO 18GX1.5 SHARP (NEEDLE)
NEEDLE HYPO 22GX1.5 SAFETY (NEEDLE) IMPLANT
NEEDLE HYPO 25X1 1.5 SAFETY (NEEDLE) ×3 IMPLANT
NEEDLE SPNL 22GX3.5 QUINCKE BK (NEEDLE) IMPLANT
PACK BASIN DAY SURGERY FS (CUSTOM PROCEDURE TRAY) ×3 IMPLANT
PAD CLEANER CAUTERY TIP 5X5 (MISCELLANEOUS) ×2
PENCIL BUTTON HOLSTER BLD 10FT (ELECTRODE) ×3 IMPLANT
SLEEVE SCD COMPRESS KNEE MED (MISCELLANEOUS) ×2 IMPLANT
SUT MON AB 4-0 PC3 18 (SUTURE) ×3 IMPLANT
SUT PROLENE 2 0 SH DA (SUTURE) ×3 IMPLANT
SUT SILK 2 0 TIES 17X18 (SUTURE)
SUT SILK 2-0 18XBRD TIE BLK (SUTURE) IMPLANT
SUT VIC AB 3-0 SH 27 (SUTURE) ×3
SUT VIC AB 3-0 SH 27X BRD (SUTURE) ×1 IMPLANT
SYR 10ML LL (SYRINGE) IMPLANT
SYR 5ML LL (SYRINGE) ×3 IMPLANT
SYR CONTROL 10ML LL (SYRINGE) ×4 IMPLANT
TOWEL OR 17X24 6PK STRL BLUE (TOWEL DISPOSABLE) ×6 IMPLANT
TOWEL OR NON WOVEN STRL DISP B (DISPOSABLE) ×1 IMPLANT
TUBE CONNECTING 20'X1/4 (TUBING)
TUBE CONNECTING 20X1/4 (TUBING) IMPLANT
YANKAUER SUCT BULB TIP NO VENT (SUCTIONS) IMPLANT

## 2018-01-17 NOTE — Discharge Instructions (Signed)

## 2018-01-17 NOTE — Transfer of Care (Signed)
Immediate Anesthesia Transfer of Care Note  Patient: Lindsey Wright  Procedure(s) Performed: INSERTION PORT-A-CATH (Right Chest)  Patient Location: PACU  Anesthesia Type:General  Level of Consciousness: sedated  Airway & Oxygen Therapy: Patient Spontanous Breathing and Patient connected to face mask oxygen  Post-op Assessment: Report given to RN and Post -op Vital signs reviewed and stable  Post vital signs: Reviewed and stable  Last Vitals:  Vitals Value Taken Time  BP 123/82 01/17/2018  8:19 AM  Temp    Pulse 85 01/17/2018  8:19 AM  Resp 11 01/17/2018  8:19 AM  SpO2 98 % 01/17/2018  8:19 AM  Vitals shown include unvalidated device data.  Last Pain:  Vitals:   01/17/18 0645  TempSrc: Oral  PainSc: 0-No pain         Complications: No apparent anesthesia complications

## 2018-01-17 NOTE — Anesthesia Postprocedure Evaluation (Signed)
Anesthesia Post Note  Patient: Lindsey Wright  Procedure(s) Performed: INSERTION PORT-A-CATH (Right Chest)     Patient location during evaluation: PACU Anesthesia Type: General Level of consciousness: awake and alert Pain management: pain level controlled Vital Signs Assessment: post-procedure vital signs reviewed and stable Respiratory status: spontaneous breathing, nonlabored ventilation, respiratory function stable and patient connected to nasal cannula oxygen Cardiovascular status: blood pressure returned to baseline and stable Postop Assessment: no apparent nausea or vomiting Anesthetic complications: no    Last Vitals:  Vitals:   01/17/18 0819 01/17/18 0830  BP: 123/82 130/78  Pulse: 85 76  Resp: 11 20  Temp: 36.6 C   SpO2: 98% 97%    Last Pain:  Vitals:   01/17/18 0830  TempSrc:   PainSc: 0-No pain                 Marwan Lipe S

## 2018-01-17 NOTE — Anesthesia Procedure Notes (Signed)
Procedure Name: LMA Insertion Date/Time: 01/17/2018 7:31 AM Performed by: Lieutenant Diego, CRNA Pre-anesthesia Checklist: Patient identified, Emergency Drugs available, Suction available and Patient being monitored Patient Re-evaluated:Patient Re-evaluated prior to induction Oxygen Delivery Method: Circle system utilized Preoxygenation: Pre-oxygenation with 100% oxygen Induction Type: IV induction Ventilation: Mask ventilation without difficulty LMA: LMA inserted LMA Size: 4.0 Number of attempts: 1 Placement Confirmation: positive ETCO2 and breath sounds checked- equal and bilateral Tube secured with: Tape Dental Injury: Teeth and Oropharynx as per pre-operative assessment

## 2018-01-17 NOTE — Anesthesia Preprocedure Evaluation (Signed)
Anesthesia Evaluation  Patient identified by MRN, date of birth, ID band Patient awake    Reviewed: Allergy & Precautions, NPO status , Patient's Chart, lab work & pertinent test results  Airway Mallampati: II  TM Distance: >3 FB Neck ROM: Full    Dental no notable dental hx.    Pulmonary neg pulmonary ROS,    Pulmonary exam normal breath sounds clear to auscultation       Cardiovascular hypertension, Normal cardiovascular exam Rhythm:Regular Rate:Normal     Neuro/Psych negative neurological ROS  negative psych ROS   GI/Hepatic negative GI ROS, Neg liver ROS,   Endo/Other  negative endocrine ROS  Renal/GU negative Renal ROS  negative genitourinary   Musculoskeletal negative musculoskeletal ROS (+)   Abdominal   Peds negative pediatric ROS (+)  Hematology negative hematology ROS (+)   Anesthesia Other Findings   Reproductive/Obstetrics negative OB ROS                             Anesthesia Physical Anesthesia Plan  ASA: II  Anesthesia Plan: General   Post-op Pain Management:    Induction: Intravenous  PONV Risk Score and Plan: 3 and Ondansetron, Dexamethasone, Treatment may vary due to age or medical condition and Midazolam  Airway Management Planned: LMA  Additional Equipment:   Intra-op Plan:   Post-operative Plan: Extubation in OR  Informed Consent: I have reviewed the patients History and Physical, chart, labs and discussed the procedure including the risks, benefits and alternatives for the proposed anesthesia with the patient or authorized representative who has indicated his/her understanding and acceptance.   Dental advisory given  Plan Discussed with: CRNA and Surgeon  Anesthesia Plan Comments:         Anesthesia Quick Evaluation  

## 2018-01-17 NOTE — Op Note (Addendum)
01/17/2018  8:13 AM  PATIENT:  Lindsey Wright  52 y.o. female  PRE-OPERATIVE DIAGNOSIS:  Left breast cancer  POST-OPERATIVE DIAGNOSIS:  Left breast cancer  PROCEDURE:  Procedure(s): INSERTION PORT-A-CATH (Right)  SURGEON:  Surgeon(s) and Role:    * [name], MD - Primary  PHYSICIAN ASSISTANT:   ASSISTANTS: none   ANESTHESIA:   local and general  EBL:  5 mL   BLOOD ADMINISTERED:none  DRAINS: none   LOCAL MEDICATIONS USED:  MARCAINE     SPECIMEN:  No Specimen  DISPOSITION OF SPECIMEN:  N/A  COUNTS:  YES  TOURNIQUET:  * No tourniquets in log *  DICTATION: .Dragon Dictation   After informed consent was obtained the patient was brought to the operating room and placed in the supine position on the operating table.  After adequate induction of general anesthesia roll was placed between the patient's shoulder blades to extend the shoulder slightly.  The right chest and neck area were then prepped with ChloraPrep, allowed to dry, and draped in usual sterile manner.  The patient was placed in Trendelenburg position.  The area of the right chest lateral to the bend of the clavicle was infiltrated with quarter percent Marcaine.  A large bore needle from the Port-A-Cath kit was used to slide beneath the bend of the clavicle on the right chest wall heading towards the sternal notch and in doing so I was able to access the right subclavian vein without difficulty.  A wire was fed through the needle using the Seldinger technique without difficulty.  The wire was confirmed in the central venous system using real-time fluoroscopy.  Next a small incision was made on the right chest wall at the wire entry site with a 15 blade knife.  The incision was carried through the skin and subcutaneous tissue sharply with electrocautery.  A subcutaneous pocket was created inferior to the incision by blunt finger dissection and some sharp dissection with the electrocautery.  Next the tubing was  placed on the reservoir.  The reservoir was placed in the pocket and the length of the tubing was again estimated using real-time fluoroscopy.  The tubing was cut to the appropriate length.  Next a sheath and dilator were fed over the wire also using the Seldinger technique without difficulty.  The dilator and wire were removed from the patient.  The tubing was fed through the sheath as far as it would go and then held in place while the sheath was gently cracked and separated.  Another real-time fluoroscopy image showed the tip of the catheter to be in the distal superior vena cava.  The tubing was then permanently anchored to the reservoir.  The reservoir was anchored in the pocket with two 2-0 Prolene stitches.  The port was then aspirated and it aspirated blood easily.  The port was then flushed initially with a dilute heparin solution and then with a more concentrated heparin solution.  The subcutaneous tissue was then closed over the port with interrupted 3-0 Vicryl stitches.  The skin was then closed with a running 4-0 Monocryl subcuticular stitch.  Dermabond dressings were applied.  The patient tolerated the procedure well.  At the end of the case all needle sponge and instrument counts were correct.  The patient was then awakened and taken to recovery in stable condition.  PLAN OF CARE: Discharge to home after PACU  PATIENT DISPOSITION:  PACU - hemodynamically stable.   Delay start of Pharmacological VTE agent (>24hrs) due  to surgical blood loss or risk of bleeding: not applicable

## 2018-01-17 NOTE — H&P (Signed)
Lindsey Wright  Location: [facility] Patient #: [id number] DOB: Oct 11, 1965 Undefined / Language: Lindsey Wright / Race: Black or African American Female   History of Present Illness The patient is a 52 year old female who presents with breast cancer. We are asked to see the patient in consultation by Dr. Lisbeth Renshaw to evaluate her for a new left breast cancer. The patient is a 52 year old black female who presents with a palpable mass in the lower outer left breast that she just felt. She denies any pain or discharge from nipple. On imaging 2 areas were seen measuring 2.5cm and 1.8cm and were 6cm apart. both were biopsied and were grade 3 IDC that was weakly ER+ and PR - and Her2 + with a Ki67 of 90%.   Problem List/Past Medical  MALIGNANT NEOPLASM OF LOWER-OUTER QUADRANT OF LEFT BREAST OF FEMALE, ESTROGEN RECEPTOR POSITIVE (C50.512)   Allergies Lotensin *ANTIHYPERTENSIVES*  Cough. Ace Inhibitors cause cough  Medication History  Medications Reconciled Cozaar (50MG Tablet, Oral) Active. Potassium Chloride (20MEQ Tablet ER, Oral) Active. Maxzide (75-50MG Tablet, Oral) Active.    Review of Systems  General Not Present- Appetite Loss, Chills, Fatigue, Fever, Night Sweats, Weight Gain and Weight Loss. Note: All other systems negative (unless as noted in HPI & included Review of Systems) Skin Not Present- Change in Wart/Mole, Dryness, Hives, Jaundice, New Lesions, Non-Healing Wounds, Rash and Ulcer. HEENT Not Present- Earache, Hearing Loss, Hoarseness, Nose Bleed, Oral Ulcers, Ringing in the Ears, Seasonal Allergies, Sinus Pain, Sore Throat, Visual Disturbances, Wears glasses/contact lenses and Yellow Eyes. Respiratory Not Present- Bloody sputum, Chronic Cough, Difficulty Breathing, Snoring and Wheezing. Breast Not Present- Breast Mass, Breast Pain, Nipple Discharge and Skin Changes. Cardiovascular Present- Chest Pain. Not Present- Difficulty Breathing Lying Down, Leg Cramps,  Palpitations, Rapid Heart Rate, Shortness of Breath and Swelling of Extremities. Gastrointestinal Not Present- Abdominal Pain, Bloating, Bloody Stool, Change in Bowel Habits, Chronic diarrhea, Constipation, Difficulty Swallowing, Excessive gas, Gets full quickly at meals, Hemorrhoids, Indigestion, Nausea, Rectal Pain and Vomiting. Female Genitourinary Not Present- Frequency, Nocturia, Painful Urination, Pelvic Pain and Urgency. Musculoskeletal Not Present- Back Pain, Joint Pain, Joint Stiffness, Muscle Pain, Muscle Weakness and Swelling of Extremities. Neurological Not Present- Decreased Memory, Fainting, Headaches, Numbness, Seizures, Tingling, Tremor, Trouble walking and Weakness. Psychiatric Not Present- Anxiety, Bipolar, Change in Sleep Pattern, Depression, Fearful and Frequent crying. Endocrine Not Present- Cold Intolerance, Excessive Hunger, Hair Changes, Heat Intolerance, Hot flashes and New Diabetes. Hematology Not Present- Easy Bruising, Excessive bleeding, Gland problems, HIV and Persistent Infections.   Physical Exam General Mental Status-Alert. General Appearance-Consistent with stated age. Hydration-Well hydrated. Voice-Normal.  Head and Neck Head-normocephalic, atraumatic with no lesions or palpable masses. Trachea-midline. Thyroid Gland Characteristics - normal size and consistency.  Eye Eyeball - Bilateral-Extraocular movements intact. Sclera/Conjunctiva - Bilateral-No scleral icterus.  Chest and Lung Exam Chest and lung exam reveals -quiet, even and easy respiratory effort with no use of accessory muscles and on auscultation, normal breath sounds, no adventitious sounds and normal vocal resonance. Inspection Chest Wall - Normal. Back - normal.  Breast Note: there is a 4cm palpable mass in the lower outer central left breast. there is no palpable mass in the right breast. there is no palpable axillary, supraclavicular, or cervical  lymphadenopathy   Cardiovascular Cardiovascular examination reveals -normal heart sounds, regular rate and rhythm with no murmurs and normal pedal pulses bilaterally.  Abdomen Inspection Inspection of the abdomen reveals - No Hernias. Skin - Scar - no surgical  scars. Palpation/Percussion Palpation and Percussion of the abdomen reveal - Soft, Non Tender, No Rebound tenderness, No Rigidity (guarding) and No hepatosplenomegaly. Auscultation Auscultation of the abdomen reveals - Bowel sounds normal.  Neurologic Neurologic evaluation reveals -alert and oriented x 3 with no impairment of recent or remote memory. Mental Status-Normal.  Musculoskeletal Normal Exam - Left-Upper Extremity Strength Normal and Lower Extremity Strength Normal. Normal Exam - Right-Upper Extremity Strength Normal and Lower Extremity Strength Normal.  Lymphatic Head & Neck  General Head & Neck Lymphatics: Bilateral - Description - Normal. Axillary  General Axillary Region: Bilateral - Description - Normal. Tenderness - Non Tender. Femoral & Inguinal  Generalized Femoral & Inguinal Lymphatics: Bilateral - Description - Normal. Tenderness - Non Tender.    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 Since she is Her2+ she will benefit from chemotherapy. she is a good candidate for neoadjuvant treatment. she will need a port for this. I have discussed with her in detail the risks and benefits of the surgery as well as some of the technical aspects and she understands and wishes to proceed Current Plans Follow up with [place] in the office in 2 months.  Call us sooner as needed.  Pt Education - Breast Cancer: discussed with patient and provided information.

## 2018-01-17 NOTE — Interval H&P Note (Signed)
History and Physical Interval Note:  01/17/2018 7:08 AM  Lindsey Wright  has presented today for surgery, with the diagnosis of Left breast cancer  The various methods of treatment have been discussed with the patient and family. After consideration of risks, benefits and other options for treatment, the patient has consented to  Procedure(s): INSERTION PORT-A-CATH (N/A) as a surgical intervention .  The patient's history has been reviewed, patient examined, no change in status, stable for surgery.  I have reviewed the patient's chart and labs.  Questions were answered to the patient's satisfaction.     TOTH III,Jancy Sprankle S

## 2018-01-20 ENCOUNTER — Inpatient Hospital Stay: Payer: 59

## 2018-01-20 ENCOUNTER — Other Ambulatory Visit: Payer: Self-pay | Admitting: Oncology

## 2018-01-20 ENCOUNTER — Telehealth: Payer: Self-pay | Admitting: Oncology

## 2018-01-20 ENCOUNTER — Encounter (HOSPITAL_BASED_OUTPATIENT_CLINIC_OR_DEPARTMENT_OTHER): Payer: Self-pay | Admitting: General Surgery

## 2018-01-20 ENCOUNTER — Encounter: Payer: Self-pay | Admitting: *Deleted

## 2018-01-20 VITALS — BP 132/84 | HR 64 | Temp 98.6°F | Resp 17

## 2018-01-20 DIAGNOSIS — C50512 Malignant neoplasm of lower-outer quadrant of left female breast: Secondary | ICD-10-CM

## 2018-01-20 DIAGNOSIS — Z170 Estrogen receptor positive status [ER+]: Principal | ICD-10-CM

## 2018-01-20 DIAGNOSIS — Z95828 Presence of other vascular implants and grafts: Secondary | ICD-10-CM | POA: Insufficient documentation

## 2018-01-20 LAB — CBC WITH DIFFERENTIAL (CANCER CENTER ONLY)
Basophils Absolute: 0 10*3/uL (ref 0.0–0.1)
Basophils Relative: 0 %
Eosinophils Absolute: 0 10*3/uL (ref 0.0–0.5)
Eosinophils Relative: 0 %
HEMATOCRIT: 40.4 % (ref 34.8–46.6)
HEMOGLOBIN: 13.3 g/dL (ref 11.6–15.9)
LYMPHS ABS: 2.1 10*3/uL (ref 0.9–3.3)
LYMPHS PCT: 10 %
MCH: 30.6 pg (ref 25.1–34.0)
MCHC: 32.9 g/dL (ref 31.5–36.0)
MCV: 92.9 fL (ref 79.5–101.0)
Monocytes Absolute: 1.3 10*3/uL — ABNORMAL HIGH (ref 0.1–0.9)
Monocytes Relative: 6 %
NEUTROS ABS: 17.4 10*3/uL — AB (ref 1.5–6.5)
Neutrophils Relative %: 84 %
Platelet Count: 240 10*3/uL (ref 145–400)
RBC: 4.35 MIL/uL (ref 3.70–5.45)
RDW: 13.6 % (ref 11.2–14.5)
WBC: 20.9 10*3/uL — AB (ref 3.9–10.3)

## 2018-01-20 LAB — CMP (CANCER CENTER ONLY)
ALBUMIN: 4 g/dL (ref 3.5–5.0)
ALT: 20 U/L (ref 0–55)
ANION GAP: 8 (ref 3–11)
AST: 19 U/L (ref 5–34)
Alkaline Phosphatase: 91 U/L (ref 40–150)
BILIRUBIN TOTAL: 0.4 mg/dL (ref 0.2–1.2)
BUN: 21 mg/dL (ref 7–26)
CHLORIDE: 105 mmol/L (ref 98–109)
CO2: 26 mmol/L (ref 22–29)
Calcium: 9.7 mg/dL (ref 8.4–10.4)
Creatinine: 0.93 mg/dL (ref 0.60–1.10)
GFR, Est AFR Am: >60 mL/min (ref >60)
GFR, Estimated: >60 mL/min (ref >60)
Glucose, Bld: 138 mg/dL (ref 70–140)
POTASSIUM: 3.9 mmol/L (ref 3.5–5.1)
Sodium: 139 mmol/L (ref 136–145)
Total Protein: 7.7 g/dL (ref 6.4–8.3)

## 2018-01-20 MED ORDER — SODIUM CHLORIDE 0.9% FLUSH
10.0000 mL | Freq: Once | INTRAVENOUS | Status: AC
  Administered 2018-01-20: 10 mL
  Filled 2018-01-20: qty 10

## 2018-01-20 MED ORDER — DIPHENHYDRAMINE HCL 25 MG PO CAPS
50.0000 mg | ORAL_CAPSULE | Freq: Once | ORAL | Status: AC
  Administered 2018-01-20: 50 mg via ORAL

## 2018-01-20 MED ORDER — PEGFILGRASTIM 6 MG/0.6ML ~~LOC~~ PSKT
6.0000 mg | PREFILLED_SYRINGE | Freq: Once | SUBCUTANEOUS | Status: AC
  Administered 2018-01-20: 6 mg via SUBCUTANEOUS

## 2018-01-20 MED ORDER — SODIUM CHLORIDE 0.9 % IV SOLN
Freq: Once | INTRAVENOUS | Status: AC
  Administered 2018-01-20: 12:00:00 via INTRAVENOUS

## 2018-01-20 MED ORDER — HEPARIN SOD (PORK) LOCK FLUSH 100 UNIT/ML IV SOLN
500.0000 [IU] | Freq: Once | INTRAVENOUS | Status: AC | PRN
  Administered 2018-01-20: 500 [IU]
  Filled 2018-01-20: qty 5

## 2018-01-20 MED ORDER — SODIUM CHLORIDE 0.9 % IV SOLN
420.0000 mg | Freq: Once | INTRAVENOUS | Status: AC
  Administered 2018-01-20: 420 mg via INTRAVENOUS
  Filled 2018-01-20: qty 14

## 2018-01-20 MED ORDER — PALONOSETRON HCL INJECTION 0.25 MG/5ML
0.2500 mg | Freq: Once | INTRAVENOUS | Status: AC
  Administered 2018-01-20: 0.25 mg via INTRAVENOUS

## 2018-01-20 MED ORDER — SODIUM CHLORIDE 0.9 % IV SOLN
75.0000 mg/m2 | Freq: Once | INTRAVENOUS | Status: AC
  Administered 2018-01-20: 150 mg via INTRAVENOUS
  Filled 2018-01-20: qty 15

## 2018-01-20 MED ORDER — PEGFILGRASTIM 6 MG/0.6ML ~~LOC~~ PSKT
PREFILLED_SYRINGE | SUBCUTANEOUS | Status: AC
  Filled 2018-01-20: qty 0.6

## 2018-01-20 MED ORDER — ACETAMINOPHEN 325 MG PO TABS
ORAL_TABLET | ORAL | Status: AC
  Filled 2018-01-20: qty 2

## 2018-01-20 MED ORDER — TRASTUZUMAB CHEMO 150 MG IV SOLR
8.0000 mg/kg | Freq: Once | INTRAVENOUS | Status: AC
  Administered 2018-01-20: 693 mg via INTRAVENOUS
  Filled 2018-01-20: qty 33

## 2018-01-20 MED ORDER — SODIUM CHLORIDE 0.9 % IV SOLN
Freq: Once | INTRAVENOUS | Status: AC
  Administered 2018-01-20: 13:00:00 via INTRAVENOUS
  Filled 2018-01-20: qty 5

## 2018-01-20 MED ORDER — ACETAMINOPHEN 325 MG PO TABS
650.0000 mg | ORAL_TABLET | Freq: Once | ORAL | Status: AC
  Administered 2018-01-20: 650 mg via ORAL

## 2018-01-20 MED ORDER — SODIUM CHLORIDE 0.9 % IV SOLN
611.0000 mg | Freq: Once | INTRAVENOUS | Status: AC
  Administered 2018-01-20: 610 mg via INTRAVENOUS
  Filled 2018-01-20: qty 61

## 2018-01-20 MED ORDER — PALONOSETRON HCL INJECTION 0.25 MG/5ML
INTRAVENOUS | Status: AC
  Filled 2018-01-20: qty 5

## 2018-01-20 MED ORDER — DIPHENHYDRAMINE HCL 25 MG PO CAPS
ORAL_CAPSULE | ORAL | Status: AC
  Filled 2018-01-20: qty 2

## 2018-01-20 MED ORDER — SODIUM CHLORIDE 0.9% FLUSH
10.0000 mL | INTRAVENOUS | Status: DC | PRN
  Administered 2018-01-20: 10 mL
  Filled 2018-01-20: qty 10

## 2018-01-20 NOTE — Telephone Encounter (Signed)
Faxed ROI to [employer] on 01/20/18, Release ID [id number]

## 2018-01-20 NOTE — Patient Instructions (Addendum)
[facility] Discharge Instructions for Patients Receiving Chemotherapy  Today you received the following chemotherapy agents Taxotere, Carboplatin, Herceptin,Perjeta  To help prevent nausea and vomiting after your treatment, we encourage you to take your nausea medication as directed  If you develop nausea and vomiting that is not controlled by your nausea medication, call the clinic.   BELOW ARE SYMPTOMS THAT SHOULD BE REPORTED IMMEDIATELY:  *FEVER GREATER THAN 100.5 F  *CHILLS WITH OR WITHOUT FEVER  NAUSEA AND VOMITING THAT IS NOT CONTROLLED WITH YOUR NAUSEA MEDICATION  *UNUSUAL SHORTNESS OF BREATH  *UNUSUAL BRUISING OR BLEEDING  TENDERNESS IN MOUTH AND THROAT WITH OR WITHOUT PRESENCE OF ULCERS  *URINARY PROBLEMS  *BOWEL PROBLEMS  UNUSUAL RASH Items with * indicate a potential emergency and should be followed up as soon as possible.  Feel free to call the clinic should you have any questions or concerns. The clinic phone number is [phone].  Please show the [facility] at check-in to the Emergency Department and triage nurse.

## 2018-01-21 ENCOUNTER — Other Ambulatory Visit: Payer: 59

## 2018-01-21 NOTE — Progress Notes (Signed)
FMLA successfully faxed to [employer] at [phone]. Mailed a copy to patient address on file.

## 2018-01-22 ENCOUNTER — Inpatient Hospital Stay: Payer: 59

## 2018-01-22 MED ORDER — PEGFILGRASTIM-CBQV 6 MG/0.6ML ~~LOC~~ SOSY
PREFILLED_SYRINGE | SUBCUTANEOUS | Status: AC
  Filled 2018-01-22: qty 0.6

## 2018-01-24 ENCOUNTER — Encounter: Payer: Self-pay | Admitting: *Deleted

## 2018-01-27 ENCOUNTER — Other Ambulatory Visit: Payer: Self-pay | Admitting: Adult Health

## 2018-01-27 DIAGNOSIS — C50512 Malignant neoplasm of lower-outer quadrant of left female breast: Secondary | ICD-10-CM

## 2018-01-27 DIAGNOSIS — Z170 Estrogen receptor positive status [ER+]: Principal | ICD-10-CM

## 2018-01-28 ENCOUNTER — Inpatient Hospital Stay: Payer: 59

## 2018-01-28 ENCOUNTER — Encounter: Payer: Self-pay | Admitting: Adult Health

## 2018-01-28 ENCOUNTER — Encounter: Payer: Self-pay | Admitting: *Deleted

## 2018-01-28 ENCOUNTER — Inpatient Hospital Stay (HOSPITAL_BASED_OUTPATIENT_CLINIC_OR_DEPARTMENT_OTHER): Payer: 59 | Admitting: Adult Health

## 2018-01-28 ENCOUNTER — Telehealth: Payer: Self-pay | Admitting: Adult Health

## 2018-01-28 ENCOUNTER — Telehealth: Payer: Self-pay

## 2018-01-28 VITALS — BP 109/61 | HR 86 | Resp 18 | Ht 66.0 in | Wt 184.1 lb

## 2018-01-28 DIAGNOSIS — D509 Iron deficiency anemia, unspecified: Secondary | ICD-10-CM

## 2018-01-28 DIAGNOSIS — Z79899 Other long term (current) drug therapy: Secondary | ICD-10-CM | POA: Diagnosis not present

## 2018-01-28 DIAGNOSIS — I10 Essential (primary) hypertension: Secondary | ICD-10-CM

## 2018-01-28 DIAGNOSIS — N920 Excessive and frequent menstruation with regular cycle: Secondary | ICD-10-CM

## 2018-01-28 DIAGNOSIS — C50512 Malignant neoplasm of lower-outer quadrant of left female breast: Secondary | ICD-10-CM | POA: Diagnosis not present

## 2018-01-28 DIAGNOSIS — R590 Localized enlarged lymph nodes: Secondary | ICD-10-CM

## 2018-01-28 DIAGNOSIS — Z170 Estrogen receptor positive status [ER+]: Secondary | ICD-10-CM | POA: Diagnosis not present

## 2018-01-28 DIAGNOSIS — Z801 Family history of malignant neoplasm of trachea, bronchus and lung: Secondary | ICD-10-CM

## 2018-01-28 DIAGNOSIS — Z9049 Acquired absence of other specified parts of digestive tract: Secondary | ICD-10-CM

## 2018-01-28 LAB — CBC WITH DIFFERENTIAL (CANCER CENTER ONLY)
Basophils Absolute: 0 10*3/uL (ref 0.0–0.1)
Basophils Relative: 0 %
EOS PCT: 0 %
Eosinophils Absolute: 0 10*3/uL (ref 0.0–0.5)
HEMATOCRIT: 41.9 % (ref 34.8–46.6)
Hemoglobin: 14 g/dL (ref 11.6–15.9)
LYMPHS ABS: 2.8 10*3/uL (ref 0.9–3.3)
LYMPHS PCT: 12 %
MCH: 30.5 pg (ref 25.1–34.0)
MCHC: 33.4 g/dL (ref 31.5–36.0)
MCV: 91.3 fL (ref 79.5–101.0)
MONO ABS: 1.4 10*3/uL — AB (ref 0.1–0.9)
MONOS PCT: 6 %
NEUTROS ABS: 18.8 10*3/uL — AB (ref 1.5–6.5)
Neutrophils Relative %: 82 %
PLATELETS: 197 10*3/uL (ref 145–400)
RBC: 4.59 MIL/uL (ref 3.70–5.45)
RDW: 14.1 % (ref 11.2–14.5)
WBC Count: 23 10*3/uL — ABNORMAL HIGH (ref 3.9–10.3)

## 2018-01-28 LAB — CMP (CANCER CENTER ONLY)
ALT: 31 U/L (ref 0–55)
ANION GAP: 8 (ref 3–11)
AST: 20 U/L (ref 5–34)
Albumin: 3.8 g/dL (ref 3.5–5.0)
Alkaline Phosphatase: 113 U/L (ref 40–150)
BILIRUBIN TOTAL: 0.4 mg/dL (ref 0.2–1.2)
BUN: 19 mg/dL (ref 7–26)
CO2: 28 mmol/L (ref 22–29)
Calcium: 9.2 mg/dL (ref 8.4–10.4)
Chloride: 99 mmol/L (ref 98–109)
Creatinine: 1.28 mg/dL — ABNORMAL HIGH (ref 0.60–1.10)
GFR, Est AFR Am: 55 mL/min — ABNORMAL LOW (ref >60)
GFR, Estimated: 48 mL/min — ABNORMAL LOW (ref >60)
Glucose, Bld: 136 mg/dL (ref 70–140)
POTASSIUM: 3.9 mmol/L (ref 3.5–5.1)
Sodium: 135 mmol/L — ABNORMAL LOW (ref 136–145)
Total Protein: 7.3 g/dL (ref 6.4–8.3)

## 2018-01-28 MED ORDER — ONDANSETRON HCL 8 MG PO TABS: 8.0000 mg | ORAL_TABLET | Freq: Three times a day (TID) | ORAL | 0 refills | Status: DC | PRN

## 2018-01-28 NOTE — Telephone Encounter (Signed)
Per 5/21 no los °

## 2018-01-28 NOTE — Progress Notes (Signed)
[place]  [phone] [phone]     ID: Geryl Dohn Pion DOB: [birth year]  MR#: [id number]  JYN#:[id number]  Patient Care Team: [name], MD as PCP - General Magrinat, [name], MD as Consulting Physician (Oncology) Vania Rea, MD as Consulting Physician (Obstetrics and Gynecology) [name], MD as Consulting Physician (General Surgery) Kyung Rudd, MD as Consulting Physician (Radiation Oncology) OTHER MD:   CHIEF COMPLAINT: HER-2 positive breast cancer  CURRENT TREATMENT: Neoadjuvant chemotherapy  INTERVAL HISTORY: Lindsey Wright returns today for a follow-up and treatment of her HER-2 positive breast cancer. She is cycle 1 day 8 of treatment with neoadjuvant chemotherapy with Docetaxel, Carboplatin, Trastuzumab, Pertuzumab.  Lindsey Wright is fatigued.  She developed nausea the Friday following chemotherapy.  She thinks it is partly due to stopping her anti nausea medications at that time.    REVIEW OF SYSTEMS: Lindsey Wright had diarrhea that started yesterday.  She had about 2-3 loose stools yesterday.  She has had one episode today.  She says she is drinking at least 64 ounces of water per day.  She denies any other issues such as fever, chills, chest pain, palpitations, cough, shortness of breath, headaches, vision issues, neuropathy, or any other concerns.    HISTORY OF CURRENT ILLNESS: From the original intake note:  "Lindsey Wright" palpated a mass in the left breast about 2 weeks ago. She followed up with her gynecologist, Dr. Jean Rosenthal who referred her to [facility] for mammography. She underwent unilateral left diagnostic mammography with tomography and left breast ultrasonography at [facility] on 12/20/2017 showing: breast density category B. Suspicious newly palpable left breast mass at the 5 o'clock lower outer position with internal blow flow measuring 2.3 x 1.8 x 2.5 cm. There are either 2 adjacent masses or a single complicated  mass at the 3:30 lower outer position, 4 cm from the nipple measuring 1.8 x 0.5 by 0.9 cm which may represent fibrocystic changes versus a solid mass. No other suspicious findings. Ultrasound showed normal axillary nodes.  Accordingly on 12/23/2017 she proceeded to biopsy of the left breast areas in question. The pathology from this procedure showed ([id number]): At both the 5 o'clock spanning 1.2 cm and 3:30 position spanning 0.7 cm, invasive ductal carcinoma, grade III. Prognostic indicators significant for: estrogen receptor, 10% positive with weak staining intensity and progesterone receptor, 0% negative. Proliferation marker Ki67 at 90%. HER2 amplified with ratios ER2/CEP17 signals 2.33 and average HER2 copies per cell 4.65  The patient's subsequent history is as detailed below.   PAST MEDICAL HISTORY: Past Medical History:  Diagnosis Date  . Anemia    iron deficient anemia  . History of Bell's palsy     x 2 as teenager and in 20's  . Hypertension   . Menorrhagia   . Overweight(278.02)     PAST SURGICAL HISTORY: Past Surgical History:  Procedure Laterality Date  . BREAST BIOPSY Right 02/01/2015   benign  . CESAREAN SECTION CLASSICAL    . CHOLECYSTECTOMY    . COSMETIC SURGERY    . PORTACATH PLACEMENT Right 01/17/2018   Procedure: INSERTION PORT-A-CATH;  Surgeon: [name], MD;  Location: [facility];  Service: General;  Laterality: Right;  . TUBAL LIGATION      FAMILY HISTORY Family History  Problem Relation Age of Onset  . Hypertension Mother   . Diabetes Mother   . Stroke Mother   . Alcohol abuse Father   . Cancer Father  lung ca  . Hypertension Father   . Hypertension Sister   . Colon cancer Neg Hx   . Colon polyps Neg Hx   . Rectal cancer Neg Hx   . Stomach cancer Neg Hx    The patient's father was diagnosed with lung cancer at age 83 and died the same year. The patient's mother died at age 77 due to several strokes. The patient had 1  brother who died due to strokes and possibly a MI. The patient has 1 sister. She denies a history of breast or ovarian cancer in the family.    GYNECOLOGIC HISTORY:  No LMP recorded. (Menstrual status: Perimenopausal). Menarche: 52 years old Age at first live birth: 52 years old The patient is GXP2. The patient is not having periods, with her LMP being in 2010. She used oral contraceptive for about 10 years with no complications. She never used HRT.    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 Her husband, [name], works part time in Therapist, art.  The patient's daughter [name] age 3, is a Ship broker and starting a job. The patient's daughter [name] age 28, is a Ship broker.  Both of the patient's daughters live with her.     ADVANCED DIRECTIVES:    HEALTH MAINTENANCE: Social History   Tobacco Use  . Smoking status: Never Smoker  . Smokeless tobacco: Never Used  Substance Use Topics  . Alcohol use: No    Alcohol/week: 0.0 oz  . Drug use: No     Colonoscopy: 03/27/2017 polyp removal/ Dr. Deeann Saint  PAP: May 2017 normal  Bone density:   Allergies  Allergen Reactions  . Ace Inhibitors Cough    Current Outpatient Medications  Medication Sig Dispense Refill  . dexamethasone (DECADRON) 4 MG tablet Take 2 tablets (8 mg total) by mouth 2 (two) times daily. Start the day before Taxotere. Take once the day after, then 2 times a day x 2d. 30 tablet 1  . lidocaine-prilocaine (EMLA) cream Apply to affected area once 30 g 3  . LORazepam (ATIVAN) 0.5 MG tablet Take 1 tablet (0.5 mg total) by mouth at bedtime as needed (Nausea or vomiting). 30 tablet 0  . losartan (COZAAR) 50 MG tablet TAKE 1 TABLET (50 MG TOTAL) BY MOUTH DAILY. 30 tablet 11  . potassium chloride SA (KLOR-CON M20) 20 MEQ tablet Take 2 tablets (40 mEq total) by mouth daily. 60 tablet 11  . prochlorperazine (COMPAZINE) 10 MG tablet Take 1 tablet (10 mg total) by mouth every 6 (six) hours  as needed (Nausea or vomiting). 30 tablet 1  . triamterene-hydrochlorothiazide (MAXZIDE) 75-50 MG tablet TAKE 1/2 TABLETS BY MOUTH DAILY. 15 tablet 11  . ondansetron (ZOFRAN) 8 MG tablet Take 1 tablet (8 mg total) by mouth every 8 (eight) hours as needed for nausea or vomiting. 20 tablet 0   No current facility-administered medications for this visit.     OBJECTIVE: Vitals:   01/28/18 1328  BP: 109/61  Pulse: 86  Resp: 18  SpO2: 98%     Body mass index is 29.71 kg/m.   Wt Readings from Last 3 Encounters:  01/28/18 184 lb 1.6 oz (83.5 kg)  01/17/18 189 lb (85.7 kg)  01/15/18 189 lb 12.8 oz (86.1 kg)  ECOG FS:1 GENERAL: Patient is a well appearing female in no acute distress HEENT:  Sclerae anicteric.  Oropharynx clear and moist. No ulcerations or evidence of oropharyngeal candidiasis. Neck is supple.  NODES:  No cervical,  supraclavicular, or axillary lymphadenopathy palpated.  BREAST EXAM:  Deferred. LUNGS:  Clear to auscultation bilaterally.  No wheezes or rhonchi. HEART:  Regular rate and rhythm. No murmur appreciated. ABDOMEN:  Soft, nontender.  Positive, normoactive bowel sounds. No organomegaly palpated. MSK:  No focal spinal tenderness to palpation. Full range of motion bilaterally in the upper extremities. EXTREMITIES:  No peripheral edema.   SKIN:  Clear with no obvious rashes or skin changes. No nail dyscrasia. NEURO:  Nonfocal. Well oriented.  Appropriate affect.    LAB RESULTS:  CMP     Component Value Date/Time   NA 135 (L) 01/28/2018 0919   K 3.9 01/28/2018 0919   CL 99 01/28/2018 0919   CO2 28 01/28/2018 0919   GLUCOSE 136 01/28/2018 0919   BUN 19 01/28/2018 0919   CREATININE 1.28 (H) 01/28/2018 0919   CALCIUM 9.2 01/28/2018 0919   PROT 7.3 01/28/2018 0919   ALBUMIN 3.8 01/28/2018 0919   AST 20 01/28/2018 0919   ALT 31 01/28/2018 0919   ALKPHOS 113 01/28/2018 0919   BILITOT 0.4 01/28/2018 0919   GFRNONAA 48 (L) 01/28/2018 0919   GFRAA 55 (L)  01/28/2018 0919    No results found for: TOTALPROTELP, ALBUMINELP, A1GS, A2GS, BETS, BETA2SER, GAMS, MSPIKE, SPEI  No results found for: KPAFRELGTCHN, LAMBDASER, KAPLAMBRATIO  Lab Results  Component Value Date   WBC 23.0 (H) 01/28/2018   NEUTROABS 18.8 (H) 01/28/2018   HGB 14.0 01/28/2018   HCT 41.9 01/28/2018   MCV 91.3 01/28/2018   PLT 197 01/28/2018    '@LASTCHEMISTRY' @  No results found for: LABCA2  No components found for: TDVVOH607  No results for input(s): INR in the last 168 hours.  No results found for: LABCA2  No results found for: PXT062  No results found for: IRS854  No results found for: OEV035  No results found for: CA2729  No components found for: HGQUANT  No results found for: CEA1 / No results found for: CEA1   No results found for: AFPTUMOR  No results found for: CHROMOGRNA  No results found for: PSA1  Appointment on 01/28/2018  Component Date Value Ref Range Status  . Sodium 01/28/2018 135* 136 - 145 mmol/L Final  . Potassium 01/28/2018 3.9  3.5 - 5.1 mmol/L Final  . Chloride 01/28/2018 99  98 - 109 mmol/L Final  . CO2 01/28/2018 28  22 - 29 mmol/L Final  . Glucose, Bld 01/28/2018 136  70 - 140 mg/dL Final  . BUN 01/28/2018 19  7 - 26 mg/dL Final  . Creatinine 01/28/2018 1.28* 0.60 - 1.10 mg/dL Final  . Calcium 01/28/2018 9.2  8.4 - 10.4 mg/dL Final  . Total Protein 01/28/2018 7.3  6.4 - 8.3 g/dL Final  . Albumin 01/28/2018 3.8  3.5 - 5.0 g/dL Final  . AST 01/28/2018 20  5 - 34 U/L Final  . ALT 01/28/2018 31  0 - 55 U/L Final  . Alkaline Phosphatase 01/28/2018 113  40 - 150 U/L Final  . Total Bilirubin 01/28/2018 0.4  0.2 - 1.2 mg/dL Final  . GFR, Est Non Af Am 01/28/2018 48* >60 mL/min Final  . GFR, Est AFR Am 01/28/2018 55* >60 mL/min Final   Comment: (NOTE) The eGFR has been calculated using the CKD EPI equation. This calculation has not been validated in all clinical situations. eGFR's persistently <60 mL/min signify possible  Chronic Kidney Disease.   . Anion gap 01/28/2018 8  3 - 11 Final   Performed at [facility]  [facility], [place] [address], [place], [place] [address]  . WBC Count 01/28/2018 23.0* 3.9 - 10.3 K/uL Final  . RBC 01/28/2018 4.59  3.70 - 5.45 MIL/uL Final  . Hemoglobin 01/28/2018 14.0  11.6 - 15.9 g/dL Final  . HCT 01/28/2018 41.9  34.8 - 46.6 % Final  . MCV 01/28/2018 91.3  79.5 - 101.0 fL Final  . MCH 01/28/2018 30.5  25.1 - 34.0 pg Final  . MCHC 01/28/2018 33.4  31.5 - 36.0 g/dL Final  . RDW 01/28/2018 14.1  11.2 - 14.5 % Final  . Platelet Count 01/28/2018 197  145 - 400 K/uL Final  . Neutrophils Relative % 01/28/2018 82  % Final  . Neutro Abs 01/28/2018 18.8* 1.5 - 6.5 K/uL Final  . Lymphocytes Relative 01/28/2018 12  % Final  . Lymphs Abs 01/28/2018 2.8  0.9 - 3.3 K/uL Final  . Monocytes Relative 01/28/2018 6  % Final  . Monocytes Absolute 01/28/2018 1.4* 0.1 - 0.9 K/uL Final  . Eosinophils Relative 01/28/2018 0  % Final  . Eosinophils Absolute 01/28/2018 0.0  0.0 - 0.5 K/uL Final  . Basophils Relative 01/28/2018 0  % Final  . Basophils Absolute 01/28/2018 0.0  0.0 - 0.1 K/uL Final   Performed at [facility], [place] [address], [place], [place] [address]    (this displays the last labs from the last 3 days)  No results found for: TOTALPROTELP, ALBUMINELP, A1GS, A2GS, BETS, BETA2SER, GAMS, MSPIKE, SPEI (this displays SPEP labs)  No results found for: KPAFRELGTCHN, LAMBDASER, KAPLAMBRATIO (kappa/lambda light chains)  No results found for: HGBA, HGBA2QUANT, HGBFQUANT, HGBSQUAN (Hemoglobinopathy evaluation)   No results found for: LDH  Lab Results  Component Value Date   IRON 104 09/27/2008   IRONPCTSAT 25.1 09/27/2008   (Iron and TIBC)  No results found for: FERRITIN  Urinalysis No results found for: COLORURINE, APPEARANCEUR, LABSPEC, PHURINE, GLUCOSEU, HGBUR, BILIRUBINUR, KETONESUR, PROTEINUR, UROBILINOGEN, NITRITE,  LEUKOCYTESUR   STUDIES: Mr Breast Bilateral W [place] Cad  Result Date: 01/08/2018 CLINICAL DATA:  52 year old female with recently diagnosed grade 3 invasive ductal carcinoma of the left breast post ultrasound-guided biopsy of masses at the 330 location and at the 5 o'clock retroareolar location. The 2 sites of malignancy in the left breast were found to be 6 cm apart on the post biopsy clip films. History of prior benign right breast biopsy 2016 of a mass in the lower inner right breast. LABS:  Not applicable EXAM: BILATERAL BREAST MRI WITH AND WITHOUT CONTRAST TECHNIQUE: Multiplanar, multisequence MR images of both breasts were obtained prior to and following the intravenous administration of 15 ml of MultiHance. THREE-DIMENSIONAL MR IMAGE RENDERING ON INDEPENDENT WORKSTATION: Three-dimensional MR images were rendered by post-processing of the original MR data on an independent workstation. The three-dimensional MR images were interpreted, and findings are reported in the following complete MRI report for this study. Three dimensional images were evaluated at the independent DynaCad workstation COMPARISON:  Previous exam(s). FINDINGS: Breast composition: b.  Scattered fibroglandular tissue. Background parenchymal enhancement: Moderate. Right breast: No suspicious rapidly enhancing masses or abnormal areas of enhancement in the right breast to suggest malignancy. Left breast: Irregular enhancing centrally necrotic mass containing biopsy clip artifact in the left breast at 5 o'clock retroareolar measures 2.6 cm AP, 2.1 cm transverse and 2.3 cm craniocaudal. The additional biopsied adjacent masses in the central left breast middle depth described to be at the 3:30 position measure 0.9 cm AP, 1.2  cm transverse and 0.6 cm craniocaudal. There is an additional 4 mm irregular enhancing mass located between these 2 biopsied sites of malignancy in the left breast (subtraction image 162). Together all 3 masses  span a distance of 7.1 cm. Lymph nodes: There is an indeterminate left axillary lymph node that contains asymmetric cortical thickening(image 71). Ancillary findings:  None. IMPRESSION: 1. Dominant retroareolar left breast mass measures 2.6 x 2.1 x 2.3 cm with the additional central left breast mass measuring 0.9 x 1.2 x 0.6 cm. An additional 4 mm irregular enhancing mass is located between these 2 previously biopsied masses and together these 3 masses span a distance of 7.1 cm. 2. Indeterminate left axillary lymph node with asymmetric cortical thickening. 3.  No MRI evidence of malignancy in the right breast. RECOMMENDATION: 1. Recommend second-look ultrasound of the left axilla (with possible biopsy) for the indeterminate left axillary lymph node with asymmetric cortical thickening. 2. If breast conservation is a consideration, then recommend MRI guided biopsy of the 4 mm irregular enhancing mass located between the 2 previously biopsied sites of malignancy in the left breast. BI-RADS CATEGORY  4: Suspicious. Electronically Signed   By: Everlean Alstrom M.D.   On: 01/08/2018 11:33   Dg Chest Port 1 View  Result Date: 01/17/2018 CLINICAL DATA:  Port-A-Cath placement. EXAM: PORTABLE CHEST 1 VIEW COMPARISON:  None. FINDINGS: Single view of the chest was obtained. A right subclavian Port-A-Cath has been placed. The catheter tip is along the right side of the mediastinum and probably within the SVC. Central vascular structures are mildly prominent but no overt pulmonary edema. Heart size is within normal limits. The trachea is midline. Negative for a pneumothorax. IMPRESSION: Port-A-Cath tip in the region of the SVC. Negative for pneumothorax. Question mild vascular congestion. Electronically Signed   By: Markus Daft M.D.   On: 01/17/2018 08:58   Dg Fluoro Guide Cv Line-no Report  Result Date: 01/17/2018 Fluoroscopy was utilized by the requesting physician.  No radiographic interpretation.   US Breast Ltd Uni  Left Inc Axilla  Result Date: 01/14/2018 CLINICAL DATA:  The patient returns today after MRI for evaluation of known LEFT breast cancer. The areas of concern are an enlarged LEFT axillary lymph node and a 3rd mass within the LEFT breast, 4 millimeters in diameter and located between the lesions in the 5 o'clock and 3:30 o'clock locations of the LEFT breast. EXAM: ULTRASOUND OF THE LEFT BREAST COMPARISON:  Previous exam(s). FINDINGS: On physical exam, there is minimal residual ecchymosis in the LATERAL portion of the LEFT breast following recent biopsy. I palpate no abnormality in the LEFT axilla. Targeted ultrasound is performed, showing a lymph node in the LOWER most portion of the axilla with cortex measuring 6 millimeters and correlating well with the MRI finding. Within the LOWER OUTER QUADRANT of the LEFT breast, again noted is a bilobed mass in the 3:30 o'clock location LEFT breast 6 centimeters from the nipple, previously biopsied. This lesion was previously annotated at the 3:30 o'clock location 4 centimeters from the nipple. Careful evaluation of the LATERAL portion of the LEFT breast fails to reveal a sonographic correlate for the a 4 millimeter nodule seen on MRI. IMPRESSION: 1. Enlarged LEFT axillary lymph node which biopsy is indicated. Biopsy is performed on the same day and dictated separately. 2. Small 4 millimeter nodule also in the LOWER OUTER QUADRANT of the LEFT breast is not seen sonographically. If needed, MR guided core biopsy would be necessary for diagnosis. RECOMMENDATION:  1. Ultrasound-guided core biopsy of LEFT axillary lymph node. 2. Consider MR biopsy of the 3rd small lesion in the LOWER OUTER QUADRANT of the LEFT breast, between the known sites of malignancy. The known sites of malignancy are approximately 6 centimeters apart based on clip films performed 12/23/2017. I have discussed the findings and recommendations with the patient. Results were also provided in writing at the  conclusion of the visit. If applicable, a reminder letter will be sent to the patient regarding the next appointment. BI-RADS CATEGORY  4: Suspicious. Electronically Signed   By: Nolon Nations M.D.   On: 01/14/2018 08:59   [place] Axillary Node Core Biopsy Left  Addendum Date: 01/16/2018   ADDENDUM REPORT: 01/16/2018 10:25 ADDENDUM: PATHOLOGY OF LEFT AXILLARY LYMPH NODE: BENIGN ADIPOSE TISSUE AND SKELETAL MUSCLE. NO NODAL TISSUE IDENTIFIED WITHIN THE MATERIAL. The findings are felt to be discordant with the imaging appearance. I discussed the findings with Dr. Marlou Starks. Given the lack of nodal tissue, there is still concern regarding metastasis within this lymph node. We discussed the possibility of rebiopsy or seed localization at the time of definitive surgery. Per discussion with Dr. Marlou Starks, he will plan to localize the LEFT axillary lymph node with ultrasound as there is a HydroMARK clip within the lymph node. Additionally, we discussed the need for MR guided biopsy of the 3rd lesion within the LEFT breast. Given the need for mastectomy, he does not feel that additional MR guided core biopsy of LEFT breast is necessary. Electronically Signed   By: Nolon Nations M.D.   On: 01/16/2018 10:25   Result Date: 01/16/2018 CLINICAL DATA:  Known LEFT breast cancer. Patient has enlarged LEFT axillary lymph node by MRI and follow-up ultrasound. EXAM: [place] AXILLARY NODE CORE BIOPSY LEFT COMPARISON:  Previous exam(s). FINDINGS: I met with the patient and we discussed the procedure of ultrasound-guided biopsy, including benefits and alternatives. We discussed the high likelihood of a successful procedure. We discussed the risks of the procedure, including infection, bleeding, tissue injury, clip migration, and inadequate sampling. Informed written consent was given. The usual time-out protocol was performed immediately prior to the procedure. Using sterile technique and 1% Lidocaine as local anesthetic, under direct ultrasound  visualization, a 14 gauge spring-loaded device was used to perform biopsy of enlarged LEFT LOWER axillary lymph node using a LATERAL to MEDIAL approach. At the conclusion of the procedure a spiral shaped HydroMARK tissue marker clip was deployed into the biopsy cavity. Follow up 2 view mammogram was performed and dictated separately. IMPRESSION: Ultrasound guided biopsy of enlarged LEFT axillary lymph node. No apparent complications. Electronically Signed: By: Nolon Nations M.D. On: 01/14/2018 09:00   Mm Clip Placement Left  Result Date: 01/14/2018 CLINICAL DATA:  Status post biopsy of an enlarged LEFT axillary lymph node in the setting of known malignancy in the LEFT breast. EXAM: DIAGNOSTIC LEFT MAMMOGRAM POST ULTRASOUND BIOPSY COMPARISON:  Previous exam(s). FINDINGS: Mammographic images were obtained following ultrasound guided biopsy of enlarged LEFT axillary lymph node and placement of a HydroMARK spiral shaped clip. Despite numerous attempts, the clip is not visible mammographically due to its depth. Post biopsy changes are identified in the subcutaneous tissues. IMPRESSION: The clip placed at the time of ultrasound-guided core biopsy of enlarged LEFT axilla lymph node is not visible mammographically. Final Assessment: Post Procedure Mammograms for Marker Placement Electronically Signed   By: Nolon Nations M.D.   On: 01/14/2018 09:02    ELIGIBLE FOR AVAILABLE RESEARCH PROTOCOL: Exact signs of study  ASSESSMENT: 52 y.o.  [place], [place] woman status post left breast lower outer quadrant biopsy 12/23/2017 for a clinically multifocal T2 N0, stage II invasive ductal carcinoma, grade 3, essentially estrogen receptor and progesterone receptor negative, but HER-2 amplified, with an MIB-1 of 90%.  (a) breast MRI 01/07/2018 shows the 2.6 cm main mass and 4 additional smaller masses spanning 7.1 cm; there was a suspicious left axillary lymph node  (b) left axillary lymph node biopsy 01/14/2018 was  benign/discordant (no lymph node tissue)  (1) neoadjuvant chemotherapy will consist of carboplatin and docetaxel every 21 days for 6 cycles starting 01/20/2018  (2) anti-HER-2 immunotherapy will consist of trastuzumab and Pertuzumab, to be continued for 6 months, starting concurrently with chemotherapy  (a) baseline echocardiogram 01/08/2018 shows an ejection fraction in the 60-65% range  (3) definitive surgery pending  (4) adjuvant radiation as appropriate  (5) consider antiestrogens given the weak receptor positivity  PLAN:  Hildred is doing moderately well today.  She is working and doing well with this.  She is fatigued.  Her labs remain stable and I reviewed those with her. She and I reviewed recommendations.  She does have delayed nausea from her chemotherapy.  I sent in a prescription for Zofran today.  I encouraged her to take this Q8HPRN.  I told her that with future chemotherapy regimens, she should not take Zofran the three days following chemotherapy treatment.  She is drinking fluids well, which is reassuring that she isn't getting dehydrated.  For her loose bowel movements, I recommended she get Imodium, and take 5m with the second loose BM, and then follow package directions.  Should that not work, she knows to call [place] so we can call in additional medications if needed.    VLeyaniwill return in 2 weeks for labs and f/u prior to cycle two of her chemotherapy.  She knows to call for any questions or concerns prior to her next treatment with u[place]    A total of (30) minutes of face-to-face time was spent with this patient with greater than 50% of that time in counseling and care-coordination.   LWilber Bihari NP  01/28/18 2:51 PM [facility] On[facility]  [id number]. [phone]   Fax. [phone]

## 2018-01-28 NOTE — Telephone Encounter (Signed)
-----   Message from Gardenia Phlegm, NP sent at 01/28/2018  2:51 PM EDT ----- Labs show a little bit of dehydration.  Need to ensure she is drinking plenty of water.  We can certainly give her IV fluids tomorrow as well.   ----- Message ----- From: Interface, Lab In [place] Sent: 01/28/2018   9:32 AM To: Gardenia Phlegm, NP

## 2018-01-28 NOTE — Telephone Encounter (Signed)
LVM for patient with lab results showing some dehydration.  Encouraged pt to drink plenty of fluids or may come to center tomorrow for IV fluids per NP.  Left center number for pt to call back.

## 2018-01-29 ENCOUNTER — Other Ambulatory Visit: Payer: Self-pay | Admitting: Oncology

## 2018-02-07 ENCOUNTER — Other Ambulatory Visit: Payer: Self-pay | Admitting: *Deleted

## 2018-02-07 DIAGNOSIS — C50512 Malignant neoplasm of lower-outer quadrant of left female breast: Secondary | ICD-10-CM

## 2018-02-07 DIAGNOSIS — Z170 Estrogen receptor positive status [ER+]: Principal | ICD-10-CM

## 2018-02-10 ENCOUNTER — Inpatient Hospital Stay (HOSPITAL_BASED_OUTPATIENT_CLINIC_OR_DEPARTMENT_OTHER): Payer: 59 | Admitting: Adult Health

## 2018-02-10 ENCOUNTER — Encounter: Payer: Self-pay | Admitting: Adult Health

## 2018-02-10 ENCOUNTER — Encounter: Payer: Self-pay | Admitting: *Deleted

## 2018-02-10 ENCOUNTER — Inpatient Hospital Stay: Payer: 59 | Attending: Adult Health

## 2018-02-10 ENCOUNTER — Inpatient Hospital Stay: Payer: 59

## 2018-02-10 ENCOUNTER — Telehealth: Payer: Self-pay | Admitting: Adult Health

## 2018-02-10 VITALS — BP 145/81 | HR 83 | Temp 98.9°F | Resp 20 | Ht 66.0 in | Wt 191.9 lb

## 2018-02-10 DIAGNOSIS — R55 Syncope and collapse: Secondary | ICD-10-CM | POA: Diagnosis not present

## 2018-02-10 DIAGNOSIS — C50512 Malignant neoplasm of lower-outer quadrant of left female breast: Secondary | ICD-10-CM

## 2018-02-10 DIAGNOSIS — R197 Diarrhea, unspecified: Secondary | ICD-10-CM | POA: Diagnosis not present

## 2018-02-10 DIAGNOSIS — I10 Essential (primary) hypertension: Secondary | ICD-10-CM | POA: Insufficient documentation

## 2018-02-10 DIAGNOSIS — Z170 Estrogen receptor positive status [ER+]: Secondary | ICD-10-CM

## 2018-02-10 DIAGNOSIS — Z5111 Encounter for antineoplastic chemotherapy: Secondary | ICD-10-CM | POA: Insufficient documentation

## 2018-02-10 DIAGNOSIS — Z801 Family history of malignant neoplasm of trachea, bronchus and lung: Secondary | ICD-10-CM | POA: Insufficient documentation

## 2018-02-10 DIAGNOSIS — D509 Iron deficiency anemia, unspecified: Secondary | ICD-10-CM

## 2018-02-10 DIAGNOSIS — Z79899 Other long term (current) drug therapy: Secondary | ICD-10-CM | POA: Insufficient documentation

## 2018-02-10 DIAGNOSIS — Z5112 Encounter for antineoplastic immunotherapy: Secondary | ICD-10-CM

## 2018-02-10 DIAGNOSIS — Z7689 Persons encountering health services in other specified circumstances: Secondary | ICD-10-CM | POA: Insufficient documentation

## 2018-02-10 LAB — CMP (CANCER CENTER ONLY)
ALBUMIN: 3.8 g/dL (ref 3.5–5.0)
ALK PHOS: 113 U/L (ref 40–150)
ALT: 34 U/L (ref 0–55)
ANION GAP: 9 (ref 3–11)
AST: 26 U/L (ref 5–34)
BILIRUBIN TOTAL: 0.3 mg/dL (ref 0.2–1.2)
BUN: 13 mg/dL (ref 7–26)
CALCIUM: 9.4 mg/dL (ref 8.4–10.4)
CO2: 23 mmol/L (ref 22–29)
Chloride: 104 mmol/L (ref 98–109)
Creatinine: 0.97 mg/dL (ref 0.60–1.10)
GFR, Est AFR Am: >60 mL/min (ref >60)
GFR, Estimated: >60 mL/min (ref >60)
GLUCOSE: 252 mg/dL — AB (ref 70–140)
Potassium: 3.9 mmol/L (ref 3.5–5.1)
Sodium: 136 mmol/L (ref 136–145)
TOTAL PROTEIN: 7.1 g/dL (ref 6.4–8.3)

## 2018-02-10 LAB — CBC WITH DIFFERENTIAL (CANCER CENTER ONLY)
BASOS ABS: 0 10*3/uL (ref 0.0–0.1)
BASOS PCT: 0 %
EOS ABS: 0 10*3/uL (ref 0.0–0.5)
EOS PCT: 0 %
HCT: 37.2 % (ref 34.8–46.6)
Hemoglobin: 12.2 g/dL (ref 11.6–15.9)
Lymphocytes Relative: 5 %
Lymphs Abs: 1.3 10*3/uL (ref 0.9–3.3)
MCH: 30.7 pg (ref 25.1–34.0)
MCHC: 32.8 g/dL (ref 31.5–36.0)
MCV: 93.7 fL (ref 79.5–101.0)
MONO ABS: 0.8 10*3/uL (ref 0.1–0.9)
Monocytes Relative: 3 %
NEUTROS ABS: 22.6 10*3/uL — AB (ref 1.5–6.5)
Neutrophils Relative %: 92 %
PLATELETS: 309 10*3/uL (ref 145–400)
RBC: 3.97 MIL/uL (ref 3.70–5.45)
RDW: 14.2 % (ref 11.2–14.5)
WBC Count: 24.7 10*3/uL — ABNORMAL HIGH (ref 3.9–10.3)

## 2018-02-10 MED ORDER — PEGFILGRASTIM 6 MG/0.6ML ~~LOC~~ PSKT
6.0000 mg | PREFILLED_SYRINGE | Freq: Once | SUBCUTANEOUS | Status: AC
  Administered 2018-02-10: 6 mg via SUBCUTANEOUS

## 2018-02-10 MED ORDER — TRASTUZUMAB CHEMO 150 MG IV SOLR
6.0000 mg/kg | Freq: Once | INTRAVENOUS | Status: AC
  Administered 2018-02-10: 525 mg via INTRAVENOUS
  Filled 2018-02-10: qty 25

## 2018-02-10 MED ORDER — SODIUM CHLORIDE 0.9 % IV SOLN
Freq: Once | INTRAVENOUS | Status: AC
  Administered 2018-02-10: 11:00:00 via INTRAVENOUS

## 2018-02-10 MED ORDER — HEPARIN SOD (PORK) LOCK FLUSH 100 UNIT/ML IV SOLN
500.0000 [IU] | Freq: Once | INTRAVENOUS | Status: AC | PRN
  Administered 2018-02-10: 500 [IU]
  Filled 2018-02-10: qty 5

## 2018-02-10 MED ORDER — SODIUM CHLORIDE 0.9 % IV SOLN
591.0000 mg | Freq: Once | INTRAVENOUS | Status: AC
  Administered 2018-02-10: 590 mg via INTRAVENOUS
  Filled 2018-02-10: qty 59

## 2018-02-10 MED ORDER — SODIUM CHLORIDE 0.9 % IV SOLN
420.0000 mg | Freq: Once | INTRAVENOUS | Status: AC
  Administered 2018-02-10: 420 mg via INTRAVENOUS
  Filled 2018-02-10: qty 14

## 2018-02-10 MED ORDER — SODIUM CHLORIDE 0.9% FLUSH
10.0000 mL | INTRAVENOUS | Status: DC | PRN
  Administered 2018-02-10: 10 mL
  Filled 2018-02-10: qty 10

## 2018-02-10 MED ORDER — ACETAMINOPHEN 325 MG PO TABS
650.0000 mg | ORAL_TABLET | Freq: Once | ORAL | Status: AC
  Administered 2018-02-10: 650 mg via ORAL

## 2018-02-10 MED ORDER — ACETAMINOPHEN 325 MG PO TABS
ORAL_TABLET | ORAL | Status: AC
  Filled 2018-02-10: qty 2

## 2018-02-10 MED ORDER — PALONOSETRON HCL INJECTION 0.25 MG/5ML
INTRAVENOUS | Status: AC
  Filled 2018-02-10: qty 5

## 2018-02-10 MED ORDER — SODIUM CHLORIDE 0.9 % IV SOLN
75.0000 mg/m2 | Freq: Once | INTRAVENOUS | Status: AC
  Administered 2018-02-10: 150 mg via INTRAVENOUS
  Filled 2018-02-10: qty 15

## 2018-02-10 MED ORDER — DIPHENHYDRAMINE HCL 25 MG PO CAPS
50.0000 mg | ORAL_CAPSULE | Freq: Once | ORAL | Status: AC
  Administered 2018-02-10: 50 mg via ORAL

## 2018-02-10 MED ORDER — PEGFILGRASTIM 6 MG/0.6ML ~~LOC~~ PSKT
PREFILLED_SYRINGE | SUBCUTANEOUS | Status: AC
  Filled 2018-02-10: qty 0.6

## 2018-02-10 MED ORDER — DIPHENHYDRAMINE HCL 25 MG PO CAPS
ORAL_CAPSULE | ORAL | Status: AC
  Filled 2018-02-10: qty 2

## 2018-02-10 MED ORDER — PALONOSETRON HCL INJECTION 0.25 MG/5ML
0.2500 mg | Freq: Once | INTRAVENOUS | Status: AC
  Administered 2018-02-10: 0.25 mg via INTRAVENOUS

## 2018-02-10 MED ORDER — FOSAPREPITANT DIMEGLUMINE INJECTION 150 MG
Freq: Once | INTRAVENOUS | Status: AC
  Administered 2018-02-10: 12:00:00 via INTRAVENOUS
  Filled 2018-02-10: qty 5

## 2018-02-10 NOTE — Patient Instructions (Signed)
[facility] Discharge Instructions for Patients Receiving Chemotherapy  Today you received the following chemotherapy agents trastuzumab (Herceptin), pertuzumab (Perjeta), docetaxel (Taxotere), and carboplatin (Paraplatin).  To help prevent nausea and vomiting after your treatment, we encourage you to take your nausea medication as directed  If you develop nausea and vomiting that is not controlled by your nausea medication, call the clinic.   BELOW ARE SYMPTOMS THAT SHOULD BE REPORTED IMMEDIATELY:  *FEVER GREATER THAN 100.5 F  *CHILLS WITH OR WITHOUT FEVER  NAUSEA AND VOMITING THAT IS NOT CONTROLLED WITH YOUR NAUSEA MEDICATION  *UNUSUAL SHORTNESS OF BREATH  *UNUSUAL BRUISING OR BLEEDING  TENDERNESS IN MOUTH AND THROAT WITH OR WITHOUT PRESENCE OF ULCERS  *URINARY PROBLEMS  *BOWEL PROBLEMS  UNUSUAL RASH Items with * indicate a potential emergency and should be followed up as soon as possible.  Feel free to call the clinic should you have any questions or concerns. The clinic phone number is [phone].  Please show the [facility] at check-in to the Emergency Department and triage nurse.

## 2018-02-10 NOTE — Telephone Encounter (Signed)
Called pt re resch from 6/24 to 6/25.  Patient at [facility] today.  Called infusion room.  They are going to print out a new schedule for her so she will have the change of appt.

## 2018-02-10 NOTE — Progress Notes (Signed)
Lindsey Wright  [phone] [phone]     ID: Lindsey Wright DOB: 1966-05-30  MR#: [id number]  LDJ#:[id number]  Patient Care Team: [name], MD as PCP - General Magrinat, [name], MD as Consulting Physician (Oncology) Vania Rea, MD as Consulting Physician (Obstetrics and Gynecology) [name], MD as Consulting Physician (General Surgery) Kyung Rudd, MD as Consulting Physician (Radiation Oncology) OTHER MD:   CHIEF COMPLAINT: HER-2 positive breast cancer  CURRENT TREATMENT: Neoadjuvant chemotherapy  INTERVAL HISTORY: Lindsey Wright returns today for a follow-up and treatment of her HER-2 positive breast cancer. She is cycle 2 day 1 of treatment with neoadjuvant chemotherapy with Docetaxel, Carboplatin, Trastuzumab, Pertuzumab.  Lindsey Wright is doing well today.  Her fatigue has improved today.  She does note that her hair came out last Wednesday.  She says it is still coming out.    REVIEW OF SYSTEMS: Lindsey Wright's diarrhea has resolved from a couple of weeks ago.  She had a small rash on her buttocks bilaterally that has since resolved. She denies any pain with this rash or itching.  She had nausea with her last cycle and is nervous about being nauseated since her daughter is graduating from high school on this Saturday.  She is doing well today, and a detailed ROS was non contributory.  She denies fevers, chills, nausea, vomiting, constipation, diarrhea, headaches, chest pain, palpitations, shortness of breath or any other concerns.    HISTORY OF CURRENT ILLNESS: From the original intake note:  "Lindsey Wright" palpated a mass in the left breast about 2 weeks ago. She followed up with her gynecologist, Dr. Jean Rosenthal who referred her to the [facility] for mammography. She underwent unilateral left diagnostic mammography with tomography and left breast ultrasonography at [facility] on 12/20/2017 showing: breast density category B. Suspicious newly  palpable left breast mass at the 5 o'clock lower outer position with internal blow flow measuring 2.3 x 1.8 x 2.5 cm. There are either 2 adjacent masses or a single complicated mass at the 0:30 lower outer position, 4 cm from the nipple measuring 1.8 x 0.5 by 0.9 cm which may represent fibrocystic changes versus a solid mass. No other suspicious findings. Ultrasound showed normal axillary nodes.  Accordingly on 12/23/2017 she proceeded to biopsy of the left breast areas in question. The pathology from this procedure showed ([id number]): At both the 5 o'clock spanning 1.2 cm and 3:30 position spanning 0.7 cm, invasive ductal carcinoma, grade III. Prognostic indicators significant for: estrogen receptor, 10% positive with weak staining intensity and progesterone receptor, 0% negative. Proliferation marker Ki67 at 90%. HER2 amplified with ratios ER2/CEP17 signals 2.33 and average HER2 copies per cell 4.65  The patient's subsequent history is as detailed below.   PAST MEDICAL HISTORY: Past Medical History:  Diagnosis Date  . Anemia    iron deficient anemia  . History of Bell's palsy     x 2 as teenager and in 20's  . Hypertension   . Menorrhagia   . Overweight(278.02)     PAST SURGICAL HISTORY: Past Surgical History:  Procedure Laterality Date  . BREAST BIOPSY Right 02/01/2015   benign  . CESAREAN SECTION CLASSICAL    . CHOLECYSTECTOMY    . COSMETIC SURGERY    . PORTACATH PLACEMENT Right 01/17/2018   Procedure: INSERTION PORT-A-CATH;  Surgeon: [name], MD;  Location: [facility];  Service: General;  Laterality: Right;  . TUBAL LIGATION      FAMILY HISTORY Family History  Problem Relation Age of Onset  . Hypertension Mother   . Diabetes Mother   . Stroke Mother   . Alcohol abuse Father   . Cancer Father        lung ca  . Hypertension Father   . Hypertension Sister   . Colon cancer Neg Hx   . Colon polyps Neg Hx   . Rectal cancer Neg Hx   . Stomach cancer  Neg Hx    The patient's father was diagnosed with lung cancer at age 74 and died the same year. The patient's mother died at age 8 due to several strokes. The patient had 1 brother who died due to strokes and possibly a MI. The patient has 1 sister. She denies a history of breast or ovarian cancer in the family.    GYNECOLOGIC HISTORY:  No LMP recorded. (Menstrual status: Perimenopausal). Menarche: 52 years old Age at first live birth: 52 years old The patient is GXP2. The patient is not having periods, with her LMP being in 2010. She used oral contraceptive for about 10 years with no complications. She never used HRT.    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 Her husband, Lindsey Wright, works part time in Therapist, art.  The patient's daughter Lindsey Wright age 79, is a Ship broker and starting a job. The patient's daughter Lindsey Wright age 68, is a Ship broker.  Both of the patient's daughters live with her.     ADVANCED DIRECTIVES:    HEALTH MAINTENANCE: Social History   Tobacco Use  . Smoking status: Never Smoker  . Smokeless tobacco: Never Used  Substance Use Topics  . Alcohol use: No    Alcohol/week: 0.0 oz  . Drug use: No     Colonoscopy: 03/27/2017 polyp removal/ Dr. Deeann Saint  PAP: May 2017 normal  Bone density:   Allergies  Allergen Reactions  . Ace Inhibitors Cough    Current Outpatient Medications  Medication Sig Dispense Refill  . dexamethasone (DECADRON) 4 MG tablet Take 2 tablets (8 mg total) by mouth 2 (two) times daily. Start the day before Taxotere. Take once the day after, then 2 times a day x 2d. 30 tablet 1  . lidocaine-prilocaine (EMLA) cream Apply to affected area once 30 g 3  . LORazepam (ATIVAN) 0.5 MG tablet Take 1 tablet (0.5 mg total) by mouth at bedtime as needed (Nausea or vomiting). 30 tablet 0  . losartan (COZAAR) 50 MG tablet TAKE 1 TABLET (50 MG TOTAL) BY MOUTH DAILY. 30 tablet 11  . potassium chloride SA (KLOR-CON M20)  20 MEQ tablet Take 2 tablets (40 mEq total) by mouth daily. 60 tablet 11  . prochlorperazine (COMPAZINE) 10 MG tablet Take 1 tablet (10 mg total) by mouth every 6 (six) hours as needed (Nausea or vomiting). 30 tablet 1  . triamterene-hydrochlorothiazide (MAXZIDE) 75-50 MG tablet TAKE 1/2 TABLETS BY MOUTH DAILY. 15 tablet 11   No current facility-administered medications for this visit.     OBJECTIVE: Vitals:   02/10/18 0957  BP: (!) 145/81  Pulse: 83  Resp: 20  Temp: 98.9 F (37.2 C)  SpO2: 98%     Body mass index is 30.97 kg/m.   Wt Readings from Last 3 Encounters:  02/10/18 191 lb 14.4 oz (87 kg)  01/28/18 184 lb 1.6 oz (83.5 kg)  01/17/18 189 lb (85.7 kg)  ECOG FS:1 GENERAL: Patient is a well appearing female in no acute distress HEENT:  Sclerae anicteric.  Oropharynx clear and  moist. No ulcerations or evidence of oropharyngeal candidiasis. Neck is supple.  NODES:  No cervical, supraclavicular, or axillary lymphadenopathy palpated.  BREAST EXAM:  Deferred. LUNGS:  Clear to auscultation bilaterally.  No wheezes or rhonchi. HEART:  Regular rate and rhythm. No murmur appreciated. ABDOMEN:  Soft, nontender.  Positive, normoactive bowel sounds. No organomegaly palpated. MSK:  No focal spinal tenderness to palpation. Full range of motion bilaterally in the upper extremities. EXTREMITIES:  No peripheral edema.   SKIN:  Clear with no obvious rashes or skin changes. No nail dyscrasia. NEURO:  Nonfocal. Well oriented.  Appropriate affect.    LAB RESULTS:  CMP     Component Value Date/Time   NA 135 (L) 01/28/2018 0919   K 3.9 01/28/2018 0919   CL 99 01/28/2018 0919   CO2 28 01/28/2018 0919   GLUCOSE 136 01/28/2018 0919   BUN 19 01/28/2018 0919   CREATININE 1.28 (H) 01/28/2018 0919   CALCIUM 9.2 01/28/2018 0919   PROT 7.3 01/28/2018 0919   ALBUMIN 3.8 01/28/2018 0919   AST 20 01/28/2018 0919   ALT 31 01/28/2018 0919   ALKPHOS 113 01/28/2018 0919   BILITOT 0.4 01/28/2018  0919   GFRNONAA 48 (L) 01/28/2018 0919   GFRAA 55 (L) 01/28/2018 0919    No results found for: TOTALPROTELP, ALBUMINELP, A1GS, A2GS, BETS, BETA2SER, GAMS, MSPIKE, SPEI  No results found for: KPAFRELGTCHN, LAMBDASER, KAPLAMBRATIO  Lab Results  Component Value Date   WBC 24.7 (H) 02/10/2018   NEUTROABS 22.6 (H) 02/10/2018   HGB 12.2 02/10/2018   HCT 37.2 02/10/2018   MCV 93.7 02/10/2018   PLT 309 02/10/2018    _0 @  No results found for: LABCA2  No components found for: WLNLGX211  No results for input(s): INR in the last 168 hours.  No results found for: LABCA2  No results found for: HER740  No results found for: CXK481  No results found for: EHU314  No results found for: CA2729  No components found for: HGQUANT  No results found for: CEA1 / No results found for: CEA1   No results found for: AFPTUMOR  No results found for: CHROMOGRNA  No results found for: PSA1  Appointment on 02/10/2018  Component Date Value Ref Range Status  . WBC Count 02/10/2018 24.7* 3.9 - 10.3 K/uL Final  . RBC 02/10/2018 3.97  3.70 - 5.45 MIL/uL Final  . Hemoglobin 02/10/2018 12.2  11.6 - 15.9 g/dL Final  . HCT 02/10/2018 37.2  34.8 - 46.6 % Final  . MCV 02/10/2018 93.7  79.5 - 101.0 fL Final  . MCH 02/10/2018 30.7  25.1 - 34.0 pg Final  . MCHC 02/10/2018 32.8  31.5 - 36.0 g/dL Final  . RDW 02/10/2018 14.2  11.2 - 14.5 % Final  . Platelet Count 02/10/2018 309  145 - 400 K/uL Final  . Neutrophils Relative % 02/10/2018 92  % Final  . Neutro Abs 02/10/2018 22.6* 1.5 - 6.5 K/uL Final  . Lymphocytes Relative 02/10/2018 5  % Final  . Lymphs Abs 02/10/2018 1.3  0.9 - 3.3 K/uL Final  . Monocytes Relative 02/10/2018 3  % Final  . Monocytes Absolute 02/10/2018 0.8  0.1 - 0.9 K/uL Final  . Eosinophils Relative 02/10/2018 0  % Final  . Eosinophils Absolute 02/10/2018 0.0  0.0 - 0.5 K/uL Final  . Basophils Relative 02/10/2018 0  % Final  . Basophils Absolute 02/10/2018 0.0  0.0 -  0.1 K/uL Final   Performed at [facility], [place] [address] [place], [place] [address]    (this displays the last labs from the last 3 days)  No results found for: TOTALPROTELP, ALBUMINELP, A1GS, A2GS, BETS, BETA2SER, GAMS, MSPIKE, SPEI (this displays SPEP labs)  No results found for: KPAFRELGTCHN, LAMBDASER, KAPLAMBRATIO (kappa/lambda light chains)  No results found for: HGBA, HGBA2QUANT, HGBFQUANT, HGBSQUAN (Hemoglobinopathy evaluation)   No results found for: LDH  Lab Results  Component Value Date   IRON 104 09/27/2008   IRONPCTSAT 25.1 09/27/2008   (Iron and TIBC)  No results found for: FERRITIN  Urinalysis No results found for: COLORURINE, APPEARANCEUR, LABSPEC, PHURINE, GLUCOSEU, HGBUR, BILIRUBINUR, KETONESUR, PROTEINUR, UROBILINOGEN, NITRITE, LEUKOCYTESUR   STUDIES: Dg Chest Port 1 View  Result Date: 01/17/2018 CLINICAL DATA:  Port-A-Cath placement. EXAM: PORTABLE CHEST 1 VIEW COMPARISON:  None. FINDINGS: Single view of the chest was obtained. A right subclavian Port-A-Cath has been placed. The catheter tip is along the right side of the mediastinum and probably within the SVC. Central vascular structures are mildly prominent but no overt pulmonary edema. Heart size is within normal limits. The trachea is midline. Negative for a pneumothorax. IMPRESSION: Port-A-Cath tip in the region of the SVC. Negative for pneumothorax. Question mild vascular congestion. Electronically Signed   By: Markus Daft M.D.   On: 01/17/2018 08:58   Dg Fluoro Guide Cv Line-no Report  Result Date: 01/17/2018 Fluoroscopy was utilized by the requesting physician.  No radiographic interpretation.   US Breast Ltd Uni Left Inc Axilla  Result Date: 01/14/2018 CLINICAL DATA:  The patient returns today after MRI for evaluation of known LEFT breast cancer. The areas of concern are an enlarged LEFT axillary lymph node and a 3rd mass within the LEFT breast, 4 millimeters in diameter  and located between the lesions in the 5 o'clock and 3:30 o'clock locations of the LEFT breast. EXAM: ULTRASOUND OF THE LEFT BREAST COMPARISON:  Previous exam(s). FINDINGS: On physical exam, there is minimal residual ecchymosis in the LATERAL portion of the LEFT breast following recent biopsy. I palpate no abnormality in the LEFT axilla. Targeted ultrasound is performed, showing a lymph node in the LOWER most portion of the axilla with cortex measuring 6 millimeters and correlating well with the MRI finding. Within the LOWER OUTER QUADRANT of the LEFT breast, again noted is a bilobed mass in the 3:30 o'clock location LEFT breast 6 centimeters from the nipple, previously biopsied. This lesion was previously annotated at the 3:30 o'clock location 4 centimeters from the nipple. Careful evaluation of the LATERAL portion of the LEFT breast fails to reveal a sonographic correlate for the a 4 millimeter nodule seen on MRI. IMPRESSION: 1. Enlarged LEFT axillary lymph node which biopsy is indicated. Biopsy is performed on the same day and dictated separately. 2. Small 4 millimeter nodule also in the LOWER OUTER QUADRANT of the LEFT breast is not seen sonographically. If needed, MR guided core biopsy would be necessary for diagnosis. RECOMMENDATION: 1. Ultrasound-guided core biopsy of LEFT axillary lymph node. 2. Consider MR biopsy of the 3rd small lesion in the LOWER OUTER QUADRANT of the LEFT breast, between the known sites of malignancy. The known sites of malignancy are approximately 6 centimeters apart based on clip films performed 12/23/2017. I have discussed the findings and recommendations with the patient. Results were also provided in writing at the conclusion of the visit. If applicable, a reminder letter will be sent to the patient regarding the next appointment. BI-RADS CATEGORY  4: Suspicious. Electronically Signed  By: Nolon Nations M.D.   On: 01/14/2018 08:59   [place] Axillary Node Core Biopsy  Left  Addendum Date: 01/16/2018   ADDENDUM REPORT: 01/16/2018 10:25 ADDENDUM: PATHOLOGY OF LEFT AXILLARY LYMPH NODE: BENIGN ADIPOSE TISSUE AND SKELETAL MUSCLE. NO NODAL TISSUE IDENTIFIED WITHIN THE MATERIAL. The findings are felt to be discordant with the imaging appearance. I discussed the findings with Dr. Marlou Starks. Given the lack of nodal tissue, there is still concern regarding metastasis within this lymph node. We discussed the possibility of rebiopsy or seed localization at the time of definitive surgery. Per discussion with Dr. Marlou Starks, he will plan to localize the LEFT axillary lymph node with ultrasound as there is a HydroMARK clip within the lymph node. Additionally, we discussed the need for MR guided biopsy of the 3rd lesion within the LEFT breast. Given the need for mastectomy, he does not feel that additional MR guided core biopsy of LEFT breast is necessary. Electronically Signed   By: Nolon Nations M.D.   On: 01/16/2018 10:25   Result Date: 01/16/2018 CLINICAL DATA:  Known LEFT breast cancer. Patient has enlarged LEFT axillary lymph node by MRI and follow-up ultrasound. EXAM: [place] AXILLARY NODE CORE BIOPSY LEFT COMPARISON:  Previous exam(s). FINDINGS: I met with the patient and we discussed the procedure of ultrasound-guided biopsy, including benefits and alternatives. We discussed the high likelihood of a successful procedure. We discussed the risks of the procedure, including infection, bleeding, tissue injury, clip migration, and inadequate sampling. Informed written consent was given. The usual time-out protocol was performed immediately prior to the procedure. Using sterile technique and 1% Lidocaine as local anesthetic, under direct ultrasound visualization, a 14 gauge spring-loaded device was used to perform biopsy of enlarged LEFT LOWER axillary lymph node using a LATERAL to MEDIAL approach. At the conclusion of the procedure a spiral shaped HydroMARK tissue marker clip was deployed into the  biopsy cavity. Follow up 2 view mammogram was performed and dictated separately. IMPRESSION: Ultrasound guided biopsy of enlarged LEFT axillary lymph node. No apparent complications. Electronically Signed: By: Nolon Nations M.D. On: 01/14/2018 09:00   Mm Clip Placement Left  Result Date: 01/14/2018 CLINICAL DATA:  Status post biopsy of an enlarged LEFT axillary lymph node in the setting of known malignancy in the LEFT breast. EXAM: DIAGNOSTIC LEFT MAMMOGRAM POST ULTRASOUND BIOPSY COMPARISON:  Previous exam(s). FINDINGS: Mammographic images were obtained following ultrasound guided biopsy of enlarged LEFT axillary lymph node and placement of a HydroMARK spiral shaped clip. Despite numerous attempts, the clip is not visible mammographically due to its depth. Post biopsy changes are identified in the subcutaneous tissues. IMPRESSION: The clip placed at the time of ultrasound-guided core biopsy of enlarged LEFT axilla lymph node is not visible mammographically. Final Assessment: Post Procedure Mammograms for Marker Placement Electronically Signed   By: Nolon Nations M.D.   On: 01/14/2018 09:02    ELIGIBLE FOR AVAILABLE RESEARCH PROTOCOL: Exact signs of study  ASSESSMENT: 52 y.o.  [place], [place] woman status post left breast lower outer quadrant biopsy 12/23/2017 for a clinically multifocal T2 N0, stage II invasive ductal carcinoma, grade 3, essentially estrogen receptor and progesterone receptor negative, but HER-2 amplified, with an MIB-1 of 90%.  (a) breast MRI 01/07/2018 shows the 2.6 cm main mass and 4 additional smaller masses spanning 7.1 cm; there was a suspicious left axillary lymph node  (b) left axillary lymph node biopsy 01/14/2018 was benign/discordant (no lymph node tissue)  (1) neoadjuvant chemotherapy will consist of carboplatin and docetaxel  every 21 days for 6 cycles starting 01/20/2018  (2) anti-HER-2 immunotherapy will consist of trastuzumab and Pertuzumab, to be continued for 6  months, starting concurrently with chemotherapy  (a) baseline echocardiogram 01/08/2018 shows an ejection fraction in the 60-65% range  (3) definitive surgery pending  (4) adjuvant radiation as appropriate  (5) consider antiestrogens given the weak receptor positivity  PLAN:  Alyce is doing well today.  She will proceed with her second cycle of neoadjuvant chemotherapy today (so long as labs are within parameters) with Docetaxel, Carboplatin, Trastuzumab and pertuzumab.  I reviewed her nausea medications with her in detail and how to take them.  She knows to wait three days after chemotherapy to start taking Ondansetron.  I added IV fluids following her next appointment with Dr. Jana Hakim so that she can receive them if she needed to.    We will follow her blood sugar, it is slightly elevated today.  It is ? Related to the dexamethasone.  If it continues to be elevated she may need an intervention.    Serria will return in 1 week for labs, f/u with Dr. Jana Hakim, and possible IV hydration.  She knows to call for any questions or concerns prior to her next treatment with [place].    A total of (30) minutes of face-to-face time was spent with this patient with greater than 50% of that time in counseling and care-coordination.   Wilber Bihari, NP  02/10/18 10:06 AM Medical Oncology and Hematology [facility] [address] [place], [place] [address] Tel. [phone]    Fax. [phone]

## 2018-02-11 ENCOUNTER — Other Ambulatory Visit: Payer: Self-pay | Admitting: *Deleted

## 2018-02-11 DIAGNOSIS — C50512 Malignant neoplasm of lower-outer quadrant of left female breast: Secondary | ICD-10-CM

## 2018-02-11 DIAGNOSIS — Z170 Estrogen receptor positive status [ER+]: Principal | ICD-10-CM

## 2018-02-11 MED ORDER — ONDANSETRON HCL 8 MG PO TABS: 8.0000 mg | ORAL_TABLET | Freq: Three times a day (TID) | ORAL | 3 refills | Status: DC | PRN

## 2018-02-11 NOTE — Progress Notes (Signed)
Pt called informing [facility] does not have zofran prescription. Re-sent to pharmacy. Discussed anti-nausea medications and gave instructions. Asked pt if she felt she needed to come in today to be seen and to receive IVF and anti-nausea medications, pt relate she is going to see how she feels after taking compazine and decadron. Encourage pt to call if symptoms worsen. Received verbal understanding.

## 2018-02-12 ENCOUNTER — Ambulatory Visit: Payer: 59

## 2018-02-15 ENCOUNTER — Observation Stay (HOSPITAL_COMMUNITY)
Admission: EM | Admit: 2018-02-15 | Discharge: 2018-02-17 | Disposition: A | Discharge status: Home / Self Care | Payer: 59 | Attending: Internal Medicine | Admitting: Internal Medicine

## 2018-02-15 ENCOUNTER — Emergency Department (HOSPITAL_COMMUNITY): Payer: 59

## 2018-02-15 ENCOUNTER — Encounter (HOSPITAL_COMMUNITY): Payer: Self-pay

## 2018-02-15 ENCOUNTER — Other Ambulatory Visit: Payer: Self-pay

## 2018-02-15 DIAGNOSIS — E876 Hypokalemia: Secondary | ICD-10-CM | POA: Insufficient documentation

## 2018-02-15 DIAGNOSIS — R197 Diarrhea, unspecified: Secondary | ICD-10-CM | POA: Insufficient documentation

## 2018-02-15 DIAGNOSIS — C50512 Malignant neoplasm of lower-outer quadrant of left female breast: Secondary | ICD-10-CM | POA: Diagnosis not present

## 2018-02-15 DIAGNOSIS — I10 Essential (primary) hypertension: Secondary | ICD-10-CM | POA: Insufficient documentation

## 2018-02-15 DIAGNOSIS — R4020 Unspecified coma: Secondary | ICD-10-CM | POA: Diagnosis not present

## 2018-02-15 DIAGNOSIS — Z170 Estrogen receptor positive status [ER+]: Secondary | ICD-10-CM | POA: Diagnosis not present

## 2018-02-15 DIAGNOSIS — R4189 Other symptoms and signs involving cognitive functions and awareness: Secondary | ICD-10-CM

## 2018-02-15 DIAGNOSIS — Z79899 Other long term (current) drug therapy: Secondary | ICD-10-CM | POA: Insufficient documentation

## 2018-02-15 DIAGNOSIS — R55 Syncope and collapse: Secondary | ICD-10-CM | POA: Diagnosis not present

## 2018-02-15 DIAGNOSIS — E860 Dehydration: Secondary | ICD-10-CM | POA: Insufficient documentation

## 2018-02-15 DIAGNOSIS — I959 Hypotension, unspecified: Secondary | ICD-10-CM | POA: Diagnosis not present

## 2018-02-15 HISTORY — DX: Malignant neoplasm of unspecified site of unspecified female breast: C50.919

## 2018-02-15 LAB — BASIC METABOLIC PANEL
ANION GAP: 10 (ref 5–15)
BUN: 21 mg/dL — ABNORMAL HIGH (ref 6–20)
CHLORIDE: 97 mmol/L — AB (ref 101–111)
CO2: 30 mmol/L (ref 22–32)
Calcium: 8.4 mg/dL — ABNORMAL LOW (ref 8.9–10.3)
Creatinine, Ser: 1.1 mg/dL — ABNORMAL HIGH (ref 0.44–1.00)
GFR calc Af Amer: >60 mL/min (ref >60)
GFR calc non Af Amer: 57 mL/min — ABNORMAL LOW (ref >60)
GLUCOSE: 141 mg/dL — AB (ref 65–99)
POTASSIUM: 3.7 mmol/L (ref 3.5–5.1)
Sodium: 137 mmol/L (ref 135–145)

## 2018-02-15 LAB — URINALYSIS, ROUTINE W REFLEX MICROSCOPIC
BACTERIA UA: NONE SEEN
Bilirubin Urine: NEGATIVE
Glucose, UA: NEGATIVE mg/dL
KETONES UR: NEGATIVE mg/dL
Leukocytes, UA: NEGATIVE
Nitrite: NEGATIVE
PROTEIN: NEGATIVE mg/dL
Specific Gravity, Urine: 1.011 (ref 1.005–1.030)
pH: 7 (ref 5.0–8.0)

## 2018-02-15 LAB — CBC
HEMATOCRIT: 37.4 % (ref 36.0–46.0)
HEMOGLOBIN: 12.4 g/dL (ref 12.0–15.0)
MCH: 31.1 pg (ref 26.0–34.0)
MCHC: 33.2 g/dL (ref 30.0–36.0)
MCV: 93.7 fL (ref 78.0–100.0)
Platelets: 176 10*3/uL (ref 150–400)
RBC: 3.99 MIL/uL (ref 3.87–5.11)
RDW: 14.2 % (ref 11.5–15.5)
WBC: 3.9 10*3/uL — ABNORMAL LOW (ref 4.0–10.5)

## 2018-02-15 LAB — D-DIMER, QUANTITATIVE (NOT AT ARMC): D DIMER QUANT: 1.13 ug{FEU}/mL — AB (ref 0.00–0.50)

## 2018-02-15 LAB — MAGNESIUM: MAGNESIUM: 2.1 mg/dL (ref 1.7–2.4)

## 2018-02-15 LAB — PHOSPHORUS: Phosphorus: 4.6 mg/dL (ref 2.5–4.6)

## 2018-02-15 LAB — TROPONIN I

## 2018-02-15 MED ORDER — ACETAMINOPHEN 325 MG PO TABS: 650.0000 mg | ORAL_TABLET | Freq: Four times a day (QID) | ORAL | Status: DC | PRN

## 2018-02-15 MED ORDER — ACETAMINOPHEN 650 MG RE SUPP: 650.0000 mg | Freq: Four times a day (QID) | RECTAL | Status: DC | PRN

## 2018-02-15 MED ORDER — ONDANSETRON HCL 4 MG PO TABS: 8.0000 mg | ORAL_TABLET | Freq: Three times a day (TID) | ORAL | Status: DC | PRN

## 2018-02-15 MED ORDER — LOPERAMIDE HCL 2 MG PO CAPS: 4.0000 mg | ORAL_CAPSULE | ORAL | Status: DC | PRN

## 2018-02-15 MED ORDER — SODIUM CHLORIDE 0.9% FLUSH: 10.0000 mL | INTRAVENOUS | Status: DC | PRN

## 2018-02-15 MED ORDER — ENOXAPARIN SODIUM 40 MG/0.4ML ~~LOC~~ SOLN
40.0000 mg | Freq: Every day | SUBCUTANEOUS | Status: DC
  Administered 2018-02-15 – 2018-02-16 (×2): 40 mg via SUBCUTANEOUS
  Filled 2018-02-15 (×2): qty 0.4

## 2018-02-15 MED ORDER — LORAZEPAM 0.5 MG PO TABS: 0.5000 mg | ORAL_TABLET | Freq: Every evening | ORAL | Status: DC | PRN

## 2018-02-15 MED ORDER — POTASSIUM CHLORIDE CRYS ER 20 MEQ PO TBCR
40.0000 meq | EXTENDED_RELEASE_TABLET | Freq: Every day | ORAL | Status: DC
  Administered 2018-02-16 – 2018-02-17 (×2): 40 meq via ORAL
  Filled 2018-02-15 (×2): qty 2

## 2018-02-15 MED ORDER — SODIUM CHLORIDE 0.9 % IV SOLN
INTRAVENOUS | Status: AC
  Administered 2018-02-15: 75 mL/h via INTRAVENOUS

## 2018-02-15 MED ORDER — LOSARTAN POTASSIUM 50 MG PO TABS
50.0000 mg | ORAL_TABLET | Freq: Every day | ORAL | Status: DC
  Administered 2018-02-16 – 2018-02-17 (×2): 50 mg via ORAL
  Filled 2018-02-15 (×2): qty 1

## 2018-02-15 MED ORDER — SODIUM CHLORIDE 0.9 % IV SOLN: INTRAVENOUS | Status: DC

## 2018-02-15 NOTE — ED Notes (Signed)
Bed: OI51 Expected date:  Expected time:  Means of arrival:  Comments: 52 yo syncope

## 2018-02-15 NOTE — ED Triage Notes (Signed)
She reports being at a graduation ceremony at the [employer] today, at which time she "felt weird and almost passed out". She arrives here alert and oriented x 4 with clear speech. She denies any pain or discomfort and tells [place] that her most recent chemo. For breast cancer was this Monday. Monitor shows nsr--rate ~75.

## 2018-02-15 NOTE — ED Notes (Signed)
She is visiting pleasantly with her female visitor. She remains in no distress.

## 2018-02-15 NOTE — ED Notes (Signed)
ED TO INPATIENT HANDOFF REPORT  Name/Age/Gender Lindsey Wright 52 y.o. female  Code Status   Home/SNF/Other Home  Chief Complaint near syncope  Level of Care/Admitting Diagnosis ED Disposition    ED Disposition Condition [place]: [facility] [[id number]]  Level of Care: Telemetry [5]  Admit to tele based on following criteria: Monitor for Ischemic changes  Diagnosis: Syncope [206001]  Admitting Physician: Jani Gravel [[id number]]  Attending Physician: Jani Gravel [[id number]]  PT Class (Do Not Modify): Observation [104]  PT Acc Code (Do Not Modify): Observation [10022]       Medical History Past Medical History:  Diagnosis Date  . Anemia    iron deficient anemia  . History of Bell's palsy     x 2 as teenager and in 20's  . Hypertension   . Menorrhagia   . Overweight(278.02)     Allergies Allergies  Allergen Reactions  . Ace Inhibitors Cough    IV Location/Drains/Wounds Patient Lines/Drains/Airways Status   Active Line/Drains/Airways    Name:   Placement date:   Placement time:   Site:   Days:   Implanted Port 01/17/18 Right Chest   01/17/18    0752    Chest   29   Peripheral IV 02/15/18 Left Antecubital   02/15/18    1645    Antecubital   less than 1   Incision (Closed) 01/17/18 Chest Right   01/17/18    0754     29          Labs/Imaging Results for orders placed or performed during the hospital encounter of 02/15/18 (from the past 48 hour(s))  Basic metabolic panel     Status: Abnormal   Collection Time: 02/15/18  6:06 PM  Result Value Ref Range   Sodium 137 135 - 145 mmol/L   Potassium 3.7 3.5 - 5.1 mmol/L   Chloride 97 (L) 101 - 111 mmol/L   CO2 30 22 - 32 mmol/L   Glucose, Bld 141 (H) 65 - 99 mg/dL   BUN 21 (H) 6 - 20 mg/dL   Creatinine, Ser 1.10 (H) 0.44 - 1.00 mg/dL   Calcium 8.4 (L) 8.9 - 10.3 mg/dL   GFR calc non Af Amer 57 (L) >60 mL/min   GFR calc Af Amer >60 >60 mL/min    Comment: (NOTE) The eGFR  has been calculated using the CKD EPI equation. This calculation has not been validated in all clinical situations. eGFR's persistently <60 mL/min signify possible Chronic Kidney Disease.    Anion gap 10 5 - 15    Comment: Performed at [facility], [place] [address] [place], [place] [address]  CBC     Status: Abnormal   Collection Time: 02/15/18  6:06 PM  Result Value Ref Range   WBC 3.9 (L) 4.0 - 10.5 K/uL   RBC 3.99 3.87 - 5.11 MIL/uL   Hemoglobin 12.4 12.0 - 15.0 g/dL   HCT 37.4 36.0 - 46.0 %   MCV 93.7 78.0 - 100.0 fL   MCH 31.1 26.0 - 34.0 pg   MCHC 33.2 30.0 - 36.0 g/dL   RDW 14.2 11.5 - 15.5 %   Platelets 176 150 - 400 K/uL    Comment: Performed at [facility], [place] [address] [place], [place] [address]  Magnesium     Status: None   Collection Time: 02/15/18  6:06 PM  Result Value Ref Range   Magnesium 2.1 1.7 - 2.4  mg/dL    Comment: Performed at [facility], [place] [address], [place], [place] [address]  Phosphorus     Status: None   Collection Time: 02/15/18  6:06 PM  Result Value Ref Range   Phosphorus 4.6 2.5 - 4.6 mg/dL    Comment: Performed at [facility], [place] [address], [place], [place] [address]  Urinalysis, Routine w reflex microscopic     Status: Abnormal   Collection Time: 02/15/18  8:14 PM  Result Value Ref Range   Color, Urine YELLOW YELLOW   APPearance CLEAR CLEAR   Specific Gravity, Urine 1.011 1.005 - 1.030   pH 7.0 5.0 - 8.0   Glucose, UA NEGATIVE NEGATIVE mg/dL   Hgb urine dipstick SMALL (A) NEGATIVE   Bilirubin Urine NEGATIVE NEGATIVE   Ketones, ur NEGATIVE NEGATIVE mg/dL   Protein, ur NEGATIVE NEGATIVE mg/dL   Nitrite NEGATIVE NEGATIVE   Leukocytes, UA NEGATIVE NEGATIVE   RBC / HPF 0-5 0 - 5 RBC/hpf   WBC, UA 0-5 0 - 5 WBC/hpf   Bacteria, UA NONE SEEN NONE SEEN   Mucus PRESENT    Hyaline Casts, UA PRESENT     Comment: Performed at [facility], [place] [address], [place], [place] [address]   Ct Head Wo Contrast  Result Date: 02/15/2018 CLINICAL DATA:  Near syncope today.  History of breast cancer EXAM: CT HEAD WITHOUT CONTRAST TECHNIQUE: Contiguous axial images were obtained from the base of the skull through the vertex without intravenous contrast. COMPARISON:  MR brain December 26, 2005 FINDINGS: Brain: There is question low-density lesion in the left frontal lobe image 25. There is no midline shift, hydrocephalus, acute hemorrhage or acute transcortical infarct. Vascular: No hyperdense vessel or unexpected calcification. Skull: Normal. Negative for fracture or focal lesion. Sinuses/Orbits: No acute finding. Other: None. IMPRESSION: Question low-density lesion in the left frontal lobe. Further evaluation with MRI of the brain with and without contrast is recommended. Electronically Signed   By: Abelardo Diesel M.D.   On: 02/15/2018 18:53    Pending Labs Unresulted Labs (From admission, onward)   Start     Ordered   02/15/18 2051  Troponin I (q 6hr x 3)  Now then every 6 hours,   R     02/15/18 2050   02/15/18 2051  D-dimer, quantitative (not at [facility])  Add-on,   R     02/15/18 2050      Vitals/Pain Today's Vitals   02/15/18 1711 02/15/18 1717 02/15/18 1933 02/15/18 2107  BP: 121/71  121/66 126/80  Pulse: 73  90 88  Resp: (!) _0 Temp: 98.3 F (36.8 C)     TempSrc: Oral     SpO2: 100%  99% 95%  Weight:   185 lb (83.9 kg)   Height:   5' 6.5" (1.689 m)   PainSc:  0-No pain 0-No pain     Isolation Precautions No active isolations  Medications Medications  0.9 %  sodium chloride infusion (has no administration in time range)    Mobility walks

## 2018-02-15 NOTE — H&P (Signed)
TRH H&P   Patient Demographics:    Lindsey Wright, is a 52 y.o. female  MRN: [id number]   DOB - [birth year]  Admit Date - 02/15/2018  Outpatient Primary MD for the patient is Tower, Wynelle Fanny, MD  Referring MD/NP/PA:  Marla Roe  Outpatient Specialists:  Lurline Del  Patient coming from: [facility]  Chief Complaint  Patient presents with  . Near Syncope      HPI:    Lindsey Wright  is a 52 y.o. female, w breast cancer , hypertension, apparently presents with c/o syncope while sitting at the [facility] [facility] watching her daughter graduate. Pt was out for about 5 minutes per her family.  Pt denies any postictal symptoms.  Pt denies presyncopal symptoms.  Pt denies headache, cp, palp, sob, n/v, diarrhea, brbpr,   In Ed,  CT brain IMPRESSION: Question low-density lesion in the left frontal lobe. Further evaluation with MRI of the brain with and without contrast is recommended.  Na 137, K 3.7, Glucose 141 Bun 21, Creatinine 1.10  Wbc 3.9, Hgb 12.4, Plt 176  Magnesium 2.1  Urinalysis negative  Pt will be admitted for syncope and also ? Low density lesion in the left frontal lobe.      Review of systems:    In addition to the HPI above,  No Fever-chills, No Headache, No changes with Vision or hearing, No problems swallowing food or Liquids, No Chest pain, Cough or Shortness of Breath, No Abdominal pain, No Nausea or Vommitting, Bowel movements are regular, No Blood in stool or Urine, No dysuria, No new skin rashes or bruises, No new joints pains-aches,  No new weakness, tingling, numbness in any extremity, No recent weight gain or loss, No polyuria, polydypsia or polyphagia, No significant Mental Stressors.  A full 10 point Review of Systems was done, except as stated above, all other Review of Systems were negative.   With Past History of the following  :    Past Medical History:  Diagnosis Date  . Anemia    iron deficient anemia  . History of Bell's palsy     x 2 as teenager and in 20's  . Hypertension   . Menorrhagia   . Overweight(278.02)       Past Surgical History:  Procedure Laterality Date  . BREAST BIOPSY Right 02/01/2015   benign  . CESAREAN SECTION CLASSICAL    . CHOLECYSTECTOMY    . COSMETIC SURGERY    . PORTACATH PLACEMENT Right 01/17/2018   Procedure: INSERTION PORT-A-CATH;  Surgeon: [name], MD;  Location: [facility];  Service: General;  Laterality: Right;  . TUBAL LIGATION        Social History:     Social History   Tobacco Use  . Smoking status: Never Smoker  . Smokeless tobacco: Never Used  Substance Use Topics  . Alcohol use: No  Alcohol/week: 0.0 oz     Lives -  At home Mobility -  Walks by self   Family History :     Family History  Problem Relation Age of Onset  . Hypertension Mother   . Diabetes Mother   . Stroke Mother   . Alcohol abuse Father   . Cancer Father        lung ca  . Hypertension Father   . Hypertension Sister   . Colon cancer Neg Hx   . Colon polyps Neg Hx   . Rectal cancer Neg Hx   . Stomach cancer Neg Hx        Home Medications:   Prior to Admission medications   Medication Sig Start Date End Date Taking? Authorizing Provider  dexamethasone (DECADRON) 4 MG tablet Take 2 tablets (8 mg total) by mouth 2 (two) times daily. Start the day before Taxotere. Take once the day after, then 2 times a day x 2d. 01/15/18  Yes Magrinat, [name], MD  lidocaine-prilocaine (EMLA) cream Apply to affected area once 01/15/18  Yes Magrinat, [name], MD  loperamide (LOPERAMIDE A-D) 2 MG tablet Take 4 mg by mouth as needed for diarrhea or loose stools.   Yes [provider]  LORazepam (ATIVAN) 0.5 MG tablet Take 1 tablet (0.5 mg total) by mouth at bedtime as needed (Nausea or vomiting). 01/15/18  Yes Magrinat, [name], MD  losartan (COZAAR) 50 MG  tablet TAKE 1 TABLET (50 MG TOTAL) BY MOUTH DAILY. 11/11/17  Yes Tower, Wynelle Fanny, MD  ondansetron (ZOFRAN) 8 MG tablet Take 1 tablet (8 mg total) by mouth every 8 (eight) hours as needed for nausea or vomiting. 02/11/18  Yes Causey, Charlestine Massed, NP  potassium chloride SA (KLOR-CON M20) 20 MEQ tablet Take 2 tablets (40 mEq total) by mouth daily. 11/11/17  Yes Tower, Wynelle Fanny, MD  prochlorperazine (COMPAZINE) 10 MG tablet Take 1 tablet (10 mg total) by mouth every 6 (six) hours as needed (Nausea or vomiting). 01/15/18  Yes Magrinat, [name], MD  triamterene-hydrochlorothiazide (MAXZIDE) 75-50 MG tablet TAKE 1/2 TABLETS BY MOUTH DAILY. 11/11/17  Yes Tower, Wynelle Fanny, MD     Allergies:     Allergies  Allergen Reactions  . Ace Inhibitors Cough     Physical Exam:   Vitals  Blood pressure 126/80, pulse 88, temperature 98.3 F (36.8 C), temperature source Oral, resp. rate 20, height 5' 6.5" (1.689 m), weight 83.9 kg (185 lb), SpO2 95 %.   1. General  lying in bed in NAD,    2. Normal affect and insight, Not Suicidal or Homicidal, Awake Alert, Oriented X 3.  3. No F.N deficits, ALL C.Nerves Intact, Strength 5/5 all 4 extremities, Sensation intact all 4 extremities, Plantars down going.  4. Ears and Eyes appear Normal, Conjunctivae clear, PERRLA. Moist Oral Mucosa.  5. Supple Neck, No JVD, No cervical lymphadenopathy appriciated, No Carotid Bruits.  6. Symmetrical Chest wall movement, Good air movement bilaterally, CTAB.  7. RRR, No Gallops, Rubs or Murmurs, No Parasternal Heave.  8. Positive Bowel Sounds, Abdomen Soft, No tenderness, No organomegaly appriciated,No rebound -guarding or rigidity.  9.  No Cyanosis, Normal Skin Turgor, No Skin Rash or Bruise.  10. Good muscle tone,  joints appear normal , no effusions, Normal ROM.  11. No Palpable Lymph Nodes in Neck or Axillae      Data Review:    CBC Recent Labs  Lab 02/10/18 0941 02/15/18 1806  WBC 24.7* 3.9*  HGB 12.2 12.4  HCT  37.2 37.4  PLT 309 176  MCV 93.7 93.7  MCH 30.7 31.1  MCHC 32.8 33.2  RDW 14.2 14.2  LYMPHSABS 1.3  --   MONOABS 0.8  --   EOSABS 0.0  --   BASOSABS 0.0  --    ------------------------------------------------------------------------------------------------------------------  Chemistries  Recent Labs  Lab 02/10/18 0941 02/15/18 1806  NA 136 137  K 3.9 3.7  CL 104 97*  CO2 23 30  GLUCOSE 252* 141*  BUN 13 21*  CREATININE 0.97 1.10*  CALCIUM 9.4 8.4*  MG  --  2.1  AST 26  --   ALT 34  --   ALKPHOS 113  --   BILITOT 0.3  --    ------------------------------------------------------------------------------------------------------------------ estimated creatinine clearance is 66.8 mL/min (A) (by C-G formula based on SCr of 1.1 mg/dL (H)). ------------------------------------------------------------------------------------------------------------------ No results for input(s): TSH, T4TOTAL, T3FREE, THYROIDAB in the last 72 hours.  Invalid input(s): FREET3  Coagulation profile No results for input(s): INR, PROTIME in the last 168 hours. ------------------------------------------------------------------------------------------------------------------- No results for input(s): DDIMER in the last 72 hours. -------------------------------------------------------------------------------------------------------------------  Cardiac Enzymes No results for input(s): CKMB, TROPONINI, MYOGLOBIN in the last 168 hours.  Invalid input(s): CK ------------------------------------------------------------------------------------------------------------------ No results found for: BNP   ---------------------------------------------------------------------------------------------------------------  Urinalysis    Component Value Date/Time   COLORURINE YELLOW 02/15/2018 2014   APPEARANCEUR CLEAR 02/15/2018 2014   LABSPEC 1.011 02/15/2018 2014   PHURINE 7.0 02/15/2018 2014   GLUCOSEU  NEGATIVE 02/15/2018 2014   HGBUR SMALL (A) 02/15/2018 2014   [place] NEGATIVE 02/15/2018 2014   [place] NEGATIVE 02/15/2018 2014   PROTEINUR NEGATIVE 02/15/2018 2014   NITRITE NEGATIVE 02/15/2018 2014   LEUKOCYTESUR NEGATIVE 02/15/2018 2014    ----------------------------------------------------------------------------------------------------------------   Imaging Results:    Ct Head Wo Contrast  Result Date: 02/15/2018 CLINICAL DATA:  Near syncope today.  History of breast cancer EXAM: CT HEAD WITHOUT CONTRAST TECHNIQUE: Contiguous axial images were obtained from the base of the skull through the vertex without intravenous contrast. COMPARISON:  MR brain December 26, 2005 FINDINGS: Brain: There is question low-density lesion in the left frontal lobe image 25. There is no midline shift, hydrocephalus, acute hemorrhage or acute transcortical infarct. Vascular: No hyperdense vessel or unexpected calcification. Skull: Normal. Negative for fracture or focal lesion. Sinuses/Orbits: No acute finding. Other: None. IMPRESSION: Question low-density lesion in the left frontal lobe. Further evaluation with MRI of the brain with and without contrast is recommended. Electronically Signed   By: Abelardo Diesel M.D.   On: 02/15/2018 18:53      Assessment & Plan:    Active Problems:   Syncope    Syncope Tele Trop I q6h x3 D dimer, if positive then CTA  Chest r/o PE Carotid ultrasound  Cardiac echo 01/08/2018, EF wnl EEG  ? Lesion of the left frontal lobe MRI brain with and without contrast  Hypertension bp was soft HOLD Maxzide Cont Losartan 50mg  po qday  Hypokalemia Cont potassium   DVT Prophylaxis   Lovenox - SCDs   AM Labs Ordered, also please review Full Orders  Family Communication: Admission, patients condition and plan of care including tests being ordered have been discussed with the patient  who indicate understanding and agree with the plan and Code Status.  Code Status  FULL CODE  Likely DC to  home  Condition GUARDED    Consults called: none  Admission status: observation   Time spent in minutes : [place]  Saira Kramme M.D on 02/15/2018 at 9:41 PM  Between 7am to 7pm - Pager - [phone] . After 7pm go to [email or web address] - password [facility]  [facility] - Office  [phone]

## 2018-02-15 NOTE — ED Provider Notes (Signed)
[facility] DEPT Provider Note   CSN: [id number] Arrival date & time: 02/15/18  1656     History   Chief Complaint Chief Complaint  Patient presents with  . Near Syncope    HPI Lindsey Wright is a 52 y.o. female.  HPI 52 year old female with history of breast cancer on chemotherapy comes in with chief complaint of unresponsive spell.  Patient states that she was at her daughter's graduation, and suddenly started feeling unwell.  Patient's best friend who is at the bedside reports that patient started complaining of not feeling well and then she had an episode of blank stare that lasted for about 10 minutes.  Patient was diaphoretic prior to the episode.  Thereafter, patient became responsive for a few minutes followed by another episode of blank stare that lasted about 2 to 5 minutes.  Before EMS went to assess the patient, they did give her some food thinking that patient might be hypoglycemic.  Patient was not confused after the episode.  There was no generalized tonic-clonic shaking, no incontinence and there is no history of similar symptoms in the past.  Patient denies any headaches, neck pain, fevers, chills.  She does report not eating well since her chemo last week.   Past Medical History:  Diagnosis Date  . Anemia    iron deficient anemia  . History of Bell's palsy     x 2 as teenager and in 20's  . Hypertension   . Menorrhagia   . Overweight(278.02)     Patient Active Problem List   Diagnosis Date Noted  . Syncope 02/15/2018  . Port-A-Cath in place 01/20/2018  . Malignant neoplasm of lower-outer quadrant of 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, estrogen receptor positive ([place]) 12/26/2017  . Colon cancer screening 01/29/2017  . Overweight (BMI 25.0-29.9) 10/29/2016  . Hyperglycemia 09/25/2013  . Essential hypertension 08/15/2007    Past Surgical History:  Procedure Laterality Date  . BREAST BIOPSY Right 02/01/2015   benign  .  CESAREAN SECTION CLASSICAL    . CHOLECYSTECTOMY    . COSMETIC SURGERY    . PORTACATH PLACEMENT Right 01/17/2018   Procedure: INSERTION PORT-A-CATH;  Surgeon: [name], MD;  Location: [facility];  Service: General;  Laterality: Right;  . TUBAL LIGATION       OB History   None      Home Medications    Prior to Admission medications   Medication Sig Start Date End Date Taking? Authorizing Provider  dexamethasone (DECADRON) 4 MG tablet Take 2 tablets (8 mg total) by mouth 2 (two) times daily. Start the day before Taxotere. Take once the day after, then 2 times a day x 2d. 01/15/18  Yes Magrinat, [name], MD  lidocaine-prilocaine (EMLA) cream Apply to affected area once 01/15/18  Yes Magrinat, [name], MD  loperamide (LOPERAMIDE A-D) 2 MG tablet Take 4 mg by mouth as needed for diarrhea or loose stools.   Yes [provider]  LORazepam (ATIVAN) 0.5 MG tablet Take 1 tablet (0.5 mg total) by mouth at bedtime as needed (Nausea or vomiting). 01/15/18  Yes Magrinat, [name], MD  losartan (COZAAR) 50 MG tablet TAKE 1 TABLET (50 MG TOTAL) BY MOUTH DAILY. 11/11/17  Yes Tower, Wynelle Fanny, MD  ondansetron (ZOFRAN) 8 MG tablet Take 1 tablet (8 mg total) by mouth every 8 (eight) hours as needed for nausea or vomiting. 02/11/18  Yes Causey, Charlestine Massed, NP  potassium chloride SA (KLOR-CON M20) 20  MEQ tablet Take 2 tablets (40 mEq total) by mouth daily. 11/11/17  Yes Tower, Wynelle Fanny, MD  prochlorperazine (COMPAZINE) 10 MG tablet Take 1 tablet (10 mg total) by mouth every 6 (six) hours as needed (Nausea or vomiting). 01/15/18  Yes Magrinat, [name], MD  triamterene-hydrochlorothiazide (MAXZIDE) 75-50 MG tablet TAKE 1/2 TABLETS BY MOUTH DAILY. 11/11/17  Yes Tower, Wynelle Fanny, MD    Family History Family History  Problem Relation Age of Onset  . Hypertension Mother   . Diabetes Mother   . Stroke Mother   . Alcohol abuse Father   . Cancer Father        lung ca  . Hypertension Father     . Hypertension Sister   . Colon cancer Neg Hx   . Colon polyps Neg Hx   . Rectal cancer Neg Hx   . Stomach cancer Neg Hx     Social History Social History   Tobacco Use  . Smoking status: Never Smoker  . Smokeless tobacco: Never Used  Substance Use Topics  . Alcohol use: No    Alcohol/week: 0.0 oz  . Drug use: No     Allergies   Ace inhibitors   Review of Systems Review of Systems  Constitutional: Positive for activity change and diaphoresis.  Respiratory: Negative for chest tightness and shortness of breath.   Cardiovascular: Negative for chest pain and leg swelling.  Gastrointestinal: Positive for diarrhea. Negative for nausea and vomiting.  Allergic/Immunologic: Positive for immunocompromised state.  Hematological: Does not bruise/bleed easily.  All other systems reviewed and are negative.    Physical Exam Updated Vital Signs BP 121/66 (BP Location: Right Arm)   Pulse 90   Temp 98.3 F (36.8 C) (Oral)   Resp 20   Ht 5' 6.5" (1.689 m)   Wt 83.9 kg (185 lb)   SpO2 99%   BMI 29.41 kg/m   Physical Exam  Constitutional: She is oriented to person, place, and time. She appears well-developed.  HENT:  Head: Normocephalic and atraumatic.  Eyes: Pupils are equal, round, and reactive to light. EOM are normal.  Neck: Normal range of motion. Neck supple.  Cardiovascular: Normal rate.  Pulmonary/Chest: Effort normal.  Abdominal: Soft. Bowel sounds are normal.  Musculoskeletal: She exhibits no edema or tenderness.  Neurological: She is alert and oriented to person, place, and time.  Skin: Skin is warm and dry.  Nursing note and vitals reviewed.    ED Treatments / Results  Labs (all labs ordered are listed, but only abnormal results are displayed) Labs Reviewed  BASIC METABOLIC PANEL - Abnormal; Notable for the following components:      Result Value   Chloride 97 (*)    Glucose, Bld 141 (*)    BUN 21 (*)    Creatinine, Ser 1.10 (*)    Calcium 8.4 (*)     GFR calc non Af Amer 57 (*)    All other components within normal limits  CBC - Abnormal; Notable for the following components:   WBC 3.9 (*)    All other components within normal limits  URINALYSIS, ROUTINE W REFLEX MICROSCOPIC - Abnormal; Notable for the following components:   Hgb urine dipstick SMALL (*)    All other components within normal limits  MAGNESIUM  PHOSPHORUS  TROPONIN I  TROPONIN I  TROPONIN I  D-DIMER, QUANTITATIVE (NOT AT [facility])    EKG None  Radiology Ct Head Wo Contrast  Result Date: 02/15/2018 CLINICAL DATA:  Near syncope  today.  History of breast cancer EXAM: CT HEAD WITHOUT CONTRAST TECHNIQUE: Contiguous axial images were obtained from the base of the skull through the vertex without intravenous contrast. COMPARISON:  MR brain December 26, 2005 FINDINGS: Brain: There is question low-density lesion in the left frontal lobe image 25. There is no midline shift, hydrocephalus, acute hemorrhage or acute transcortical infarct. Vascular: No hyperdense vessel or unexpected calcification. Skull: Normal. Negative for fracture or focal lesion. Sinuses/Orbits: No acute finding. Other: None. IMPRESSION: Question low-density lesion in the left frontal lobe. Further evaluation with MRI of the brain with and without contrast is recommended. Electronically Signed   By: Abelardo Diesel M.D.   On: 02/15/2018 18:53    Procedures Procedures (including critical care time)  Medications Ordered in ED Medications  0.9 %  sodium chloride infusion (has no administration in time range)     Initial Impression / Assessment and Plan / ED Course  I have reviewed the triage vital signs and the nursing notes.  Pertinent labs & imaging results that were available during my care of the patient were reviewed by me and considered in my medical decision making (see chart for details).     52 year old comes in with an unresponsive episode. It seems like patient had 2 separate episode of blank  staring.  She has history of breast cancer, no brain mets.  She does not have any focal neurologic complains.  Clinical concerns are for an absence seizure. Doubt psychogenic component at this time.  Also this does not appear to be an incident of syncope.  Patient has breast cancer history, which has high rates of brain mets - so CT head ordered.  Tele neurology has been paged.  Final Clinical Impressions(s) / ED Diagnoses   Final diagnoses:  Unresponsive episode    ED Discharge Orders    None       Varney Biles, MD 02/15/18 2107

## 2018-02-15 NOTE — ED Notes (Signed)
Consult TeleNeurology @19 :31pm.

## 2018-02-15 NOTE — Consult Note (Addendum)
[facility] Consult Services  Impression:  1)  Syncope, likely vasovagal.  She was dehydrated today after diarhea, strated with feeling warm/clammy, then lost consciousness, no convulsive activity, resolved when she was laid flat, no post-event disorientation or confusion, "very low" BP reported by EMS.  I don't think that this was a seizure.  Also no stroke like symptoms.  NIHSS 0. 2)  Abnormal Head CT.  There is an area of hypodensity in the left superior frontal cortex.  Does not look typical of ischemia.  I believe that this is likely incidental to presentation, nevertheless, is concerning, and chief concern would be for metastatic disease given breast cancer.  Recommendations:  =>  IVF. =>  Brain MRI w/wo.  Discussed with ED physician.  Verdis Prime, MD [facility]  ---------------------------------------------------------------------  History: 52 yo woman with recent diagnosis of breast cancer s/p neoadjuvant chemotherapy (just completed cycle 2/6) with plan for upcoming surgery, HTN, who was brought to ED today.  She reports that she had diarrhea today and oral intake was poor.  She then reports that she was at her daughter's graduation when she started to feel hot/warmth, then ears started to pop, then she felt "weird", then she became diaphoretic and clammy, then she developed staring unreponsiveness without loss of posture, without adventitious movements or twitching or jerking, which lasted few minutes.  Then bystanders laid her down and then she recovered consciousness slowly.  Then they tried to sit her up and the same thing happened where she became limp and unresponsive, she felt like she could hear but not see, so they laid her back down, then she recovered consciousness again.  There was no post-event disorientation or confusion.  Paramedics reported that BP was very low.  This sounds like vagal syncope and not seizure.  There was no convulsive activity.   There were no focal neurological deficits or stroke-like symptoms.    Diagnostic Testing: Labs:   BUN 21, Cr 1.10, otherwise labs unremarkable.   Imaging: Head CT:  IMPRESSION: Question low-density lesion in the left frontal lobe. Further evaluation with MRI of the brain with and without contrast is recommended.  Vital Signs:   BP 121/71 I don't have documentation of how low BP was for EMS.  Exam:   NIHSS 0  Mental Status:  Alert, oriented x 4. President Trump.  Fluent speech, intact naming/repetition/comprehension, no confusion.  Cranial Nerves:  Pupils: Equal round and reactive to light Extraocular movements: Intact in all cardinal gaze No visual field cut Facial sensation: Intact to light touch Facial movements: Intact and symmetric   Tongue midline No dysarthria  Motor Exam:  No drift x 4.  Fine finger movements normal and equal.  Tremor/Abnormal Movements:  No tremors, myoclonus.  Sensory Exam:   Intact to touch in all extremities.  Coordination:   No ataxia to finger/nose or heel/shin.  Medical Decision Making:  - Extensive number of diagnosis or management options are considered above.   - Extensive amount of complex data reviewed.   - High risk of complication and/or morbidity or mortality are associated with differential diagnostic considerations above.  - There may be uncertain outcome and increased probability of prolonged functional impairment or high probability of severe prolonged functional impairment associated with some of these differential diagnosis.   Medical Data Reviewed:  1.Data reviewed include clinical labs, radiology,  Medical Tests;   2.Tests results discussed w/performing or interpreting physician;   3.Obtaining/reviewing old medical records;  4.Obtaining case history from another source;  5.Independent review of image, tracing or specimen.   Patient was informed the Neurology Consult would happen via telehealth (remote video) and  consented to receiving care in this manner.

## 2018-02-16 ENCOUNTER — Observation Stay (HOSPITAL_COMMUNITY): Payer: 59

## 2018-02-16 ENCOUNTER — Other Ambulatory Visit: Payer: Self-pay

## 2018-02-16 DIAGNOSIS — R4189 Other symptoms and signs involving cognitive functions and awareness: Secondary | ICD-10-CM | POA: Diagnosis not present

## 2018-02-16 DIAGNOSIS — R7989 Other specified abnormal findings of blood chemistry: Secondary | ICD-10-CM | POA: Diagnosis not present

## 2018-02-16 DIAGNOSIS — R55 Syncope and collapse: Secondary | ICD-10-CM | POA: Diagnosis not present

## 2018-02-16 DIAGNOSIS — R42 Dizziness and giddiness: Secondary | ICD-10-CM | POA: Diagnosis not present

## 2018-02-16 LAB — COMPREHENSIVE METABOLIC PANEL
ALBUMIN: 3.4 g/dL — AB (ref 3.5–5.0)
ALT: 29 U/L (ref 14–54)
ANION GAP: 7 (ref 5–15)
AST: 20 U/L (ref 15–41)
Alkaline Phosphatase: 67 U/L (ref 38–126)
BUN: 18 mg/dL (ref 6–20)
CHLORIDE: 98 mmol/L — AB (ref 101–111)
CO2: 31 mmol/L (ref 22–32)
Calcium: 8.2 mg/dL — ABNORMAL LOW (ref 8.9–10.3)
Creatinine, Ser: 0.83 mg/dL (ref 0.44–1.00)
GFR calc Af Amer: >60 mL/min (ref >60)
GFR calc non Af Amer: >60 mL/min (ref >60)
Glucose, Bld: 142 mg/dL — ABNORMAL HIGH (ref 65–99)
POTASSIUM: 3.3 mmol/L — AB (ref 3.5–5.1)
Sodium: 136 mmol/L (ref 135–145)
Total Bilirubin: 0.9 mg/dL (ref 0.3–1.2)
Total Protein: 6.2 g/dL — ABNORMAL LOW (ref 6.5–8.1)

## 2018-02-16 LAB — CBC
HEMATOCRIT: 34.2 % — AB (ref 36.0–46.0)
HEMOGLOBIN: 11.5 g/dL — AB (ref 12.0–15.0)
MCH: 31.1 pg (ref 26.0–34.0)
MCHC: 33.6 g/dL (ref 30.0–36.0)
MCV: 92.4 fL (ref 78.0–100.0)
Platelets: 191 10*3/uL (ref 150–400)
RBC: 3.7 MIL/uL — ABNORMAL LOW (ref 3.87–5.11)
RDW: 14.3 % (ref 11.5–15.5)
WBC: 2.5 10*3/uL — AB (ref 4.0–10.5)

## 2018-02-16 LAB — HIV ANTIBODY (ROUTINE TESTING W REFLEX): HIV SCREEN 4TH GENERATION: NONREACTIVE

## 2018-02-16 LAB — TROPONIN I: Troponin I: <0.03 ng/mL (ref <0.03)

## 2018-02-16 MED ORDER — GADOBENATE DIMEGLUMINE 529 MG/ML IV SOLN
20.0000 mL | Freq: Once | INTRAVENOUS | Status: AC | PRN
  Administered 2018-02-16: 18 mL via INTRAVENOUS

## 2018-02-16 MED ORDER — IOPAMIDOL (ISOVUE-370) INJECTION 76%
100.0000 mL | Freq: Once | INTRAVENOUS | Status: AC | PRN
  Administered 2018-02-16: 100 mL via INTRAVENOUS

## 2018-02-16 MED ORDER — IOPAMIDOL (ISOVUE-370) INJECTION 76%
INTRAVENOUS | Status: AC
  Filled 2018-02-16: qty 100

## 2018-02-16 NOTE — Progress Notes (Signed)
[facility] Progress Note  Patient: Lindsey Wright TCC:[id number]   PCP: [name], MD DOB: January 05, 1966   DOA: 02/15/2018   DOS: 02/16/2018   Date of Service: the patient was seen and examined on 02/16/2018  Subjective: Feeling better, no further episodes of passing out event. Patient reports that she actually had 2 loose watery bowel movement prior to the event, took one Imodium at 1 PM, 1 Compazine at 2 PM in the event happened around 4 PM. She was also mentioning that she had not had been eating good that day and drinking enough fluid that day as well. Currently no acute complaints.  Brief hospital course: Pt. with PMH of HER-2 positive breast cancer, HTN, overweight, menorrhagia; admitted on 02/15/2018, presented with complaint of unresponsive event, was found to have likely vasovagal syncope. Currently further plan is further work-up for the syncope.  Assessment and Plan: 1.  Syncope. Etiology is likely vasovagal with dehydration as a primary etiology due to diarrhea. Diarrhea currently resolved. Orthostatics are negative. Patient does not have any further episodes of dizziness or lightheadedness. Neuro exam unremarkable. CT head shows an area of hypodensity, MRI brain with and without contrast is currently pending. Telemetry neurology was consulted, felt that the patient does not have any other explanation of her syncope other than vasovagal event.  EEG ordered currently pending. PT OT consult pending. Carotid Doppler pending. Recent echocardiogram last month was showing preserved EF and no valvular abnormality.  2.  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 On chemotherapy as well as radiation. Last infusion was last week. Outpatient follow-up. MRI brain with and without contrast to further work-up of left brain lesion.  3.  Essential hypertension. Patient was on Maxide and losartan, likely both playing a role in patient's presentation with passing out event with ongoing  diarrhea. Currently Maxide is on hold, losartan is continued due to high blood pressure.  4.  Hypokalemia. Likely from GI loss. Monitor.   Body mass index is 29.09 kg/m.   Diet: regular diet DVT Prophylaxis: subcutaneous Heparin  Advance goals of care discussion: full code  Family Communication: family was present at bedside, at the time of interview. The pt provided permission to discuss medical plan with the family. Opportunity was given to ask question and all questions were answered satisfactorily.   Disposition:  Discharge to home likely tomorrow.  Consultants: none Procedures: none  Antibiotics: Anti-infectives (From admission, onward)   None       Objective: Physical Exam: Vitals:   02/15/18 2107 02/15/18 2150 02/16/18 0504 02/16/18 1345  BP: 126/80  133/79 130/84  Pulse: 88  90 79  Resp: '20  15 14  ' Temp: 97.9 F (36.6 C)  98.3 F (36.8 C) 99.2 F (37.3 C)  TempSrc: Oral  Oral Oral  SpO2: 95%  97% 96%  Weight:  83.7 kg (184 lb 8 oz) 83 kg (183 lb)   Height:  5' 6.5" (1.689 m)      Intake/Output Summary (Last 24 hours) at 02/16/2018 1346 Last data filed at 02/16/2018 1053 Gross per 24 hour  Intake 1793.75 ml  Output 1870 ml  Net -76.25 ml   Filed Weights   02/15/18 1933 02/15/18 2150 02/16/18 0504  Weight: 83.9 kg (185 lb) 83.7 kg (184 lb 8 oz) 83 kg (183 lb)   General: Alert, Awake and Oriented to Time, Place and Person. Appear in mild distress, affect appropriate Eyes: PERRL, Conjunctiva normal ENT: Oral Mucosa clear moist. Neck: no JVD, no Abnormal Mass  Or lumps Cardiovascular: S1 and S2 Present, no Murmur, Peripheral Pulses Present Respiratory: normal respiratory effort, Bilateral Air entry equal and Decreased, no use of accessory muscle, Clear to Auscultation, no Crackles, no wheezes Abdomen: Bowel Sound present, Soft and no tenderness, no hernia Skin: no redness, no Rash, no induration Extremities: no Pedal edema, no calf  tenderness Neurologic: Grossly no focal neuro deficit. Bilaterally Equal motor strength  Data Reviewed: CBC: Recent Labs  Lab 02/10/18 0941 02/15/18 1806 02/16/18 0435  WBC 24.7* 3.9* 2.5*  NEUTROABS 22.6*  --   --   HGB 12.2 12.4 11.5*  HCT 37.2 37.4 34.2*  MCV 93.7 93.7 92.4  PLT 309 176 462   Basic Metabolic Panel: Recent Labs  Lab 02/10/18 0941 02/15/18 1806 02/16/18 0435  NA 136 137 136  K 3.9 3.7 3.3*  CL 104 97* 98*  CO2 '23 30 31  ' GLUCOSE 252* 141* 142*  BUN 13 21* 18  CREATININE 0.97 1.10* 0.83  CALCIUM 9.4 8.4* 8.2*  MG  --  2.1  --   PHOS  --  4.6  --     Liver Function Tests: Recent Labs  Lab 02/10/18 0941 02/16/18 0435  AST 26 20  ALT 34 29  ALKPHOS 113 67  BILITOT 0.3 0.9  PROT 7.1 6.2*  ALBUMIN 3.8 3.4*   No results for input(s): LIPASE, AMYLASE in the last 168 hours. No results for input(s): AMMONIA in the last 168 hours. Coagulation Profile: No results for input(s): INR, PROTIME in the last 168 hours. Cardiac Enzymes: Recent Labs  Lab 02/15/18 2207 02/16/18 0435 02/16/18 1030  TROPONINI <0.03 <0.03 <0.03   BNP (last 3 results) No results for input(s): PROBNP in the last 8760 hours. CBG: No results for input(s): GLUCAP in the last 168 hours. Studies: Ct Head Wo Contrast  Result Date: 02/15/2018 CLINICAL DATA:  Near syncope today.  History of breast cancer EXAM: CT HEAD WITHOUT CONTRAST TECHNIQUE: Contiguous axial images were obtained from the base of the skull through the vertex without intravenous contrast. COMPARISON:  MR brain December 26, 2005 FINDINGS: Brain: There is question low-density lesion in the left frontal lobe image 25. There is no midline shift, hydrocephalus, acute hemorrhage or acute transcortical infarct. Vascular: No hyperdense vessel or unexpected calcification. Skull: Normal. Negative for fracture or focal lesion. Sinuses/Orbits: No acute finding. Other: None. IMPRESSION: Question low-density lesion in the left frontal  lobe. Further evaluation with MRI of the brain with and without contrast is recommended. Electronically Signed   By: Abelardo Diesel M.D.   On: 02/15/2018 18:53   Ct Angio Chest Pe W Or Wo Contrast  Result Date: 02/16/2018 CLINICAL DATA:  Acute onset of syncope. Elevated D-dimer. EXAM: CT ANGIOGRAPHY CHEST WITH CONTRAST TECHNIQUE: Multidetector CT imaging of the chest was performed using the standard protocol during bolus administration of intravenous contrast. Multiplanar CT image reconstructions and MIPs were obtained to evaluate the vascular anatomy. CONTRAST:  114m ISOVUE-370 IOPAMIDOL (ISOVUE-370) INJECTION 76% COMPARISON:  Chest radiograph performed 01/17/2018 FINDINGS: Cardiovascular: There is no evidence of pulmonary embolus. The thoracic aorta is unremarkable. The great vessels are within normal limits. The heart is borderline normal in size. Mediastinum/Nodes: The mediastinum is unremarkable. No mediastinal lymphadenopathy is seen. No pericardial effusion is identified. A 1.3 cm hypodensity is noted at the right thyroid lobe, likely benign given its size. No axillary lymphadenopathy is seen. The patient's right-sided chest port is noted ending about the right cavoatrial junction. Lungs/Pleura: The lungs are essentially clear  bilaterally. No focal consolidation, pleural effusion or pneumothorax is seen. No masses are identified. Upper Abdomen: The visualized portions of the liver and spleen are unremarkable. Musculoskeletal: No acute osseous abnormalities are identified. The visualized musculature is unremarkable in appearance. A 2.1 cm soft tissue nodule is noted at the 4 o'clock position of the left breast, relatively close to the nipple. Would correlate with the patient's recent treatment for breast cancer. Review of the MIP images confirms the above findings. IMPRESSION: 1. No evidence of pulmonary embolus. 2. Lungs clear bilaterally. 3. 2.1 cm soft tissue nodule at the 4 o'clock position of the left  breast, relatively close to the nipple. Would correlate with the patient's recent treatment for breast cancer. Electronically Signed   By: Garald Balding M.D.   On: 02/16/2018 01:27    Scheduled Meds: . enoxaparin (LOVENOX) injection  40 mg Subcutaneous QHS  . losartan  50 mg Oral Daily  . potassium chloride SA  40 mEq Oral Daily   Continuous Infusions: PRN Meds: acetaminophen **OR** acetaminophen, loperamide, LORazepam, ondansetron, sodium chloride flush  Time spent: 35 minutes  Author: Berle Mull, MD [facility] Pager: [phone] 02/16/2018 1:46 PM  If 7PM-7AM, please contact night-coverage at [email or web address], password [facility]

## 2018-02-16 NOTE — Plan of Care (Signed)

## 2018-02-16 NOTE — Progress Notes (Signed)
D dimer returns elevated at 1.13.  Have notified Dr. Maudie Mercury to see if he wants CTA ordered STAT or for the early morning tomorrow.  Pt NAD.  Awaiting reply.

## 2018-02-17 ENCOUNTER — Encounter: Payer: Self-pay | Admitting: *Deleted

## 2018-02-17 ENCOUNTER — Observation Stay (HOSPITAL_COMMUNITY): Payer: 59

## 2018-02-17 ENCOUNTER — Encounter (HOSPITAL_COMMUNITY): Payer: Self-pay

## 2018-02-17 DIAGNOSIS — R55 Syncope and collapse: Secondary | ICD-10-CM

## 2018-02-17 MED ORDER — HEPARIN SOD (PORK) LOCK FLUSH 100 UNIT/ML IV SOLN
500.0000 [IU] | INTRAVENOUS | Status: AC | PRN
  Administered 2018-02-17: 500 [IU]

## 2018-02-18 ENCOUNTER — Other Ambulatory Visit: Payer: 59

## 2018-02-18 ENCOUNTER — Inpatient Hospital Stay (HOSPITAL_BASED_OUTPATIENT_CLINIC_OR_DEPARTMENT_OTHER): Payer: 59 | Admitting: Adult Health

## 2018-02-18 ENCOUNTER — Inpatient Hospital Stay: Payer: 59

## 2018-02-18 ENCOUNTER — Telehealth: Payer: Self-pay | Admitting: Adult Health

## 2018-02-18 ENCOUNTER — Encounter: Payer: Self-pay | Admitting: *Deleted

## 2018-02-18 ENCOUNTER — Ambulatory Visit: Payer: 59 | Admitting: Oncology

## 2018-02-18 ENCOUNTER — Other Ambulatory Visit: Payer: Self-pay | Admitting: *Deleted

## 2018-02-18 ENCOUNTER — Encounter: Payer: Self-pay | Admitting: Adult Health

## 2018-02-18 VITALS — BP 137/79 | HR 80 | Temp 98.6°F | Resp 19 | Ht 66.5 in | Wt 185.7 lb

## 2018-02-18 DIAGNOSIS — Z5112 Encounter for antineoplastic immunotherapy: Secondary | ICD-10-CM | POA: Diagnosis not present

## 2018-02-18 DIAGNOSIS — Z170 Estrogen receptor positive status [ER+]: Principal | ICD-10-CM

## 2018-02-18 DIAGNOSIS — R55 Syncope and collapse: Secondary | ICD-10-CM | POA: Diagnosis not present

## 2018-02-18 DIAGNOSIS — Z801 Family history of malignant neoplasm of trachea, bronchus and lung: Secondary | ICD-10-CM | POA: Diagnosis not present

## 2018-02-18 DIAGNOSIS — R197 Diarrhea, unspecified: Secondary | ICD-10-CM

## 2018-02-18 DIAGNOSIS — D509 Iron deficiency anemia, unspecified: Secondary | ICD-10-CM | POA: Diagnosis not present

## 2018-02-18 DIAGNOSIS — C50512 Malignant neoplasm of lower-outer quadrant of left female breast: Secondary | ICD-10-CM

## 2018-02-18 DIAGNOSIS — Z79899 Other long term (current) drug therapy: Secondary | ICD-10-CM

## 2018-02-18 DIAGNOSIS — Z7689 Persons encountering health services in other specified circumstances: Secondary | ICD-10-CM | POA: Diagnosis not present

## 2018-02-18 DIAGNOSIS — I10 Essential (primary) hypertension: Secondary | ICD-10-CM

## 2018-02-18 DIAGNOSIS — Z5111 Encounter for antineoplastic chemotherapy: Secondary | ICD-10-CM | POA: Diagnosis not present

## 2018-02-18 DIAGNOSIS — Z95828 Presence of other vascular implants and grafts: Secondary | ICD-10-CM

## 2018-02-18 LAB — CBC WITH DIFFERENTIAL (CANCER CENTER ONLY)
Basophils Absolute: 0 10*3/uL (ref 0.0–0.1)
Basophils Relative: 0 %
EOS ABS: 0 10*3/uL (ref 0.0–0.5)
Eosinophils Relative: 0 %
HEMATOCRIT: 33.7 % — AB (ref 34.8–46.6)
HEMOGLOBIN: 10.9 g/dL — AB (ref 11.6–15.9)
LYMPHS ABS: 1.3 10*3/uL (ref 0.9–3.3)
Lymphocytes Relative: 72 %
MCH: 30.4 pg (ref 25.1–34.0)
MCHC: 32.3 g/dL (ref 31.5–36.0)
MCV: 94.1 fL (ref 79.5–101.0)
MONO ABS: 0.5 10*3/uL (ref 0.1–0.9)
MONOS PCT: 26 %
NEUTROS ABS: 0 10*3/uL — AB (ref 1.5–6.5)
NEUTROS PCT: 2 %
Platelet Count: 169 10*3/uL (ref 145–400)
RBC: 3.58 MIL/uL — ABNORMAL LOW (ref 3.70–5.45)
RDW: 14.3 % (ref 11.2–14.5)
WBC Count: 1.9 10*3/uL — ABNORMAL LOW (ref 3.9–10.3)

## 2018-02-18 LAB — CMP (CANCER CENTER ONLY)
ALK PHOS: 82 U/L (ref 40–150)
ALT: 20 U/L (ref 0–55)
ANION GAP: 5 (ref 3–11)
AST: 16 U/L (ref 5–34)
Albumin: 3.7 g/dL (ref 3.5–5.0)
BILIRUBIN TOTAL: 0.4 mg/dL (ref 0.2–1.2)
BUN: 10 mg/dL (ref 7–26)
CALCIUM: 9.2 mg/dL (ref 8.4–10.4)
CO2: 29 mmol/L (ref 22–29)
Chloride: 105 mmol/L (ref 98–109)
Creatinine: 0.75 mg/dL (ref 0.60–1.10)
GFR, Estimated: >60 mL/min (ref >60)
Glucose, Bld: 93 mg/dL (ref 70–140)
Potassium: 3.7 mmol/L (ref 3.5–5.1)
Sodium: 139 mmol/L (ref 136–145)
TOTAL PROTEIN: 6.7 g/dL (ref 6.4–8.3)

## 2018-02-18 MED ORDER — HEPARIN SOD (PORK) LOCK FLUSH 100 UNIT/ML IV SOLN
250.0000 [IU] | Freq: Once | INTRAVENOUS | Status: AC
  Administered 2018-02-18: 250 [IU]
  Filled 2018-02-18: qty 5

## 2018-02-18 MED ORDER — AMOXICILLIN-POT CLAVULANATE 875-125 MG PO TABS: 1.0000 | ORAL_TABLET | Freq: Two times a day (BID) | ORAL | 0 refills | Status: DC

## 2018-02-18 MED ORDER — SODIUM CHLORIDE 0.9 % IV SOLN
Freq: Once | INTRAVENOUS | Status: AC
  Administered 2018-02-18: 16:00:00 via INTRAVENOUS

## 2018-02-18 MED ORDER — SODIUM CHLORIDE 0.9% FLUSH
10.0000 mL | Freq: Once | INTRAVENOUS | Status: AC
  Administered 2018-02-18: 10 mL
  Filled 2018-02-18: qty 10

## 2018-02-18 MED ORDER — CIPROFLOXACIN HCL 500 MG PO TABS: 500.0000 mg | ORAL_TABLET | Freq: Two times a day (BID) | ORAL | 0 refills | Status: DC

## 2018-02-18 NOTE — Telephone Encounter (Signed)
Gave patient avs and calendar of upcoming June appointments.  °

## 2018-02-18 NOTE — Patient Instructions (Signed)
Dehydration, Adult Dehydration is a condition in which there is not enough fluid or water in the body. This happens when you lose more fluids than you take in. Important organs, such as the kidneys, brain, and heart, cannot function without a proper amount of fluids. Any loss of fluids from the body can lead to dehydration. Dehydration can range from mild to severe. This condition should be treated right away to prevent it from becoming severe. What are the causes? This condition may be caused by:  Vomiting.  Diarrhea.  Excessive sweating, such as from heat exposure or exercise.  Not drinking enough fluid, especially: ? When ill. ? While doing activity that requires a lot of energy.  Excessive urination.  Fever.  Infection.  Certain medicines, such as medicines that cause the body to lose excess fluid (diuretics).  Inability to access safe drinking water.  Reduced physical ability to get adequate water and food.  What increases the risk? This condition is more likely to develop in people:  Who have a poorly controlled long-term (chronic) illness, such as diabetes, heart disease, or kidney disease.  Who are age 65 or older.  Who are disabled.  Who live in a place with high altitude.  Who play endurance sports.  What are the signs or symptoms? Symptoms of mild dehydration may include:  Thirst.  Dry lips.  Slightly dry mouth.  Dry, warm skin.  Dizziness. Symptoms of moderate dehydration may include:  Very dry mouth.  Muscle cramps.  Dark urine. Urine may be the color of tea.  Decreased urine production.  Decreased tear production.  Heartbeat that is irregular or faster than normal (palpitations).  Headache.  Light-headedness, especially when you stand up from a sitting position.  Fainting (syncope). Symptoms of severe dehydration may include:  Changes in skin, such as: ? Cold and clammy skin. ? Blotchy (mottled) or pale skin. ? Skin that does  not quickly return to normal after being lightly pinched and released (poor skin turgor).  Changes in body fluids, such as: ? Extreme thirst. ? No tear production. ? Inability to sweat when body temperature is high, such as in hot weather. ? Very little urine production.  Changes in vital signs, such as: ? Weak pulse. ? Pulse that is more than 100 beats a minute when sitting still. ? Rapid breathing. ? Low blood pressure.  Other changes, such as: ? Sunken eyes. ? Cold hands and feet. ? Confusion. ? Lack of energy (lethargy). ? Difficulty waking up from sleep. ? Short-term weight loss. ? Unconsciousness. How is this diagnosed? This condition is diagnosed based on your symptoms and a physical exam. Blood and urine tests may be done to help confirm the diagnosis. How is this treated? Treatment for this condition depends on the severity. Mild or moderate dehydration can often be treated at home. Treatment should be started right away. Do not wait until dehydration becomes severe. Severe dehydration is an emergency and it needs to be treated in a hospital. Treatment for mild dehydration may include:  Drinking more fluids.  Replacing salts and minerals in your blood (electrolytes) that you may have lost. Treatment for moderate dehydration may include:  Drinking an oral rehydration solution (ORS). This is a drink that helps you replace fluids and electrolytes (rehydrate). It can be found at pharmacies and retail stores. Treatment for severe dehydration may include:  Receiving fluids through an IV tube.  Receiving an electrolyte solution through a feeding tube that is passed through your nose   and into your stomach (nasogastric tube, or NG tube).  Correcting any abnormalities in electrolytes.  Treating the underlying cause of dehydration. Follow these instructions at home:  If directed by your health care provider, drink an ORS: ? Make an ORS by following instructions on the  package. ? Start by drinking small amounts, about  cup (120 mL) every 5-10 minutes. ? Slowly increase how much you drink until you have taken the amount recommended by your health care provider.  Drink enough clear fluid to keep your urine clear or pale yellow. If you were told to drink an ORS, finish the ORS first, then start slowly drinking other clear fluids. Drink fluids such as: ? Water. Do not drink only water. Doing that can lead to having too little salt (sodium) in the body (hyponatremia). ? Ice chips. ? Fruit juice that you have added water to (diluted fruit juice). ? Low-calorie sports drinks.  Avoid: ? Alcohol. ? Drinks that contain a lot of sugar. These include high-calorie sports drinks, fruit juice that is not diluted, and soda. ? Caffeine. ? Foods that are greasy or contain a lot of fat or sugar.  Take over-the-counter and prescription medicines only as told by your health care provider.  Do not take sodium tablets. This can lead to having too much sodium in the body (hypernatremia).  Eat foods that contain a healthy balance of electrolytes, such as bananas, oranges, potatoes, tomatoes, and spinach.  Keep all follow-up visits as told by your health care provider. This is important. Contact a health care provider if:  You have abdominal pain that: ? Gets worse. ? Stays in one area (localizes).  You have a rash.  You have a stiff neck.  You are more irritable than usual.  You are sleepier or more difficult to wake up than usual.  You feel weak or dizzy.  You feel very thirsty.  You have urinated only a small amount of very dark urine over 6-8 hours. Get help right away if:  You have symptoms of severe dehydration.  You cannot drink fluids without vomiting.  Your symptoms get worse with treatment.  You have a fever.  You have a severe headache.  You have vomiting or diarrhea that: ? Gets worse. ? Does not go away.  You have blood or green matter  (bile) in your vomit.  You have blood in your stool. This may cause stool to look black and tarry.  You have not urinated in 6-8 hours.  You faint.  Your heart rate while sitting still is over 100 beats a minute.  You have trouble breathing. This information is not intended to replace advice given to you by your health care provider. Make sure you discuss any questions you have with your health care provider. Document Released: 08/27/2005 Document Revised: 03/23/2016 Document Reviewed: 10/21/2015 [employer] Interactive Patient Education  2018 [employer]  

## 2018-02-18 NOTE — Progress Notes (Addendum)
[place]  [phone]) [phone] [phone]) [phone]     ID: Lindsey Wright DOB: 18-Apr-1966  MR#: [id number]  IOM#:[id number]  Patient Care Team: [name], MD as PCP - General Magrinat, [name], MD as Consulting Physician (Oncology) Vania Rea, MD as Consulting Physician (Obstetrics and Gynecology) [name], MD as Consulting Physician (General Surgery) Kyung Rudd, MD as Consulting Physician (Radiation Oncology) OTHER MD:   CHIEF COMPLAINT: HER-2 positive breast cancer  CURRENT TREATMENT: Neoadjuvant chemotherapy  INTERVAL HISTORY: Lindsey Wright returns today for a follow-up and treatment of her HER-2 positive breast cancer. She is cycle 2 day 8 of treatment with neoadjuvant chemotherapy with Docetaxel, Carboplatin, Trastuzumab, Pertuzumab.  Lindsey Wright did not tolerate treatment very well with this past cycle.  She unfortunately passed out at her daughter's graduation.  She was evaluated for  Syncopal episode, and underwent MRI brain and CTA that were both negative.    REVIEW OF SYSTEMS: Lindsey Wright's diarrhea started on Saturday, she experienced it at 10 am and 11am, and took an imodium each time.  She then took a compazine a few hours later, and had minimal PO intake.  Unfortunately at her daughter's graduation she passed out and EMS came and she was transported to [facility]  She underwent several tests to rule out seizures, and an arrhythmia.  She was discharged from [facility] yesterday.  Her 1/2 tab of Triamterene-HCTZ was stopped.  She also experienced diarrhea today, and had two episodes between 1130 and 12.  She describes the diarrhea as watery and as a large volume.  She doesn't feel like the imodium is helping.    Lindsey Wright also noted nausea after chemotherapy, her anti nausea medications helped this.    HISTORY OF CURRENT ILLNESS: From the original intake note:  "Lindsey Wright" palpated a mass in the left breast about 2 weeks ago.  She followed up with her gynecologist, Dr. Jean Rosenthal who referred her to the [facility] for mammography. She underwent unilateral left diagnostic mammography with tomography and left breast ultrasonography at [facility] on 12/20[facility]2017 showing: breast density category B. Suspicious newly palpable left breast mass at the 5 o'clock lower outer position with internal blow flow measuring 2.3 x 1.8 x 2.5 cm. There are either 2 adjacent masses or a single complicated mass at the 8:45 lower outer position, 4 cm from the nipple measuring 1.8 x 0.5 by 0.9 cm which may represent fibrocystic changes versus a solid mass. No other suspicious findings. Ultrasound showed normal axillary nodes.  Accordingly on 12/23/2017 she proceeded to biopsy of the left breast areas in question. The pathology from this procedure showed ([id number]): At both the 5 o'clock spanning 1.2 cm and 3:30 position spanning 0.7 cm, invasive ductal carcinoma, grade III. Prognostic indicators significant for: estrogen receptor, 10% positive with weak staining intensity and progesterone receptor, 0% negative. Proliferation marker Ki67 at 90%. HER2 amplified with ratios ER2/CEP17 signals 2.33 and average HER2 copies per cell 4.65  The patient's subsequent history is as detailed below.   PAST MEDICAL HISTORY: Past Medical History:  Diagnosis Date  . Anemia    iron deficient anemia  . Breast cancer ([place])   . History of Bell's palsy     x 2 as teenager and in 20's  . Hypertension   . Menorrhagia   . Overweight(278.02)     PAST SURGICAL HISTORY: Past Surgical History:  Procedure Laterality Date  . BREAST BIOPSY Right 02/01/2015   benign  . CESAREAN SECTION  CLASSICAL    . CHOLECYSTECTOMY    . COSMETIC SURGERY    . PORTACATH PLACEMENT Right 01/17/2018   Procedure: INSERTION PORT-A-CATH;  Surgeon: [name], MD;  Location: [facility];  Service: General;  Laterality: Right;  . TUBAL LIGATION      FAMILY  HISTORY Family History  Problem Relation Age of Onset  . Hypertension Mother   . Diabetes Mother   . Stroke Mother   . Alcohol abuse Father   . Cancer Father        lung ca  . Hypertension Father   . Hypertension Sister   . Colon cancer Neg Hx   . Colon polyps Neg Hx   . Rectal cancer Neg Hx   . Stomach cancer Neg Hx    The patient's father was diagnosed with lung cancer at age 66 and died the same year. The patient's mother died at age 7 due to several strokes. The patient had 1 brother who died due to strokes and possibly a MI. The patient has 1 sister. She denies a history of breast or ovarian cancer in the family.    GYNECOLOGIC HISTORY:  No LMP recorded. (Menstrual status: Perimenopausal). Menarche: 52 years old Age at first live birth: 52 years old The patient is GXP2. The patient is not having periods, with her LMP being in 2010. She used oral contraceptive for about 10 years with no complications. She never used HRT.    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 Her husband, Lindsey Wright, works part time in Therapist, art.  The patient's daughter Lindsey Wright age 51, is a Ship broker and starting a job. The patient's daughter Lindsey Wright age 21, is a Ship broker.  Both of the patient's daughters live with her.     ADVANCED DIRECTIVES:    HEALTH MAINTENANCE: Social History   Tobacco Use  . Smoking status: Never Smoker  . Smokeless tobacco: Never Used  Substance Use Topics  . Alcohol use: No    Alcohol/week: 0.0 oz  . Drug use: No     Colonoscopy: 03/27/2017 polyp removal/ Dr. Deeann Saint  PAP: May 2017 normal  Bone density:   Allergies  Allergen Reactions  . Ace Inhibitors Cough    Current Outpatient Medications  Medication Sig Dispense Refill  . dexamethasone (DECADRON) 4 MG tablet Take 2 tablets (8 mg total) by mouth 2 (two) times daily. Start the day before Taxotere. Take once the day after, then 2 times a day x 2d. 30 tablet 1  .  lidocaine-prilocaine (EMLA) cream Apply to affected area once 30 g 3  . loperamide (LOPERAMIDE A-D) 2 MG tablet Take 4 mg by mouth as needed for diarrhea or loose stools.    Marland Kitchen LORazepam (ATIVAN) 0.5 MG tablet Take 1 tablet (0.5 mg total) by mouth at bedtime as needed (Nausea or vomiting). 30 tablet 0  . losartan (COZAAR) 50 MG tablet TAKE 1 TABLET (50 MG TOTAL) BY MOUTH DAILY. 30 tablet 11  . ondansetron (ZOFRAN) 8 MG tablet Take 1 tablet (8 mg total) by mouth every 8 (eight) hours as needed for nausea or vomiting. 20 tablet 3  . potassium chloride SA (KLOR-CON M20) 20 MEQ tablet Take 2 tablets (40 mEq total) by mouth daily. 60 tablet 11  . prochlorperazine (COMPAZINE) 10 MG tablet Take 1 tablet (10 mg total) by mouth every 6 (six) hours as needed (Nausea or vomiting). 30 tablet 1   No current facility-administered medications for this visit.  OBJECTIVE: Vitals:   02/18/18 1425  BP: 137/79  Pulse: 80  Resp: 19  Temp: 98.6 F (37 C)  SpO2: 100%     Body mass index is 29.52 kg/m.   Wt Readings from Last 3 Encounters:  02/18/18 185 lb 11.2 oz (84.2 kg)  02/17/18 184 lb 8 oz (83.7 kg)  02/10/18 191 lb 14.4 oz (87 kg)  ECOG FS:1 GENERAL: Patient is a well appearing female in no acute distress HEENT:  Sclerae anicteric.  Oropharynx clear and moist. No ulcerations or evidence of oropharyngeal candidiasis. Neck is supple.  NODES:  No cervical, supraclavicular, or axillary lymphadenopathy palpated.  BREAST EXAM:  Deferred. LUNGS:  Clear to auscultation bilaterally.  No wheezes or rhonchi. HEART:  Regular rate and rhythm. No murmur appreciated. ABDOMEN:  Soft, nontender.  Positive, normoactive bowel sounds. No organomegaly palpated. MSK:  No focal spinal tenderness to palpation. Full range of motion bilaterally in the upper extremities. EXTREMITIES:  No peripheral edema.   SKIN:  Clear with no obvious rashes or skin changes. No nail dyscrasia. NEURO:  Nonfocal. Well oriented.   Appropriate affect.    LAB RESULTS:  CMP     Component Value Date/Time   NA 139 02/18/2018 1326   K 3.7 02/18/2018 1326   CL 105 02/18/2018 1326   CO2 29 02/18/2018 1326   GLUCOSE 93 02/18/2018 1326   BUN 10 02/18/2018 1326   CREATININE 0.75 02/18/2018 1326   CALCIUM 9.2 02/18/2018 1326   PROT 6.7 02/18/2018 1326   ALBUMIN 3.7 02/18/2018 1326   AST 16 02/18/2018 1326   ALT 20 02/18/2018 1326   ALKPHOS 82 02/18/2018 1326   BILITOT 0.4 02/18/2018 1326   GFRNONAA >60 02/18/2018 1326   GFRAA >60 02/18/2018 1326    No results found for: TOTALPROTELP, ALBUMINELP, A1GS, A2GS, BETS, BETA2SER, GAMS, MSPIKE, SPEI  No results found for: KPAFRELGTCHN, LAMBDASER, KAPLAMBRATIO  Lab Results  Component Value Date   WBC 1.9 (L) 02/18/2018   NEUTROABS 0.0 (LL) 02/18/2018   HGB 10.9 (L) 02/18/2018   HCT 33.7 (L) 02/18/2018   MCV 94.1 02/18/2018   PLT 169 02/18/2018    '@LASTCHEMISTRY' @  No results found for: LABCA2  No components found for: QIHKVQ259  No results for input(s): INR in the last 168 hours.  No results found for: LABCA2  No results found for: DGL875  No results found for: IEP329  No results found for: JJO841  No results found for: CA2729  No components found for: HGQUANT  No results found for: CEA1 / No results found for: CEA1   No results found for: AFPTUMOR  No results found for: CHROMOGRNA  No results found for: PSA1  Appointment on 02/18/2018  Component Date Value Ref Range Status  . WBC Count 02/18/2018 1.9* 3.9 - 10.3 K/uL Final  . RBC 02/18/2018 3.58* 3.70 - 5.45 MIL/uL Final  . Hemoglobin 02/18/2018 10.9* 11.6 - 15.9 g/dL Final  . HCT 02/18/2018 33.7* 34.8 - 46.6 % Final  . MCV 02/18/2018 94.1  79.5 - 101.0 fL Final  . MCH 02/18/2018 30.4  25.1 - 34.0 pg Final  . MCHC 02/18/2018 32.3  31.5 - 36.0 g/dL Final  . RDW 02/18/2018 14.3  11.2 - 14.5 % Final  . Platelet Count 02/18/2018 169  145 - 400 K/uL Final  . Neutrophils Relative %  02/18/2018 2  % Final  . Neutro Abs 02/18/2018 0.0* 1.5 - 6.5 K/uL Final   CRITICAL RESULT CALLED TO, READ BACK BY  AND VERIFIED WITH: Arabella Merles RN    . Lymphocytes Relative 02/18/2018 72  % Final  . Lymphs Abs 02/18/2018 1.3  0.9 - 3.3 K/uL Final  . Monocytes Relative 02/18/2018 26  % Final  . Monocytes Absolute 02/18/2018 0.5  0.1 - 0.9 K/uL Final  . Eosinophils Relative 02/18/2018 0  % Final  . Eosinophils Absolute 02/18/2018 0.0  0.0 - 0.5 K/uL Final  . Basophils Relative 02/18/2018 0  % Final  . Basophils Absolute 02/18/2018 0.0  0.0 - 0.1 K/uL Final   Performed at [facility], [place] [address], [place], [place] [address]  . Sodium 02/18/2018 139  136 - 145 mmol/L Final  . Potassium 02/18/2018 3.7  3.5 - 5.1 mmol/L Final  . Chloride 02/18/2018 105  98 - 109 mmol/L Final  . CO2 02/18/2018 29  22 - 29 mmol/L Final  . Glucose, Bld 02/18/2018 93  70 - 140 mg/dL Final  . BUN 02/18/2018 10  7 - 26 mg/dL Final  . Creatinine 02/18/2018 0.75  0.60 - 1.10 mg/dL Final  . Calcium 02/18/2018 9.2  8.4 - 10.4 mg/dL Final  . Total Protein 02/18/2018 6.7  6.4 - 8.3 g/dL Final  . Albumin 02/18/2018 3.7  3.5 - 5.0 g/dL Final  . AST 02/18/2018 16  5 - 34 U/L Final  . ALT 02/18/2018 20  0 - 55 U/L Final  . Alkaline Phosphatase 02/18/2018 82  40 - 150 U/L Final  . Total Bilirubin 02/18/2018 0.4  0.2 - 1.2 mg/dL Final  . GFR, Est Non Af Am 02/18/2018 >60  >60 mL/min Final  . GFR, Est AFR Am 02/18/2018 >60  >60 mL/min Final   Comment: (NOTE) The eGFR has been calculated using the CKD EPI equation. This calculation has not been validated in all clinical situations. eGFR's persistently <60 mL/min signify possible Chronic Kidney Disease.   Georgiann Hahn gap 02/18/2018 5  3 - 11 Final   Performed at [facility], [place] [address] [place], [place] [address]  Admission on 02/15/2018, Discharged on 02/17/2018  Component Date Value Ref Range Status  . Sodium  02/15/2018 137  135 - 145 mmol/L Final  . Potassium 02/15/2018 3.7  3.5 - 5.1 mmol/L Final  . Chloride 02/15/2018 97* 101 - 111 mmol/L Final  . CO2 02/15/2018 30  22 - 32 mmol/L Final  . Glucose, Bld 02/15/2018 141* 65 - 99 mg/dL Final  . BUN 02/15/2018 21* 6 - 20 mg/dL Final  . Creatinine, Ser 02/15/2018 1.10* 0.44 - 1.00 mg/dL Final  . Calcium 02/15/2018 8.4* 8.9 - 10.3 mg/dL Final  . GFR calc non Af Amer 02/15/2018 57* >60 mL/min Final  . GFR calc Af Amer 02/15/2018 >60  >60 mL/min Final   Comment: (NOTE) The eGFR has been calculated using the CKD EPI equation. This calculation has not been validated in all clinical situations. eGFR's persistently <60 mL/min signify possible Chronic Kidney Disease.   Georgiann Hahn gap 02/15/2018 10  5 - 15 Final   Performed at [facility], [place] [address], [place], [place] [address]  . WBC 02/15/2018 3.9* 4.0 - 10.5 K/uL Final  . RBC 02/15/2018 3.99  3.87 - 5.11 MIL/uL Final  . Hemoglobin 02/15/2018 12.4  12.0 - 15.0 g/dL Final  . HCT 02/15/2018 37.4  36.0 - 46.0 % Final  . MCV 02/15/2018 93.7  78.0 - 100.0 fL Final  . MCH 02/15/2018 31.1  26.0 - 34.0 pg  Final  . MCHC 02/15/2018 33.2  30.0 - 36.0 g/dL Final  . RDW 02/15/2018 14.2  11.5 - 15.5 % Final  . Platelets 02/15/2018 176  150 - 400 K/uL Final   Performed at [facility], [place] [address], [place], [place] [address]  . Color, Urine 02/15/2018 YELLOW  YELLOW Final  . APPearance 02/15/2018 CLEAR  CLEAR Final  . Specific Gravity, Urine 02/15/2018 1.011  1.005 - 1.030 Final  . pH 02/15/2018 7.0  5.0 - 8.0 Final  . Glucose, UA 02/15/2018 NEGATIVE  NEGATIVE mg/dL Final  . Hgb urine dipstick 02/15/2018 SMALL* NEGATIVE Final  . Bilirubin Urine 02/15/2018 NEGATIVE  NEGATIVE Final  . Ketones, ur 02/15/2018 NEGATIVE  NEGATIVE mg/dL Final  . Protein, ur 02/15/2018 NEGATIVE  NEGATIVE mg/dL Final  . Nitrite 02/15/2018 NEGATIVE  NEGATIVE Final  . Leukocytes, UA 02/15/2018  NEGATIVE  NEGATIVE Final  . RBC / HPF 02/15/2018 0-5  0 - 5 RBC/hpf Final  . WBC, UA 02/15/2018 0-5  0 - 5 WBC/hpf Final  . Bacteria, UA 02/15/2018 NONE SEEN  NONE SEEN Final  . Mucus 02/15/2018 PRESENT   Final  . Hyaline Casts, UA 02/15/2018 PRESENT   Final   Performed at [facility], [place] [address], [place], [place] [address]  . Magnesium 02/15/2018 2.1  1.7 - 2.4 mg/dL Final   Performed at [place] [address], [place], [place] [address]  . Phosphorus 02/15/2018 4.6  2.5 - 4.6 mg/dL Final   Performed at [place] [address], [place], [place] [address]  . Troponin I 02/15/2018 <0.03  <0.03 ng/mL Final   Performed at [facility], [place] [address] [place], [place] [address]  . Troponin I 02/16/2018 <0.03  <0.03 ng/mL Final   Performed at [facility], [place] [address], [place], [place] [address]  . Troponin I 02/16/2018 <0.03  <0.03 ng/mL Final   Performed at [facility], [place] [address] [place], [place] [address]  . D-Dimer, Quant 02/15/2018 1.13* 0.00 - 0.50 ug/mL-FEU Final   Comment: (NOTE) At the manufacturer cut-off of 0.50 ug/mL FEU, this assay has been documented to exclude PE with a sensitivity and negative predictive value of 97 to 99%.  At this time, this assay has not been approved by the FDA to exclude DVT/VTE. Results should be correlated with clinical presentation. Performed at [facility], [place] [address] [place], [place] [address]   . HIV Screen 4th Generation wRfx 02/15/2018 Non Reactive  Non Reactive Final   Comment: (NOTE) Performed At: [facility] [place], [place] [id number] Rush Farmer MD NU:[id number] Performed at [facility], [place] [address], [place], [place] [address]   . Sodium 02/16/2018 136  135 - 145 mmol/L Final  . Potassium 02/16/2018 3.3* 3.5 - 5.1 mmol/L  Final  . Chloride 02/16/2018 98* 101 - 111 mmol/L Final  . CO2 02/16/2018 31  22 - 32 mmol/L Final  . Glucose, Bld 02/16/2018 142* 65 - 99 mg/dL Final  . BUN 02/16/2018 18  6 - 20 mg/dL Final  . Creatinine, Ser 02/16/2018 0.83  0.44 - 1.00 mg/dL Final  . Calcium 02/16/2018 8.2* 8.9 - 10.3 mg/dL Final  . Total Protein 02/16/2018 6.2* 6.5 - 8.1 g/dL Final  . Albumin 02/16/2018 3.4* 3.5 - 5.0 g/dL Final  . AST 02/16/2018 20  15 - 41 U/L Final  . ALT 02/16/2018 29  14 - 54 U/L Final  . Alkaline Phosphatase 02/16/2018 67  38 - 126 U/L Final  . Total Bilirubin 02/16/2018 0.9  0.3 - 1.2 mg/dL Final  . GFR calc non Af Amer 02/16/2018 >60  >60 mL/min Final  . GFR calc Af Amer 02/16/2018 >60  >60 mL/min Final   Comment: (NOTE) The eGFR has been calculated using the CKD EPI equation. This calculation has not been validated in all clinical situations. eGFR's persistently <60 mL/min signify possible Chronic Kidney Disease.   Georgiann Hahn gap 02/16/2018 7  5 - 15 Final   Performed at [facility], [place] [address] [place], [place] [address]  . WBC 02/16/2018 2.5* 4.0 - 10.5 K/uL Final  . RBC 02/16/2018 3.70* 3.87 - 5.11 MIL/uL Final  . Hemoglobin 02/16/2018 11.5* 12.0 - 15.0 g/dL Final  . HCT 02/16/2018 34.2* 36.0 - 46.0 % Final  . MCV 02/16/2018 92.4  78.0 - 100.0 fL Final  . MCH 02/16/2018 31.1  26.0 - 34.0 pg Final  . MCHC 02/16/2018 33.6  30.0 - 36.0 g/dL Final  . RDW 02/16/2018 14.3  11.5 - 15.5 % Final  . Platelets 02/16/2018 191  150 - 400 K/uL Final   Performed at [facility], [place] [address] [place], [place] [address]    (this displays the last labs from the last 3 days)  No results found for: TOTALPROTELP, ALBUMINELP, A1GS, A2GS, BETS, BETA2SER, GAMS, MSPIKE, SPEI (this displays SPEP labs)  No results found for: KPAFRELGTCHN, LAMBDASER, KAPLAMBRATIO (kappa/lambda light chains)  No results found for: HGBA, HGBA2QUANT, HGBFQUANT,  HGBSQUAN (Hemoglobinopathy evaluation)   No results found for: LDH  Lab Results  Component Value Date   IRON 104 09/27/2008   IRONPCTSAT 25.1 09/27/2008   (Iron and TIBC)  No results found for: FERRITIN  Urinalysis    Component Value Date/Time   COLORURINE YELLOW 02/15/2018 2014   APPEARANCEUR CLEAR 02/15/2018 2014   LABSPEC 1.011 02/15/2018 2014   PHURINE 7.0 02/15/2018 2014   [place] 02/15/2018 2014   [place] (A) 02/15/2018 2014   [place] NEGATIVE 02/15/2018 2014   [place] NEGATIVE 02/15/2018 2014   [place] NEGATIVE 02/15/2018 2014   NITRITE NEGATIVE 02/15/2018 2014   LEUKOCYTESUR NEGATIVE 02/15/2018 2014     STUDIES: Ct Head Wo Contrast  Result Date: 02/15/2018 CLINICAL DATA:  Near syncope today.  History of breast cancer EXAM: CT HEAD WITHOUT CONTRAST TECHNIQUE: Contiguous axial images were obtained from the base of the skull through the vertex without intravenous contrast. COMPARISON:  MR brain December 26, 2005 FINDINGS: Brain: There is question Wright-density lesion in the left frontal lobe image 25. There is no midline shift, hydrocephalus, acute hemorrhage or acute transcortical infarct. Vascular: No hyperdense vessel or unexpected calcification. Skull: Normal. Negative for fracture or focal lesion. Sinuses/Orbits: No acute finding. Other: None. IMPRESSION: Question Wright-density lesion in the left frontal lobe. Further evaluation with MRI of the brain with and without contrast is recommended. Electronically Signed   By: Abelardo Diesel M.D.   On: 02/15/2018 18:53   Ct Angio Chest Pe W Or Wo Contrast  Result Date: 02/16/2018 CLINICAL DATA:  Acute onset of syncope. Elevated D-dimer. EXAM: CT ANGIOGRAPHY CHEST WITH CONTRAST TECHNIQUE: Multidetector CT imaging of the chest was performed using the standard protocol during bolus administration of intravenous contrast. Multiplanar CT image reconstructions and MIPs were obtained to evaluate the vascular anatomy.  CONTRAST:  144m ISOVUE-370 IOPAMIDOL (ISOVUE-370) INJECTION 76% COMPARISON:  Chest radiograph performed  01/17/2018 FINDINGS: Cardiovascular: There is no evidence of pulmonary embolus. The thoracic aorta is unremarkable. The great vessels are within normal limits. The heart is borderline normal in size. Mediastinum/Nodes: The mediastinum is unremarkable. No mediastinal lymphadenopathy is seen. No pericardial effusion is identified. A 1.3 cm hypodensity is noted at the right thyroid lobe, likely benign given its size. No axillary lymphadenopathy is seen. The patient's right-sided chest port is noted ending about the right cavoatrial junction. Lungs/Pleura: The lungs are essentially clear bilaterally. No focal consolidation, pleural effusion or pneumothorax is seen. No masses are identified. Upper Abdomen: The visualized portions of the liver and spleen are unremarkable. Musculoskeletal: No acute osseous abnormalities are identified. The visualized musculature is unremarkable in appearance. A 2.1 cm soft tissue nodule is noted at the 4 o'clock position of the left breast, relatively close to the nipple. Would correlate with the patient's recent treatment for breast cancer. Review of the MIP images confirms the above findings. IMPRESSION: 1. No evidence of pulmonary embolus. 2. Lungs clear bilaterally. 3. 2.1 cm soft tissue nodule at the 4 o'clock position of the left breast, relatively close to the nipple. Would correlate with the patient's recent treatment for breast cancer. Electronically Signed   By: Garald Balding M.D.   On: 02/16/2018 01:27   Mr Jeri Cos JQ Contrast  Result Date: 02/16/2018 CLINICAL DATA:  Initial evaluation for acute dizziness, history of breast cancer. EXAM: MRI HEAD WITHOUT AND WITH CONTRAST TECHNIQUE: Multiplanar, multiecho pulse sequences of the brain and surrounding structures were obtained without and with intravenous contrast. CONTRAST:  72m MULTIHANCE GADOBENATE DIMEGLUMINE 529 MG/ML  IV SOLN COMPARISON:  Prior CT from 02/15/2018 as well as previous MRI from 12/26/2005. FINDINGS: Brain: Cerebral volume within normal limits for age. No significant cerebral white matter changes identified. No abnormal foci of restricted diffusion to suggest acute or subacute ischemia. Gray-white matter differentiation maintained. No evidence for chronic infarction. No acute or chronic intracranial hemorrhage. No mass lesion, midline shift or mass effect. No hydrocephalus. No extra-axial fluid collection. No abnormal enhancement. Major dural sinuses grossly patent. Pituitary gland within normal limits. Vascular: Major intracranial vascular flow voids maintained. Skull and upper cervical spine: Craniocervical junction within normal limits. Upper cervical spine normal. No marrow replacing lesion. Scalp soft tissues unremarkable. Sinuses/Orbits: Globes and orbital soft tissues within normal limits. Paranasal sinuses largely clear. No mastoid effusion. Inner ear structures normal. Other: None. IMPRESSION: Normal brain MRI for age. No acute intracranial abnormality identified. Electronically Signed   By: BJeannine BogaM.D.   On: 02/16/2018 22:42    ELIGIBLE FOR AVAILABLE RESEARCH PROTOCOL: Exact signs of study  ASSESSMENT: 52y.o.  [place] status post left breast lower outer quadrant biopsy 12/23/2017 for a clinically multifocal T2 N0, stage II invasive ductal carcinoma, grade 3, essentially estrogen receptor and progesterone receptor negative, but HER-2 amplified, with an MIB-1 of 90%.  (a) breast MRI 01/07/2018 shows the 2.6 cm main mass and 4 additional smaller masses spanning 7.1 cm; there was a suspicious left axillary lymph node  (b) left axillary lymph node biopsy 01/14/2018 was benign/discordant (no lymph node tissue)  (1) neoadjuvant chemotherapy will consist of carboplatin and docetaxel every 21 days for 6 cycles starting 01/20/2018  (2) anti-HER-2 immunotherapy will consist of  trastuzumab and Pertuzumab, to be continued for 6 months, starting concurrently with chemotherapy  (a) baseline echocardiogram 01/08/2018 shows an ejection fraction in the 60-65% range  (3) definitive surgery pending  (4) adjuvant radiation as appropriate  (  5) consider antiestrogens given the weak receptor positivity  PLAN:  Sieanna had some issues following her second cycle of treatment with Docetaxel, Carboplatin, Trastuzumab and Pertuzumab.  She unfortunately passed out before her daughter's graduation.  This was likely secondary to dehydration.  She met with myself and Dr. Jana Hakim to review her upcoming plan.  She was recommended to take 1/2 tablet of Losartan.  Stay off the HCTZ, and we will give her IV fluids for the next couple of days to help also.  She will also no longer receive Pertuzumab due to the diarrhea.    She also has neutropenia.  Neutropenic precautions were reviewed with her in detail. I sent Cipro and Augmentin in for her to take for the next 5 days.    Joury will return for the next two days for IV fluids, and then in 2 weeks for labs, f/u, and cycle 3 of her neoadjuvant chemotherapy.    Wilber Bihari, NP  02/18/18 2:48 PM Medical Oncology and Hematology [facility] [address] [place], [place] [address] Tel. [phone]    Fax. [phone]    ADDENDUM: I am concerned about the lanes very Wright neutrophil count as well as of course her recent episode of syncope.  I do think it was partly the diarrhea, which dehydrated it, and the fact that she was on blood pressure medication including hydrochlorothiazide, which dehydrated her further.  Of course chemotherapy itself can lower a person's blood pressure.  She received fluids in the hospital and we are going to give her fluids again this week.  She has stopped the hydrochlorothiazide.  We are cutting back on the losartan to 25 mg instead of 50.  We are going to prophylax her with both amoxicillin  and Cipro for the next 5 days to prevent a febrile neutropenia episode  I am also removing Perjeta from her chemo orders.  I will drop the docetaxel dose by 10%.  Hopefully with these changes she will be able to complete her chemotherapy without any further complications.   I personally saw this patient and performed a substantive portion of this encounter with the listed APP documented above.   Chauncey Cruel, MD Medical Oncology and Hematology [facility] [address] [place], [place] [address] Tel. [phone]    Fax. [phone]

## 2018-02-18 NOTE — Discharge Summary (Signed)
[facility] Discharge Summary   Patient: Lindsey Wright MPN:[id number]   PCP: [name], MD DOB: 1966-03-13   Date of admission: 02/15/2018   Date of discharge: 02/17/2018    Discharge Diagnoses:  Active Problems:   Syncope   Admitted From: Home Disposition: Home with home health  Recommendations for Outpatient Follow-up:  1. Follow-up with PCP in 1 week  Follow-up Information    Tower, Wynelle Fanny, MD. Schedule an appointment as soon as possible for a visit in 1 week(s).   Specialties:  Family Medicine, Radiology Contact information: [address] [place] [address] [phone]          Diet recommendation: Cardiac diet  Activity: The patient is advised to gradually reintroduce usual activities.  Discharge Condition: good  Code Status: Full code  History of present illness: As per the H and P dictated on admission, "Lindsey Wright  is a 52 y.o. female, w breast cancer , hypertension, apparently presents with c/o syncope while sitting at the [facility] watching her daughter graduate. Pt was out for about 5 minutes per her family.  Pt denies any postictal symptoms.  Pt denies presyncopal symptoms.  Pt denies headache, cp, palp, sob, n/v, diarrhea, brbpr,   In Ed,  CT brain IMPRESSION: Question low-density lesion in the left frontal lobe. Further evaluation with MRI of the brain with and without contrast is recommended.  Na 137, K 3.7, Glucose 141 Bun 21, Creatinine 1.10  Wbc 3.9, Hgb 12.4, Plt 176  Magnesium 2.1  Urinalysis negative  Pt will be admitted for syncope and also ? Low density lesion in the left frontal lobe."  Hospital Course:  Summary of her active problems in the hospital is as following. 1.  Syncope. Etiology is likely vasovagal with dehydration as a primary etiology due to diarrhea. Diarrhea currently resolved. Orthostatics are negative. Patient does not have any further episodes of dizziness or  lightheadedness. Neuro exam unremarkable. CT head shows an area of hypodensity,  Telemetry neurology was consulted, felt that the patient does not have any other explanation of her syncope other than vasovagal event.   Recent echocardiogram last month was showing preserved EF and no valvular abnormality. No further recurrence of the symptoms in the hospital. Telemetry was unremarkable as well. MRI brain was performed which was negative for any acute lesion. I feel that there is no further requirement for any more work-up for her syncope as it appears more likely vasovagal from dehydration. Close monitoring with PCP.  2.  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 On chemotherapy as well as radiation. Last infusion was last week. Outpatient follow-up. MRI brain with and without contrast negative for any brain lesion.  3.  Essential hypertension. Patient was on Maxide and losartan, likely both playing a role in patient's presentation with passing out event with ongoing diarrhea. Currently Maxide is on hold, losartan is continued due to high blood pressure.  4.  Hypokalemia. Likely from GI loss.  All other chronic medical condition were stable during the hospitalization.  Patient was ambulatory without any assistance. On the day of the discharge the patient's vitals were stable , and no other acute medical condition were reported by patient. the patient was felt safe to be discharge at home with family.  Consultants: none Procedures: none  DISCHARGE MEDICATION: Allergies as of 02/17/2018      Reactions   Ace Inhibitors Cough      Medication List    STOP taking these medications  triamterene-hydrochlorothiazide 75-50 MG tablet Commonly known as:  MAXZIDE     TAKE these medications   dexamethasone 4 MG tablet Commonly known as:  DECADRON Take 2 tablets (8 mg total) by mouth 2 (two) times daily. Start the day before Taxotere. Take once the day after, then 2 times a day x 2d.    lidocaine-prilocaine cream Commonly known as:  EMLA Apply to affected area once   LOPERAMIDE A-D 2 MG tablet Generic drug:  loperamide Take 4 mg by mouth as needed for diarrhea or loose stools.   LORazepam 0.5 MG tablet Commonly known as:  ATIVAN Take 1 tablet (0.5 mg total) by mouth at bedtime as needed (Nausea or vomiting).   losartan 50 MG tablet Commonly known as:  COZAAR TAKE 1 TABLET (50 MG TOTAL) BY MOUTH DAILY.   ondansetron 8 MG tablet Commonly known as:  ZOFRAN Take 1 tablet (8 mg total) by mouth every 8 (eight) hours as needed for nausea or vomiting.   potassium chloride SA 20 MEQ tablet Commonly known as:  KLOR-CON M20 Take 2 tablets (40 mEq total) by mouth daily.   prochlorperazine 10 MG tablet Commonly known as:  COMPAZINE Take 1 tablet (10 mg total) by mouth every 6 (six) hours as needed (Nausea or vomiting).      Allergies  Allergen Reactions  . Ace Inhibitors Cough   Discharge Instructions    Diet - low sodium heart healthy   Complete by:  As directed    Discharge instructions   Complete by:  As directed    It is important that you read following instructions as well as go over your medication list with RN to help you understand your care after this hospitalization.  Discharge Instructions: Please follow-up with PCP in one week  Please request your primary care physician to go over all Hospital Tests and Procedure/Radiological results at the follow up,  Please get all Hospital records sent to your PCP by signing hospital release before you go home.   Do not drive, operating heavy machinery, perform activities at heights, swimming or participation in water activities or provide baby sitting services as your were admitted for syncope while you are on Pain, Sleep and Anxiety Medications; until you have been seen by Primary Care Physician or a Neurologist and advised to do so again. Do not take more than prescribed Pain, Sleep and Anxiety  Medications. You were cared for by a hospitalist during your hospital stay. If you have any questions about your discharge medications or the care you received while you were in the hospital after you are discharged, you can call the unit and ask to speak with the hospitalist on call if the hospitalist that took care of you is not available.  Once you are discharged, your primary care physician will handle any further medical issues. Please note that NO REFILLS for any discharge medications will be authorized once you are discharged, as it is imperative that you return to your primary care physician (or establish a relationship with a primary care physician if you do not have one) for your aftercare needs so that they can reassess your need for medications and monitor your lab values. You Must read complete instructions/literature along with all the possible adverse reactions/side effects for all the Medicines you take and that have been prescribed to you. Take any new Medicines after you have completely understood and accept all the possible adverse reactions/side effects. Wear Seat belts while driving. If you have smoked  or chewed Tobacco in the last 2 yrs please stop smoking and/or stop any Recreational drug use.   Increase activity slowly   Complete by:  As directed      Discharge Exam: Filed Weights   02/15/18 2150 02/16/18 0504 02/17/18 0500  Weight: 83.7 kg (184 lb 8 oz) 83 kg (183 lb) 83.7 kg (184 lb 8 oz)   Vitals:   02/16/18 2252 02/17/18 0509  BP: 137/86 132/80  Pulse: 72 79  Resp: 18 16  Temp: 98.3 F (36.8 C) 98.6 F (37 C)  SpO2: 100% 98%   General: Appear in no distress, no Rash; Oral Mucosa moist. Cardiovascular: S1 and S2 Present, no Murmur, no JVD Respiratory: Bilateral Air entry present and Clear to Auscultation, no Crackles, no wheezes Abdomen: Bowel Sound [present, Soft and no tenderness Extremities: no Pedal edema, no calf tenderness Neurology: Grossly no focal  neuro deficit.  The results of significant diagnostics from this hospitalization (including imaging, microbiology, ancillary and laboratory) are listed below for reference.    Significant Diagnostic Studies: Ct Head Wo Contrast  Result Date: 02/15/2018 CLINICAL DATA:  Near syncope today.  History of breast cancer EXAM: CT HEAD WITHOUT CONTRAST TECHNIQUE: Contiguous axial images were obtained from the base of the skull through the vertex without intravenous contrast. COMPARISON:  MR brain December 26, 2005 FINDINGS: Brain: There is question low-density lesion in the left frontal lobe image 25. There is no midline shift, hydrocephalus, acute hemorrhage or acute transcortical infarct. Vascular: No hyperdense vessel or unexpected calcification. Skull: Normal. Negative for fracture or focal lesion. Sinuses/Orbits: No acute finding. Other: None. IMPRESSION: Question low-density lesion in the left frontal lobe. Further evaluation with MRI of the brain with and without contrast is recommended. Electronically Signed   By: Abelardo Diesel M.D.   On: 02/15/2018 18:53   Ct Angio Chest Pe W Or Wo Contrast  Result Date: 02/16/2018 CLINICAL DATA:  Acute onset of syncope. Elevated D-dimer. EXAM: CT ANGIOGRAPHY CHEST WITH CONTRAST TECHNIQUE: Multidetector CT imaging of the chest was performed using the standard protocol during bolus administration of intravenous contrast. Multiplanar CT image reconstructions and MIPs were obtained to evaluate the vascular anatomy. CONTRAST:  163m ISOVUE-370 IOPAMIDOL (ISOVUE-370) INJECTION 76% COMPARISON:  Chest radiograph performed 01/17/2018 FINDINGS: Cardiovascular: There is no evidence of pulmonary embolus. The thoracic aorta is unremarkable. The great vessels are within normal limits. The heart is borderline normal in size. Mediastinum/Nodes: The mediastinum is unremarkable. No mediastinal lymphadenopathy is seen. No pericardial effusion is identified. A 1.3 cm hypodensity is noted at the  right thyroid lobe, likely benign given its size. No axillary lymphadenopathy is seen. The patient's right-sided chest port is noted ending about the right cavoatrial junction. Lungs/Pleura: The lungs are essentially clear bilaterally. No focal consolidation, pleural effusion or pneumothorax is seen. No masses are identified. Upper Abdomen: The visualized portions of the liver and spleen are unremarkable. Musculoskeletal: No acute osseous abnormalities are identified. The visualized musculature is unremarkable in appearance. A 2.1 cm soft tissue nodule is noted at the 4 o'clock position of the left breast, relatively close to the nipple. Would correlate with the patient's recent treatment for breast cancer. Review of the MIP images confirms the above findings. IMPRESSION: 1. No evidence of pulmonary embolus. 2. Lungs clear bilaterally. 3. 2.1 cm soft tissue nodule at the 4 o'clock position of the left breast, relatively close to the nipple. Would correlate with the patient's recent treatment for breast cancer. Electronically Signed   By: JJacqulynn Cadet  Chang M.D.   On: 02/16/2018 01:27   Mr Jeri Cos FM Contrast  Result Date: 02/16/2018 CLINICAL DATA:  Initial evaluation for acute dizziness, history of breast cancer. EXAM: MRI HEAD WITHOUT AND WITH CONTRAST TECHNIQUE: Multiplanar, multiecho pulse sequences of the brain and surrounding structures were obtained without and with intravenous contrast. CONTRAST:  54m MULTIHANCE GADOBENATE DIMEGLUMINE 529 MG/ML IV SOLN COMPARISON:  Prior CT from 02/15/2018 as well as previous MRI from 12/26/2005. FINDINGS: Brain: Cerebral volume within normal limits for age. No significant cerebral white matter changes identified. No abnormal foci of restricted diffusion to suggest acute or subacute ischemia. Gray-white matter differentiation maintained. No evidence for chronic infarction. No acute or chronic intracranial hemorrhage. No mass lesion, midline shift or mass effect. No  hydrocephalus. No extra-axial fluid collection. No abnormal enhancement. Major dural sinuses grossly patent. Pituitary gland within normal limits. Vascular: Major intracranial vascular flow voids maintained. Skull and upper cervical spine: Craniocervical junction within normal limits. Upper cervical spine normal. No marrow replacing lesion. Scalp soft tissues unremarkable. Sinuses/Orbits: Globes and orbital soft tissues within normal limits. Paranasal sinuses largely clear. No mastoid effusion. Inner ear structures normal. Other: None. IMPRESSION: Normal brain MRI for age. No acute intracranial abnormality identified. Electronically Signed   By: BJeannine BogaM.D.   On: 02/16/2018 22:42    Microbiology: No results found for this or any previous visit (from the past 240 hour(s)).   Labs: CBC: Recent Labs  Lab 02/15/18 1806 02/16/18 0435 02/18/18 1326  WBC 3.9* 2.5* 1.9*  NEUTROABS  --   --  0.0*  HGB 12.4 11.5* 10.9*  HCT 37.4 34.2* 33.7*  MCV 93.7 92.4 94.1  PLT 176 191 1384  Basic Metabolic Panel: Recent Labs  Lab 02/15/18 1806 02/16/18 0435 02/18/18 1326  NA 137 136 139  K 3.7 3.3* 3.7  CL 97* 98* 105  CO2 _0 GLUCOSE 141* 142* 93  BUN 21* 18 10  CREATININE 1.10* 0.83 0.75  CALCIUM 8.4* 8.2* 9.2  MG 2.1  --   --   PHOS 4.6  --   --    Liver Function Tests: Recent Labs  Lab 02/16/18 0435 02/18/18 1326  AST 20 16  ALT 29 20  ALKPHOS 67 82  BILITOT 0.9 0.4  PROT 6.2* 6.7  ALBUMIN 3.4* 3.7   No results for input(s): LIPASE, AMYLASE in the last 168 hours. No results for input(s): AMMONIA in the last 168 hours. Cardiac Enzymes: Recent Labs  Lab 02/15/18 2207 02/16/18 0435 02/16/18 1030  TROPONINI <0.03 <0.03 <0.03   BNP (last 3 results) No results for input(s): BNP in the last 8760 hours. CBG: No results for input(s): GLUCAP in the last 168 hours. Time spent: 35 minutes  Signed:  PBerle Mull [facility] 02/17/2018  , 7:10 PM

## 2018-02-19 ENCOUNTER — Telehealth: Payer: Self-pay

## 2018-02-19 ENCOUNTER — Ambulatory Visit (HOSPITAL_COMMUNITY)
Admission: RE | Admit: 2018-02-19 | Discharge: 2018-02-19 | Disposition: A | Discharge status: Home / Self Care | Payer: 59 | Source: Ambulatory Visit | Attending: Oncology | Admitting: Oncology

## 2018-02-19 ENCOUNTER — Other Ambulatory Visit: Payer: Self-pay | Admitting: Adult Health

## 2018-02-19 DIAGNOSIS — Z170 Estrogen receptor positive status [ER+]: Secondary | ICD-10-CM | POA: Insufficient documentation

## 2018-02-19 DIAGNOSIS — Z95828 Presence of other vascular implants and grafts: Secondary | ICD-10-CM | POA: Insufficient documentation

## 2018-02-19 DIAGNOSIS — C50512 Malignant neoplasm of lower-outer quadrant of left female breast: Secondary | ICD-10-CM | POA: Diagnosis not present

## 2018-02-19 DIAGNOSIS — R197 Diarrhea, unspecified: Secondary | ICD-10-CM

## 2018-02-19 MED ORDER — HEPARIN SOD (PORK) LOCK FLUSH 100 UNIT/ML IV SOLN
500.0000 [IU] | INTRAVENOUS | Status: AC | PRN
  Administered 2018-02-19: 500 [IU]
  Filled 2018-02-19: qty 5

## 2018-02-19 MED ORDER — DIPHENOXYLATE-ATROPINE 2.5-0.025 MG PO TABS: 1.0000 | ORAL_TABLET | Freq: Four times a day (QID) | ORAL | 0 refills | Status: DC | PRN

## 2018-02-19 MED ORDER — SODIUM CHLORIDE 0.9 % IV SOLN
1000.0000 mL | INTRAVENOUS | Status: AC
  Administered 2018-02-19: 1000 mL via INTRAVENOUS

## 2018-02-19 MED ORDER — SODIUM CHLORIDE 0.9% FLUSH: 10.0000 mL | INTRAVENOUS | Status: DC | PRN

## 2018-02-19 MED ORDER — CHOLESTYRAMINE 4 G PO PACK
4.0000 g | PACK | Freq: Two times a day (BID) | ORAL | 1 refills | Status: DC
Start: 2018-02-19 — End: 2018-06-09

## 2018-02-19 NOTE — Discharge Instructions (Signed)
Dehydration, Adult Dehydration is when there is not enough fluid or water in your body. This happens when you lose more fluids than you take in. Dehydration can range from mild to very bad. It should be treated right away to keep it from getting very bad. Symptoms of mild dehydration may include:  Thirst.  Dry lips.  Slightly dry mouth.  Dry, warm skin.  Dizziness. Symptoms of moderate dehydration may include:  Very dry mouth.  Muscle cramps.  Dark pee (urine). Pee may be the color of tea.  Your body making less pee.  Your eyes making fewer tears.  Heartbeat that is uneven or faster than normal (palpitations).  Headache.  Light-headedness, especially when you stand up from sitting.  Fainting (syncope). Symptoms of very bad dehydration may include:  Changes in skin, such as: ? Cold and clammy skin. ? Blotchy (mottled) or pale skin. ? Skin that does not quickly return to normal after being lightly pinched and let go (poor skin turgor).  Changes in body fluids, such as: ? Feeling very thirsty. ? Your eyes making fewer tears. ? Not sweating when body temperature is high, such as in hot weather. ? Your body making very little pee.  Changes in vital signs, such as: ? Weak pulse. ? Pulse that is more than 100 beats a minute when you are sitting still. ? Fast breathing. ? Low blood pressure.  Other changes, such as: ? Sunken eyes. ? Cold hands and feet. ? Confusion. ? Lack of energy (lethargy). ? Trouble waking up from sleep. ? Short-term weight loss. ? Unconsciousness. Follow these instructions at home:  If told by your doctor, drink an ORS: ? Make an ORS by using instructions on the package. ? Start by drinking small amounts, about  cup (120 mL) every 5-10 minutes. ? Slowly drink more until you have had the amount that your doctor said to have.  Drink enough clear fluid to keep your pee clear or pale yellow. If you were told to drink an ORS, finish the ORS  first, then start slowly drinking clear fluids. Drink fluids such as: ? Water. Do not drink only water by itself. Doing that can make the salt (sodium) level in your body get too low (hyponatremia). ? Ice chips. ? Fruit juice that you have added water to (diluted). ? Low-calorie sports drinks.  Avoid: ? Alcohol. ? Drinks that have a lot of sugar. These include high-calorie sports drinks, fruit juice that does not have water added, and soda. ? Caffeine. ? Foods that are greasy or have a lot of fat or sugar.  Take over-the-counter and prescription medicines only as told by your doctor.  Do not take salt tablets. Doing that can make the salt level in your body get too high (hypernatremia).  Eat foods that have minerals (electrolytes). Examples include bananas, oranges, potatoes, tomatoes, and spinach.  Keep all follow-up visits as told by your doctor. This is important. Contact a doctor if:  You have belly (abdominal) pain that: ? Gets worse. ? Stays in one area (localizes).  You have a rash.  You have a stiff neck.  You get angry or annoyed more easily than normal (irritability).  You are more sleepy than normal.  You have a harder time waking up than normal.  You feel: ? Weak. ? Dizzy. ? Very thirsty.  You have peed (urinated) only a small amount of very dark pee during 6-8 hours. Get help right away if:  You have symptoms of   very bad dehydration.  You cannot drink fluids without throwing up (vomiting).  Your symptoms get worse with treatment.  You have a fever.  You have a very bad headache.  You are throwing up or having watery poop (diarrhea) and it: ? Gets worse. ? Does not go away.  You have blood or something green (bile) in your throw-up.  You have blood in your poop (stool). This may cause poop to look black and tarry.  You have not peed in 6-8 hours.  You pass out (faint).  Your heart rate when you are sitting still is more than 100 beats a  minute.  You have trouble breathing. This information is not intended to replace advice given to you by your health care provider. Make sure you discuss any questions you have with your health care provider. Document Released: 06/23/2009 Document Revised: 03/16/2016 Document Reviewed: 10/21/2015 Elsevier Interactive Patient Education  2018 Elsevier Inc[employer]  

## 2018-02-19 NOTE — Progress Notes (Addendum)
PATIENT CARE CENTER NOTE  Diagnosis: Dehydration    Provider: Wilber Bihari, NP   Procedure: 1 Liter 0.9% Sodium Chloride    Note: Patient received 1 liter bolus of 0.9% Sodium Chloride. Patient tolerated infusion well with no adverse reaction. Discharge instructions given to patient. Patient alert, oriented and ambulatory at discharge.

## 2018-02-19 NOTE — Progress Notes (Signed)
Pre infusion BP was 141/80, called patient's provider if its okay to go ahead with the infusion. Per provider, it's okay to go ahead.Marland Kitchen

## 2018-02-19 NOTE — Telephone Encounter (Signed)
Transition Care Management Follow-up Telephone Call   Date discharged? 02/17/18    How have you been since you were released from the hospital? "Not too well, I am going back and forth from the hospital for fluids right now and am on antibiotics".     Do you understand why you were in the hospital? Yes   Do you understand the discharge instructions? Yes   Where were you discharged to? Home   Items Reviewed:  Medications reviewed: Yes  Allergies reviewed: Yes    Dietary changes reviewed: No changes   Referrals reviewed: Yes, she is following with oncology.    Functional Questionnaire:   Activities of Daily Living (ADLs):   She states they are independent in the following: All ADLs, dressing, bathing, grooming, feeding, toileting and ambulation States they require assistance with the following: Patient does not require any assistance   Any transportation issues/concerns?: None, she is not driving but has lots of family support   Any patient concerns? None, Although it is difficult for her to make appointments with [place] as she is navigating so much with oncology right now.  She has agreed to try and come on 02/27/18 at 1130am for f/u with Dr. Glori Bickers as this is the best day that works with her schedule.    Confirmed importance and date/time of follow-up visits scheduled Yes  Provider Appointment booked with Dr. Glori Bickers for 02/27/18 at 1130am.  Confirmed with patient if condition begins to worsen call PCP or go to the ER.  Patient was given the office number and encouraged to call back with question or concerns.  : YES

## 2018-02-20 ENCOUNTER — Ambulatory Visit (HOSPITAL_COMMUNITY)
Admission: RE | Admit: 2018-02-20 | Discharge: 2018-02-20 | Disposition: A | Discharge status: Home / Self Care | Payer: 59 | Source: Ambulatory Visit | Attending: Oncology | Admitting: Oncology

## 2018-02-20 DIAGNOSIS — Z95828 Presence of other vascular implants and grafts: Secondary | ICD-10-CM | POA: Diagnosis not present

## 2018-02-20 DIAGNOSIS — C50512 Malignant neoplasm of lower-outer quadrant of left female breast: Secondary | ICD-10-CM

## 2018-02-20 DIAGNOSIS — Z170 Estrogen receptor positive status [ER+]: Principal | ICD-10-CM

## 2018-02-20 MED ORDER — SODIUM CHLORIDE 0.9% FLUSH
10.0000 mL | INTRAVENOUS | Status: AC | PRN
  Administered 2018-02-20: 10 mL

## 2018-02-20 MED ORDER — SODIUM CHLORIDE 0.9 % IV SOLN
INTRAVENOUS | Status: AC
  Administered 2018-02-20: 10:00:00 via INTRAVENOUS

## 2018-02-20 MED ORDER — HEPARIN SOD (PORK) LOCK FLUSH 100 UNIT/ML IV SOLN
500.0000 [IU] | INTRAVENOUS | Status: AC | PRN
  Administered 2018-02-20: 500 [IU]
  Filled 2018-02-20: qty 5

## 2018-02-20 NOTE — Discharge Instructions (Signed)
Today, you received an IV infusion of normal saline per MD order. Please contact your ordering provider for further instructions.

## 2018-02-20 NOTE — Progress Notes (Signed)
Procedure:  IV infusion of normal saline  Associated Diagnosis: Malignant neoplasm of lower-outer quadrant of 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, estrogen receptor positive (HCC) (C50.512 , Z17.0)  Ordering Provider: Magrinat, G C  Pt received IV infusion of normal saline; no complications noted

## 2018-02-27 ENCOUNTER — Inpatient Hospital Stay: Payer: 59 | Admitting: Family Medicine

## 2018-03-03 ENCOUNTER — Other Ambulatory Visit: Payer: 59

## 2018-03-03 ENCOUNTER — Other Ambulatory Visit: Payer: Self-pay

## 2018-03-03 ENCOUNTER — Ambulatory Visit: Payer: 59 | Admitting: Adult Health

## 2018-03-03 ENCOUNTER — Ambulatory Visit: Payer: 59

## 2018-03-03 DIAGNOSIS — C50512 Malignant neoplasm of lower-outer quadrant of left female breast: Secondary | ICD-10-CM

## 2018-03-03 DIAGNOSIS — Z170 Estrogen receptor positive status [ER+]: Principal | ICD-10-CM

## 2018-03-04 ENCOUNTER — Inpatient Hospital Stay: Payer: 59

## 2018-03-04 ENCOUNTER — Encounter: Payer: Self-pay | Admitting: *Deleted

## 2018-03-04 ENCOUNTER — Encounter: Payer: Self-pay | Admitting: Adult Health

## 2018-03-04 ENCOUNTER — Inpatient Hospital Stay (HOSPITAL_BASED_OUTPATIENT_CLINIC_OR_DEPARTMENT_OTHER): Payer: 59 | Admitting: Adult Health

## 2018-03-04 ENCOUNTER — Telehealth: Payer: Self-pay | Admitting: Adult Health

## 2018-03-04 VITALS — BP 143/79 | HR 81 | Temp 98.8°F | Resp 18 | Ht 66.5 in | Wt 192.3 lb

## 2018-03-04 DIAGNOSIS — Z95828 Presence of other vascular implants and grafts: Secondary | ICD-10-CM

## 2018-03-04 DIAGNOSIS — D509 Iron deficiency anemia, unspecified: Secondary | ICD-10-CM

## 2018-03-04 DIAGNOSIS — Z170 Estrogen receptor positive status [ER+]: Secondary | ICD-10-CM

## 2018-03-04 DIAGNOSIS — Z7689 Persons encountering health services in other specified circumstances: Secondary | ICD-10-CM

## 2018-03-04 DIAGNOSIS — C50512 Malignant neoplasm of lower-outer quadrant of left female breast: Secondary | ICD-10-CM

## 2018-03-04 DIAGNOSIS — Z5111 Encounter for antineoplastic chemotherapy: Secondary | ICD-10-CM

## 2018-03-04 DIAGNOSIS — Z79899 Other long term (current) drug therapy: Secondary | ICD-10-CM

## 2018-03-04 DIAGNOSIS — Z5112 Encounter for antineoplastic immunotherapy: Secondary | ICD-10-CM | POA: Diagnosis not present

## 2018-03-04 DIAGNOSIS — I10 Essential (primary) hypertension: Secondary | ICD-10-CM

## 2018-03-04 DIAGNOSIS — Z801 Family history of malignant neoplasm of trachea, bronchus and lung: Secondary | ICD-10-CM

## 2018-03-04 LAB — CBC WITH DIFFERENTIAL (CANCER CENTER ONLY)
BASOS ABS: 0 10*3/uL (ref 0.0–0.1)
Basophils Relative: 0 %
Eosinophils Absolute: 0 10*3/uL (ref 0.0–0.5)
Eosinophils Relative: 0 %
HEMATOCRIT: 36.1 % (ref 34.8–46.6)
Hemoglobin: 11.8 g/dL (ref 11.6–15.9)
LYMPHS PCT: 8 %
Lymphs Abs: 0.9 10*3/uL (ref 0.9–3.3)
MCH: 30.5 pg (ref 25.1–34.0)
MCHC: 32.8 g/dL (ref 31.5–36.0)
MCV: 93 fL (ref 79.5–101.0)
Monocytes Absolute: 0.5 10*3/uL (ref 0.1–0.9)
Monocytes Relative: 5 %
NEUTROS ABS: 10.2 10*3/uL — AB (ref 1.5–6.5)
Neutrophils Relative %: 87 %
PLATELETS: 294 10*3/uL (ref 145–400)
RBC: 3.88 MIL/uL (ref 3.70–5.45)
RDW: 16.1 % — ABNORMAL HIGH (ref 11.2–14.5)
WBC: 11.6 10*3/uL — AB (ref 3.9–10.3)

## 2018-03-04 LAB — CMP (CANCER CENTER ONLY)
ALK PHOS: 110 U/L (ref 38–126)
ALT: 21 U/L (ref 0–44)
AST: 20 U/L (ref 15–41)
Albumin: 3.9 g/dL (ref 3.5–5.0)
Anion gap: 9 (ref 5–15)
BUN: 12 mg/dL (ref 6–20)
CO2: 24 mmol/L (ref 22–32)
Calcium: 9.6 mg/dL (ref 8.9–10.3)
Chloride: 105 mmol/L (ref 98–111)
Creatinine: 0.83 mg/dL (ref 0.44–1.00)
GFR, Estimated: >60 mL/min (ref >60)
Glucose, Bld: 209 mg/dL — ABNORMAL HIGH (ref 70–99)
Potassium: 3.7 mmol/L (ref 3.5–5.1)
Sodium: 138 mmol/L (ref 135–145)
TOTAL PROTEIN: 7.2 g/dL (ref 6.5–8.1)
Total Bilirubin: 0.3 mg/dL (ref 0.3–1.2)

## 2018-03-04 MED ORDER — SODIUM CHLORIDE 0.9% FLUSH
10.0000 mL | Freq: Once | INTRAVENOUS | Status: AC
  Administered 2018-03-04: 10 mL
  Filled 2018-03-04: qty 10

## 2018-03-04 MED ORDER — DIPHENHYDRAMINE HCL 25 MG PO CAPS
50.0000 mg | ORAL_CAPSULE | Freq: Once | ORAL | Status: AC
  Administered 2018-03-04: 50 mg via ORAL

## 2018-03-04 MED ORDER — SODIUM CHLORIDE 0.9% FLUSH
10.0000 mL | INTRAVENOUS | Status: DC | PRN
  Administered 2018-03-04: 10 mL
  Filled 2018-03-04: qty 10

## 2018-03-04 MED ORDER — PALONOSETRON HCL INJECTION 0.25 MG/5ML
INTRAVENOUS | Status: AC
  Filled 2018-03-04: qty 5

## 2018-03-04 MED ORDER — SODIUM CHLORIDE 0.9 % IV SOLN
67.0000 mg/m2 | Freq: Once | INTRAVENOUS | Status: AC
  Administered 2018-03-04: 140 mg via INTRAVENOUS
  Filled 2018-03-04: qty 14

## 2018-03-04 MED ORDER — PEGFILGRASTIM 6 MG/0.6ML ~~LOC~~ PSKT
6.0000 mg | PREFILLED_SYRINGE | Freq: Once | SUBCUTANEOUS | Status: AC
  Administered 2018-03-04: 6 mg via SUBCUTANEOUS

## 2018-03-04 MED ORDER — TRASTUZUMAB CHEMO 150 MG IV SOLR
6.0000 mg/kg | Freq: Once | INTRAVENOUS | Status: AC
  Administered 2018-03-04: 525 mg via INTRAVENOUS
  Filled 2018-03-04: qty 25

## 2018-03-04 MED ORDER — SODIUM CHLORIDE 0.9 % IV SOLN
Freq: Once | INTRAVENOUS | Status: AC
  Administered 2018-03-04: 11:00:00 via INTRAVENOUS

## 2018-03-04 MED ORDER — SODIUM CHLORIDE 0.9 % IV SOLN
590.0000 mg | Freq: Once | INTRAVENOUS | Status: AC
  Administered 2018-03-04: 590 mg via INTRAVENOUS
  Filled 2018-03-04: qty 59

## 2018-03-04 MED ORDER — HEPARIN SOD (PORK) LOCK FLUSH 100 UNIT/ML IV SOLN
500.0000 [IU] | Freq: Once | INTRAVENOUS | Status: AC | PRN
  Administered 2018-03-04: 500 [IU]
  Filled 2018-03-04: qty 5

## 2018-03-04 MED ORDER — SODIUM CHLORIDE 0.9 % IV SOLN
Freq: Once | INTRAVENOUS | Status: AC
  Administered 2018-03-04: 13:00:00 via INTRAVENOUS
  Filled 2018-03-04: qty 5

## 2018-03-04 MED ORDER — ACETAMINOPHEN 325 MG PO TABS
ORAL_TABLET | ORAL | Status: AC
Start: 2018-03-04 — End: ?
  Filled 2018-03-04: qty 2

## 2018-03-04 MED ORDER — PEGFILGRASTIM 6 MG/0.6ML ~~LOC~~ PSKT
PREFILLED_SYRINGE | SUBCUTANEOUS | Status: AC
  Filled 2018-03-04: qty 0.6

## 2018-03-04 MED ORDER — SODIUM CHLORIDE 0.9 % IV SOLN: 670.0000 mg | Freq: Once | INTRAVENOUS | Status: DC

## 2018-03-04 MED ORDER — PALONOSETRON HCL INJECTION 0.25 MG/5ML
0.2500 mg | Freq: Once | INTRAVENOUS | Status: AC
  Administered 2018-03-04: 0.25 mg via INTRAVENOUS

## 2018-03-04 MED ORDER — DIPHENHYDRAMINE HCL 25 MG PO CAPS
ORAL_CAPSULE | ORAL | Status: AC
  Filled 2018-03-04: qty 2

## 2018-03-04 MED ORDER — ACETAMINOPHEN 325 MG PO TABS
650.0000 mg | ORAL_TABLET | Freq: Once | ORAL | Status: AC
  Administered 2018-03-04: 650 mg via ORAL

## 2018-03-04 NOTE — Telephone Encounter (Signed)
No 6/25 los/orders/referrals.

## 2018-03-04 NOTE — Patient Instructions (Signed)
[facility] Discharge Instructions for Patients Receiving Chemotherapy  Today you received the following chemotherapy agents: Herceptin, Taxotere, Carboplatin  To help prevent nausea and vomiting after your treatment, we encourage you to take your nausea medication as directed.   If you develop nausea and vomiting that is not controlled by your nausea medication, call the clinic.   BELOW ARE SYMPTOMS THAT SHOULD BE REPORTED IMMEDIATELY:  *FEVER GREATER THAN 100.5 F  *CHILLS WITH OR WITHOUT FEVER  NAUSEA AND VOMITING THAT IS NOT CONTROLLED WITH YOUR NAUSEA MEDICATION  *UNUSUAL SHORTNESS OF BREATH  *UNUSUAL BRUISING OR BLEEDING  TENDERNESS IN MOUTH AND THROAT WITH OR WITHOUT PRESENCE OF ULCERS  *URINARY PROBLEMS  *BOWEL PROBLEMS  UNUSUAL RASH Items with * indicate a potential emergency and should be followed up as soon as possible.  Feel free to call the clinic should you have any questions or concerns. The clinic phone number is [phone].  Please show the CHEMO ALERT CARD at check-in to the Emergency Department and triage nurse.   

## 2018-03-04 NOTE — Progress Notes (Signed)
[facility]  [phone] [phone]     ID: Lindsey Wright DOB: 01/15/1966  MR#: [id number]  JTT#:[id number]  Patient Care Team: [name], MD as PCP - General Magrinat, [name], MD as Consulting Physician (Oncology) Vania Rea, MD as Consulting Physician (Obstetrics and Gynecology) [name], MD as Consulting Physician (General Surgery) Kyung Rudd, MD as Consulting Physician (Radiation Oncology) OTHER MD:   CHIEF COMPLAINT: HER-2 positive breast cancer  CURRENT TREATMENT: Neoadjuvant chemotherapy  INTERVAL HISTORY: Lindsey Wright returns today for a follow-up and treatment of her HER-2 positive breast cancer. She is cycle 3 day 1 of treatment with neoadjuvant chemotherapy with Docetaxel, Carboplatin, Trastuzumab, Pertuzumab.  REVIEW OF SYSTEMS: Lindsey Wright's doing well today.  She no longer feels her breast mass.  She is claiming that this week is going to be a good week.  She denies any issues today such as peripheral neuropathy, fevers, chills, chest pain, palpitations, leg swelling, cough, nausea, vomiting, constipation, diarrhea, mucositis.    HISTORY OF CURRENT ILLNESS: From the original intake note:  "Lindsey Wright" palpated a mass in the left breast about 2 weeks ago. She followed up with her gynecologist, Dr. Jean Rosenthal who referred her to the [facility] for mammography. She underwent unilateral left diagnostic mammography with tomography and left breast ultrasonography at [facility] on 12/20/2017 showing: breast density category B. Suspicious newly palpable left breast mass at the 5 o'clock lower outer position with internal blow flow measuring 2.3 x 1.8 x 2.5 cm. There are either 2 adjacent masses or a single complicated mass at the 0:09 lower outer position, 4 cm from the nipple measuring 1.8 x 0.5 by 0.9 cm which may represent fibrocystic changes versus a solid mass. No other suspicious findings. Ultrasound showed normal axillary  nodes.  Accordingly on 12/23/2017 she proceeded to biopsy of the left breast areas in question. The pathology from this procedure showed ([id number]): At both the 5 o'clock spanning 1.2 cm and 3:30 position spanning 0.7 cm, invasive ductal carcinoma, grade III. Prognostic indicators significant for: estrogen receptor, 10% positive with weak staining intensity and progesterone receptor, 0% negative. Proliferation marker Ki67 at 90%. HER2 amplified with ratios ER2/CEP17 signals 2.33 and average HER2 copies per cell 4.65  The patient's subsequent history is as detailed below.   PAST MEDICAL HISTORY: Past Medical History:  Diagnosis Date  . Anemia    iron deficient anemia  . Breast cancer ([place])   . History of Bell's palsy     x 2 as teenager and in 20's  . Hypertension   . Menorrhagia   . Overweight(278.02)     PAST SURGICAL HISTORY: Past Surgical History:  Procedure Laterality Date  . BREAST BIOPSY Right 02/01/2015   benign  . CESAREAN SECTION CLASSICAL    . CHOLECYSTECTOMY    . COSMETIC SURGERY    . PORTACATH PLACEMENT Right 01/17/2018   Procedure: INSERTION PORT-A-CATH;  Surgeon: [name], MD;  Location: [facility];  Service: General;  Laterality: Right;  . TUBAL LIGATION      FAMILY HISTORY Family History  Problem Relation Age of Onset  . Hypertension Mother   . Diabetes Mother   . Stroke Mother   . Alcohol abuse Father   . Cancer Father        lung ca  . Hypertension Father   . Hypertension Sister   . Colon cancer Neg Hx   . Colon polyps Neg Hx   . Rectal cancer Neg  Hx   . Stomach cancer Neg Hx    The patient's father was diagnosed with lung cancer at age 18 and died the same year. The patient's mother died at age 21 due to several strokes. The patient had 1 brother who died due to strokes and possibly a MI. The patient has 1 sister. She denies a history of breast or ovarian cancer in the family.    GYNECOLOGIC HISTORY:  No LMP recorded.  (Menstrual status: Perimenopausal). Menarche: 52 years old Age at first live birth: 52 years old The patient is GXP2. The patient is not having periods, with her LMP being in 2010. She used oral contraceptive for about 10 years with no complications. She never used HRT.    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 Her husband, [name], works part time in Therapist, art.  The patient's daughter [name] age 13, is a Ship broker and starting a job. The patient's daughter [name] age 43, is a Ship broker.  Both of the patient's daughters live with her.     ADVANCED DIRECTIVES:    HEALTH MAINTENANCE: Social History   Tobacco Use  . Smoking status: Never Smoker  . Smokeless tobacco: Never Used  Substance Use Topics  . Alcohol use: No    Alcohol/week: 0.0 oz  . Drug use: No     Colonoscopy: 03/27/2017 polyp removal/ Dr. Deeann Saint  PAP: May 2017 normal  Bone density:   Allergies  Allergen Reactions  . Ace Inhibitors Cough    Current Outpatient Medications  Medication Sig Dispense Refill  . cholestyramine (QUESTRAN) 4 g packet Take 1 packet (4 g total) by mouth 2 (two) times daily. (Patient taking differently: Take 4 g by mouth 2 (two) times daily. ) 60 each 1  . dexamethasone (DECADRON) 4 MG tablet Take 2 tablets (8 mg total) by mouth 2 (two) times daily. Start the day before Taxotere. Take once the day after, then 2 times a day x 2d. 30 tablet 1  . diphenoxylate-atropine (LOMOTIL) 2.5-0.025 MG tablet Take 1 tablet by mouth 4 (four) times daily as needed for diarrhea or loose stools. 30 tablet 0  . lidocaine-prilocaine (EMLA) cream Apply to affected area once 30 g 3  . loperamide (LOPERAMIDE A-D) 2 MG tablet Take 4 mg by mouth as needed for diarrhea or loose stools.    Marland Kitchen LORazepam (ATIVAN) 0.5 MG tablet Take 1 tablet (0.5 mg total) by mouth at bedtime as needed (Nausea or vomiting). 30 tablet 0  . losartan (COZAAR) 50 MG tablet TAKE 1 TABLET (50 MG  TOTAL) BY MOUTH DAILY. (Patient taking differently: TAKE 1/2 TABLET (25 MG TOTAL) BY MOUTH DAILY.) 30 tablet 11  . ondansetron (ZOFRAN) 8 MG tablet Take 1 tablet (8 mg total) by mouth every 8 (eight) hours as needed for nausea or vomiting. 20 tablet 3  . potassium chloride SA (KLOR-CON M20) 20 MEQ tablet Take 2 tablets (40 mEq total) by mouth daily. 60 tablet 11  . prochlorperazine (COMPAZINE) 10 MG tablet Take 1 tablet (10 mg total) by mouth every 6 (six) hours as needed (Nausea or vomiting). 30 tablet 1   No current facility-administered medications for this visit.     OBJECTIVE: Vitals:   03/04/18 1032  BP: (!) 143/79  Pulse: 81  Resp: 18  Temp: 98.8 F (37.1 C)  SpO2: 98%     Body mass index is 30.57 kg/m.   Wt Readings from Last 3 Encounters:  03/04/18 192 lb 4.8  oz (87.2 kg)  02/18/18 185 lb 11.2 oz (84.2 kg)  02/17/18 184 lb 8 oz (83.7 kg)  ECOG FS:1 GENERAL: Patient is a well appearing female in no acute distress HEENT:  Sclerae anicteric.  Oropharynx clear and moist. No ulcerations or evidence of oropharyngeal candidiasis. Neck is supple.  NODES:  No cervical, supraclavicular, or axillary lymphadenopathy palpated.  BREAST EXAM:  Deferred. LUNGS:  Clear to auscultation bilaterally.  No wheezes or rhonchi. HEART:  Regular rate and rhythm. No murmur appreciated. ABDOMEN:  Soft, nontender.  Positive, normoactive bowel sounds. No organomegaly palpated. MSK:  No focal spinal tenderness to palpation. Full range of motion bilaterally in the upper extremities. EXTREMITIES:  No peripheral edema.   SKIN:  Clear with no obvious rashes or skin changes. No nail dyscrasia. NEURO:  Nonfocal. Well oriented.  Appropriate affect.    LAB RESULTS:  CMP     Component Value Date/Time   NA 139 02/18/2018 1326   K 3.7 02/18/2018 1326   CL 105 02/18/2018 1326   CO2 29 02/18/2018 1326   GLUCOSE 93 02/18/2018 1326   BUN 10 02/18/2018 1326   CREATININE 0.75 02/18/2018 1326   CALCIUM 9.2  02/18/2018 1326   PROT 6.7 02/18/2018 1326   ALBUMIN 3.7 02/18/2018 1326   AST 16 02/18/2018 1326   ALT 20 02/18/2018 1326   ALKPHOS 82 02/18/2018 1326   BILITOT 0.4 02/18/2018 1326   GFRNONAA >60 02/18/2018 1326   GFRAA >60 02/18/2018 1326    No results found for: TOTALPROTELP, ALBUMINELP, A1GS, A2GS, BETS, BETA2SER, GAMS, MSPIKE, SPEI  No results found for: KPAFRELGTCHN, LAMBDASER, KAPLAMBRATIO  Lab Results  Component Value Date   WBC 11.6 (H) 03/04/2018   NEUTROABS 10.2 (H) 03/04/2018   HGB 11.8 03/04/2018   HCT 36.1 03/04/2018   MCV 93.0 03/04/2018   PLT 294 03/04/2018    '@LASTCHEMISTRY' @  No results found for: LABCA2  No components found for: ZWCHEN277  No results for input(s): INR in the last 168 hours.  No results found for: LABCA2  No results found for: OEU235  No results found for: TIR443  No results found for: XVQ008  No results found for: CA2729  No components found for: HGQUANT  No results found for: CEA1 / No results found for: CEA1   No results found for: AFPTUMOR  No results found for: CHROMOGRNA  No results found for: PSA1  Appointment on 03/04/2018  Component Date Value Ref Range Status  . WBC Count 03/04/2018 11.6* 3.9 - 10.3 K/uL Final  . RBC 03/04/2018 3.88  3.70 - 5.45 MIL/uL Final  . Hemoglobin 03/04/2018 11.8  11.6 - 15.9 g/dL Final  . HCT 03/04/2018 36.1  34.8 - 46.6 % Final  . MCV 03/04/2018 93.0  79.5 - 101.0 fL Final  . MCH 03/04/2018 30.5  25.1 - 34.0 pg Final  . MCHC 03/04/2018 32.8  31.5 - 36.0 g/dL Final  . RDW 03/04/2018 16.1* 11.2 - 14.5 % Final  . Platelet Count 03/04/2018 294  145 - 400 K/uL Final  . Neutrophils Relative % 03/04/2018 87  % Final  . Neutro Abs 03/04/2018 10.2* 1.5 - 6.5 K/uL Final  . Lymphocytes Relative 03/04/2018 8  % Final  . Lymphs Abs 03/04/2018 0.9  0.9 - 3.3 K/uL Final  . Monocytes Relative 03/04/2018 5  % Final  . Monocytes Absolute 03/04/2018 0.5  0.1 - 0.9 K/uL Final  . Eosinophils  Relative 03/04/2018 0  % Final  . Eosinophils Absolute 03/04/2018  0.0  0.0 - 0.5 K/uL Final  . Basophils Relative 03/04/2018 0  % Final  . Basophils Absolute 03/04/2018 0.0  0.0 - 0.1 K/uL Final   Performed at [facility], [address] [place], [place] [address]    (this displays the last labs from the last 3 days)  No results found for: TOTALPROTELP, ALBUMINELP, A1GS, A2GS, BETS, BETA2SER, GAMS, MSPIKE, SPEI (this displays SPEP labs)  No results found for: KPAFRELGTCHN, LAMBDASER, KAPLAMBRATIO (kappa/lambda light chains)  No results found for: HGBA, HGBA2QUANT, HGBFQUANT, HGBSQUAN (Hemoglobinopathy evaluation)   No results found for: LDH  Lab Results  Component Value Date   IRON 104 09/27/2008   IRONPCTSAT 25.1 09/27/2008   (Iron and TIBC)  No results found for: FERRITIN  Urinalysis    Component Value Date/Time   COLORURINE YELLOW 02/15/2018 2014   APPEARANCEUR CLEAR 02/15/2018 2014   LABSPEC 1.011 02/15/2018 2014   PHURINE 7.0 02/15/2018 2014   [place] 02/15/2018 2014   [place] (A) 02/15/2018 2014   [place] NEGATIVE 02/15/2018 2014   [place] NEGATIVE 02/15/2018 2014   [place] NEGATIVE 02/15/2018 2014   [place] NEGATIVE 02/15/2018 2014   LEUKOCYTESUR NEGATIVE 02/15/2018 2014     STUDIES: Ct Head Wo Contrast  Result Date: 02/15/2018 CLINICAL DATA:  Near syncope today.  History of breast cancer EXAM: CT HEAD WITHOUT CONTRAST TECHNIQUE: Contiguous axial images were obtained from the base of the skull through the vertex without intravenous contrast. COMPARISON:  MR brain December 26, 2005 FINDINGS: Brain: There is question low-density lesion in the left frontal lobe image 25. There is no midline shift, hydrocephalus, acute hemorrhage or acute transcortical infarct. Vascular: No hyperdense vessel or unexpected calcification. Skull: Normal. Negative for fracture or focal lesion. Sinuses/Orbits: No acute finding. Other: None.  IMPRESSION: Question low-density lesion in the left frontal lobe. Further evaluation with MRI of the brain with and without contrast is recommended. Electronically Signed   By: Abelardo Diesel M.D.   On: 02/15/2018 18:53   Ct Angio Chest Pe W Or Wo Contrast  Result Date: 02/16/2018 CLINICAL DATA:  Acute onset of syncope. Elevated D-dimer. EXAM: CT ANGIOGRAPHY CHEST WITH CONTRAST TECHNIQUE: Multidetector CT imaging of the chest was performed using the standard protocol during bolus administration of intravenous contrast. Multiplanar CT image reconstructions and MIPs were obtained to evaluate the vascular anatomy. CONTRAST:  145m ISOVUE-370 IOPAMIDOL (ISOVUE-370) INJECTION 76% COMPARISON:  Chest radiograph performed 01/17/2018 FINDINGS: Cardiovascular: There is no evidence of pulmonary embolus. The thoracic aorta is unremarkable. The great vessels are within normal limits. The heart is borderline normal in size. Mediastinum/Nodes: The mediastinum is unremarkable. No mediastinal lymphadenopathy is seen. No pericardial effusion is identified. A 1.3 cm hypodensity is noted at the right thyroid lobe, likely benign given its size. No axillary lymphadenopathy is seen. The patient's right-sided chest port is noted ending about the right cavoatrial junction. Lungs/Pleura: The lungs are essentially clear bilaterally. No focal consolidation, pleural effusion or pneumothorax is seen. No masses are identified. Upper Abdomen: The visualized portions of the liver and spleen are unremarkable. Musculoskeletal: No acute osseous abnormalities are identified. The visualized musculature is unremarkable in appearance. A 2.1 cm soft tissue nodule is noted at the 4 o'clock position of the left breast, relatively close to the nipple. Would correlate with the patient's recent treatment for breast cancer. Review of the MIP images confirms the above findings. IMPRESSION: 1. No evidence of pulmonary embolus. 2. Lungs clear bilaterally. 3. 2.1  cm soft  tissue nodule at the 4 o'clock position of the left breast, relatively close to the nipple. Would correlate with the patient's recent treatment for breast cancer. Electronically Signed   By: Garald Balding M.D.   On: 02/16/2018 01:27   Mr Jeri Cos DE Contrast  Result Date: 02/16/2018 CLINICAL DATA:  Initial evaluation for acute dizziness, history of breast cancer. EXAM: MRI HEAD WITHOUT AND WITH CONTRAST TECHNIQUE: Multiplanar, multiecho pulse sequences of the brain and surrounding structures were obtained without and with intravenous contrast. CONTRAST:  32m MULTIHANCE GADOBENATE DIMEGLUMINE 529 MG/ML IV SOLN COMPARISON:  Prior CT from 02/15/2018 as well as previous MRI from 12/26/2005. FINDINGS: Brain: Cerebral volume within normal limits for age. No significant cerebral white matter changes identified. No abnormal foci of restricted diffusion to suggest acute or subacute ischemia. Gray-white matter differentiation maintained. No evidence for chronic infarction. No acute or chronic intracranial hemorrhage. No mass lesion, midline shift or mass effect. No hydrocephalus. No extra-axial fluid collection. No abnormal enhancement. Major dural sinuses grossly patent. Pituitary gland within normal limits. Vascular: Major intracranial vascular flow voids maintained. Skull and upper cervical spine: Craniocervical junction within normal limits. Upper cervical spine normal. No marrow replacing lesion. Scalp soft tissues unremarkable. Sinuses/Orbits: Globes and orbital soft tissues within normal limits. Paranasal sinuses largely clear. No mastoid effusion. Inner ear structures normal. Other: None. IMPRESSION: Normal brain MRI for age. No acute intracranial abnormality identified. Electronically Signed   By: BJeannine BogaM.D.   On: 02/16/2018 22:42    ELIGIBLE FOR AVAILABLE RESEARCH PROTOCOL: Exact signs of study  ASSESSMENT: 52y.o.  [place] status post left breast lower outer quadrant  biopsy 12/23/2017 for a clinically multifocal T2 N0, stage II invasive ductal carcinoma, grade 3, essentially estrogen receptor and progesterone receptor negative, but HER-2 amplified, with an MIB-1 of 90%.  (a) breast MRI 01/07/2018 shows the 2.6 cm main mass and 4 additional smaller masses spanning 7.1 cm; there was a suspicious left axillary lymph node  (b) left axillary lymph node biopsy 01/14/2018 was benign/discordant (no lymph node tissue)  (1) neoadjuvant chemotherapy will consist of carboplatin and docetaxel every 21 days for 6 cycles starting 01/20/2018  (2) anti-HER-2 immunotherapy will consist of trastuzumab and Pertuzumab, to be continued for 6 months, starting concurrently with chemotherapy (pertuzumab discontinued after cycle 2 due to diarrhea)  (a) baseline echocardiogram 01/08/2018 shows an ejection fraction in the 60-65% range  (3) definitive surgery pending  (4) adjuvant radiation as appropriate  (5) consider antiestrogens given the weak receptor positivity  PLAN:  Lindsey Wright is doing well today.  I reviewed her labs which have rebounded, so she will proceed with her third cycle of Docetaxel, Carboplatin, Trastuzumab today.  I reviewed her chemo orders and verified that Pertuzumab has been deleted, and there is a 10% reduction in her Docetaxel due to her neutropenia and tolerance after cycle 2.    Lindsey Wright and I reviewed her anti emetic regimen in detail.  She will return next week for labs, f/u with Dr. MJana Hakim  I have requested that an IV fluid appointment be added afterwards.    Lindsey Wright knows to call for any questions or concerns prior to her next appointment with [place]    A total of (30) minutes of face-to-face time was spent with this patient with greater than 50% of that time in counseling and care-coordination.   LWilber Bihari NP  03/04/18 12:13 PM Medical Oncology and Hematology [facility]  [place] [address] Tel.  [phone]    Fax. [phone]

## 2018-03-04 NOTE — Progress Notes (Signed)
Leave carboplatin dose at 590 mg today due to patient tolerance per V.O. Dr. Jana Hakim.

## 2018-03-05 ENCOUNTER — Ambulatory Visit: Payer: 59

## 2018-03-06 ENCOUNTER — Inpatient Hospital Stay: Payer: 59

## 2018-03-07 ENCOUNTER — Ambulatory Visit: Payer: 59

## 2018-03-10 ENCOUNTER — Other Ambulatory Visit: Payer: Self-pay

## 2018-03-10 DIAGNOSIS — C50512 Malignant neoplasm of lower-outer quadrant of left female breast: Secondary | ICD-10-CM

## 2018-03-10 DIAGNOSIS — Z170 Estrogen receptor positive status [ER+]: Principal | ICD-10-CM

## 2018-03-10 NOTE — Progress Notes (Signed)
[place]  [phone] [phone]     ID: Lindsey Wright DOB: 04/22/1966  MR#: [id number]  HTD#:[id number]  Patient Care Team: [name], MD as PCP - General Safire Gordin, [name], MD as Consulting Physician (Oncology) Vania Rea, MD as Consulting Physician (Obstetrics and Gynecology) [name], MD as Consulting Physician (General Surgery) Kyung Rudd, MD as Consulting Physician (Radiation Oncology) OTHER MD:   CHIEF COMPLAINT: HER-2 positive breast cancer  CURRENT TREATMENT: Neoadjuvant chemotherapy  INTERVAL HISTORY: Lindsey Wright returns today for a follow-up and treatment of her HER-2 positive breast cancer accompanied by her daughter, Lindsey Wright. Today is day 8 cycle 3 of 6 planned cycles of carboplatin, docetaxel, trastuzumab, and pertuzumab, given 21 days apart. She tolerated this well. She notes that she had severe back pain one night, but she aided this with tylenol. She was a little fatigued, and she rested this weekend. She had some nausea, but she denies vomiting. She also had some diarrhea, and she aided this with Questran. She notes some discoloration on her fingernails. She denies orals sores.    REVIEW OF SYSTEMS: Lindsey Wright reports that she is doing well and she is excited to be halfway through chemotherapy treatments. She denies unusual headaches, visual changes, nausea, vomiting, or dizziness. There has been no unusual cough, phlegm production, or pleurisy. This been no change in bowel or bladder habits. She denies unexplained fatigue or unexplained weight loss, bleeding, rash, or fever. A detailed review of systems was otherwise stable.   HISTORY OF CURRENT ILLNESS: From the original intake note:  "Lindsey Wright" palpated a mass in the left breast about 2 weeks ago. She followed up with her gynecologist, Dr. Jean Rosenthal who referred her to the [facility] for mammography. She underwent unilateral left diagnostic mammography with tomography  and left breast ultrasonography at [facility] on 12/20/2017 showing: breast density category B. Suspicious newly palpable left breast mass at the 5 o'clock lower outer position with internal blow flow measuring 2.3 x 1.8 x 2.5 cm. There are either 2 adjacent masses or a single complicated mass at the 7:26 lower outer position, 4 cm from the nipple measuring 1.8 x 0.5 by 0.9 cm which may represent fibrocystic changes versus a solid mass. No other suspicious findings. Ultrasound showed normal axillary nodes.  Accordingly on 12/23/2017 she proceeded to biopsy of the left breast areas in question. The pathology from this procedure showed ([id number]): At both the 5 o'clock spanning 1.2 cm and 3:30 position spanning 0.7 cm, invasive ductal carcinoma, grade III. Prognostic indicators significant for: estrogen receptor, 10% positive with weak staining intensity and progesterone receptor, 0% negative. Proliferation marker Ki67 at 90%. HER2 amplified with ratios ER2/CEP17 signals 2.33 and average HER2 copies per cell 4.65  The patient's subsequent history is as detailed below.   PAST MEDICAL HISTORY: Past Medical History:  Diagnosis Date  . Anemia    iron deficient anemia  . Breast cancer ([place])   . History of Bell's palsy     x 2 as teenager and in 20's  . Hypertension   . Menorrhagia   . Overweight(278.02)     PAST SURGICAL HISTORY: Past Surgical History:  Procedure Laterality Date  . BREAST BIOPSY Right 02/01/2015   benign  . CESAREAN SECTION CLASSICAL    . CHOLECYSTECTOMY    . COSMETIC SURGERY    . PORTACATH PLACEMENT Right 01/17/2018   Procedure: INSERTION PORT-A-CATH;  Surgeon: [name], MD;  Location: [facility];  Service: General;  Laterality: Right;  . TUBAL LIGATION      FAMILY HISTORY Family History  Problem Relation Age of Onset  . Hypertension Mother   . Diabetes Mother   . Stroke Mother   . Alcohol abuse Father   . Cancer Father        lung ca    . Hypertension Father   . Hypertension Sister   . Colon cancer Neg Hx   . Colon polyps Neg Hx   . Rectal cancer Neg Hx   . Stomach cancer Neg Hx    The patient's father was diagnosed with lung cancer at age 70 and died the same year. The patient's mother died at age 43 due to several strokes. The patient had 1 brother who died due to strokes and possibly a MI. The patient has 1 sister. She denies a history of breast or ovarian cancer in the family.    GYNECOLOGIC HISTORY:  No LMP recorded. (Menstrual status: Perimenopausal). Menarche: 52 years old Age at first live birth: 52 years old The patient is GXP2. The patient is not having periods, with her LMP being in 2010. She used oral contraceptive for about 10 years with no complications. She never used HRT.    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 Her husband, Lindsey Wright, works part time in Therapist, art.  The patient's daughter Lindsey Wright age 77, is a Ship broker and starting a job. The patient's daughter Lindsey Wright age 6, is a Ship broker.  Both of the patient's daughters live with her.     ADVANCED DIRECTIVES:    HEALTH MAINTENANCE: Social History   Tobacco Use  . Smoking status: Never Smoker  . Smokeless tobacco: Never Used  Substance Use Topics  . Alcohol use: No    Alcohol/week: 0.0 oz  . Drug use: No     Colonoscopy: 03/27/2017 polyp removal/ Dr. Deeann Saint  PAP: May 2017 normal  Bone density:   Allergies  Allergen Reactions  . Ace Inhibitors Cough    Current Outpatient Medications  Medication Sig Dispense Refill  . cholestyramine (QUESTRAN) 4 g packet Take 1 packet (4 g total) by mouth 2 (two) times daily. (Patient taking differently: Take 4 g by mouth 2 (two) times daily. ) 60 each 1  . dexamethasone (DECADRON) 4 MG tablet Take 2 tablets (8 mg total) by mouth 2 (two) times daily. Start the day before Taxotere. Take once the day after, then 2 times a day x 2d. 30 tablet 1  .  diphenoxylate-atropine (LOMOTIL) 2.5-0.025 MG tablet Take 1 tablet by mouth 4 (four) times daily as needed for diarrhea or loose stools. 30 tablet 0  . lidocaine-prilocaine (EMLA) cream Apply to affected area once 30 g 3  . loperamide (LOPERAMIDE A-D) 2 MG tablet Take 4 mg by mouth as needed for diarrhea or loose stools.    Marland Kitchen LORazepam (ATIVAN) 0.5 MG tablet Take 1 tablet (0.5 mg total) by mouth at bedtime as needed (Nausea or vomiting). 30 tablet 0  . losartan (COZAAR) 50 MG tablet TAKE 1 TABLET (50 MG TOTAL) BY MOUTH DAILY. (Patient taking differently: TAKE 1/2 TABLET (25 MG TOTAL) BY MOUTH DAILY.) 30 tablet 11  . ondansetron (ZOFRAN) 8 MG tablet Take 1 tablet (8 mg total) by mouth every 8 (eight) hours as needed for nausea or vomiting. 20 tablet 3  . potassium chloride SA (KLOR-CON M20) 20 MEQ tablet Take 2 tablets (40 mEq total) by mouth daily. 60 tablet 11  .  prochlorperazine (COMPAZINE) 10 MG tablet Take 1 tablet (10 mg total) by mouth every 6 (six) hours as needed (Nausea or vomiting). 30 tablet 1   No current facility-administered medications for this visit.     OBJECTIVE: Middle-aged African-American woman who appears stated age  52:   03/11/18 1411  BP: (!) 144/92  Pulse: 92  Resp: 17  Temp: 98.9 F (37.2 C)  SpO2: 99%     Body mass index is 29.48 kg/m.   Wt Readings from Last 3 Encounters:  03/11/18 185 lb 6.4 oz (84.1 kg)  03/04/18 192 lb 4.8 oz (87.2 kg)  02/18/18 185 lb 11.2 oz (84.2 kg)  ECOG FS:1  Sclerae unicteric, EOMs intact Oropharynx clear and moist No cervical or supraclavicular adenopathy Lungs no rales or rhonchi Heart regular rate and rhythm Abd soft, nontender, positive bowel sounds MSK no focal spinal tenderness, no upper extremity lymphedema Neuro: nonfocal, well oriented, appropriate affect Breasts: The right breast is unremarkable.  I no longer feel a mass in the left breast.  Both axillae are benign.   LAB RESULTS:  CMP     Component  Value Date/Time   NA 136 03/11/2018 1325   K 3.4 (L) 03/11/2018 1325   CL 101 03/11/2018 1325   CO2 28 03/11/2018 1325   GLUCOSE 129 (H) 03/11/2018 1325   BUN 14 03/11/2018 1325   CREATININE 0.91 03/11/2018 1325   CALCIUM 9.6 03/11/2018 1325   PROT 6.9 03/11/2018 1325   ALBUMIN 3.9 03/11/2018 1325   AST 20 03/11/2018 1325   ALT 17 03/11/2018 1325   ALKPHOS 141 (H) 03/11/2018 1325   BILITOT 0.3 03/11/2018 1325   GFRNONAA >60 03/11/2018 1325   GFRAA >60 03/11/2018 1325    No results found for: TOTALPROTELP, ALBUMINELP, A1GS, A2GS, BETS, BETA2SER, GAMS, MSPIKE, SPEI  No results found for: KPAFRELGTCHN, LAMBDASER, KAPLAMBRATIO  Lab Results  Component Value Date   WBC 33.3 (H) 03/11/2018   NEUTROABS 26.5 (H) 03/11/2018   HGB 12.3 03/11/2018   HCT 37.5 03/11/2018   MCV 94.2 03/11/2018   PLT 163 03/11/2018    '@LASTCHEMISTRY'$ @  No results found for: LABCA2  No components found for: KAJGOT157  No results for input(s): INR in the last 168 hours.  No results found for: LABCA2  No results found for: WIO035  No results found for: DHR416  No results found for: LAG536  No results found for: CA2729  No components found for: HGQUANT  No results found for: CEA1 / No results found for: CEA1   No results found for: AFPTUMOR  No results found for: CHROMOGRNA  No results found for: PSA1  Appointment on 03/11/2018  Component Date Value Ref Range Status  . WBC Count 03/11/2018 33.3* 3.9 - 10.3 K/uL Final  . RBC 03/11/2018 3.98  3.70 - 5.45 MIL/uL Final  . Hemoglobin 03/11/2018 12.3  11.6 - 15.9 g/dL Final  . HCT 03/11/2018 37.5  34.8 - 46.6 % Final  . MCV 03/11/2018 94.2  79.5 - 101.0 fL Final  . MCH 03/11/2018 30.9  25.1 - 34.0 pg Final  . MCHC 03/11/2018 32.8  31.5 - 36.0 g/dL Final  . RDW 03/11/2018 15.9* 11.2 - 14.5 % Final  . Platelet Count 03/11/2018 163  145 - 400 K/uL Final  . Neutrophils Relative % 03/11/2018 80  % Final  . Neutro Abs 03/11/2018 26.5* 1.5 -  6.5 K/uL Final  . Lymphocytes Relative 03/11/2018 10  % Final  . Lymphs Abs 03/11/2018 3.4*  0.9 - 3.3 K/uL Final  . Monocytes Relative 03/11/2018 10  % Final  . Monocytes Absolute 03/11/2018 3.2* 0.1 - 0.9 K/uL Final  . Eosinophils Relative 03/11/2018 0  % Final  . Eosinophils Absolute 03/11/2018 0.0  0.0 - 0.5 K/uL Final  . Basophils Relative 03/11/2018 0  % Final  . Basophils Absolute 03/11/2018 0.1  0.0 - 0.1 K/uL Final  . nRBC 03/11/2018 1* 0 /100 WBC Final   Performed at [facility], [place] [address], [place], [place] [address]  . Sodium 03/11/2018 136  135 - 145 mmol/L Final   Please note reference intervals were recently updated.  . Potassium 03/11/2018 3.4* 3.5 - 5.1 mmol/L Final  . Chloride 03/11/2018 101  98 - 111 mmol/L Final  . CO2 03/11/2018 28  22 - 32 mmol/L Final  . Glucose, Bld 03/11/2018 129* 70 - 99 mg/dL Final  . BUN 03/11/2018 14  6 - 20 mg/dL Final   Please note change in reference range.  . Creatinine 03/11/2018 0.91  0.44 - 1.00 mg/dL Final  . Calcium 03/11/2018 9.6  8.9 - 10.3 mg/dL Final  . Total Protein 03/11/2018 6.9  6.5 - 8.1 g/dL Final  . Albumin 03/11/2018 3.9  3.5 - 5.0 g/dL Final  . AST 03/11/2018 20  15 - 41 U/L Final  . ALT 03/11/2018 17  0 - 44 U/L Final  . Alkaline Phosphatase 03/11/2018 141* 38 - 126 U/L Final  . Total Bilirubin 03/11/2018 0.3  0.3 - 1.2 mg/dL Final  . GFR, Est Non Af Am 03/11/2018 >60  >60 mL/min Final  . GFR, Est AFR Am 03/11/2018 >60  >60 mL/min Final   Comment: (NOTE) The eGFR has been calculated using the CKD EPI equation. This calculation has not been validated in all clinical situations. eGFR's persistently <60 mL/min signify possible Chronic Kidney Disease.   Georgiann Hahn gap 03/11/2018 7  5 - 15 Final   Performed at [facility], [address] [place], [place] [address]    (this displays the last labs from the last 3 days)  No results found for: TOTALPROTELP,  ALBUMINELP, A1GS, A2GS, BETS, BETA2SER, GAMS, MSPIKE, SPEI (this displays SPEP labs)  No results found for: KPAFRELGTCHN, LAMBDASER, KAPLAMBRATIO (kappa/lambda light chains)  No results found for: HGBA, HGBA2QUANT, HGBFQUANT, HGBSQUAN (Hemoglobinopathy evaluation)   No results found for: LDH  Lab Results  Component Value Date   IRON 104 09/27/2008   IRONPCTSAT 25.1 09/27/2008   (Iron and TIBC)  No results found for: FERRITIN  Urinalysis    Component Value Date/Time   COLORURINE YELLOW 02/15/2018 2014   APPEARANCEUR CLEAR 02/15/2018 2014   LABSPEC 1.011 02/15/2018 2014   PHURINE 7.0 02/15/2018 2014   [place] 02/15/2018 2014   [place] (A) 02/15/2018 2014   [place] NEGATIVE 02/15/2018 2014   [place] NEGATIVE 02/15/2018 2014   [place] NEGATIVE 02/15/2018 2014   N[place] NEGATIVE 02/15/2018 2014   LEUKOCYTESUR NEGATIVE 02/15/2018 2014     STUDIES: Ct Head Wo Contrast  Result Date: 02/15/2018 CLINICAL DATA:  Near syncope today.  History of breast cancer EXAM: CT HEAD WITHOUT CONTRAST TECHNIQUE: Contiguous axial images were obtained from the base of the skull through the vertex without intravenous contrast. COMPARISON:  MR brain December 26, 2005 FINDINGS: Brain: There is question Wright-density lesion in the left frontal lobe image 25. There is no midline shift, hydrocephalus, acute hemorrhage or acute transcortical infarct. Vascular: No hyperdense vessel or unexpected  calcification. Skull: Normal. Negative for fracture or focal lesion. Sinuses/Orbits: No acute finding. Other: None. IMPRESSION: Question Wright-density lesion in the left frontal lobe. Further evaluation with MRI of the brain with and without contrast is recommended. Electronically Signed   By: Abelardo Diesel M.D.   On: 02/15/2018 18:53   Ct Angio Chest Pe W Or Wo Contrast  Result Date: 02/16/2018 CLINICAL DATA:  Acute onset of syncope. Elevated D-dimer. EXAM: CT ANGIOGRAPHY CHEST WITH CONTRAST TECHNIQUE:  Multidetector CT imaging of the chest was performed using the standard protocol during bolus administration of intravenous contrast. Multiplanar CT image reconstructions and MIPs were obtained to evaluate the vascular anatomy. CONTRAST:  117m ISOVUE-370 IOPAMIDOL (ISOVUE-370) INJECTION 76% COMPARISON:  Chest radiograph performed 01/17/2018 FINDINGS: Cardiovascular: There is no evidence of pulmonary embolus. The thoracic aorta is unremarkable. The great vessels are within normal limits. The heart is borderline normal in size. Mediastinum/Nodes: The mediastinum is unremarkable. No mediastinal lymphadenopathy is seen. No pericardial effusion is identified. A 1.3 cm hypodensity is noted at the right thyroid lobe, likely benign given its size. No axillary lymphadenopathy is seen. The patient's right-sided chest port is noted ending about the right cavoatrial junction. Lungs/Pleura: The lungs are essentially clear bilaterally. No focal consolidation, pleural effusion or pneumothorax is seen. No masses are identified. Upper Abdomen: The visualized portions of the liver and spleen are unremarkable. Musculoskeletal: No acute osseous abnormalities are identified. The visualized musculature is unremarkable in appearance. A 2.1 cm soft tissue nodule is noted at the 4 o'clock position of the left breast, relatively close to the nipple. Would correlate with the patient's recent treatment for breast cancer. Review of the MIP images confirms the above findings. IMPRESSION: 1. No evidence of pulmonary embolus. 2. Lungs clear bilaterally. 3. 2.1 cm soft tissue nodule at the 4 o'clock position of the left breast, relatively close to the nipple. Would correlate with the patient's recent treatment for breast cancer. Electronically Signed   By: JGarald BaldingM.D.   On: 02/16/2018 01:27   Mr BJeri CosWGUContrast  Result Date: 02/16/2018 CLINICAL DATA:  Initial evaluation for acute dizziness, history of breast cancer. EXAM: MRI HEAD  WITHOUT AND WITH CONTRAST TECHNIQUE: Multiplanar, multiecho pulse sequences of the brain and surrounding structures were obtained without and with intravenous contrast. CONTRAST:  128mMULTIHANCE GADOBENATE DIMEGLUMINE 529 MG/ML IV SOLN COMPARISON:  Prior CT from 02/15/2018 as well as previous MRI from 12/26/2005. FINDINGS: Brain: Cerebral volume within normal limits for age. No significant cerebral white matter changes identified. No abnormal foci of restricted diffusion to suggest acute or subacute ischemia. Gray-white matter differentiation maintained. No evidence for chronic infarction. No acute or chronic intracranial hemorrhage. No mass lesion, midline shift or mass effect. No hydrocephalus. No extra-axial fluid collection. No abnormal enhancement. Major dural sinuses grossly patent. Pituitary gland within normal limits. Vascular: Major intracranial vascular flow voids maintained. Skull and upper cervical spine: Craniocervical junction within normal limits. Upper cervical spine normal. No marrow replacing lesion. Scalp soft tissues unremarkable. Sinuses/Orbits: Globes and orbital soft tissues within normal limits. Paranasal sinuses largely clear. No mastoid effusion. Inner ear structures normal. Other: None. IMPRESSION: Normal brain MRI for age. No acute intracranial abnormality identified. Electronically Signed   By: BeJeannine Boga.D.   On: 02/16/2018 22:42    ELIGIBLE FOR AVAILABLE RESEARCH PROTOCOL: Exact signs of study  ASSESSMENT: 5124.o.  [place], [place] status post left breast lower outer quadrant biopsy 12/23/2017 for a clinically multifocal T2 N0, stage  II invasive ductal carcinoma, grade 3, essentially estrogen receptor and progesterone receptor negative, but HER-2 amplified, with an MIB-1 of 90%.  (a) breast MRI 01/07/2018 shows the 2.6 cm main mass and 4 additional smaller masses spanning 7.1 cm; there was a suspicious left axillary lymph node  (b) left axillary lymph node  biopsy 01/14/2018 was benign/discordant (no lymph node tissue)  (1) neoadjuvant chemotherapy will consist of carboplatin and docetaxel every 21 days for 6 cycles starting 01/20/2018  (2) anti-HER-2 immunotherapy will consist of trastuzumab and Pertuzumab, to be continued for 6 months, starting concurrently with chemotherapy (pertuzumab discontinued after cycle 2 due to diarrhea)  (a) baseline echocardiogram 01/08/2018 shows an ejection fraction in the 60-65% range  (3) definitive surgery pending.gcmend   (4) adjuvant radiation as appropriate  (5) consider antiestrogens given the weak receptor positivity  PLAN:  Lindsey Wright is tolerating her chemotherapy well and we are already seeing good evidence of response in that her palpable mass is no longer palpable.  She will return on 03/24/2018 for her fourth cycle and then I will see her a week later.  She will have cycle #5 on 8 /5 and cycle #6 on 8/26.  After that she will have a repeat breast MRI and proceed to surgery.  I am hopeful we will achieve a complete pathologic response  She knows to call for any problems that may develop before the next visit.   Wilber Bihari, NP  03/11/18 2:27 PM Medical Oncology and Hematology [facility] [address] [place], [place] [address] Tel. [phone]    Fax. [phone]

## 2018-03-11 ENCOUNTER — Inpatient Hospital Stay: Payer: 59

## 2018-03-11 ENCOUNTER — Telehealth: Payer: Self-pay | Admitting: Oncology

## 2018-03-11 ENCOUNTER — Other Ambulatory Visit: Payer: 59

## 2018-03-11 ENCOUNTER — Inpatient Hospital Stay: Payer: 59 | Attending: Oncology | Admitting: Oncology

## 2018-03-11 VITALS — BP 144/92 | HR 92 | Temp 98.9°F | Resp 17 | Ht 66.5 in | Wt 185.4 lb

## 2018-03-11 DIAGNOSIS — D509 Iron deficiency anemia, unspecified: Secondary | ICD-10-CM

## 2018-03-11 DIAGNOSIS — Z95828 Presence of other vascular implants and grafts: Secondary | ICD-10-CM

## 2018-03-11 DIAGNOSIS — Z79899 Other long term (current) drug therapy: Secondary | ICD-10-CM | POA: Diagnosis not present

## 2018-03-11 DIAGNOSIS — Z170 Estrogen receptor positive status [ER+]: Secondary | ICD-10-CM | POA: Diagnosis not present

## 2018-03-11 DIAGNOSIS — I10 Essential (primary) hypertension: Secondary | ICD-10-CM | POA: Diagnosis not present

## 2018-03-11 DIAGNOSIS — C50512 Malignant neoplasm of lower-outer quadrant of left female breast: Secondary | ICD-10-CM

## 2018-03-11 DIAGNOSIS — R5383 Other fatigue: Secondary | ICD-10-CM | POA: Diagnosis not present

## 2018-03-11 DIAGNOSIS — Z5111 Encounter for antineoplastic chemotherapy: Secondary | ICD-10-CM | POA: Diagnosis not present

## 2018-03-11 DIAGNOSIS — Z5112 Encounter for antineoplastic immunotherapy: Secondary | ICD-10-CM | POA: Insufficient documentation

## 2018-03-11 DIAGNOSIS — R110 Nausea: Secondary | ICD-10-CM

## 2018-03-11 DIAGNOSIS — Z801 Family history of malignant neoplasm of trachea, bronchus and lung: Secondary | ICD-10-CM | POA: Diagnosis not present

## 2018-03-11 LAB — CMP (CANCER CENTER ONLY)
ALBUMIN: 3.9 g/dL (ref 3.5–5.0)
ALT: 17 U/L (ref 0–44)
AST: 20 U/L (ref 15–41)
Alkaline Phosphatase: 141 U/L — ABNORMAL HIGH (ref 38–126)
Anion gap: 7 (ref 5–15)
BUN: 14 mg/dL (ref 6–20)
CO2: 28 mmol/L (ref 22–32)
Calcium: 9.6 mg/dL (ref 8.9–10.3)
Chloride: 101 mmol/L (ref 98–111)
Creatinine: 0.91 mg/dL (ref 0.44–1.00)
GFR, Est AFR Am: >60 mL/min (ref >60)
GFR, Estimated: >60 mL/min (ref >60)
GLUCOSE: 129 mg/dL — AB (ref 70–99)
POTASSIUM: 3.4 mmol/L — AB (ref 3.5–5.1)
SODIUM: 136 mmol/L (ref 135–145)
Total Bilirubin: 0.3 mg/dL (ref 0.3–1.2)
Total Protein: 6.9 g/dL (ref 6.5–8.1)

## 2018-03-11 LAB — CBC WITH DIFFERENTIAL (CANCER CENTER ONLY)
BASOS ABS: 0.1 10*3/uL (ref 0.0–0.1)
BASOS PCT: 0 %
EOS ABS: 0 10*3/uL (ref 0.0–0.5)
EOS PCT: 0 %
HCT: 37.5 % (ref 34.8–46.6)
Hemoglobin: 12.3 g/dL (ref 11.6–15.9)
LYMPHS ABS: 3.4 10*3/uL — AB (ref 0.9–3.3)
LYMPHS PCT: 10 %
MCH: 30.9 pg (ref 25.1–34.0)
MCHC: 32.8 g/dL (ref 31.5–36.0)
MCV: 94.2 fL (ref 79.5–101.0)
Monocytes Absolute: 3.2 10*3/uL — ABNORMAL HIGH (ref 0.1–0.9)
Monocytes Relative: 10 %
NEUTROS PCT: 80 %
Neutro Abs: 26.5 10*3/uL — ABNORMAL HIGH (ref 1.5–6.5)
Platelet Count: 163 10*3/uL (ref 145–400)
RBC: 3.98 MIL/uL (ref 3.70–5.45)
RDW: 15.9 % — AB (ref 11.2–14.5)
WBC: 33.3 10*3/uL — AB (ref 3.9–10.3)
nRBC: 1 /100 WBC — ABNORMAL HIGH

## 2018-03-11 MED ORDER — SODIUM CHLORIDE 0.9% FLUSH
10.0000 mL | Freq: Once | INTRAVENOUS | Status: AC
  Administered 2018-03-11: 10 mL
  Filled 2018-03-11: qty 10

## 2018-03-11 MED ORDER — HEPARIN SOD (PORK) LOCK FLUSH 100 UNIT/ML IV SOLN
500.0000 [IU] | Freq: Once | INTRAVENOUS | Status: AC
  Administered 2018-03-11: 500 [IU]
  Filled 2018-03-11: qty 5

## 2018-03-11 NOTE — Telephone Encounter (Signed)
Gave patient avs and calendar of upcoming July and august appts °

## 2018-03-12 ENCOUNTER — Encounter (HOSPITAL_COMMUNITY): Payer: 59

## 2018-03-24 ENCOUNTER — Encounter: Payer: Self-pay | Admitting: *Deleted

## 2018-03-24 ENCOUNTER — Inpatient Hospital Stay: Payer: 59

## 2018-03-24 ENCOUNTER — Other Ambulatory Visit: Payer: 59

## 2018-03-24 ENCOUNTER — Other Ambulatory Visit: Payer: Self-pay | Admitting: Oncology

## 2018-03-24 VITALS — BP 144/84 | HR 80 | Temp 98.5°F | Resp 17

## 2018-03-24 DIAGNOSIS — Z170 Estrogen receptor positive status [ER+]: Principal | ICD-10-CM

## 2018-03-24 DIAGNOSIS — C50512 Malignant neoplasm of lower-outer quadrant of left female breast: Secondary | ICD-10-CM

## 2018-03-24 DIAGNOSIS — Z95828 Presence of other vascular implants and grafts: Secondary | ICD-10-CM

## 2018-03-24 LAB — CBC WITH DIFFERENTIAL (CANCER CENTER ONLY)
BASOS ABS: 0 10*3/uL (ref 0.0–0.1)
Basophils Relative: 0 %
EOS PCT: 0 %
Eosinophils Absolute: 0 10*3/uL (ref 0.0–0.5)
HEMATOCRIT: 34.6 % — AB (ref 34.8–46.6)
Hemoglobin: 11.4 g/dL — ABNORMAL LOW (ref 11.6–15.9)
LYMPHS PCT: 6 %
Lymphs Abs: 1.4 10*3/uL (ref 0.9–3.3)
MCH: 31.1 pg (ref 25.1–34.0)
MCHC: 32.8 g/dL (ref 31.5–36.0)
MCV: 94.8 fL (ref 79.5–101.0)
Monocytes Absolute: 1.4 10*3/uL — ABNORMAL HIGH (ref 0.1–0.9)
Monocytes Relative: 6 %
NEUTROS ABS: 20.2 10*3/uL — AB (ref 1.5–6.5)
Neutrophils Relative %: 88 %
PLATELETS: 303 10*3/uL (ref 145–400)
RBC: 3.65 MIL/uL — AB (ref 3.70–5.45)
RDW: 18.8 % — ABNORMAL HIGH (ref 11.2–14.5)
WBC: 23 10*3/uL — AB (ref 3.9–10.3)

## 2018-03-24 LAB — CMP (CANCER CENTER ONLY)
ALBUMIN: 3.7 g/dL (ref 3.5–5.0)
ALT: 30 U/L (ref 0–44)
ANION GAP: 7 (ref 5–15)
AST: 19 U/L (ref 15–41)
Alkaline Phosphatase: 97 U/L (ref 38–126)
BUN: 16 mg/dL (ref 6–20)
CO2: 26 mmol/L (ref 22–32)
Calcium: 9.5 mg/dL (ref 8.9–10.3)
Chloride: 106 mmol/L (ref 98–111)
Creatinine: 0.92 mg/dL (ref 0.44–1.00)
GFR, Est AFR Am: >60 mL/min (ref >60)
GLUCOSE: 230 mg/dL — AB (ref 70–99)
POTASSIUM: 3.8 mmol/L (ref 3.5–5.1)
Sodium: 139 mmol/L (ref 135–145)
Total Bilirubin: 0.4 mg/dL (ref 0.3–1.2)
Total Protein: 6.6 g/dL (ref 6.5–8.1)

## 2018-03-24 MED ORDER — SODIUM CHLORIDE 0.9 % IV SOLN
67.0000 mg/m2 | Freq: Once | INTRAVENOUS | Status: AC
  Administered 2018-03-24: 140 mg via INTRAVENOUS
  Filled 2018-03-24: qty 14

## 2018-03-24 MED ORDER — SODIUM CHLORIDE 0.9 % IV SOLN
616.0000 mg | Freq: Once | INTRAVENOUS | Status: AC
  Administered 2018-03-24: 620 mg via INTRAVENOUS
  Filled 2018-03-24: qty 62

## 2018-03-24 MED ORDER — TRASTUZUMAB CHEMO 150 MG IV SOLR
6.0000 mg/kg | Freq: Once | INTRAVENOUS | Status: AC
  Administered 2018-03-24: 525 mg via INTRAVENOUS
  Filled 2018-03-24: qty 25

## 2018-03-24 MED ORDER — PEGFILGRASTIM 6 MG/0.6ML ~~LOC~~ PSKT
6.0000 mg | PREFILLED_SYRINGE | Freq: Once | SUBCUTANEOUS | Status: AC
  Administered 2018-03-24: 6 mg via SUBCUTANEOUS

## 2018-03-24 MED ORDER — ACETAMINOPHEN 325 MG PO TABS
650.0000 mg | ORAL_TABLET | Freq: Once | ORAL | Status: AC
  Administered 2018-03-24: 650 mg via ORAL

## 2018-03-24 MED ORDER — PALONOSETRON HCL INJECTION 0.25 MG/5ML
0.2500 mg | Freq: Once | INTRAVENOUS | Status: AC
  Administered 2018-03-24: 0.25 mg via INTRAVENOUS

## 2018-03-24 MED ORDER — PEGFILGRASTIM 6 MG/0.6ML ~~LOC~~ PSKT
PREFILLED_SYRINGE | SUBCUTANEOUS | Status: AC
  Filled 2018-03-24: qty 0.6

## 2018-03-24 MED ORDER — SODIUM CHLORIDE 0.9 % IV SOLN
Freq: Once | INTRAVENOUS | Status: AC
  Administered 2018-03-24: 14:00:00 via INTRAVENOUS
  Filled 2018-03-24: qty 5

## 2018-03-24 MED ORDER — PALONOSETRON HCL INJECTION 0.25 MG/5ML
INTRAVENOUS | Status: AC
  Filled 2018-03-24: qty 5

## 2018-03-24 MED ORDER — SODIUM CHLORIDE 0.9% FLUSH
10.0000 mL | Freq: Once | INTRAVENOUS | Status: AC
  Administered 2018-03-24: 10 mL
  Filled 2018-03-24: qty 10

## 2018-03-24 MED ORDER — HEPARIN SOD (PORK) LOCK FLUSH 100 UNIT/ML IV SOLN
500.0000 [IU] | Freq: Once | INTRAVENOUS | Status: AC | PRN
  Administered 2018-03-24: 500 [IU]
  Filled 2018-03-24: qty 5

## 2018-03-24 MED ORDER — DIPHENHYDRAMINE HCL 25 MG PO CAPS
ORAL_CAPSULE | ORAL | Status: AC
  Filled 2018-03-24: qty 2

## 2018-03-24 MED ORDER — SODIUM CHLORIDE 0.9% FLUSH
10.0000 mL | INTRAVENOUS | Status: DC | PRN
  Administered 2018-03-24: 10 mL
  Filled 2018-03-24: qty 10

## 2018-03-24 MED ORDER — SODIUM CHLORIDE 0.9 % IV SOLN
Freq: Once | INTRAVENOUS | Status: AC
  Administered 2018-03-24: 12:00:00 via INTRAVENOUS

## 2018-03-24 MED ORDER — ACETAMINOPHEN 325 MG PO TABS
ORAL_TABLET | ORAL | Status: AC
  Filled 2018-03-24: qty 1

## 2018-03-24 MED ORDER — DIPHENHYDRAMINE HCL 25 MG PO CAPS
50.0000 mg | ORAL_CAPSULE | Freq: Once | ORAL | Status: AC
  Administered 2018-03-24: 50 mg via ORAL

## 2018-03-24 NOTE — Patient Instructions (Signed)
[facility] Discharge Instructions for Patients Receiving Chemotherapy  Today you received the following chemotherapy agents: Herceptin, Taxotere, Carboplatin  To help prevent nausea and vomiting after your treatment, we encourage you to take your nausea medication as directed.   If you develop nausea and vomiting that is not controlled by your nausea medication, call the clinic.   BELOW ARE SYMPTOMS THAT SHOULD BE REPORTED IMMEDIATELY:  *FEVER GREATER THAN 100.5 F  *CHILLS WITH OR WITHOUT FEVER  NAUSEA AND VOMITING THAT IS NOT CONTROLLED WITH YOUR NAUSEA MEDICATION  *UNUSUAL SHORTNESS OF BREATH  *UNUSUAL BRUISING OR BLEEDING  TENDERNESS IN MOUTH AND THROAT WITH OR WITHOUT PRESENCE OF ULCERS  *URINARY PROBLEMS  *BOWEL PROBLEMS  UNUSUAL RASH Items with * indicate a potential emergency and should be followed up as soon as possible.  Feel free to call the clinic should you have any questions or concerns. The clinic phone number is [phone].  Please show the CHEMO ALERT CARD at check-in to the Emergency Department and triage nurse.   

## 2018-03-26 ENCOUNTER — Telehealth: Payer: Self-pay | Admitting: *Deleted

## 2018-03-26 ENCOUNTER — Ambulatory Visit: Payer: 59

## 2018-03-26 NOTE — Telephone Encounter (Signed)
"  This is Designer, multimedia, CM with [facility] calling for advice for mutual patient I spoke with this morning.  She is having a signifant amount of nausea.  No vomiting.  Says she was told to not take Zofran until third day.  What can she use for nausea or can she take the Zofran?  She is going to take Compazine now."  Enka notified of Lorazepam on medication list for nausea and vomiting.  This can be used however advise caution with effect of drowsiness or ability to perform normal activities.

## 2018-03-27 NOTE — Progress Notes (Signed)
FMLA successfully faxed to [employer] at [phone]. Mailed copy to patient address on file. 

## 2018-04-01 NOTE — Progress Notes (Signed)
Lindsey Wright  [phone] [phone]) [phone]     ID: Lindsey Wright DOB: 1966/03/10  MR#: [id number]  CHE#:[id number]  Patient Care Team: [name], MD as PCP - General Magrinat, [name], MD as Consulting Physician (Oncology) Vania Rea, MD as Consulting Physician (Obstetrics and Gynecology) [name], MD as Consulting Physician (General Surgery) Kyung Rudd, MD as Consulting Physician (Radiation Oncology) OTHER MD:   CHIEF COMPLAINT: HER-2 positive breast cancer  CURRENT TREATMENT: Neoadjuvant chemotherapy  INTERVAL HISTORY: Lindsey Wright returns today for a follow-up and treatment of her HER-2 positive breast cancer.    Today is day 10 cycle 4 of 6 planned cycles of carboplatin, docetaxel, trastuzumab, and pertuzumab, given 21 days apart. She notes that following her last treatment, she had a metallic taste and it was mildly alleviated with lemon heads. She notes that her metallic taste in her mouth was alleviated today and only occurred for 2 days.    REVIEW OF SYSTEMS: Lindsey Wright reports that she is still working and she only takes off the week of chemotherapy. She denies oral sores or thrush. She denies fatigue. She notes that she will have mild diarrhea with one loose stool typically. She is using Questran PRN. She denies unusual headaches, visual changes, nausea, vomiting, or dizziness. There has been no unusual cough, phlegm production, or pleurisy. This been no change in bowel or bladder habits. She denies unexplained fatigue or unexplained weight loss, bleeding, rash, or fever. A detailed review of systems was otherwise stable.    HISTORY OF CURRENT ILLNESS: From the original intake note:  "Lindsey Wright" palpated a mass in the left breast about 2 weeks ago. She followed up with her gynecologist, Dr. Jean Rosenthal who referred her to the [facility] for mammography. She underwent unilateral left diagnostic mammography with tomography and left breast  ultrasonography at [facility] on 12/20/2017 showing: breast density category B. Suspicious newly palpable left breast mass at the 5 o'clock lower outer position with internal blow flow measuring 2.3 x 1.8 x 2.5 cm. There are either 2 adjacent masses or a single complicated mass at the 5:36 lower outer position, 4 cm from the nipple measuring 1.8 x 0.5 by 0.9 cm which may represent fibrocystic changes versus a solid mass. No other suspicious findings. Ultrasound showed normal axillary nodes.  Accordingly on 12/23/2017 she proceeded to biopsy of the left breast areas in question. The pathology from this procedure showed ([id number]): At both the 5 o'clock spanning 1.2 cm and 3:30 position spanning 0.7 cm, invasive ductal carcinoma, grade III. Prognostic indicators significant for: estrogen receptor, 10% positive with weak staining intensity and progesterone receptor, 0% negative. Proliferation marker Ki67 at 90%. HER2 amplified with ratios ER2/CEP17 signals 2.33 and average HER2 copies per cell 4.65  The patient's subsequent history is as detailed below.   PAST MEDICAL HISTORY: Past Medical History:  Diagnosis Date  . Anemia    iron deficient anemia  . Breast cancer ([place])   . History of Bell's palsy     x 2 as teenager and in 20's  . Hypertension   . Menorrhagia   . Overweight(278.02)     PAST SURGICAL HISTORY: Past Surgical History:  Procedure Laterality Date  . BREAST BIOPSY Right 02/01/2015   benign  . CESAREAN SECTION CLASSICAL    . CHOLECYSTECTOMY    . COSMETIC SURGERY    . PORTACATH PLACEMENT Right 01/17/2018   Procedure: INSERTION PORT-A-CATH;  Surgeon: [name], MD;  Location: [facility];  Service: General;  Laterality: Right;  . TUBAL LIGATION      FAMILY HISTORY Family History  Problem Relation Age of Onset  . Hypertension Mother   . Diabetes Mother   . Stroke Mother   . Alcohol abuse Father   . Cancer Father        lung ca  . Hypertension  Father   . Hypertension Sister   . Colon cancer Neg Hx   . Colon polyps Neg Hx   . Rectal cancer Neg Hx   . Stomach cancer Neg Hx    The patient's father was diagnosed with lung cancer at age 78 and died the same year. The patient's mother died at age 55 due to several strokes. The patient had 1 brother who died due to strokes and possibly a MI. The patient has 1 sister. She denies a history of breast or ovarian cancer in the family.    GYNECOLOGIC HISTORY:  No LMP recorded. (Menstrual status: Perimenopausal). Menarche: 52 years old Age at first live birth: 52 years old The patient is GXP2. The patient is not having periods, with her LMP being in 2010. She used oral contraceptive for about 10 years with no complications. She never used HRT.    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 Her husband, Lindsey Wright, works part time in Therapist, art.  The patient's daughter Lindsey Wright age 7, is a Ship broker and starting a job. The patient's daughter Lindsey Wright age 57, is a Ship broker.  Both of the patient's daughters live with her.     ADVANCED DIRECTIVES:    HEALTH MAINTENANCE: Social History   Tobacco Use  . Smoking status: Never Smoker  . Smokeless tobacco: Never Used  Substance Use Topics  . Alcohol use: No    Alcohol/week: 0.0 oz  . Drug use: No     Colonoscopy: 03/27/2017 polyp removal/ Dr. Deeann Saint  PAP: May 2017 normal  Bone density:   Allergies  Allergen Reactions  . Ace Inhibitors Cough    Current Outpatient Medications  Medication Sig Dispense Refill  . cholestyramine (QUESTRAN) 4 g packet Take 1 packet (4 g total) by mouth 2 (two) times daily. (Patient taking differently: Take 4 g by mouth 2 (two) times daily. ) 60 each 1  . dexamethasone (DECADRON) 4 MG tablet Take 2 tablets (8 mg total) by mouth 2 (two) times daily. Start the day before Taxotere. Take once the day after, then 2 times a day x 2d. 30 tablet 1  . diphenoxylate-atropine  (LOMOTIL) 2.5-0.025 MG tablet Take 1 tablet by mouth 4 (four) times daily as needed for diarrhea or loose stools. 30 tablet 0  . lidocaine-prilocaine (EMLA) cream Apply to affected area once 30 g 3  . loperamide (LOPERAMIDE A-D) 2 MG tablet Take 4 mg by mouth as needed for diarrhea or loose stools.    Marland Kitchen LORazepam (ATIVAN) 0.5 MG tablet Take 1 tablet (0.5 mg total) by mouth at bedtime as needed (Nausea or vomiting). 30 tablet 0  . losartan (COZAAR) 50 MG tablet TAKE 1 TABLET (50 MG TOTAL) BY MOUTH DAILY. (Patient taking differently: TAKE 1/2 TABLET (25 MG TOTAL) BY MOUTH DAILY.) 30 tablet 11  . ondansetron (ZOFRAN) 8 MG tablet Take 1 tablet (8 mg total) by mouth every 8 (eight) hours as needed for nausea or vomiting. 20 tablet 3  . potassium chloride SA (KLOR-CON M20) 20 MEQ tablet Take 2 tablets (40 mEq total) by mouth daily. [place]  tablet 11  . prochlorperazine (COMPAZINE) 10 MG tablet Take 1 tablet (10 mg total) by mouth every 6 (six) hours as needed (Nausea or vomiting). 30 tablet 1   No current facility-administered medications for this visit.     OBJECTIVE: Middle-aged African-American woman in no acute distress  Vitals:   04/02/18 1421  BP: 137/84  Pulse: 90  Resp: 18  Temp: 98.8 F (37.1 C)  SpO2: 100%     Body mass index is 30.08 kg/m.   Wt Readings from Last 3 Encounters:  04/02/18 189 lb 3.2 oz (85.8 kg)  03/11/18 185 lb 6.4 oz (84.1 kg)  03/04/18 192 lb 4.8 oz (87.2 kg)  ECOG FS:1  Sclerae unicteric, pupils round and equal No cervical or supraclavicular adenopathy Lungs no rales or rhonchi Heart regular rate and rhythm Abd soft, nontender, positive bowel sounds MSK no focal spinal tenderness, no upper extremity lymphedema Neuro: nonfocal, well oriented, appropriate affect Breasts: I no longer feel a mass in the left breast.  The right breast is unremarkable.  Both axillae are benign.    LAB RESULTS:  CMP     Component Value Date/Time   NA 139 03/24/2018 1151   K  3.8 03/24/2018 1151   CL 106 03/24/2018 1151   CO2 26 03/24/2018 1151   GLUCOSE 230 (H) 03/24/2018 1151   BUN 16 03/24/2018 1151   CREATININE 0.92 03/24/2018 1151   CALCIUM 9.5 03/24/2018 1151   PROT 6.6 03/24/2018 1151   ALBUMIN 3.7 03/24/2018 1151   AST 19 03/24/2018 1151   ALT 30 03/24/2018 1151   ALKPHOS 97 03/24/2018 1151   BILITOT 0.4 03/24/2018 1151   GFRNONAA >60 03/24/2018 1151   GFRAA >60 03/24/2018 1151    No results found for: TOTALPROTELP, ALBUMINELP, A1GS, A2GS, BETS, BETA2SER, GAMS, MSPIKE, SPEI  No results found for: KPAFRELGTCHN, LAMBDASER, KAPLAMBRATIO  Lab Results  Component Value Date   WBC 17.7 (H) 04/02/2018   NEUTROABS 14.0 (H) 04/02/2018   HGB 11.3 (L) 04/02/2018   HCT 35.3 04/02/2018   MCV 97.5 04/02/2018   PLT 129 (L) 04/02/2018    _0 @  No results found for: LABCA2  No components found for: [id number]  No results for input(s): INR in the last 168 hours.  No results found for: LABCA2  No results found for: HWE993  No results found for: ZJI967  No results found for: ELF810  No results found for: CA2729  No components found for: HGQUANT  No results found for: CEA1 / No results found for: CEA1   No results found for: AFPTUMOR  No results found for: CHROMOGRNA  No results found for: PSA1  Appointment on 04/02/2018  Component Date Value Ref Range Status  . WBC Count 04/02/2018 17.7* 3.9 - 10.3 K/uL Final  . RBC 04/02/2018 3.62* 3.70 - 5.45 MIL/uL Final  . Hemoglobin 04/02/2018 11.3* 11.6 - 15.9 g/dL Final  . HCT 04/02/2018 35.3  34.8 - 46.6 % Final  . MCV 04/02/2018 97.5  79.5 - 101.0 fL Final  . MCH 04/02/2018 31.2  25.1 - 34.0 pg Final  . MCHC 04/02/2018 32.0  31.5 - 36.0 g/dL Final  . RDW 04/02/2018 17.8* 11.2 - 14.5 % Final  . Platelet Count 04/02/2018 129* 145 - 400 K/uL Final  . Neutrophils Relative % 04/02/2018 79  % Final  . Neutro Abs 04/02/2018 14.0* 1.5 - 6.5 K/uL Final  . Lymphocytes Relative 04/02/2018  14  % Final  . Lymphs Abs 04/02/2018 2.4  0.9 - 3.3 K/uL Final  . Monocytes Relative 04/02/2018 7  % Final  . Monocytes Absolute 04/02/2018 1.2* 0.1 - 0.9 K/uL Final  . Eosinophils Relative 04/02/2018 0  % Final  . Eosinophils Absolute 04/02/2018 0.0  0.0 - 0.5 K/uL Final  . Basophils Relative 04/02/2018 0  % Final  . Basophils Absolute 04/02/2018 0.0  0.0 - 0.1 K/uL Final   Performed at [facility], [address] [place], [place] [address]    (this displays the last labs from the last 3 days)  No results found for: TOTALPROTELP, ALBUMINELP, A1GS, A2GS, BETS, BETA2SER, GAMS, MSPIKE, SPEI (this displays SPEP labs)  No results found for: KPAFRELGTCHN, LAMBDASER, KAPLAMBRATIO (kappa/lambda light chains)  No results found for: HGBA, HGBA2QUANT, HGBFQUANT, HGBSQUAN (Hemoglobinopathy evaluation)   No results found for: LDH  Lab Results  Component Value Date   IRON 104 09/27/2008   IRONPCTSAT 25.1 09/27/2008   (Iron and TIBC)  No results found for: FERRITIN  Urinalysis    Component Value Date/Time   COLORURINE YELLOW 02/15/2018 2014   APPEARANCEUR CLEAR 02/15/2018 2014   LABSPEC 1.011 02/15/2018 2014   PHURINE 7.0 02/15/2018 2014   [place] 02/15/2018 2014   [place] (A) 02/15/2018 2014   [place] NEGATIVE 02/15/2018 2014   [place] NEGATIVE 02/15/2018 2014   [place] NEGATIVE 02/15/2018 2014   NITRITE NEGATIVE 02/15/2018 2014   LEUKOCYTESUR NEGATIVE 02/15/2018 2014     STUDIES: No results found.  ELIGIBLE FOR AVAILABLE RESEARCH PROTOCOL: Exact signs of study  ASSESSMENT: 52 y.o.  [place], [place] woman status post left breast lower outer quadrant biopsy 12/23/2017 for a clinically multifocal T2 N0, stage II invasive ductal carcinoma, grade 3, essentially estrogen receptor and progesterone receptor negative, but HER-2 amplified, with an MIB-1 of 90%.  (a) breast MRI 01/07/2018 shows the 2.6 cm main mass and 4 additional  smaller masses spanning 7.1 cm; there was a suspicious left axillary lymph node  (b) left axillary lymph node biopsy 01/14/2018 was benign/discordant (no lymph node tissue)  (1) neoadjuvant chemotherapy will consist of carboplatin and docetaxel every 21 days for 6 cycles starting 01/20/2018  (2) anti-HER-2 immunotherapy will consist of trastuzumab and Pertuzumab, to be continued for 6 months, starting concurrently with chemotherapy (pertuzumab discontinued after cycle 2 due to diarrhea)  (a) baseline echocardiogram 01/08/2018 shows an ejection fraction in the 60-65% range  (3) definitive surgery pending.gcmend  (4) adjuvant radiation as appropriate  (5) consider antiestrogens given the weak receptor positivity  PLAN: Lindsey Wright is doing remarkably well with her chemotherapy, with no evidence of endorgan toxicity to this point.  She will be ready for repeat echocardiogram sometime in August.  She will have her fifth cycle on 04/14/2018.  She will see [place] a week later.  She will then have her final cycle on 05/05/2018.  Before she sees me again on 05/19/2018 she will have a repeat breast MRI.  She will then be ready to return to Dr. Marlou Starks for definitive surgery  She knows to call for any other issues that may develop before the next visit.    Magrinat, [name], MD  04/02/18 2:53 PM Medical Oncology and Hematology [facility] [address] [place], [place] [address] Tel. [phone]    Fax. [phone]    I, Soijett Blue am acting as scribe for Dr. Sarajane Jews C. Magrinat.  I, Lurline Del MD, have reviewed the above documentation for accuracy and completeness, and I agree with the above.

## 2018-04-02 ENCOUNTER — Inpatient Hospital Stay (HOSPITAL_BASED_OUTPATIENT_CLINIC_OR_DEPARTMENT_OTHER): Payer: 59 | Admitting: Oncology

## 2018-04-02 ENCOUNTER — Inpatient Hospital Stay: Payer: 59

## 2018-04-02 VITALS — BP 137/84 | HR 90 | Temp 98.8°F | Resp 18 | Ht 66.5 in | Wt 189.2 lb

## 2018-04-02 DIAGNOSIS — D509 Iron deficiency anemia, unspecified: Secondary | ICD-10-CM

## 2018-04-02 DIAGNOSIS — Z170 Estrogen receptor positive status [ER+]: Secondary | ICD-10-CM

## 2018-04-02 DIAGNOSIS — Z79899 Other long term (current) drug therapy: Secondary | ICD-10-CM

## 2018-04-02 DIAGNOSIS — Z801 Family history of malignant neoplasm of trachea, bronchus and lung: Secondary | ICD-10-CM

## 2018-04-02 DIAGNOSIS — C50512 Malignant neoplasm of lower-outer quadrant of left female breast: Secondary | ICD-10-CM | POA: Diagnosis not present

## 2018-04-02 DIAGNOSIS — R5383 Other fatigue: Secondary | ICD-10-CM

## 2018-04-02 DIAGNOSIS — Z5112 Encounter for antineoplastic immunotherapy: Secondary | ICD-10-CM

## 2018-04-02 DIAGNOSIS — Z95828 Presence of other vascular implants and grafts: Secondary | ICD-10-CM

## 2018-04-02 DIAGNOSIS — Z5111 Encounter for antineoplastic chemotherapy: Secondary | ICD-10-CM

## 2018-04-02 DIAGNOSIS — I10 Essential (primary) hypertension: Secondary | ICD-10-CM

## 2018-04-02 DIAGNOSIS — R110 Nausea: Secondary | ICD-10-CM

## 2018-04-02 LAB — CBC WITH DIFFERENTIAL (CANCER CENTER ONLY)
BASOS PCT: 0 %
Basophils Absolute: 0 10*3/uL (ref 0.0–0.1)
EOS PCT: 0 %
Eosinophils Absolute: 0 10*3/uL (ref 0.0–0.5)
HCT: 35.3 % (ref 34.8–46.6)
Hemoglobin: 11.3 g/dL — ABNORMAL LOW (ref 11.6–15.9)
LYMPHS ABS: 2.4 10*3/uL (ref 0.9–3.3)
Lymphocytes Relative: 14 %
MCH: 31.2 pg (ref 25.1–34.0)
MCHC: 32 g/dL (ref 31.5–36.0)
MCV: 97.5 fL (ref 79.5–101.0)
MONO ABS: 1.2 10*3/uL — AB (ref 0.1–0.9)
MONOS PCT: 7 %
Neutro Abs: 14 10*3/uL — ABNORMAL HIGH (ref 1.5–6.5)
Neutrophils Relative %: 79 %
PLATELETS: 129 10*3/uL — AB (ref 145–400)
RBC: 3.62 MIL/uL — ABNORMAL LOW (ref 3.70–5.45)
RDW: 17.8 % — AB (ref 11.2–14.5)
WBC Count: 17.7 10*3/uL — ABNORMAL HIGH (ref 3.9–10.3)

## 2018-04-02 LAB — CMP (CANCER CENTER ONLY)
ALT: 16 U/L (ref 0–44)
ANION GAP: 7 (ref 5–15)
AST: 16 U/L (ref 15–41)
Albumin: 3.8 g/dL (ref 3.5–5.0)
Alkaline Phosphatase: 131 U/L — ABNORMAL HIGH (ref 38–126)
BUN: 9 mg/dL (ref 6–20)
CO2: 30 mmol/L (ref 22–32)
Calcium: 9.1 mg/dL (ref 8.9–10.3)
Chloride: 104 mmol/L (ref 98–111)
Creatinine: 0.77 mg/dL (ref 0.44–1.00)
GFR, Est AFR Am: >60 mL/min (ref >60)
GFR, Estimated: >60 mL/min (ref >60)
GLUCOSE: 113 mg/dL — AB (ref 70–99)
POTASSIUM: 3.4 mmol/L — AB (ref 3.5–5.1)
SODIUM: 141 mmol/L (ref 135–145)
Total Bilirubin: 0.3 mg/dL (ref 0.3–1.2)
Total Protein: 6.5 g/dL (ref 6.5–8.1)

## 2018-04-02 MED ORDER — HEPARIN SOD (PORK) LOCK FLUSH 100 UNIT/ML IV SOLN
500.0000 [IU] | Freq: Once | INTRAVENOUS | Status: AC
  Administered 2018-04-02: 500 [IU]
  Filled 2018-04-02: qty 5

## 2018-04-02 MED ORDER — SODIUM CHLORIDE 0.9% FLUSH
10.0000 mL | Freq: Once | INTRAVENOUS | Status: AC
  Administered 2018-04-02: 10 mL
  Filled 2018-04-02: qty 10

## 2018-04-14 ENCOUNTER — Other Ambulatory Visit: Payer: Self-pay | Admitting: Oncology

## 2018-04-14 ENCOUNTER — Inpatient Hospital Stay: Payer: 59 | Attending: Oncology

## 2018-04-14 ENCOUNTER — Inpatient Hospital Stay: Payer: 59

## 2018-04-14 ENCOUNTER — Telehealth: Payer: Self-pay | Admitting: Oncology

## 2018-04-14 VITALS — BP 153/83 | HR 89 | Temp 98.5°F | Resp 16

## 2018-04-14 DIAGNOSIS — N920 Excessive and frequent menstruation with regular cycle: Secondary | ICD-10-CM | POA: Insufficient documentation

## 2018-04-14 DIAGNOSIS — Z170 Estrogen receptor positive status [ER+]: Secondary | ICD-10-CM

## 2018-04-14 DIAGNOSIS — D509 Iron deficiency anemia, unspecified: Secondary | ICD-10-CM | POA: Insufficient documentation

## 2018-04-14 DIAGNOSIS — G629 Polyneuropathy, unspecified: Secondary | ICD-10-CM | POA: Insufficient documentation

## 2018-04-14 DIAGNOSIS — Z95828 Presence of other vascular implants and grafts: Secondary | ICD-10-CM

## 2018-04-14 DIAGNOSIS — Z79899 Other long term (current) drug therapy: Secondary | ICD-10-CM | POA: Diagnosis not present

## 2018-04-14 DIAGNOSIS — Z5111 Encounter for antineoplastic chemotherapy: Secondary | ICD-10-CM | POA: Diagnosis not present

## 2018-04-14 DIAGNOSIS — Z5112 Encounter for antineoplastic immunotherapy: Secondary | ICD-10-CM | POA: Diagnosis not present

## 2018-04-14 DIAGNOSIS — C50512 Malignant neoplasm of lower-outer quadrant of left female breast: Secondary | ICD-10-CM

## 2018-04-14 DIAGNOSIS — I10 Essential (primary) hypertension: Secondary | ICD-10-CM | POA: Diagnosis not present

## 2018-04-14 DIAGNOSIS — Z7689 Persons encountering health services in other specified circumstances: Secondary | ICD-10-CM | POA: Insufficient documentation

## 2018-04-14 DIAGNOSIS — Z801 Family history of malignant neoplasm of trachea, bronchus and lung: Secondary | ICD-10-CM | POA: Insufficient documentation

## 2018-04-14 LAB — CBC WITH DIFFERENTIAL (CANCER CENTER ONLY)
BASOS ABS: 0 10*3/uL (ref 0.0–0.1)
BASOS PCT: 0 %
EOS ABS: 0 10*3/uL (ref 0.0–0.5)
EOS PCT: 0 %
HCT: 34.8 % (ref 34.8–46.6)
Hemoglobin: 11.5 g/dL — ABNORMAL LOW (ref 11.6–15.9)
LYMPHS ABS: 1.1 10*3/uL (ref 0.9–3.3)
LYMPHS PCT: 6 %
MCH: 32.3 pg (ref 25.1–34.0)
MCHC: 33 g/dL (ref 31.5–36.0)
MCV: 97.9 fL (ref 79.5–101.0)
MONO ABS: 0.6 10*3/uL (ref 0.1–0.9)
Monocytes Relative: 3 %
Neutro Abs: 16.8 10*3/uL — ABNORMAL HIGH (ref 1.5–6.5)
Neutrophils Relative %: 91 %
PLATELETS: 262 10*3/uL (ref 145–400)
RBC: 3.55 MIL/uL — AB (ref 3.70–5.45)
RDW: 19.2 % — ABNORMAL HIGH (ref 11.2–14.5)
WBC: 18.6 10*3/uL — AB (ref 3.9–10.3)

## 2018-04-14 LAB — CMP (CANCER CENTER ONLY)
ALBUMIN: 3.5 g/dL (ref 3.5–5.0)
ALT: 29 U/L (ref 0–44)
AST: 20 U/L (ref 15–41)
Alkaline Phosphatase: 91 U/L (ref 38–126)
Anion gap: 10 (ref 5–15)
BILIRUBIN TOTAL: 0.4 mg/dL (ref 0.3–1.2)
BUN: 12 mg/dL (ref 6–20)
CHLORIDE: 105 mmol/L (ref 98–111)
CO2: 24 mmol/L (ref 22–32)
CREATININE: 0.94 mg/dL (ref 0.44–1.00)
Calcium: 9.1 mg/dL (ref 8.9–10.3)
GFR, Est AFR Am: >60 mL/min (ref >60)
GLUCOSE: 309 mg/dL — AB (ref 70–99)
Potassium: 3.7 mmol/L (ref 3.5–5.1)
Sodium: 139 mmol/L (ref 135–145)
TOTAL PROTEIN: 6.4 g/dL — AB (ref 6.5–8.1)

## 2018-04-14 MED ORDER — PALONOSETRON HCL INJECTION 0.25 MG/5ML
INTRAVENOUS | Status: AC
Start: 2018-04-14 — End: ?
  Filled 2018-04-14: qty 5

## 2018-04-14 MED ORDER — ACETAMINOPHEN 325 MG PO TABS
ORAL_TABLET | ORAL | Status: AC
  Filled 2018-04-14: qty 2

## 2018-04-14 MED ORDER — TRASTUZUMAB CHEMO 150 MG IV SOLR
6.0000 mg/kg | Freq: Once | INTRAVENOUS | Status: AC
  Administered 2018-04-14: 525 mg via INTRAVENOUS
  Filled 2018-04-14: qty 25

## 2018-04-14 MED ORDER — SODIUM CHLORIDE 0.9 % IV SOLN
67.0000 mg/m2 | Freq: Once | INTRAVENOUS | Status: AC
  Administered 2018-04-14: 140 mg via INTRAVENOUS
  Filled 2018-04-14: qty 14

## 2018-04-14 MED ORDER — DIPHENHYDRAMINE HCL 25 MG PO CAPS
50.0000 mg | ORAL_CAPSULE | Freq: Once | ORAL | Status: AC
  Administered 2018-04-14: 50 mg via ORAL

## 2018-04-14 MED ORDER — PALONOSETRON HCL INJECTION 0.25 MG/5ML
0.2500 mg | Freq: Once | INTRAVENOUS | Status: AC
  Administered 2018-04-14: 0.25 mg via INTRAVENOUS

## 2018-04-14 MED ORDER — SODIUM CHLORIDE 0.9 % IV SOLN
610.0000 mg | Freq: Once | INTRAVENOUS | Status: AC
  Administered 2018-04-14: 610 mg via INTRAVENOUS
  Filled 2018-04-14: qty 61

## 2018-04-14 MED ORDER — SODIUM CHLORIDE 0.9 % IV SOLN
Freq: Once | INTRAVENOUS | Status: AC
  Administered 2018-04-14: 11:00:00 via INTRAVENOUS
  Filled 2018-04-14: qty 250

## 2018-04-14 MED ORDER — SODIUM CHLORIDE 0.9% FLUSH
10.0000 mL | Freq: Once | INTRAVENOUS | Status: AC
  Administered 2018-04-14: 10 mL
  Filled 2018-04-14: qty 10

## 2018-04-14 MED ORDER — HEPARIN SOD (PORK) LOCK FLUSH 100 UNIT/ML IV SOLN
500.0000 [IU] | Freq: Once | INTRAVENOUS | Status: AC | PRN
  Administered 2018-04-14: 500 [IU]
  Filled 2018-04-14: qty 5

## 2018-04-14 MED ORDER — DIPHENHYDRAMINE HCL 25 MG PO CAPS
ORAL_CAPSULE | ORAL | Status: AC
Start: 2018-04-14 — End: ?
  Filled 2018-04-14: qty 2

## 2018-04-14 MED ORDER — PEGFILGRASTIM 6 MG/0.6ML ~~LOC~~ PSKT
PREFILLED_SYRINGE | SUBCUTANEOUS | Status: AC
  Filled 2018-04-14: qty 0.6

## 2018-04-14 MED ORDER — SODIUM CHLORIDE 0.9 % IV SOLN
Freq: Once | INTRAVENOUS | Status: AC
  Administered 2018-04-14: 12:00:00 via INTRAVENOUS
  Filled 2018-04-14: qty 5

## 2018-04-14 MED ORDER — ACETAMINOPHEN 325 MG PO TABS
650.0000 mg | ORAL_TABLET | Freq: Once | ORAL | Status: AC
  Administered 2018-04-14: 650 mg via ORAL

## 2018-04-14 MED ORDER — PEGFILGRASTIM 6 MG/0.6ML ~~LOC~~ PSKT
6.0000 mg | PREFILLED_SYRINGE | Freq: Once | SUBCUTANEOUS | Status: AC
  Administered 2018-04-14: 6 mg via SUBCUTANEOUS

## 2018-04-14 MED ORDER — SODIUM CHLORIDE 0.9% FLUSH
10.0000 mL | INTRAVENOUS | Status: DC | PRN
  Administered 2018-04-14: 10 mL
  Filled 2018-04-14: qty 10

## 2018-04-14 NOTE — Telephone Encounter (Signed)
Gave patient avs and calendar of upcoming appts per 8/5 sch message

## 2018-04-14 NOTE — Patient Instructions (Signed)
[facility] Discharge Instructions for Patients Receiving Chemotherapy  Today you received the following chemotherapy agents :  Herceptin,  Taxotere,  Carboplatin.  To help prevent nausea and vomiting after your treatment, we encourage you to take your nausea medication as prescribed.  CALL  DR.  DALTON  MCLEAN, CARDIOLOGIST  TO SCHEDULE  ECHOCARDIOGRAM  TO BE DONE IN AUGUST AS PER DR. MAGRINAT'S INSTRUCTIONS. OFFICE  PHONE    [phone]    If you develop nausea and vomiting that is not controlled by your nausea medication, call the clinic.   BELOW ARE SYMPTOMS THAT SHOULD BE REPORTED IMMEDIATELY:  *FEVER GREATER THAN 100.5 F  *CHILLS WITH OR WITHOUT FEVER  NAUSEA AND VOMITING THAT IS NOT CONTROLLED WITH YOUR NAUSEA MEDICATION  *UNUSUAL SHORTNESS OF BREATH  *UNUSUAL BRUISING OR BLEEDING  TENDERNESS IN MOUTH AND THROAT WITH OR WITHOUT PRESENCE OF ULCERS  *URINARY PROBLEMS  *BOWEL PROBLEMS  UNUSUAL RASH Items with * indicate a potential emergency and should be followed up as soon as possible.  Feel free to call the clinic should you have any questions or concerns. The clinic phone number is [phone].  Please show the [facility] at check-in to the Emergency Department and triage nurse.

## 2018-04-15 ENCOUNTER — Other Ambulatory Visit: Payer: Self-pay | Admitting: *Deleted

## 2018-04-15 DIAGNOSIS — C50512 Malignant neoplasm of lower-outer quadrant of left female breast: Secondary | ICD-10-CM

## 2018-04-15 DIAGNOSIS — Z170 Estrogen receptor positive status [ER+]: Principal | ICD-10-CM

## 2018-04-15 MED ORDER — PROCHLORPERAZINE MALEATE 10 MG PO TABS: 10.0000 mg | ORAL_TABLET | Freq: Four times a day (QID) | ORAL | 1 refills | Status: DC | PRN

## 2018-04-15 MED ORDER — DEXAMETHASONE 4 MG PO TABS: 8.0000 mg | ORAL_TABLET | Freq: Two times a day (BID) | ORAL | 1 refills | Status: DC

## 2018-04-16 ENCOUNTER — Other Ambulatory Visit: Payer: Self-pay | Admitting: *Deleted

## 2018-04-16 ENCOUNTER — Ambulatory Visit: Payer: 59

## 2018-04-16 DIAGNOSIS — Z170 Estrogen receptor positive status [ER+]: Principal | ICD-10-CM

## 2018-04-16 DIAGNOSIS — C50512 Malignant neoplasm of lower-outer quadrant of left female breast: Secondary | ICD-10-CM

## 2018-04-18 ENCOUNTER — Other Ambulatory Visit: Payer: Self-pay | Admitting: Oncology

## 2018-04-22 ENCOUNTER — Inpatient Hospital Stay: Payer: 59

## 2018-04-22 ENCOUNTER — Inpatient Hospital Stay (HOSPITAL_BASED_OUTPATIENT_CLINIC_OR_DEPARTMENT_OTHER): Payer: 59 | Admitting: Adult Health

## 2018-04-22 ENCOUNTER — Encounter: Payer: Self-pay | Admitting: Adult Health

## 2018-04-22 VITALS — BP 134/63 | HR 103 | Temp 97.9°F | Resp 18 | Ht 66.5 in | Wt 190.1 lb

## 2018-04-22 DIAGNOSIS — C50512 Malignant neoplasm of lower-outer quadrant of left female breast: Secondary | ICD-10-CM

## 2018-04-22 DIAGNOSIS — Z170 Estrogen receptor positive status [ER+]: Secondary | ICD-10-CM

## 2018-04-22 DIAGNOSIS — Z95828 Presence of other vascular implants and grafts: Secondary | ICD-10-CM

## 2018-04-22 DIAGNOSIS — D509 Iron deficiency anemia, unspecified: Secondary | ICD-10-CM

## 2018-04-22 DIAGNOSIS — Z801 Family history of malignant neoplasm of trachea, bronchus and lung: Secondary | ICD-10-CM

## 2018-04-22 DIAGNOSIS — I10 Essential (primary) hypertension: Secondary | ICD-10-CM

## 2018-04-22 DIAGNOSIS — Z79899 Other long term (current) drug therapy: Secondary | ICD-10-CM

## 2018-04-22 DIAGNOSIS — Z7689 Persons encountering health services in other specified circumstances: Secondary | ICD-10-CM

## 2018-04-22 DIAGNOSIS — Z5112 Encounter for antineoplastic immunotherapy: Secondary | ICD-10-CM

## 2018-04-22 DIAGNOSIS — N920 Excessive and frequent menstruation with regular cycle: Secondary | ICD-10-CM

## 2018-04-22 DIAGNOSIS — Z5111 Encounter for antineoplastic chemotherapy: Secondary | ICD-10-CM | POA: Diagnosis not present

## 2018-04-22 LAB — CMP (CANCER CENTER ONLY)
ALBUMIN: 3.6 g/dL (ref 3.5–5.0)
ALT: 14 U/L (ref 0–44)
ANION GAP: 13 (ref 5–15)
AST: 14 U/L — ABNORMAL LOW (ref 15–41)
Alkaline Phosphatase: 125 U/L (ref 38–126)
BILIRUBIN TOTAL: 0.3 mg/dL (ref 0.3–1.2)
BUN: 8 mg/dL (ref 6–20)
CO2: 24 mmol/L (ref 22–32)
Calcium: 8.8 mg/dL — ABNORMAL LOW (ref 8.9–10.3)
Chloride: 103 mmol/L (ref 98–111)
Creatinine: 0.87 mg/dL (ref 0.44–1.00)
GFR, Est AFR Am: >60 mL/min (ref >60)
GFR, Estimated: >60 mL/min (ref >60)
GLUCOSE: 210 mg/dL — AB (ref 70–99)
POTASSIUM: 3.5 mmol/L (ref 3.5–5.1)
Sodium: 140 mmol/L (ref 135–145)
TOTAL PROTEIN: 6.2 g/dL — AB (ref 6.5–8.1)

## 2018-04-22 LAB — CBC WITH DIFFERENTIAL (CANCER CENTER ONLY)
BASOS ABS: 0.1 10*3/uL (ref 0.0–0.1)
Basophils Relative: 0 %
Eosinophils Absolute: 0 10*3/uL (ref 0.0–0.5)
Eosinophils Relative: 0 %
HEMATOCRIT: 34.3 % — AB (ref 34.8–46.6)
HEMOGLOBIN: 11.2 g/dL — AB (ref 11.6–15.9)
LYMPHS PCT: 11 %
Lymphs Abs: 2.6 10*3/uL (ref 0.9–3.3)
MCH: 31.8 pg (ref 25.1–34.0)
MCHC: 32.6 g/dL (ref 31.5–36.0)
MCV: 97.7 fL (ref 79.5–101.0)
MONO ABS: 1.4 10*3/uL — AB (ref 0.1–0.9)
Monocytes Relative: 6 %
NEUTROS ABS: 20.8 10*3/uL — AB (ref 1.5–6.5)
Neutrophils Relative %: 83 %
Platelet Count: 158 10*3/uL (ref 145–400)
RBC: 3.51 MIL/uL — ABNORMAL LOW (ref 3.70–5.45)
RDW: 18.9 % — AB (ref 11.2–14.5)
WBC Count: 24.9 10*3/uL — ABNORMAL HIGH (ref 3.9–10.3)

## 2018-04-22 MED ORDER — HEPARIN SOD (PORK) LOCK FLUSH 100 UNIT/ML IV SOLN
500.0000 [IU] | Freq: Once | INTRAVENOUS | Status: AC
  Administered 2018-04-22: 500 [IU]
  Filled 2018-04-22: qty 5

## 2018-04-22 MED ORDER — SODIUM CHLORIDE 0.9% FLUSH
10.0000 mL | Freq: Once | INTRAVENOUS | Status: AC
  Administered 2018-04-22: 10 mL
  Filled 2018-04-22: qty 10

## 2018-04-22 NOTE — Progress Notes (Signed)
[place]  [phone] [phone]     ID: Lindsey Wright DOB: Feb 13, 1966  MR#: [id number]  QQV#:[id number]  Patient Care Team: [name], MD as PCP - General Magrinat, [name], MD as Consulting Physician (Oncology) Vania Rea, MD as Consulting Physician (Obstetrics and Gynecology) [name], MD as Consulting Physician (General Surgery) Kyung Rudd, MD as Consulting Physician (Radiation Oncology) OTHER MD:   CHIEF COMPLAINT: HER-2 positive breast cancer  CURRENT TREATMENT: Neoadjuvant chemotherapy  INTERVAL HISTORY: Lindsey Wright returns today for a follow-up and treatment of her HER-2 positive breast cancer.    Today is day 8 cycle 5 of 6 planned cycles of carboplatin, docetaxel, trastuzumab, and pertuzumab, given 21 days apart.   REVIEW OF SYSTEMS: Lindsey Wright is doing moderately well today.  She does remain fatigued.  She notes that her fatigue is much worse after this cycle than prior.  She does c/o of numbness and tingling in the left tips of her first two toes that is constant.  She denies any numbness or tingling in her fingertips or in the toes of her right foot.  She denies any balance changes.    Lindsey Wright is doing well otherwise and denies nausea, vomiting, constipation, diarrhea, or any other issues.  A detailed ROS was otherwise non contributory.    HISTORY OF CURRENT ILLNESS: From the original intake note:  "Lindsey Wright" palpated a mass in the left breast about 2 weeks ago. She followed up with her gynecologist, Dr. Jean Rosenthal who referred her to the [facility] for mammography. She underwent unilateral left diagnostic mammography with tomography and left breast ultrasonography at [facility] on 12/20/2017 showing: breast density category B. Suspicious newly palpable left breast mass at the 5 o'clock lower outer position with internal blow flow measuring 2.3 x 1.8 x 2.5 cm. There are either 2 adjacent masses or a single  complicated mass at the 3:32 lower outer position, 4 cm from the nipple measuring 1.8 x 0.5 by 0.9 cm which may represent fibrocystic changes versus a solid mass. No other suspicious findings. Ultrasound showed normal axillary nodes.  Accordingly on 12/23/2017 she proceeded to biopsy of the left breast areas in question. The pathology from this procedure showed ([id number]): At both the 5 o'clock spanning 1.2 cm and 3:30 position spanning 0.7 cm, invasive ductal carcinoma, grade III. Prognostic indicators significant for: estrogen receptor, 10% positive with weak staining intensity and progesterone receptor, 0% negative. Proliferation marker Ki67 at 90%. HER2 amplified with ratios ER2/CEP17 signals 2.33 and average HER2 copies per cell 4.65  The patient's subsequent history is as detailed below.   PAST MEDICAL HISTORY: Past Medical History:  Diagnosis Date  . Anemia    iron deficient anemia  . Breast cancer ([place])   . History of Bell's palsy     x 2 as teenager and in 20's  . Hypertension   . Menorrhagia   . Overweight(278.02)     PAST SURGICAL HISTORY: Past Surgical History:  Procedure Laterality Date  . BREAST BIOPSY Right 02/01/2015   benign  . CESAREAN SECTION CLASSICAL    . CHOLECYSTECTOMY    . COSMETIC SURGERY    . PORTACATH PLACEMENT Right 01/17/2018   Procedure: INSERTION PORT-A-CATH;  Surgeon: [name], MD;  Location: [facility];  Service: General;  Laterality: Right;  . TUBAL LIGATION      FAMILY HISTORY Family History  Problem Relation Age of Onset  . Hypertension Mother   . Diabetes Mother   .  Stroke Mother   . Alcohol abuse Father   . Cancer Father        lung ca  . Hypertension Father   . Hypertension Sister   . Colon cancer Neg Hx   . Colon polyps Neg Hx   . Rectal cancer Neg Hx   . Stomach cancer Neg Hx    The patient's father was diagnosed with lung cancer at age 31 and died the same year. The patient's mother died at age 64 due to  several strokes. The patient had 1 brother who died due to strokes and possibly a MI. The patient has 1 sister. She denies a history of breast or ovarian cancer in the family.    GYNECOLOGIC HISTORY:  No LMP recorded. (Menstrual status: Perimenopausal). Menarche: 52 years old Age at first live birth: 52 years old The patient is GXP2. The patient is not having periods, with her LMP being in 2010. She used oral contraceptive for about 10 years with no complications. She never used HRT.    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 Her husband, [name], works part time in Therapist, art.  The patient's daughter [name] age 28, is a Ship broker and starting a job. The patient's daughter [name] age 53, is a Ship broker.  Both of the patient's daughters live with her.     ADVANCED DIRECTIVES:    HEALTH MAINTENANCE: Social History   Tobacco Use  . Smoking status: Never Smoker  . Smokeless tobacco: Never Used  Substance Use Topics  . Alcohol use: No    Alcohol/week: 0.0 standard drinks  . Drug use: No     Colonoscopy: 03/27/2017 polyp removal/ Dr. Deeann Saint  PAP: May 2017 normal  Bone density:   Allergies  Allergen Reactions  . Ace Inhibitors Cough    Current Outpatient Medications  Medication Sig Dispense Refill  . cholestyramine (QUESTRAN) 4 g packet Take 1 packet (4 g total) by mouth 2 (two) times daily. (Patient taking differently: Take 4 g by mouth 2 (two) times daily. ) 60 each 1  . dexamethasone (DECADRON) 4 MG tablet Take 2 tablets (8 mg total) by mouth 2 (two) times daily. Start the day before Taxotere. Take once the day after, then 2 times a day x 2d. 30 tablet 1  . diphenoxylate-atropine (LOMOTIL) 2.5-0.025 MG tablet Take 1 tablet by mouth 4 (four) times daily as needed for diarrhea or loose stools. 30 tablet 0  . lidocaine-prilocaine (EMLA) cream Apply to affected area once 30 g 3  . loperamide (LOPERAMIDE A-D) 2 MG tablet Take 4 mg by  mouth as needed for diarrhea or loose stools.    Marland Kitchen LORazepam (ATIVAN) 0.5 MG tablet Take 1 tablet (0.5 mg total) by mouth at bedtime as needed (Nausea or vomiting). 30 tablet 0  . losartan (COZAAR) 50 MG tablet TAKE 1 TABLET (50 MG TOTAL) BY MOUTH DAILY. (Patient taking differently: TAKE 1/2 TABLET (25 MG TOTAL) BY MOUTH DAILY.) 30 tablet 11  . ondansetron (ZOFRAN) 8 MG tablet Take 1 tablet (8 mg total) by mouth every 8 (eight) hours as needed for nausea or vomiting. 20 tablet 3  . potassium chloride SA (KLOR-CON M20) 20 MEQ tablet Take 2 tablets (40 mEq total) by mouth daily. 60 tablet 11  . prochlorperazine (COMPAZINE) 10 MG tablet Take 1 tablet (10 mg total) by mouth every 6 (six) hours as needed (Nausea or vomiting). 30 tablet 1   No current facility-administered medications for this visit.  OBJECTIVE: Vitals:   04/22/18 1043  BP: 134/63  Pulse: (!) 103  Resp: 18  Temp: 97.9 F (36.6 C)  SpO2: 99%     Body mass index is 30.22 kg/m.   Wt Readings from Last 3 Encounters:  04/22/18 190 lb 1.6 oz (86.2 kg)  04/02/18 189 lb 3.2 oz (85.8 kg)  03/11/18 185 lb 6.4 oz (84.1 kg)  ECOG FS:1 GENERAL: Patient is a well appearing female in no acute distress HEENT:  Sclerae anicteric.  Oropharynx clear and moist. No ulcerations or evidence of oropharyngeal candidiasis. Neck is supple.  NODES:  No cervical, supraclavicular, or axillary lymphadenopathy palpated.  BREAST EXAM:  Deferred. LUNGS:  Clear to auscultation bilaterally.  No wheezes or rhonchi. HEART:  Regular rate and rhythm. No murmur appreciated. ABDOMEN:  Soft, nontender.  Positive, normoactive bowel sounds. No organomegaly palpated. MSK:  No focal spinal tenderness to palpation. Full range of motion bilaterally in the upper extremities. EXTREMITIES:  No peripheral edema.   SKIN:  Clear with no obvious rashes or skin changes. No nail dyscrasia. NEURO:  Nonfocal. Well oriented.  Appropriate affect.      LAB  RESULTS:  CMP     Component Value Date/Time   NA 140 04/22/2018 0952   K 3.5 04/22/2018 0952   CL 103 04/22/2018 0952   CO2 24 04/22/2018 0952   GLUCOSE 210 (H) 04/22/2018 0952   BUN 8 04/22/2018 0952   CREATININE 0.87 04/22/2018 0952   CALCIUM 8.8 (L) 04/22/2018 0952   PROT 6.2 (L) 04/22/2018 0952   ALBUMIN 3.6 04/22/2018 0952   AST 14 (L) 04/22/2018 0952   ALT 14 04/22/2018 0952   ALKPHOS 125 04/22/2018 0952   BILITOT 0.3 04/22/2018 0952   GFRNONAA >60 04/22/2018 0952   GFRAA >60 04/22/2018 0952    No results found for: TOTALPROTELP, ALBUMINELP, A1GS, A2GS, BETS, BETA2SER, GAMS, MSPIKE, SPEI  No results found for: KPAFRELGTCHN, LAMBDASER, KAPLAMBRATIO  Lab Results  Component Value Date   WBC 24.9 (H) 04/22/2018   NEUTROABS 20.8 (H) 04/22/2018   HGB 11.2 (L) 04/22/2018   HCT 34.3 (L) 04/22/2018   MCV 97.7 04/22/2018   PLT 158 04/22/2018    '@LASTCHEMISTRY'$ @  No results found for: LABCA2  No components found for: QVZDGL875  No results for input(s): INR in the last 168 hours.  No results found for: LABCA2  No results found for: IEP329  No results found for: JJO841  No results found for: YSA630  No results found for: CA2729  No components found for: HGQUANT  No results found for: CEA1 / No results found for: CEA1   No results found for: AFPTUMOR  No results found for: CHROMOGRNA  No results found for: PSA1  Appointment on 04/22/2018  Component Date Value Ref Range Status  . Sodium 04/22/2018 140  135 - 145 mmol/L Final  . Potassium 04/22/2018 3.5  3.5 - 5.1 mmol/L Final  . Chloride 04/22/2018 103  98 - 111 mmol/L Final  . CO2 04/22/2018 24  22 - 32 mmol/L Final  . Glucose, Bld 04/22/2018 210* 70 - 99 mg/dL Final  . BUN 04/22/2018 8  6 - 20 mg/dL Final  . Creatinine 04/22/2018 0.87  0.44 - 1.00 mg/dL Final  . Calcium 04/22/2018 8.8* 8.9 - 10.3 mg/dL Final  . Total Protein 04/22/2018 6.2* 6.5 - 8.1 g/dL Final  . Albumin 04/22/2018 3.6  3.5 - 5.0  g/dL Final  . AST 04/22/2018 14* 15 - 41 U/L Final  .  ALT 04/22/2018 14  0 - 44 U/L Final  . Alkaline Phosphatase 04/22/2018 125  38 - 126 U/L Final  . Total Bilirubin 04/22/2018 0.3  0.3 - 1.2 mg/dL Final  . GFR, Est Non Af Am 04/22/2018 >60  >60 mL/min Final  . GFR, Est AFR Am 04/22/2018 >60  >60 mL/min Final   Comment: (NOTE) The eGFR has been calculated using the CKD EPI equation. This calculation has not been validated in all clinical situations. eGFR's persistently <60 mL/min signify possible Chronic Kidney Disease.   Georgiann Hahn gap 04/22/2018 13  5 - 15 Final   Performed at [facility], [address], [place], [place] [address]  . WBC Count 04/22/2018 24.9* 3.9 - 10.3 K/uL Final  . RBC 04/22/2018 3.51* 3.70 - 5.45 MIL/uL Final  . Hemoglobin 04/22/2018 11.2* 11.6 - 15.9 g/dL Final  . HCT 04/22/2018 34.3* 34.8 - 46.6 % Final  . MCV 04/22/2018 97.7  79.5 - 101.0 fL Final  . MCH 04/22/2018 31.8  25.1 - 34.0 pg Final  . MCHC 04/22/2018 32.6  31.5 - 36.0 g/dL Final  . RDW 04/22/2018 18.9* 11.2 - 14.5 % Final  . Platelet Count 04/22/2018 158  145 - 400 K/uL Final  . Neutrophils Relative % 04/22/2018 83  % Final  . Neutro Abs 04/22/2018 20.8* 1.5 - 6.5 K/uL Final  . Lymphocytes Relative 04/22/2018 11  % Final  . Lymphs Abs 04/22/2018 2.6  0.9 - 3.3 K/uL Final  . Monocytes Relative 04/22/2018 6  % Final  . Monocytes Absolute 04/22/2018 1.4* 0.1 - 0.9 K/uL Final  . Eosinophils Relative 04/22/2018 0  % Final  . Eosinophils Absolute 04/22/2018 0.0  0.0 - 0.5 K/uL Final  . Basophils Relative 04/22/2018 0  % Final  . Basophils Absolute 04/22/2018 0.1  0.0 - 0.1 K/uL Final   Performed at [facility], [address] [place], [place] [address]    (this displays the last labs from the last 3 days)  No results found for: TOTALPROTELP, ALBUMINELP, A1GS, A2GS, BETS, BETA2SER, GAMS, MSPIKE, SPEI (this displays SPEP labs)  No results found  for: KPAFRELGTCHN, LAMBDASER, KAPLAMBRATIO (kappa/lambda light chains)  No results found for: HGBA, HGBA2QUANT, HGBFQUANT, HGBSQUAN (Hemoglobinopathy evaluation)   No results found for: LDH  Lab Results  Component Value Date   IRON 104 09/27/2008   IRONPCTSAT 25.1 09/27/2008   (Iron and TIBC)  No results found for: FERRITIN  Urinalysis    Component Value Date/Time   COLORURINE YELLOW 02/15/2018 2014   APPEARANCEUR CLEAR 02/15/2018 2014   LABSPEC 1.011 02/15/2018 2014   PHURINE 7.0 02/15/2018 2014   [place] 02/15/2018 2014   [place] (A) 02/15/2018 2014   [place] NEGATIVE 02/15/2018 2014   [place] NEGATIVE 02/15/2018 2014   [place] NEGATIVE 02/15/2018 2014   NITR[place] NEGATIVE 02/15/2018 2014   LEUKOCYTESUR NEGATIVE 02/15/2018 2014     STUDIES: No results found.  ELIGIBLE FOR AVAILABLE RESEARCH PROTOCOL: Exact signs of study  ASSESSMENT: 52 y.o.  [place], [place] woman status post left breast lower outer quadrant biopsy 12/23/2017 for a clinically multifocal T2 N0, stage II invasive ductal carcinoma, grade 3, essentially estrogen receptor and progesterone receptor negative, but HER-2 amplified, with an MIB-1 of 90%.  (a) breast MRI 01/07/2018 shows the 2.6 cm main mass and 4 additional smaller masses spanning 7.1 cm; there was a suspicious left axillary lymph node  (b) left axillary lymph node biopsy 01/14/2018 was benign/discordant (no  lymph node tissue)  (1) neoadjuvant chemotherapy will consist of carboplatin and docetaxel every 21 days for 6 cycles starting 01/20/2018  (2) anti-HER-2 immunotherapy will consist of trastuzumab and Pertuzumab, to be continued for 6 months, starting concurrently with chemotherapy (pertuzumab discontinued after cycle 2 due to diarrhea)  (a) baseline echocardiogram 01/08/2018 shows an ejection fraction in the 60-65% range  (3) definitive surgery pending  (4) adjuvant radiation as appropriate  (5) consider  antiestrogens given the weak receptor positivity  PLAN:  Lindsey Wright is doing moderately well today.  Her CBC is stable and I reviewed it with her in detail.  (CMET pending at completion of visit).  She and I reviewed fatigue and I recommended she remain as active as possible to help with the fatigue.  She will keep an eye on the numbness and whether or not it has improved by her next cycle on 8/27.  I reviewed with her, the upcoming plans regarding her last cycle of chemotherapy, her f/u MRI and f/u with her surgeon, along with the role of maintenance Trastuzumab in her treatment.    She knows to call for any other issues that may develop before the next visit.  A total of (30) minutes of face-to-face time was spent with this patient with greater than 50% of that time in counseling and care-coordination.   Wilber Bihari, NP  04/22/18 1:10 PM Medical Oncology and Hematology [facility] [address] [place], [place] [address] Tel. [phone]    Fax. [phone]

## 2018-04-24 ENCOUNTER — Ambulatory Visit (HOSPITAL_COMMUNITY)
Admission: RE | Admit: 2018-04-24 | Discharge: 2018-04-24 | Disposition: A | Discharge status: Home / Self Care | Payer: 59 | Source: Ambulatory Visit | Attending: Oncology | Admitting: Oncology

## 2018-04-24 ENCOUNTER — Ambulatory Visit (HOSPITAL_BASED_OUTPATIENT_CLINIC_OR_DEPARTMENT_OTHER)
Admission: RE | Admit: 2018-04-24 | Discharge: 2018-04-24 | Disposition: A | Discharge status: Home / Self Care | Payer: 59 | Source: Ambulatory Visit | Attending: Internal Medicine | Admitting: Internal Medicine

## 2018-04-24 ENCOUNTER — Telehealth: Payer: Self-pay | Admitting: Adult Health

## 2018-04-24 ENCOUNTER — Other Ambulatory Visit: Payer: Self-pay

## 2018-04-24 ENCOUNTER — Encounter (HOSPITAL_COMMUNITY): Payer: Self-pay | Admitting: Internal Medicine

## 2018-04-24 VITALS — BP 136/76 | HR 95 | Wt 192.0 lb

## 2018-04-24 DIAGNOSIS — E669 Obesity, unspecified: Secondary | ICD-10-CM | POA: Diagnosis not present

## 2018-04-24 DIAGNOSIS — C50512 Malignant neoplasm of lower-outer quadrant of left female breast: Secondary | ICD-10-CM

## 2018-04-24 DIAGNOSIS — R55 Syncope and collapse: Secondary | ICD-10-CM | POA: Diagnosis not present

## 2018-04-24 DIAGNOSIS — R0683 Snoring: Secondary | ICD-10-CM | POA: Diagnosis not present

## 2018-04-24 DIAGNOSIS — I509 Heart failure, unspecified: Secondary | ICD-10-CM | POA: Insufficient documentation

## 2018-04-24 DIAGNOSIS — I340 Nonrheumatic mitral (valve) insufficiency: Secondary | ICD-10-CM | POA: Diagnosis not present

## 2018-04-24 DIAGNOSIS — Z170 Estrogen receptor positive status [ER+]: Secondary | ICD-10-CM

## 2018-04-24 DIAGNOSIS — Z6830 Body mass index (BMI) 30.0-30.9, adult: Secondary | ICD-10-CM | POA: Diagnosis not present

## 2018-04-24 DIAGNOSIS — C50212 Malignant neoplasm of upper-inner quadrant of left female breast: Secondary | ICD-10-CM | POA: Insufficient documentation

## 2018-04-24 DIAGNOSIS — I110 Hypertensive heart disease with heart failure: Secondary | ICD-10-CM | POA: Diagnosis not present

## 2018-04-24 NOTE — Telephone Encounter (Signed)
Per 8/12 no los °

## 2018-04-24 NOTE — Progress Notes (Signed)
  Echocardiogram 2D Echocardiogram has been performed.  Laurren Lepkowski L Androw 04/24/2018, 2:47 PM

## 2018-04-24 NOTE — Progress Notes (Signed)
Cardio-Oncology Clinic Consult Note   Referring Physician: Dr. Jana Hakim Primary Care: Dr. Wynelle Fanny Tower  HPI:  Lindsey Wright is a 52 y.o. female with past medical history of HTN, HER-2 positive breast cancer who has been referred by Dr. Jana Hakim to establish in the cardio-oncology clinic for monitoring of cardio-toxicity while undergoing chemotherapy.  Cancer Profile  Left breast, lower outer quadrant    12/23/17 Initial Diagnosis          Breast MRI 01/07/2018   2.6 cm main mass and 4 additional smaller masses spanning 7.1 cm; there was a suspicious left axillary lymph node    Left Axillary lymph node biopsy 01/14/2018 Benign/discordant (no lymph node tissue.    Treatment Plan  (1) Neoadjuvant chemotherapy will consist of carboplatin and docetaxel every 21 days for 6 cycles starting 01/20/2018 (2) Anti-HER-2 immunotherapy will consist of trastuzumab and Pertuzumab, to be continued for 6 months, starting concurrently with chemotherapy (pertuzumab discontinued after cycle 2 due to diarrhea)             (a) baseline echocardiogram 01/08/2018 shows an ejection fraction in the 60-65% range (3) Definitive surgery pending (4) Adjuvant radiation as appropriate (5) Consider antiestrogens given the weak receptor positivity  She denies any h/o of known heart disease. Works for [employer]. Used to walk regularly. Has been fatigued with chemo. No CP or SOB. No edema. She does snore.   Echo today shows  65-70% Moderate LVH  Grade 1 DD Personally reviewed   Echo 01/08/2018 LVEF 60-65%, Grade 1 DD, GLS -19%. Normal LV size, severe LVH.  Review of Systems: [y] = yes, '[ ]'$  = no   . General: Weight gain '[ ]'$ ; Weight loss '[ ]'$ ; Anorexia '[ ]'$ ; Fatigue Blue.Reese ]; Fever '[ ]'$ ; Chills '[ ]'$ ; Weakness '[ ]'$   . Cardiac: Chest pain/pressure '[ ]'$ ; Resting SOB '[ ]'$ ; Exertional SOB '[ ]'$ ; Orthopnea '[ ]'$ ; Pedal Edema '[ ]'$ ; Palpitations '[ ]'$ ; Syncope '[ ]'$ ; Presyncope '[ ]'$ ; Paroxysmal nocturnal dyspnea'[ ]'$    . Pulmonary: Cough '[ ]'$ ; Wheezing'[ ]'$ ; Hemoptysis'[ ]'$ ; Sputum '[ ]'$ ; Snoring [ y]  . GI: Vomiting'[ ]'$ ; Dysphagia'[ ]'$ ; Melena'[ ]'$ ; Hematochezia '[ ]'$ ; Heartburn'[ ]'$ ; Abdominal pain '[ ]'$ ; Constipation '[ ]'$ ; Diarrhea '[ ]'$ ; BRBPR '[ ]'$   . GU: Hematuria'[ ]'$ ; Dysuria '[ ]'$ ; Nocturia'[ ]'$   . Vascular: Pain in legs with walking '[ ]'$ ; Pain in feet with lying flat '[ ]'$ ; Non-healing sores '[ ]'$ ; Stroke '[ ]'$ ; TIA '[ ]'$ ; Slurred speech '[ ]'$ ;  . Neuro: Headaches'[ ]'$ ; Vertigo'[ ]'$ ; Seizures'[ ]'$ ; Paresthesias'[ ]'$ ;Blurred vision '[ ]'$ ; Diplopia '[ ]'$ ; Vision changes '[ ]'$   . Ortho/Skin: Arthritis [ y]; Joint pain Blue.Reese ]; Muscle pain '[ ]'$ ; Joint swelling '[ ]'$ ; Back Pain '[ ]'$ ; Rash '[ ]'$   . Psych: Depression'[ ]'$ ; Anxiety'[ ]'$   . Heme: Bleeding problems '[ ]'$ ; Clotting disorders '[ ]'$ ; Anemia [ y]  . Endocrine: Diabetes '[ ]'$ ; Thyroid dysfunction'[ ]'$   Past Medical History:  Diagnosis Date  . Anemia    iron deficient anemia  . Breast cancer ([place])   . History of Bell's palsy     x 2 as teenager and in 20's  . Hypertension   . Menorrhagia   . Overweight(278.02)    Current Outpatient Medications  Medication Sig Dispense Refill  . cholestyramine (QUESTRAN) 4 g packet Take 1 packet (4 g total) by mouth 2 (two) times daily. (Patient taking differently: Take 4 g by mouth 2 (two) times  daily. ) 60 each 1  . dexamethasone (DECADRON) 4 MG tablet Take 2 tablets (8 mg total) by mouth 2 (two) times daily. Start the day before Taxotere. Take once the day after, then 2 times a day x 2d. 30 tablet 1  . diphenoxylate-atropine (LOMOTIL) 2.5-0.025 MG tablet Take 1 tablet by mouth 4 (four) times daily as needed for diarrhea or loose stools. 30 tablet 0  . lidocaine-prilocaine (EMLA) cream Apply to affected area once 30 g 3  . loperamide (LOPERAMIDE A-D) 2 MG tablet Take 4 mg by mouth as needed for diarrhea or loose stools.    Marland Kitchen LORazepam (ATIVAN) 0.5 MG tablet Take 1 tablet (0.5 mg total) by mouth at bedtime as needed (Nausea or vomiting). 30 tablet 0  . losartan (COZAAR) 50 MG  tablet Take 25 mg by mouth daily.    . ondansetron (ZOFRAN) 8 MG tablet Take 1 tablet (8 mg total) by mouth every 8 (eight) hours as needed for nausea or vomiting. 20 tablet 3  . potassium chloride SA (KLOR-CON M20) 20 MEQ tablet Take 2 tablets (40 mEq total) by mouth daily. 60 tablet 11  . prochlorperazine (COMPAZINE) 10 MG tablet Take 1 tablet (10 mg total) by mouth every 6 (six) hours as needed (Nausea or vomiting). 30 tablet 1   No current facility-administered medications for this encounter.    Allergies  Allergen Reactions  . Ace Inhibitors Cough   Social History   Socioeconomic History  . Marital status: Married    Spouse name: Not on file  . Number of children: Not on file  . Years of education: Not on file  . Highest education level: Not on file  Occupational History  . Not on file  Social Needs  . Financial resource strain: Not on file  . Food insecurity:    Worry: Not on file    Inability: Not on file  . Transportation needs:    Medical: Not on file    Non-medical: Not on file  Tobacco Use  . Smoking status: Never Smoker  . Smokeless tobacco: Never Used  Substance and Sexual Activity  . Alcohol use: No    Alcohol/week: 0.0 standard drinks  . Drug use: No  . Sexual activity: Not Currently  Lifestyle  . Physical activity:    Days per week: Not on file    Minutes per session: Not on file  . Stress: Not on file  Relationships  . Social connections:    Talks on phone: Not on file    Gets together: Not on file    Attends religious service: Not on file    Active member of club or organization: Not on file    Attends meetings of clubs or organizations: Not on file    Relationship status: Not on file  . Intimate partner violence:    Fear of current or ex partner: Not on file    Emotionally abused: Not on file    Physically abused: Not on file    Forced sexual activity: Not on file  Other Topics Concern  . Not on file  Social History Narrative  . Not on file    Family History  Problem Relation Age of Onset  . Hypertension Mother   . Diabetes Mother   . Stroke Mother   . Alcohol abuse Father   . Cancer Father        lung ca  . Hypertension Father   . Hypertension Sister   . Colon cancer  Neg Hx   . Colon polyps Neg Hx   . Rectal cancer Neg Hx   . Stomach cancer Neg Hx    Vitals:   04/24/18 1511  BP: 136/76  Pulse: 95  SpO2: 100%  Weight: 87.1 kg (192 lb)   PHYSICAL EXAM: General:  Well appearing. No respiratory difficulty HEENT: normal Neck: supple. no JVD. Carotids 2+ bilat; no bruits. No lymphadenopathy or thyromegaly appreciated. Cor: PMI nondisplaced. Regular rate & rhythm. No rubs, gallops or murmurs. Lungs: clear Abdomen: obese soft, nontender, nondistended. No hepatosplenomegaly. No bruits or masses. Good bowel sounds. Extremities: no cyanosis, clubbing, rash, edema Neuro: alert & oriented x 3, cranial nerves grossly intact. moves all 4 extremities w/o difficulty. Affect pleasant.  ASSESSMENT & PLAN:  1. HER-2 positive breast cancer, Left breast, lower outer quadrant - Explained incidence of Herceptin cardiotoxicity and role of Cardio-oncology clinic at length. Echo images reviewed personally. All parameters stable. Reviewed signs and symptoms of HF to look for. Continue Herceptin. Follow-up with echo in 3 months. 2. Snoring - will need sleep study  Glori Bickers, MD  10:47 PM

## 2018-05-02 ENCOUNTER — Encounter: Payer: Self-pay | Admitting: *Deleted

## 2018-05-05 ENCOUNTER — Inpatient Hospital Stay: Payer: 59

## 2018-05-05 ENCOUNTER — Encounter: Payer: Self-pay | Admitting: *Deleted

## 2018-05-05 ENCOUNTER — Ambulatory Visit: Payer: 59

## 2018-05-05 ENCOUNTER — Inpatient Hospital Stay (HOSPITAL_BASED_OUTPATIENT_CLINIC_OR_DEPARTMENT_OTHER): Payer: 59 | Admitting: Adult Health

## 2018-05-05 ENCOUNTER — Other Ambulatory Visit: Payer: 59

## 2018-05-05 ENCOUNTER — Telehealth: Payer: Self-pay | Admitting: Adult Health

## 2018-05-05 ENCOUNTER — Encounter: Payer: Self-pay | Admitting: Adult Health

## 2018-05-05 VITALS — BP 160/81 | HR 93 | Temp 98.0°F | Resp 18 | Ht 66.5 in | Wt 202.2 lb

## 2018-05-05 DIAGNOSIS — I10 Essential (primary) hypertension: Secondary | ICD-10-CM

## 2018-05-05 DIAGNOSIS — Z801 Family history of malignant neoplasm of trachea, bronchus and lung: Secondary | ICD-10-CM

## 2018-05-05 DIAGNOSIS — C50512 Malignant neoplasm of lower-outer quadrant of left female breast: Secondary | ICD-10-CM | POA: Diagnosis not present

## 2018-05-05 DIAGNOSIS — Z79899 Other long term (current) drug therapy: Secondary | ICD-10-CM

## 2018-05-05 DIAGNOSIS — D509 Iron deficiency anemia, unspecified: Secondary | ICD-10-CM

## 2018-05-05 DIAGNOSIS — Z170 Estrogen receptor positive status [ER+]: Secondary | ICD-10-CM

## 2018-05-05 DIAGNOSIS — G629 Polyneuropathy, unspecified: Secondary | ICD-10-CM

## 2018-05-05 DIAGNOSIS — Z7689 Persons encountering health services in other specified circumstances: Secondary | ICD-10-CM

## 2018-05-05 DIAGNOSIS — Z5112 Encounter for antineoplastic immunotherapy: Secondary | ICD-10-CM | POA: Diagnosis not present

## 2018-05-05 DIAGNOSIS — N920 Excessive and frequent menstruation with regular cycle: Secondary | ICD-10-CM

## 2018-05-05 DIAGNOSIS — Z5111 Encounter for antineoplastic chemotherapy: Secondary | ICD-10-CM

## 2018-05-05 LAB — CMP (CANCER CENTER ONLY)
ALBUMIN: 3.5 g/dL (ref 3.5–5.0)
ALT: 23 U/L (ref 0–44)
ANION GAP: 10 (ref 5–15)
AST: 17 U/L (ref 15–41)
Alkaline Phosphatase: 85 U/L (ref 38–126)
BILIRUBIN TOTAL: 0.4 mg/dL (ref 0.3–1.2)
BUN: 11 mg/dL (ref 6–20)
CO2: 25 mmol/L (ref 22–32)
Calcium: 9.3 mg/dL (ref 8.9–10.3)
Chloride: 105 mmol/L (ref 98–111)
Creatinine: 0.89 mg/dL (ref 0.44–1.00)
GFR, Est AFR Am: >60 mL/min (ref >60)
GFR, Estimated: >60 mL/min (ref >60)
Glucose, Bld: 246 mg/dL — ABNORMAL HIGH (ref 70–99)
POTASSIUM: 3.9 mmol/L (ref 3.5–5.1)
SODIUM: 140 mmol/L (ref 135–145)
TOTAL PROTEIN: 6.2 g/dL — AB (ref 6.5–8.1)

## 2018-05-05 LAB — CBC WITH DIFFERENTIAL (CANCER CENTER ONLY)
BASOS PCT: 0 %
Basophils Absolute: 0 10*3/uL (ref 0.0–0.1)
EOS ABS: 0 10*3/uL (ref 0.0–0.5)
EOS PCT: 0 %
HCT: 34 % — ABNORMAL LOW (ref 34.8–46.6)
Hemoglobin: 11 g/dL — ABNORMAL LOW (ref 11.6–15.9)
Lymphocytes Relative: 5 %
Lymphs Abs: 1 10*3/uL (ref 0.9–3.3)
MCH: 32.8 pg (ref 25.1–34.0)
MCHC: 32.4 g/dL (ref 31.5–36.0)
MCV: 101.5 fL — ABNORMAL HIGH (ref 79.5–101.0)
MONO ABS: 1 10*3/uL — AB (ref 0.1–0.9)
Monocytes Relative: 5 %
Neutro Abs: 17.8 10*3/uL — ABNORMAL HIGH (ref 1.5–6.5)
Neutrophils Relative %: 90 %
Platelet Count: 278 10*3/uL (ref 145–400)
RBC: 3.35 MIL/uL — ABNORMAL LOW (ref 3.70–5.45)
RDW: 18.1 % — AB (ref 11.2–14.5)
WBC Count: 19.8 10*3/uL — ABNORMAL HIGH (ref 3.9–10.3)

## 2018-05-05 MED ORDER — SODIUM CHLORIDE 0.9 % IV SOLN
Freq: Once | INTRAVENOUS | Status: AC
  Administered 2018-05-05: 12:00:00 via INTRAVENOUS
  Filled 2018-05-05: qty 250

## 2018-05-05 MED ORDER — SODIUM CHLORIDE 0.9 % IV SOLN
632.5000 mg | Freq: Once | INTRAVENOUS | Status: AC
  Administered 2018-05-05: 630 mg via INTRAVENOUS
  Filled 2018-05-05: qty 63

## 2018-05-05 MED ORDER — PALONOSETRON HCL INJECTION 0.25 MG/5ML
INTRAVENOUS | Status: AC
  Filled 2018-05-05: qty 5

## 2018-05-05 MED ORDER — PALONOSETRON HCL INJECTION 0.25 MG/5ML
0.2500 mg | Freq: Once | INTRAVENOUS | Status: AC
  Administered 2018-05-05: 0.25 mg via INTRAVENOUS

## 2018-05-05 MED ORDER — DIPHENHYDRAMINE HCL 25 MG PO CAPS
50.0000 mg | ORAL_CAPSULE | Freq: Once | ORAL | Status: AC
  Administered 2018-05-05: 50 mg via ORAL

## 2018-05-05 MED ORDER — DIPHENHYDRAMINE HCL 25 MG PO CAPS
ORAL_CAPSULE | ORAL | Status: AC
  Filled 2018-05-05: qty 2

## 2018-05-05 MED ORDER — PEGFILGRASTIM 6 MG/0.6ML ~~LOC~~ PSKT
6.0000 mg | PREFILLED_SYRINGE | Freq: Once | SUBCUTANEOUS | Status: AC
  Administered 2018-05-05: 6 mg via SUBCUTANEOUS

## 2018-05-05 MED ORDER — PEGFILGRASTIM INJECTION 6 MG/0.6ML ~~LOC~~
PREFILLED_SYRINGE | SUBCUTANEOUS | Status: AC
  Filled 2018-05-05: qty 0.6

## 2018-05-05 MED ORDER — SODIUM CHLORIDE 0.9 % IV SOLN
67.0000 mg/m2 | Freq: Once | INTRAVENOUS | Status: AC
  Administered 2018-05-05: 140 mg via INTRAVENOUS
  Filled 2018-05-05: qty 14

## 2018-05-05 MED ORDER — PEGFILGRASTIM 6 MG/0.6ML ~~LOC~~ PSKT
PREFILLED_SYRINGE | SUBCUTANEOUS | Status: AC
  Filled 2018-05-05: qty 0.6

## 2018-05-05 MED ORDER — HEPARIN SOD (PORK) LOCK FLUSH 100 UNIT/ML IV SOLN
500.0000 [IU] | Freq: Once | INTRAVENOUS | Status: AC | PRN
  Administered 2018-05-05: 500 [IU]
  Filled 2018-05-05: qty 5

## 2018-05-05 MED ORDER — ACETAMINOPHEN 325 MG PO TABS
650.0000 mg | ORAL_TABLET | Freq: Once | ORAL | Status: AC
  Administered 2018-05-05: 650 mg via ORAL

## 2018-05-05 MED ORDER — ACETAMINOPHEN 325 MG PO TABS
ORAL_TABLET | ORAL | Status: AC
  Filled 2018-05-05: qty 2

## 2018-05-05 MED ORDER — SODIUM CHLORIDE 0.9 % IV SOLN
Freq: Once | INTRAVENOUS | Status: AC
  Administered 2018-05-05: 13:00:00 via INTRAVENOUS
  Filled 2018-05-05: qty 5

## 2018-05-05 MED ORDER — SODIUM CHLORIDE 0.9% FLUSH
10.0000 mL | INTRAVENOUS | Status: DC | PRN
  Administered 2018-05-05: 10 mL
  Filled 2018-05-05: qty 10

## 2018-05-05 MED ORDER — TRASTUZUMAB CHEMO 150 MG IV SOLR
6.0000 mg/kg | Freq: Once | INTRAVENOUS | Status: AC
  Administered 2018-05-05: 525 mg via INTRAVENOUS
  Filled 2018-05-05: qty 25

## 2018-05-05 NOTE — Telephone Encounter (Signed)
Gave avs and calendar ° °

## 2018-05-05 NOTE — Progress Notes (Signed)
[place]  [phone] Fax:([phone]) [phone]     ID: Lindsey Wright DOB: Jan 05, 1966  MR#: [id number]  TGY#:[id number]  Patient Care Team: [name], MD as PCP - General Mag[place], [name], MD as Consulting Physician (Oncology) Vania Rea, MD as Consulting Physician (Obstetrics and Gynecology) [name], MD as Consulting Physician (General Surgery) Kyung Rudd, MD as Consulting Physician (Radiation Oncology) OTHER MD:   CHIEF COMPLAINT: HER-2 positive breast cancer  CURRENT TREATMENT: Neoadjuvant chemotherapy  INTERVAL HISTORY: Lindsey Wright returns today for a follow-up and treatment of her HER-2 positive breast cancer.    Today is day 1 cycle 6 of 6 planned cycles of carboplatin, docetaxel, trastuzumab, and pertuzumab, given 21 days apart.   REVIEW OF SYSTEMS: Lindsey Wright is doing well today.  She is here for her final chemotherapy treatment.  She saw Dr. Haroldine Laws and underwent cardiac eval with echocardiogram on 04/24/2018.  Her LVEF was 65-70%.    She has mild constant numbness in her left first and second toe.  She denies any numbness or tingling anywhere else.  She denies any balance issues.  She notes nail changes in all of her nails.   Lindsey Wright is doing well otherwise and denies any fevers, chills, chest pain, palpitations, cough, shortness of breath, swelling, mucositis, headaches, vision change, or any other concerns.  A detailed ROS is otherwise non contributory.    HISTORY OF CURRENT ILLNESS: From the original intake note:  "Lindsey Wright" palpated a mass in the left breast about 2 weeks ago. She followed up with her gynecologist, Dr. Jean Wright who referred her to the [facility] for mammography. She underwent unilateral left diagnostic mammography with tomography and left breast ultrasonography at [facility] on 12/20/2017 showing: breast density category B. Suspicious newly palpable left breast mass at the 5 o'clock lower outer position  with internal blow flow measuring 2.3 x 1.8 x 2.5 cm. There are either 2 adjacent masses or a single complicated mass at the 2:87 lower outer position, 4 cm from the nipple measuring 1.8 x 0.5 by 0.9 cm which may represent fibrocystic changes versus a solid mass. No other suspicious findings. Ultrasound showed normal axillary nodes.  Accordingly on 12/23/2017 she proceeded to biopsy of the left breast areas in question. The pathology from this procedure showed ([id number]): At both the 5 o'clock spanning 1.2 cm and 3:30 position spanning 0.7 cm, invasive ductal carcinoma, grade III. Prognostic indicators significant for: estrogen receptor, 10% positive with weak staining intensity and progesterone receptor, 0% negative. Proliferation marker Ki67 at 90%. HER2 amplified with ratios ER2/CEP17 signals 2.33 and average HER2 copies per cell 4.65  The patient's subsequent history is as detailed below.   PAST MEDICAL HISTORY: Past Medical History:  Diagnosis Date  . Anemia    iron deficient anemia  . Breast cancer ([place])   . History of Bell's palsy     x 2 as teenager and in 20's  . Hypertension   . Menorrhagia   . Overweight(278.02)     PAST SURGICAL HISTORY: Past Surgical History:  Procedure Laterality Date  . BREAST BIOPSY Right 02/01/2015   benign  . CESAREAN SECTION CLASSICAL    . CHOLECYSTECTOMY    . COSMETIC SURGERY    . PORTACATH PLACEMENT Right 01/17/2018   Procedure: INSERTION PORT-A-CATH;  Surgeon: [name], MD;  Location: [facility];  Service: General;  Laterality: Right;  . TUBAL LIGATION      FAMILY HISTORY Family History  Problem  Relation Age of Onset  . Hypertension Mother   . Diabetes Mother   . Stroke Mother   . Alcohol abuse Father   . Cancer Father        lung ca  . Hypertension Father   . Hypertension Sister   . Colon cancer Neg Hx   . Colon polyps Neg Hx   . Rectal cancer Neg Hx   . Stomach cancer Neg Hx    The patient's father was  diagnosed with lung cancer at age 43 and died the same year. The patient's mother died at age 57 due to several strokes. The patient had 1 brother who died due to strokes and possibly a MI. The patient has 1 sister. She denies a history of breast or ovarian cancer in the family.    GYNECOLOGIC HISTORY:  No LMP recorded. (Menstrual status: Perimenopausal). Menarche: 52 years old Age at first live birth: 52 years old The patient is GXP2. The patient is not having periods, with her LMP being in 2010. She used oral contraceptive for about 10 years with no complications. She never used HRT.    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 Her husband, Lindsey Wright, works part time in Therapist, art.  The patient's daughter Lindsey Wright age 61, is a Ship broker and starting a job. The patient's daughter Lindsey Wright age 68, is a Ship broker.  Both of the patient's daughters live with her.      ADVANCED DIRECTIVES:    HEALTH MAINTENANCE: Social History   Tobacco Use  . Smoking status: Never Smoker  . Smokeless tobacco: Never Used  Substance Use Topics  . Alcohol use: No    Alcohol/week: 0.0 standard drinks  . Drug use: No     Colonoscopy: 03/27/2017 polyp removal/ Dr. Deeann Saint  PAP: May 2017 normal  Bone density:   Allergies  Allergen Reactions  . Ace Inhibitors Cough    Current Outpatient Medications  Medication Sig Dispense Refill  . cholestyramine (QUESTRAN) 4 g packet Take 1 packet (4 g total) by mouth 2 (two) times daily. (Patient taking differently: Take 4 g by mouth 2 (two) times daily. ) 60 each 1  . dexamethasone (DECADRON) 4 MG tablet Take 2 tablets (8 mg total) by mouth 2 (two) times daily. Start the day before Taxotere. Take once the day after, then 2 times a day x 2d. 30 tablet 1  . diphenoxylate-atropine (LOMOTIL) 2.5-0.025 MG tablet Take 1 tablet by mouth 4 (four) times daily as needed for diarrhea or loose stools. 30 tablet 0  . lidocaine-prilocaine  (EMLA) cream Apply to affected area once 30 g 3  . loperamide (LOPERAMIDE A-D) 2 MG tablet Take 4 mg by mouth as needed for diarrhea or loose stools.    Marland Kitchen LORazepam (ATIVAN) 0.5 MG tablet Take 1 tablet (0.5 mg total) by mouth at bedtime as needed (Nausea or vomiting). 30 tablet 0  . losartan (COZAAR) 50 MG tablet Take 25 mg by mouth daily.    . ondansetron (ZOFRAN) 8 MG tablet Take 1 tablet (8 mg total) by mouth every 8 (eight) hours as needed for nausea or vomiting. 20 tablet 3  . potassium chloride SA (KLOR-CON M20) 20 MEQ tablet Take 2 tablets (40 mEq total) by mouth daily. 60 tablet 11  . prochlorperazine (COMPAZINE) 10 MG tablet Take 1 tablet (10 mg total) by mouth every 6 (six) hours as needed (Nausea or vomiting). 30 tablet 1   No current facility-administered medications for this  visit.     OBJECTIVE: Vitals:   05/05/18 1028  BP: (!) 160/81  Pulse: 93  Resp: 18  Temp: 98 F (36.7 C)  SpO2: 98%     Body mass index is 32.15 kg/m.   Wt Readings from Last 3 Encounters:  05/05/18 202 lb 3.2 oz (91.7 kg)  04/24/18 192 lb (87.1 kg)  04/22/18 190 lb 1.6 oz (86.2 kg)  ECOG FS:1 GENERAL: Patient is a well appearing female in no acute distress HEENT:  Sclerae anicteric.  Oropharynx clear and moist. No ulcerations or evidence of oropharyngeal candidiasis. Neck is supple.  NODES:  No cervical, supraclavicular, or axillary lymphadenopathy palpated.  BREAST EXAM:  Deferred. LUNGS:  Clear to auscultation bilaterally.  No wheezes or rhonchi. HEART:  Regular rate and rhythm. No murmur appreciated. ABDOMEN:  Soft, nontender.  Positive, normoactive bowel sounds. No organomegaly palpated. MSK:  No focal spinal tenderness to palpation. Full range of motion bilaterally in the upper extremities. EXTREMITIES:  No peripheral edema.   SKIN:  Clear with no obvious rashes or skin changes. No nail dyscrasia. NEURO:  Nonfocal. Well oriented.  Appropriate affect.   LAB RESULTS:  CMP     Component  Value Date/Time   NA 140 04/22/2018 0952   K 3.5 04/22/2018 0952   CL 103 04/22/2018 0952   CO2 24 04/22/2018 0952   GLUCOSE 210 (H) 04/22/2018 0952   BUN 8 04/22/2018 0952   CREATININE 0.87 04/22/2018 0952   CALCIUM 8.8 (L) 04/22/2018 0952   PROT 6.2 (L) 04/22/2018 0952   ALBUMIN 3.6 04/22/2018 0952   AST 14 (L) 04/22/2018 0952   ALT 14 04/22/2018 0952   ALKPHOS 125 04/22/2018 0952   BILITOT 0.3 04/22/2018 0952   GFRNONAA >60 04/22/2018 0952   GFRAA >60 04/22/2018 0952    No results found for: TOTALPROTELP, ALBUMINELP, A1GS, A2GS, BETS, BETA2SER, GAMS, MSPIKE, SPEI  No results found for: KPAFRELGTCHN, LAMBDASER, KAPLAMBRATIO  Lab Results  Component Value Date   WBC 19.8 (H) 05/05/2018   NEUTROABS 17.8 (H) 05/05/2018   HGB 11.0 (L) 05/05/2018   HCT 34.0 (L) 05/05/2018   MCV 101.5 (H) 05/05/2018   PLT 278 05/05/2018    '@LASTCHEMISTRY' @  No results found for: LABCA2  No components found for: [id number]  No results for input(s): INR in the last 168 hours.  No results found for: LABCA2  No results found for: QBH419  No results found for: FXT024  No results found for: OXB353  No results found for: CA2729  No components found for: HGQUANT  No results found for: CEA1 / No results found for: CEA1   No results found for: AFPTUMOR  No results found for: CHROMOGRNA  No results found for: PSA1  Appointment on 05/05/2018  Component Date Value Ref Range Status  . WBC Count 05/05/2018 19.8* 3.9 - 10.3 K/uL Final  . RBC 05/05/2018 3.35* 3.70 - 5.45 MIL/uL Final  . Hemoglobin 05/05/2018 11.0* 11.6 - 15.9 g/dL Final  . HCT 05/05/2018 34.0* 34.8 - 46.6 % Final  . MCV 05/05/2018 101.5* 79.5 - 101.0 fL Final  . MCH 05/05/2018 32.8  25.1 - 34.0 pg Final  . MCHC 05/05/2018 32.4  31.5 - 36.0 g/dL Final  . RDW 05/05/2018 18.1* 11.2 - 14.5 % Final  . Platelet Count 05/05/2018 278  145 - 400 K/uL Final  . Neutrophils Relative % 05/05/2018 90  % Final  . Neutro Abs  05/05/2018 17.8* 1.5 - 6.5 K/uL Final  . Lymphocytes  Relative 05/05/2018 5  % Final  . Lymphs Abs 05/05/2018 1.0  0.9 - 3.3 K/uL Final  . Monocytes Relative 05/05/2018 5  % Final  . Monocytes Absolute 05/05/2018 1.0* 0.1 - 0.9 K/uL Final  . Eosinophils Relative 05/05/2018 0  % Final  . Eosinophils Absolute 05/05/2018 0.0  0.0 - 0.5 K/uL Final  . Basophils Relative 05/05/2018 0  % Final  . Basophils Absolute 05/05/2018 0.0  0.0 - 0.1 K/uL Final   Performed at [facility], [address] [place], [place] [address]    (this displays the last labs from the last 3 days)  No results found for: TOTALPROTELP, ALBUMINELP, A1GS, A2GS, BETS, BETA2SER, GAMS, MSPIKE, SPEI (this displays SPEP labs)  No results found for: KPAFRELGTCHN, LAMBDASER, KAPLAMBRATIO (kappa/lambda light chains)  No results found for: HGBA, HGBA2QUANT, HGBFQUANT, HGBSQUAN (Hemoglobinopathy evaluation)   No results found for: LDH  Lab Results  Component Value Date   IRON 104 09/27/2008   IRONPCTSAT 25.1 09/27/2008   (Iron and TIBC)  No results found for: FERRITIN  Urinalysis    Component Value Date/Time   COLORURINE YELLOW 02/15/2018 2014   APPEARANCEUR CLEAR 02/15/2018 2014   LABSPEC 1.011 02/15/2018 2014   PHURINE 7.0 02/15/2018 2014   [place] 02/15/2018 2014   [place] (A) 02/15/2018 2014   [place] NEGATIVE 02/15/2018 2014   [place] NEGATIVE 02/15/2018 2014   [place] NEGATIVE 02/15/2018 2014   NITRITE NEGATIVE 02/15/2018 2014   LEUKOCYTESUR NEGATIVE 02/15/2018 2014     STUDIES: No results found.  ELIGIBLE FOR AVAILABLE RESEARCH PROTOCOL: Exact signs of study  ASSESSMENT: 52 y.o.  [place], [place] woman status post left breast lower outer quadrant biopsy 12/23/2017 for a clinically multifocal T2 N0, stage II invasive ductal carcinoma, grade 3, essentially estrogen receptor and progesterone receptor negative, but HER-2 amplified, with an MIB-1 of  90%.  (a) breast MRI 01/07/2018 shows the 2.6 cm main mass and 4 additional smaller masses spanning 7.1 cm; there was a suspicious left axillary lymph node  (b) left axillary lymph node biopsy 01/14/2018 was benign/discordant (no lymph node tissue)  (1) neoadjuvant chemotherapy will consist of carboplatin and docetaxel every 21 days for 6 cycles starting 01/20/2018  (2) anti-HER-2 immunotherapy will consist of trastuzumab and Pertuzumab, to be continued for 6 months, starting concurrently with chemotherapy (pertuzumab discontinued after cycle 2 due to diarrhea)  (a) baseline echocardiogram 01/08/2018 shows an ejection fraction in the 60-65% range  (3) definitive surgery pending  (4) adjuvant radiation as appropriate  (5) consider antiestrogens given the weak receptor positivity  PLAN:  Lindsey Wright is doing well today.  Her CBC is stable and she will proceed with her sixth and final cycle of Docetaxel, Carboplatin, Trastuzumab today.  I reviewed the neuropathy issue with Dr. Lindi Adie, and he is ok with her to proceed with treatment as it is ordered (so long as CMET within parameters).  She has her MRI breast scheduled for 9/9.  I spoke with her navigator Dawn who will see about getting it moved up.    Lindsey Wright will return in one week for labs and f/u.  She knows to call for any other issues that may develop before the next visit.  A total of (30) minutes of face-to-face time was spent with this patient with greater than 50% of that time in counseling and care-coordination.   Wilber Bihari, NP  05/05/18 11:02 AM Medical Oncology and Hematology [facility] [place],  [place] [address] Tel. [phone]    Fax. [phone]

## 2018-05-05 NOTE — Patient Instructions (Signed)
[place] Discharge Instructions for Patients Receiving Chemotherapy  Today you received the following chemotherapy agents Docetaxel + Carboplatin + Trastuzumab + Pertuzumab.  To help prevent nausea and vomiting after your treatment, we encourage you to take your nausea medication as directed.   If you develop nausea and vomiting that is not controlled by your nausea medication, call the clinic.   BELOW ARE SYMPTOMS THAT SHOULD BE REPORTED IMMEDIATELY:  *FEVER GREATER THAN 100.5 F  *CHILLS WITH OR WITHOUT FEVER  NAUSEA AND VOMITING THAT IS NOT CONTROLLED WITH YOUR NAUSEA MEDICATION  *UNUSUAL SHORTNESS OF BREATH  *UNUSUAL BRUISING OR BLEEDING  TENDERNESS IN MOUTH AND THROAT WITH OR WITHOUT PRESENCE OF ULCERS  *URINARY PROBLEMS  *BOWEL PROBLEMS  UNUSUAL RASH Items with * indicate a potential emergency and should be followed up as soon as possible.  Feel free to call the clinic should you have any questions or concerns. The clinic phone number is [phone].  Please show the [facility] at check-in to the Emergency Department and triage nurse.

## 2018-05-07 ENCOUNTER — Ambulatory Visit: Payer: 59

## 2018-05-08 NOTE — Progress Notes (Signed)
FMLA successfully faxed to [employer] at [phone]. Mailed copy to patient address on file. 

## 2018-05-11 ENCOUNTER — Ambulatory Visit
Admission: RE | Admit: 2018-05-11 | Discharge: 2018-05-11 | Disposition: A | Discharge status: Home / Self Care | Payer: 59 | Source: Ambulatory Visit | Attending: Oncology | Admitting: Oncology

## 2018-05-11 DIAGNOSIS — Z853 Personal history of malignant neoplasm of breast: Secondary | ICD-10-CM | POA: Diagnosis not present

## 2018-05-11 DIAGNOSIS — Z170 Estrogen receptor positive status [ER+]: Principal | ICD-10-CM

## 2018-05-11 DIAGNOSIS — C50512 Malignant neoplasm of lower-outer quadrant of left female breast: Secondary | ICD-10-CM

## 2018-05-11 MED ORDER — GADOBENATE DIMEGLUMINE 529 MG/ML IV SOLN
17.0000 mL | Freq: Once | INTRAVENOUS | Status: AC | PRN
  Administered 2018-05-11: 17 mL via INTRAVENOUS

## 2018-05-11 MED ORDER — GADOBENATE DIMEGLUMINE 529 MG/ML IV SOLN: 17.0000 mL | Freq: Once | INTRAVENOUS | Status: DC | PRN

## 2018-05-14 ENCOUNTER — Inpatient Hospital Stay (HOSPITAL_BASED_OUTPATIENT_CLINIC_OR_DEPARTMENT_OTHER): Payer: 59 | Admitting: Adult Health

## 2018-05-14 ENCOUNTER — Inpatient Hospital Stay: Payer: 59

## 2018-05-14 ENCOUNTER — Inpatient Hospital Stay: Payer: 59 | Attending: Oncology

## 2018-05-14 VITALS — BP 141/76 | HR 90 | Temp 98.0°F | Resp 18 | Ht 66.5 in | Wt 192.0 lb

## 2018-05-14 DIAGNOSIS — Z5112 Encounter for antineoplastic immunotherapy: Secondary | ICD-10-CM

## 2018-05-14 DIAGNOSIS — Z801 Family history of malignant neoplasm of trachea, bronchus and lung: Secondary | ICD-10-CM | POA: Insufficient documentation

## 2018-05-14 DIAGNOSIS — Z79899 Other long term (current) drug therapy: Secondary | ICD-10-CM | POA: Diagnosis not present

## 2018-05-14 DIAGNOSIS — I10 Essential (primary) hypertension: Secondary | ICD-10-CM

## 2018-05-14 DIAGNOSIS — Z170 Estrogen receptor positive status [ER+]: Secondary | ICD-10-CM | POA: Diagnosis not present

## 2018-05-14 DIAGNOSIS — R0602 Shortness of breath: Secondary | ICD-10-CM | POA: Insufficient documentation

## 2018-05-14 DIAGNOSIS — E669 Obesity, unspecified: Secondary | ICD-10-CM | POA: Diagnosis not present

## 2018-05-14 DIAGNOSIS — N920 Excessive and frequent menstruation with regular cycle: Secondary | ICD-10-CM | POA: Diagnosis not present

## 2018-05-14 DIAGNOSIS — R601 Generalized edema: Secondary | ICD-10-CM | POA: Insufficient documentation

## 2018-05-14 DIAGNOSIS — C50512 Malignant neoplasm of lower-outer quadrant of left female breast: Secondary | ICD-10-CM | POA: Diagnosis not present

## 2018-05-14 DIAGNOSIS — D509 Iron deficiency anemia, unspecified: Secondary | ICD-10-CM | POA: Diagnosis not present

## 2018-05-14 DIAGNOSIS — E876 Hypokalemia: Secondary | ICD-10-CM | POA: Insufficient documentation

## 2018-05-14 DIAGNOSIS — Z95828 Presence of other vascular implants and grafts: Secondary | ICD-10-CM

## 2018-05-14 LAB — CMP (CANCER CENTER ONLY)
ALT: 14 U/L (ref 0–44)
ANION GAP: 9 (ref 5–15)
AST: 15 U/L (ref 15–41)
Albumin: 3.5 g/dL (ref 3.5–5.0)
Alkaline Phosphatase: 121 U/L (ref 38–126)
BUN: 10 mg/dL (ref 6–20)
CHLORIDE: 105 mmol/L (ref 98–111)
CO2: 27 mmol/L (ref 22–32)
Calcium: 9.2 mg/dL (ref 8.9–10.3)
Creatinine: 0.83 mg/dL (ref 0.44–1.00)
Glucose, Bld: 144 mg/dL — ABNORMAL HIGH (ref 70–99)
POTASSIUM: 3.5 mmol/L (ref 3.5–5.1)
Sodium: 141 mmol/L (ref 135–145)
Total Bilirubin: 0.3 mg/dL (ref 0.3–1.2)
Total Protein: 6.1 g/dL — ABNORMAL LOW (ref 6.5–8.1)

## 2018-05-14 LAB — CBC WITH DIFFERENTIAL (CANCER CENTER ONLY)
BASOS PCT: 0 %
Basophils Absolute: 0 10*3/uL (ref 0.0–0.1)
EOS ABS: 0 10*3/uL (ref 0.0–0.5)
Eosinophils Relative: 0 %
HCT: 34.5 % — ABNORMAL LOW (ref 34.8–46.6)
Hemoglobin: 10.9 g/dL — ABNORMAL LOW (ref 11.6–15.9)
Lymphocytes Relative: 10 %
Lymphs Abs: 2.5 10*3/uL (ref 0.9–3.3)
MCH: 32.3 pg (ref 25.1–34.0)
MCHC: 31.6 g/dL (ref 31.5–36.0)
MCV: 102.4 fL — ABNORMAL HIGH (ref 79.5–101.0)
MONO ABS: 1.6 10*3/uL — AB (ref 0.1–0.9)
MONOS PCT: 6 %
NEUTROS PCT: 84 %
Neutro Abs: 22.5 10*3/uL — ABNORMAL HIGH (ref 1.5–6.5)
Platelet Count: 120 10*3/uL — ABNORMAL LOW (ref 145–400)
RBC: 3.37 MIL/uL — ABNORMAL LOW (ref 3.70–5.45)
RDW: 17.8 % — AB (ref 11.2–14.5)
WBC Count: 26.6 10*3/uL — ABNORMAL HIGH (ref 3.9–10.3)

## 2018-05-14 MED ORDER — HEPARIN SOD (PORK) LOCK FLUSH 100 UNIT/ML IV SOLN
500.0000 [IU] | Freq: Once | INTRAVENOUS | Status: AC
  Administered 2018-05-14: 500 [IU]
  Filled 2018-05-14: qty 5

## 2018-05-14 MED ORDER — HEPARIN SOD (PORK) LOCK FLUSH 100 UNIT/ML IV SOLN
250.0000 [IU] | Freq: Once | INTRAVENOUS | Status: DC
  Filled 2018-05-14: qty 5

## 2018-05-14 MED ORDER — SODIUM CHLORIDE 0.9% FLUSH
10.0000 mL | Freq: Once | INTRAVENOUS | Status: AC
  Administered 2018-05-14: 10 mL
  Filled 2018-05-14: qty 10

## 2018-05-14 NOTE — Progress Notes (Addendum)
[place]  [phone] Fax:([phone]) [phone]     ID: Lindsey Wright DOB: [birth year]  MR#: [id number]  PJK#:[id number]  Patient Care Team: [name], MD as PCP - General Magrinat, [name], MD as Consulting Physician (Oncology) Vania Rea, MD as Consulting Physician (Obstetrics and Gynecology) [name], MD as Consulting Physician (General Surgery) Kyung Rudd, MD as Consulting Physician (Radiation Oncology) OTHER MD:   CHIEF COMPLAINT: HER-2 positive breast cancer  CURRENT TREATMENT: completed Neoadjuvant chemotherapy  INTERVAL HISTORY: Lindsey Wright returns today for a follow-up and treatment of her HER-2 positive breast cancer.    Today is day 8 cycle 6 of 6 planned cycles of carboplatin, docetaxel, trastuzumab, and pertuzumab, given 21 days apart. Lindsey Wright underwent MRI of the breasts yesterday and is here today to review those results. She also wants Dr. Jana Hakim to weigh in on her surgical decision.    Her MRI showed complete resolution in her breast of the previously enhancing lesions.  There was a skin lesion noted on her left lower inner quadrant.  She tells me that was a place she attributed as a mosquito bite, and it was red one day and has been fading since then.     REVIEW OF SYSTEMS: Lindsey Wright is doing well today.  She is fatigued.  She denies any issues such as nausea, vomiting, constipation, or diarrhea.  She is without fever or chills. She isn't have headaches.  A detailed ROS was otherwise non contributory today.    HISTORY OF CURRENT ILLNESS: From the original intake note:  "Lindsey Wright" palpated a mass in the left breast about 2 weeks ago. She followed up with her gynecologist, Dr. Jean Rosenthal who referred her to the [facility] for mammography. She underwent unilateral left diagnostic mammography with tomography and left breast ultrasonography at [facility] on 12/20/2017 showing: breast density category B. Suspicious newly palpable  left breast mass at the 5 o'clock lower outer position with internal blow flow measuring 2.3 x 1.8 x 2.5 cm. There are either 2 adjacent masses or a single complicated mass at the 8:09 lower outer position, 4 cm from the nipple measuring 1.8 x 0.5 by 0.9 cm which may represent fibrocystic changes versus a solid mass. No other suspicious findings. Ultrasound showed normal axillary nodes.  Accordingly on 12/23/2017 she proceeded to biopsy of the left breast areas in question. The pathology from this procedure showed ([id number]): At both the 5 o'clock spanning 1.2 cm and 3:30 position spanning 0.7 cm, invasive ductal carcinoma, grade III. Prognostic indicators significant for: estrogen receptor, 10% positive with weak staining intensity and progesterone receptor, 0% negative. Proliferation marker Ki67 at 90%. HER2 amplified with ratios ER2/CEP17 signals 2.33 and average HER2 copies per cell 4.65  The patient's subsequent history is as detailed below.   PAST MEDICAL HISTORY: Past Medical History:  Diagnosis Date  . Anemia    iron deficient anemia  . Breast cancer ([place])   . History of Bell's palsy     x 2 as teenager and in 20's  . Hypertension   . Menorrhagia   . Overweight(278.02)     PAST SURGICAL HISTORY: Past Surgical History:  Procedure Laterality Date  . BREAST BIOPSY Right 02/01/2015   benign  . CESAREAN SECTION CLASSICAL    . CHOLECYSTECTOMY    . COSMETIC SURGERY    . PORTACATH PLACEMENT Right 01/17/2018   Procedure: INSERTION PORT-A-CATH;  Surgeon: [name], MD;  Location: [facility];  Service: General;  Laterality: Right;  . TUBAL LIGATION      FAMILY HISTORY Family History  Problem Relation Age of Onset  . Hypertension Mother   . Diabetes Mother   . Stroke Mother   . Alcohol abuse Father   . Cancer Father        lung ca  . Hypertension Father   . Hypertension Sister   . Colon cancer Neg Hx   . Colon polyps Neg Hx   . Rectal cancer Neg Hx   .  Stomach cancer Neg Hx    The patient's father was diagnosed with lung cancer at age 82 and died the same year. The patient's mother died at age 44 due to several strokes. The patient had 1 brother who died due to strokes and possibly a MI. The patient has 1 sister. She denies a history of breast or ovarian cancer in the family.    GYNECOLOGIC HISTORY:  No LMP recorded. (Menstrual status: Perimenopausal). Menarche: 52 years old Age at first live birth: 52 years old The patient is GXP2. The patient is not having periods, with her LMP being in 2010. She used oral contraceptive for about 10 years with no complications. She never used HRT.    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 Her husband, Lindsey Wright, works part time in Therapist, art.  The patient's daughter Lindsey Wright age 79, is a Ship broker and starting a job. The patient's daughter Lindsey Wright age 28, is a Ship broker.  Both of the patient's daughters live with her.      ADVANCED DIRECTIVES:    HEALTH MAINTENANCE: Social History   Tobacco Use  . Smoking status: Never Smoker  . Smokeless tobacco: Never Used  Substance Use Topics  . Alcohol use: No    Alcohol/week: 0.0 standard drinks  . Drug use: No     Colonoscopy: 03/27/2017 polyp removal/ Dr. Deeann Saint  PAP: May 2017 normal  Bone density:   Allergies  Allergen Reactions  . Ace Inhibitors Cough    Current Outpatient Medications  Medication Sig Dispense Refill  . cholestyramine (QUESTRAN) 4 g packet Take 1 packet (4 g total) by mouth 2 (two) times daily. (Patient taking differently: Take 4 g by mouth 2 (two) times daily. ) 60 each 1  . dexamethasone (DECADRON) 4 MG tablet Take 2 tablets (8 mg total) by mouth 2 (two) times daily. Start the day before Taxotere. Take once the day after, then 2 times a day x 2d. 30 tablet 1  . diphenoxylate-atropine (LOMOTIL) 2.5-0.025 MG tablet Take 1 tablet by mouth 4 (four) times daily as needed for diarrhea or  loose stools. 30 tablet 0  . lidocaine-prilocaine (EMLA) cream Apply to affected area once 30 g 3  . loperamide (LOPERAMIDE A-D) 2 MG tablet Take 4 mg by mouth as needed for diarrhea or loose stools.    Marland Kitchen LORazepam (ATIVAN) 0.5 MG tablet Take 1 tablet (0.5 mg total) by mouth at bedtime as needed (Nausea or vomiting). 30 tablet 0  . losartan (COZAAR) 50 MG tablet Take 25 mg by mouth daily.    . ondansetron (ZOFRAN) 8 MG tablet Take 1 tablet (8 mg total) by mouth every 8 (eight) hours as needed for nausea or vomiting. 20 tablet 3  . potassium chloride SA (KLOR-CON M20) 20 MEQ tablet Take 2 tablets (40 mEq total) by mouth daily. 60 tablet 11  . prochlorperazine (COMPAZINE) 10 MG tablet Take 1 tablet (10 mg total) by mouth every 6 (six)  hours as needed (Nausea or vomiting). 30 tablet 1   No current facility-administered medications for this visit.     OBJECTIVE: Vitals:   05/14/18 1148  BP: (!) 141/76  Pulse: 90  Resp: 18  Temp: 98 F (36.7 C)  SpO2: 100%     Body mass index is 30.53 kg/m.   Wt Readings from Last 3 Encounters:  05/14/18 192 lb (87.1 kg)  05/05/18 202 lb 3.2 oz (91.7 kg)  04/24/18 192 lb (87.1 kg)  ECOG FS:1 GENERAL: Patient is a well appearing female in no acute distress HEENT:  Sclerae anicteric.  Oropharynx clear and moist. No ulcerations or evidence of oropharyngeal candidiasis. Neck is supple.  NODES:  No cervical, supraclavicular, or axillary lymphadenopathy palpated.  BREAST EXAM:  Left breast without any palpable abnormalities.  In left lower inner quadrant there appears to be a healing skin lesion, right breast without any nodule, masses, or areas of concern. LUNGS:  Clear to auscultation bilaterally.  No wheezes or rhonchi. HEART:  Regular rate and rhythm. No murmur appreciated. ABDOMEN:  Soft, nontender.  Positive, normoactive bowel sounds. No organomegaly palpated. MSK:  No focal spinal tenderness to palpation. Full range of motion bilaterally in the upper  extremities. EXTREMITIES:  No peripheral edema.   SKIN:  Clear with no obvious rashes or skin changes. No nail dyscrasia. NEURO:  Nonfocal. Well oriented.  Appropriate affect.   LAB RESULTS:  CMP     Component Value Date/Time   NA 141 05/14/2018 1036   K 3.5 05/14/2018 1036   CL 105 05/14/2018 1036   CO2 27 05/14/2018 1036   GLUCOSE 144 (H) 05/14/2018 1036   BUN 10 05/14/2018 1036   CREATININE 0.83 05/14/2018 1036   CALCIUM 9.2 05/14/2018 1036   PROT 6.1 (L) 05/14/2018 1036   ALBUMIN 3.5 05/14/2018 1036   AST 15 05/14/2018 1036   ALT 14 05/14/2018 1036   ALKPHOS 121 05/14/2018 1036   BILITOT 0.3 05/14/2018 1036   GFRNONAA >60 05/14/2018 1036   GFRAA >60 05/14/2018 1036    No results found for: TOTALPROTELP, ALBUMINELP, A1GS, A2GS, BETS, BETA2SER, GAMS, MSPIKE, SPEI  No results found for: KPAFRELGTCHN, LAMBDASER, KAPLAMBRATIO  Lab Results  Component Value Date   WBC 26.6 (H) 05/14/2018   NEUTROABS 22.5 (H) 05/14/2018   HGB 10.9 (L) 05/14/2018   HCT 34.5 (L) 05/14/2018   MCV 102.4 (H) 05/14/2018   PLT 120 (L) 05/14/2018    '@LASTCHEMISTRY' @  No results found for: LABCA2  No components found for: LTRVUY233  No results for input(s): INR in the last 168 hours.  No results found for: LABCA2  No results found for: IDH686  No results found for: HUO372  No results found for: BMS111  No results found for: CA2729  No components found for: HGQUANT  No results found for: CEA1 / No results found for: CEA1   No results found for: AFPTUMOR  No results found for: CHROMOGRNA  No results found for: PSA1  Appointment on 05/14/2018  Component Date Value Ref Range Status  . Sodium 05/14/2018 141  135 - 145 mmol/L Final  . Potassium 05/14/2018 3.5  3.5 - 5.1 mmol/L Final  . Chloride 05/14/2018 105  98 - 111 mmol/L Final  . CO2 05/14/2018 27  22 - 32 mmol/L Final  . Glucose, Bld 05/14/2018 144* 70 - 99 mg/dL Final  . BUN 05/14/2018 10  6 - 20 mg/dL Final  .  Creatinine 05/14/2018 0.83  0.44 - 1.00 mg/dL  Final  . Calcium 05/14/2018 9.2  8.9 - 10.3 mg/dL Final  . Total Protein 05/14/2018 6.1* 6.5 - 8.1 g/dL Final  . Albumin 05/14/2018 3.5  3.5 - 5.0 g/dL Final  . AST 05/14/2018 15  15 - 41 U/L Final  . ALT 05/14/2018 14  0 - 44 U/L Final  . Alkaline Phosphatase 05/14/2018 121  38 - 126 U/L Final  . Total Bilirubin 05/14/2018 0.3  0.3 - 1.2 mg/dL Final  . GFR, Est Non Af Am 05/14/2018 >60  >60 mL/min Final  . GFR, Est AFR Am 05/14/2018 >60  >60 mL/min Final   Comment: (NOTE) The eGFR has been calculated using the CKD EPI equation. This calculation has not been validated in all clinical situations. eGFR's persistently <60 mL/min signify possible Chronic Kidney Disease.   Georgiann Hahn gap 05/14/2018 9  5 - 15 Final   Performed at [facility], [place] [address] [place], [place] [address]  . WBC Count 05/14/2018 26.6* 3.9 - 10.3 K/uL Final  . RBC 05/14/2018 3.37* 3.70 - 5.45 MIL/uL Final  . Hemoglobin 05/14/2018 10.9* 11.6 - 15.9 g/dL Final  . HCT 05/14/2018 34.5* 34.8 - 46.6 % Final  . MCV 05/14/2018 102.4* 79.5 - 101.0 fL Final  . MCH 05/14/2018 32.3  25.1 - 34.0 pg Final  . MCHC 05/14/2018 31.6  31.5 - 36.0 g/dL Final  . RDW 05/14/2018 17.8* 11.2 - 14.5 % Final  . Platelet Count 05/14/2018 120* 145 - 400 K/uL Final  . Neutrophils Relative % 05/14/2018 84  % Final  . Neutro Abs 05/14/2018 22.5* 1.5 - 6.5 K/uL Final  . Lymphocytes Relative 05/14/2018 10  % Final  . Lymphs Abs 05/14/2018 2.5  0.9 - 3.3 K/uL Final  . Monocytes Relative 05/14/2018 6  % Final  . Monocytes Absolute 05/14/2018 1.6* 0.1 - 0.9 K/uL Final  . Eosinophils Relative 05/14/2018 0  % Final  . Eosinophils Absolute 05/14/2018 0.0  0.0 - 0.5 K/uL Final  . Basophils Relative 05/14/2018 0  % Final  . Basophils Absolute 05/14/2018 0.0  0.0 - 0.1 K/uL Final   Performed at [facility], [place] [address] [place], [place] [address]      (this displays the last labs from the last 3 days)  No results found for: TOTALPROTELP, ALBUMINELP, A1GS, A2GS, BETS, BETA2SER, GAMS, MSPIKE, SPEI (this displays SPEP labs)  No results found for: KPAFRELGTCHN, LAMBDASER, KAPLAMBRATIO (kappa/lambda light chains)  No results found for: HGBA, HGBA2QUANT, HGBFQUANT, HGBSQUAN (Hemoglobinopathy evaluation)   No results found for: LDH  Lab Results  Component Value Date   IRON 104 09/27/2008   IRONPCTSAT 25.1 09/27/2008   (Iron and TIBC)  No results found for: FERRITIN  Urinalysis    Component Value Date/Time   COLORURINE YELLOW 02/15/2018 2014   APPEARANCEUR CLEAR 02/15/2018 2014   LABSPEC 1.011 02/15/2018 2014   [place] 7.0 02/15/2018 2014   [place] 02/15/2018 2014   [place] (A) 02/15/2018 2014   [place] NEGATIVE 02/15/2018 2014   [place] NEGATIVE 02/15/2018 2014   [place] NEGATIVE 02/15/2018 2014   N[place] NEGATIVE 02/15/2018 2014   LEUKOCYTESUR NEGATIVE 02/15/2018 2014     STUDIES: Mr Breast Bilateral W Alpha Cad  Result Date: 05/13/2018 CLINICAL DATA:  History of LEFT breast cancer. Restaging after neoadjuvant chemotherapy. Additional history of benign biopsy of an enlarged lymph node in the LEFT axilla, with the benign pathology result deemed discordant, with previous report suggesting a  plan to localize the LEFT axillary lymph node prior to definitive surgery. LABS:  Not applicable EXAM: BILATERAL BREAST MRI WITH AND WITHOUT CONTRAST TECHNIQUE: Multiplanar, multisequence MR images of both breasts were obtained prior to and following the intravenous administration of 17 ml of MultiHance. Three-dimensional MR images were rendered by post-processing the original MR data using the DynaCAD thin client. The 3D MR images are interpreted and the findings are included in the complete MRI report below. COMPARISON:  Previous exams including breast MRI dated 01/07/2018. FINDINGS: Breast composition: b.  Scattered fibroglandular tissue. Background parenchymal enhancement: Moderate. Right breast: There are no suspicious enhancing masses, non-mass enhancement or secondary signs of malignancy identified within the RIGHT breast. Left breast: The dominant biopsy-proven mass within the retroareolar LEFT breast is no longer seen, previously measuring 2.6 cm, compatible with complete response to interval neoadjuvant chemotherapy. The additional LEFT breast masses described on the previous study, including the additional biopsy-proven cancer in the LEFT breast at the 3:30 o'clock axis, are also resolved in the interval. There is a new enhancing mass at the skin surface of the lower inner quadrant of the LEFT breast, measuring 8 mm, with mixed enhancement kinetics including some suspicious washout (series 11, image 174). Lymph nodes: There are now no abnormal appearing lymph nodes within the bilateral axillary or internal mammary chain regions. The biopsied lymph node within the LEFT axilla has decreased in size and now appears morphologically normal. Ancillary findings:  None. IMPRESSION: 1. There is a new enhancing lesion at the skin surface of the lower inner quadrant of the LEFT breast, measuring 8 mm, with suspicious enhancement kinetics. This may represent a new sebaceous cyst. Recommend targeted ultrasound to ensure benignity. 2. No residual disease is identified within the LEFT breast at the 2 sites of biopsy proven carcinomas, and at the site of an additional suspicious mass described between the 2 biopsy proven carcinomas on the previous MRI report, indicating complete response to interval neoadjuvant chemotherapy. 3. No abnormal appearing lymph nodes within either axillary or internal mammary chain region. The biopsied lymph node within the LEFT axilla has decreased in size and now appears morphologically normal. 4. No evidence of malignancy within the RIGHT breast. RECOMMENDATION: 1. Targeted LEFT breast  ultrasound to evaluate the new skin lesion identified in the lower inner quadrant of the LEFT breast. This may represent a benign sebaceous cyst. 2. Per current treatment plan for patient's known LEFT breast cancer. BI-RADS CATEGORY  4: Suspicious. However, there are known biopsy-proven cancers within the LEFT breast, located at the 5 o'clock axis retroareolar and at the 3:30 o'clock 4 cm from the nipple, with associated biopsy clips at each site. Electronically Signed   By: Franki Cabot M.D.   On: 05/13/2018 09:08    ELIGIBLE FOR AVAILABLE RESEARCH PROTOCOL: Exact signs of study  ASSESSMENT: 52 y.o.  [place], [place] woman status post left breast lower outer quadrant biopsy 12/23/2017 for a clinically multifocal T2 N0, stage II invasive ductal carcinoma, grade 3, essentially estrogen receptor and progesterone receptor negative, but HER-2 amplified, with an MIB-1 of 90%.  (a) breast MRI 01/07/2018 shows the 2.6 cm main mass and 4 additional smaller masses spanning 7.1 cm; there was a suspicious left axillary lymph node  (b) left axillary lymph node biopsy 01/14/2018 was benign/discordant (no lymph node tissue)  (1) neoadjuvant chemotherapy will consist of carboplatin and docetaxel every 21 days for 6 cycles starting 01/20/2018  (2) anti-HER-2 immunotherapy will consist of trastuzumab and Pertuzumab, to be  continued for 6 months, starting concurrently with chemotherapy (pertuzumab discontinued after cycle 2 due to diarrhea)  (a) baseline echocardiogram 01/08/2018 shows an ejection fraction in the 60-65% range  (b) echocardiogram on 04/24/2018 shows an ejection fraction in the 65-70% range  (3) definitive surgery pending  (4) adjuvant radiation as appropriate  (5) consider antiestrogens given the weak receptor positivity  PLAN:  Lindsey Wright is doing well today.  She met with Dr. Jana Hakim and we reviewed her MRI results with her.  She will continue on Trastuzumab alone, every three weeks as  maintenance therapy.  She is seeing cardio oncology.  I have reviewed that with her.    She will see Dr. Marlou Starks on 05/20/18.  She will see [place] back in 2 weeks for labs, f/u, and trastuzumab.  She knows to call for any other issues that may develop before the next visit.   Wilber Bihari, NP  05/15/18 10:44 AM Medical Oncology and Hematology [facility] [address] [place], [place] [address] Tel. [phone]    Fax. [phone]    ADDENDUM: Lindsey Wright did well with her chemotherapy and she has had a very encouraging radiologic response.  The little "cyst" noted RI was an insect bite and is already receding.  I do not think she needs any further evaluation of this at this point.  She is now ready for definitive surgery.  She will return to see [place] on 05/26/2018 for trastuzumab alone which she will continue to total 6 months.  I wrote the appropriate orders.  She understands this should not cause her any problems and should not interfere with her surgical plans.  She maintains an excellent ejection fraction.  I personally saw this patient and performed a substantive portion of this encounter with the listed APP documented above.   Chauncey Cruel, MD Medical Oncology and Hematology [facility] [address] [place], [place] [address] Tel. [phone]    Fax. [phone]

## 2018-05-15 ENCOUNTER — Encounter: Payer: Self-pay | Admitting: Adult Health

## 2018-05-19 ENCOUNTER — Other Ambulatory Visit: Payer: 59

## 2018-05-20 DIAGNOSIS — Z170 Estrogen receptor positive status [ER+]: Secondary | ICD-10-CM | POA: Diagnosis not present

## 2018-05-20 DIAGNOSIS — C50512 Malignant neoplasm of lower-outer quadrant of left female breast: Secondary | ICD-10-CM | POA: Diagnosis not present

## 2018-05-21 MED ORDER — CEFAZOLIN SODIUM-DEXTROSE 2-4 GM/100ML-% IV SOLN
INTRAVENOUS | Status: AC
  Filled 2018-05-21: qty 100

## 2018-05-23 ENCOUNTER — Other Ambulatory Visit: Payer: Self-pay | Admitting: *Deleted

## 2018-05-23 DIAGNOSIS — Z95828 Presence of other vascular implants and grafts: Secondary | ICD-10-CM

## 2018-05-26 ENCOUNTER — Telehealth: Payer: Self-pay | Admitting: Adult Health

## 2018-05-26 ENCOUNTER — Inpatient Hospital Stay: Payer: 59

## 2018-05-26 ENCOUNTER — Ambulatory Visit: Payer: Self-pay | Admitting: General Surgery

## 2018-05-26 ENCOUNTER — Encounter: Payer: Self-pay | Admitting: *Deleted

## 2018-05-26 ENCOUNTER — Inpatient Hospital Stay (HOSPITAL_BASED_OUTPATIENT_CLINIC_OR_DEPARTMENT_OTHER): Payer: 59 | Admitting: Adult Health

## 2018-05-26 VITALS — BP 142/92 | HR 87 | Temp 97.6°F | Resp 18 | Ht 66.5 in | Wt 207.1 lb

## 2018-05-26 DIAGNOSIS — Z170 Estrogen receptor positive status [ER+]: Principal | ICD-10-CM

## 2018-05-26 DIAGNOSIS — C50512 Malignant neoplasm of lower-outer quadrant of left female breast: Secondary | ICD-10-CM

## 2018-05-26 DIAGNOSIS — E669 Obesity, unspecified: Secondary | ICD-10-CM

## 2018-05-26 DIAGNOSIS — D509 Iron deficiency anemia, unspecified: Secondary | ICD-10-CM | POA: Diagnosis not present

## 2018-05-26 DIAGNOSIS — I10 Essential (primary) hypertension: Secondary | ICD-10-CM | POA: Diagnosis not present

## 2018-05-26 DIAGNOSIS — Z79899 Other long term (current) drug therapy: Secondary | ICD-10-CM

## 2018-05-26 DIAGNOSIS — Z5112 Encounter for antineoplastic immunotherapy: Secondary | ICD-10-CM | POA: Diagnosis not present

## 2018-05-26 DIAGNOSIS — Z801 Family history of malignant neoplasm of trachea, bronchus and lung: Secondary | ICD-10-CM

## 2018-05-26 DIAGNOSIS — N920 Excessive and frequent menstruation with regular cycle: Secondary | ICD-10-CM

## 2018-05-26 DIAGNOSIS — Z95828 Presence of other vascular implants and grafts: Secondary | ICD-10-CM

## 2018-05-26 LAB — CBC WITH DIFFERENTIAL (CANCER CENTER ONLY)
Basophils Absolute: 0 10*3/uL (ref 0.0–0.1)
Basophils Relative: 0 %
EOS ABS: 0.1 10*3/uL (ref 0.0–0.5)
Eosinophils Relative: 1 %
HCT: 32.8 % — ABNORMAL LOW (ref 34.8–46.6)
HEMOGLOBIN: 10.9 g/dL — AB (ref 11.6–15.9)
Lymphocytes Relative: 18 %
Lymphs Abs: 1.2 10*3/uL (ref 0.9–3.3)
MCH: 33.1 pg (ref 25.1–34.0)
MCHC: 33.2 g/dL (ref 31.5–36.0)
MCV: 99.7 fL (ref 79.5–101.0)
MONO ABS: 0.8 10*3/uL (ref 0.1–0.9)
MONOS PCT: 12 %
NEUTROS PCT: 69 %
Neutro Abs: 4.3 10*3/uL (ref 1.5–6.5)
Platelet Count: 222 10*3/uL (ref 145–400)
RBC: 3.29 MIL/uL — ABNORMAL LOW (ref 3.70–5.45)
RDW: 17.9 % — ABNORMAL HIGH (ref 11.2–14.5)
WBC Count: 6.4 10*3/uL (ref 3.9–10.3)

## 2018-05-26 LAB — CMP (CANCER CENTER ONLY)
ALBUMIN: 3.1 g/dL — AB (ref 3.5–5.0)
ALT: 15 U/L (ref 0–44)
AST: 19 U/L (ref 15–41)
Alkaline Phosphatase: 72 U/L (ref 38–126)
Anion gap: 6 (ref 5–15)
BILIRUBIN TOTAL: 0.3 mg/dL (ref 0.3–1.2)
BUN: 8 mg/dL (ref 6–20)
CHLORIDE: 106 mmol/L (ref 98–111)
CO2: 29 mmol/L (ref 22–32)
CREATININE: 0.83 mg/dL (ref 0.44–1.00)
Calcium: 8.6 mg/dL — ABNORMAL LOW (ref 8.9–10.3)
GFR, Est AFR Am: >60 mL/min (ref >60)
GLUCOSE: 185 mg/dL — AB (ref 70–99)
POTASSIUM: 3.2 mmol/L — AB (ref 3.5–5.1)
Sodium: 141 mmol/L (ref 135–145)
Total Protein: 5.6 g/dL — ABNORMAL LOW (ref 6.5–8.1)

## 2018-05-26 MED ORDER — SODIUM CHLORIDE 0.9 % IJ SOLN
10.0000 mL | Freq: Once | INTRAMUSCULAR | Status: AC
  Administered 2018-05-26: 10 mL
  Filled 2018-05-26: qty 10

## 2018-05-26 MED ORDER — TRASTUZUMAB CHEMO 150 MG IV SOLR
6.0000 mg/kg | Freq: Once | INTRAVENOUS | Status: AC
  Administered 2018-05-26: 525 mg via INTRAVENOUS
  Filled 2018-05-26: qty 25

## 2018-05-26 MED ORDER — ACETAMINOPHEN 325 MG PO TABS
650.0000 mg | ORAL_TABLET | Freq: Once | ORAL | Status: AC
  Administered 2018-05-26: 650 mg via ORAL

## 2018-05-26 MED ORDER — SODIUM CHLORIDE 0.9% FLUSH
10.0000 mL | INTRAVENOUS | Status: DC | PRN
  Administered 2018-05-26: 10 mL
  Filled 2018-05-26: qty 10

## 2018-05-26 MED ORDER — DIPHENHYDRAMINE HCL 25 MG PO CAPS
25.0000 mg | ORAL_CAPSULE | Freq: Once | ORAL | Status: AC
  Administered 2018-05-26: 25 mg via ORAL

## 2018-05-26 MED ORDER — ACETAMINOPHEN 325 MG PO TABS
ORAL_TABLET | ORAL | Status: AC
  Filled 2018-05-26: qty 2

## 2018-05-26 MED ORDER — HEPARIN SOD (PORK) LOCK FLUSH 100 UNIT/ML IV SOLN
500.0000 [IU] | Freq: Once | INTRAVENOUS | Status: AC | PRN
  Administered 2018-05-26: 500 [IU]
  Filled 2018-05-26: qty 5

## 2018-05-26 MED ORDER — DIPHENHYDRAMINE HCL 25 MG PO CAPS
ORAL_CAPSULE | ORAL | Status: AC
  Filled 2018-05-26: qty 1

## 2018-05-26 MED ORDER — SODIUM CHLORIDE 0.9 % IV SOLN
Freq: Once | INTRAVENOUS | Status: AC
  Administered 2018-05-26: 12:00:00 via INTRAVENOUS
  Filled 2018-05-26: qty 250

## 2018-05-26 NOTE — Telephone Encounter (Signed)
Gave avs and calendar ° °

## 2018-05-26 NOTE — Patient Instructions (Signed)
[facility] Discharge Instructions for Patients Receiving Chemotherapy  Today you received the following chemotherapy agents herceptin.  To help prevent nausea and vomiting after your treatment, we encourage you to take your nausea medication as directed.   If you develop nausea and vomiting that is not controlled by your nausea medication, call the clinic.   BELOW ARE SYMPTOMS THAT SHOULD BE REPORTED IMMEDIATELY:  *FEVER GREATER THAN 100.5 F  *CHILLS WITH OR WITHOUT FEVER  NAUSEA AND VOMITING THAT IS NOT CONTROLLED WITH YOUR NAUSEA MEDICATION  *UNUSUAL SHORTNESS OF BREATH  *UNUSUAL BRUISING OR BLEEDING  TENDERNESS IN MOUTH AND THROAT WITH OR WITHOUT PRESENCE OF ULCERS  *URINARY PROBLEMS  *BOWEL PROBLEMS  UNUSUAL RASH Items with * indicate a potential emergency and should be followed up as soon as possible.  Feel free to call the clinic should you have any questions or concerns. The clinic phone number is [phone].  Please show the CHEMO ALERT CARD at check-in to the Emergency Department and triage nurse.   

## 2018-05-26 NOTE — Progress Notes (Signed)
[place]  [phone] [phone]     ID: Lindsey Wright DOB: 06/12/1966  MR#: [id number]  RFF#:[id number]  Patient Care Team: [name], MD as PCP - General Magrinat, [name], MD as Consulting Physician (Oncology) Vania Rea, MD as Consulting Physician (Obstetrics and Gynecology) [name], MD as Consulting Physician (General Surgery) Kyung Rudd, MD as Consulting Physician (Radiation Oncology) OTHER MD:   CHIEF COMPLAINT: HER-2 positive breast cancer  CURRENT TREATMENT: completed Neoadjuvant chemotherapy  INTERVAL HISTORY: Lindsey Wright returns today for a follow-up and treatment of her HER-2 positive breast cancer.  She is here to receive Trastuzumab.  She is tolerating this well.  She does remain fatigued.     REVIEW OF SYSTEMS: Lindsey Wright is feeling moderately well.  She met with Dr. Marlou Starks, and will have her surgery scheduled the week of 9/23.  She has not yet had it scheduled.  She has met with cardiology and will see Dr. Haroldine Laws again in November.  Other than fatigue, she denies fevers, chills, nausea, vomiting, constipation, diarrhea, headaches, vision changes, pain, shortness of breath, cough, or any other concerns.  A detailed ROS was otherwise non contributory.    HISTORY OF CURRENT ILLNESS: From the original intake note:  "Lindsey Wright" palpated a mass in the left breast about 2 weeks ago. She followed up with her gynecologist, Dr. Jean Rosenthal who referred her to the [facility] for mammography. She underwent unilateral left diagnostic mammography with tomography and left breast ultrasonography at [facility] on 12/20/2017 showing: breast density category B. Suspicious newly palpable left breast mass at the 5 o'clock lower outer position with internal blow flow measuring 2.3 x 1.8 x 2.5 cm. There are either 2 adjacent masses or a single complicated mass at the 3:57 lower outer position, 4 cm from the nipple measuring 1.8 x 0.5 by  0.9 cm which may represent fibrocystic changes versus a solid mass. No other suspicious findings. Ultrasound showed normal axillary nodes.  Accordingly on 12/23/2017 she proceeded to biopsy of the left breast areas in question. The pathology from this procedure showed ([id number]): At both the 5 o'clock spanning 1.2 cm and 3:30 position spanning 0.7 cm, invasive ductal carcinoma, grade III. Prognostic indicators significant for: estrogen receptor, 10% positive with weak staining intensity and progesterone receptor, 0% negative. Proliferation marker Ki67 at 90%. HER2 amplified with ratios ER2/CEP17 signals 2.33 and average HER2 copies per cell 4.65  The patient's subsequent history is as detailed below.   PAST MEDICAL HISTORY: Past Medical History:  Diagnosis Date  . Anemia    iron deficient anemia  . Breast cancer ([place])   . History of Bell's palsy     x 2 as teenager and in 20's  . Hypertension   . Menorrhagia   . Overweight(278.02)     PAST SURGICAL HISTORY: Past Surgical History:  Procedure Laterality Date  . BREAST BIOPSY Right 02/01/2015   benign  . CESAREAN SECTION CLASSICAL    . CHOLECYSTECTOMY    . COSMETIC SURGERY    . PORTACATH PLACEMENT Right 01/17/2018   Procedure: INSERTION PORT-A-CATH;  Surgeon: [name], MD;  Location: [facility];  Service: General;  Laterality: Right;  . TUBAL LIGATION      FAMILY HISTORY Family History  Problem Relation Age of Onset  . Hypertension Mother   . Diabetes Mother   . Stroke Mother   . Alcohol abuse Father   . Cancer Father  lung ca  . Hypertension Father   . Hypertension Sister   . Colon cancer Neg Hx   . Colon polyps Neg Hx   . Rectal cancer Neg Hx   . Stomach cancer Neg Hx    The patient's father was diagnosed with lung cancer at age 52 and died the same year. The patient's mother died at age 13 due to several strokes. The patient had 1 brother who died due to strokes and possibly a MI. The  patient has 1 sister. She denies a history of breast or ovarian cancer in the family.    GYNECOLOGIC HISTORY:  No LMP recorded. (Menstrual status: Perimenopausal). Menarche: 52 years old Age at first live birth: 52 years old The patient is GXP2. The patient is not having periods, with her LMP being in 2010. She used oral contraceptive for about 10 years with no complications. She never used HRT.    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 Her husband, Lindsey Wright, works part time in Therapist, art.  The patient's daughter Lindsey Wright age 40, is a Ship broker and starting a job. The patient's daughter Lindsey Wright age 4, is a Ship broker.  Both of the patient's daughters live with her.      ADVANCED DIRECTIVES:    HEALTH MAINTENANCE: Social History   Tobacco Use  . Smoking status: Never Smoker  . Smokeless tobacco: Never Used  Substance Use Topics  . Alcohol use: No    Alcohol/week: 0.0 standard drinks  . Drug use: No     Colonoscopy: 03/27/2017 polyp removal/ Dr. Deeann Saint  PAP: May 2017 normal  Bone density:   Allergies  Allergen Reactions  . Ace Inhibitors Cough    Current Outpatient Medications  Medication Sig Dispense Refill  . cholestyramine (QUESTRAN) 4 g packet Take 1 packet (4 g total) by mouth 2 (two) times daily. (Patient taking differently: Take 4 g by mouth 2 (two) times daily. ) 60 each 1  . diphenoxylate-atropine (LOMOTIL) 2.5-0.025 MG tablet Take 1 tablet by mouth 4 (four) times daily as needed for diarrhea or loose stools. 30 tablet 0  . loperamide (LOPERAMIDE A-D) 2 MG tablet Take 4 mg by mouth as needed for diarrhea or loose stools.    Marland Kitchen losartan (COZAAR) 50 MG tablet Take 25 mg by mouth daily.    . ondansetron (ZOFRAN) 8 MG tablet Take 1 tablet (8 mg total) by mouth every 8 (eight) hours as needed for nausea or vomiting. 20 tablet 3  . potassium chloride SA (KLOR-CON M20) 20 MEQ tablet Take 2 tablets (40 mEq total) by mouth daily. 60  tablet 11   No current facility-administered medications for this visit.     OBJECTIVE: Vitals:   05/26/18 1039  BP: (!) 142/92  Pulse: 87  Resp: 18  Temp: 97.6 F (36.4 C)  SpO2: 100%     Body mass index is 32.93 kg/m.   Wt Readings from Last 3 Encounters:  05/26/18 207 lb 1.6 oz (93.9 kg)  05/14/18 192 lb (87.1 kg)  05/05/18 202 lb 3.2 oz (91.7 kg)  ECOG FS:1 GENERAL: Patient is a well appearing female in no acute distress HEENT:  Sclerae anicteric.  Oropharynx clear and moist. No ulcerations or evidence of oropharyngeal candidiasis. Neck is supple.  NODES:  No cervical, supraclavicular, or axillary lymphadenopathy palpated.  BREAST EXAM:  Left breast without any nodules, masses, skin or nipple changes, right breast without any nodules, masses, skin or nipple changes.   LUNGS:  Clear to auscultation bilaterally.  No wheezes or rhonchi. HEART:  Regular rate and rhythm. No murmur appreciated. ABDOMEN:  Soft, nontender.  Positive, normoactive bowel sounds. No organomegaly palpated. MSK:  No focal spinal tenderness to palpation. Full range of motion bilaterally in the upper extremities. EXTREMITIES:  No peripheral edema.   SKIN:  Clear with no obvious rashes or skin changes. No nail dyscrasia. NEURO:  Nonfocal. Well oriented.  Appropriate affect.   LAB RESULTS:  CMP     Component Value Date/Time   NA 141 05/14/2018 1036   K 3.5 05/14/2018 1036   CL 105 05/14/2018 1036   CO2 27 05/14/2018 1036   GLUCOSE 144 (H) 05/14/2018 1036   BUN 10 05/14/2018 1036   CREATININE 0.83 05/14/2018 1036   CALCIUM 9.2 05/14/2018 1036   PROT 6.1 (L) 05/14/2018 1036   ALBUMIN 3.5 05/14/2018 1036   AST 15 05/14/2018 1036   ALT 14 05/14/2018 1036   ALKPHOS 121 05/14/2018 1036   BILITOT 0.3 05/14/2018 1036   GFRNONAA >60 05/14/2018 1036   GFRAA >60 05/14/2018 1036    No results found for: TOTALPROTELP, ALBUMINELP, A1GS, A2GS, BETS, BETA2SER, GAMS, MSPIKE, SPEI  No results found for:  KPAFRELGTCHN, LAMBDASER, KAPLAMBRATIO  Lab Results  Component Value Date   WBC 6.4 05/26/2018   NEUTROABS 4.3 05/26/2018   HGB 10.9 (L) 05/26/2018   HCT 32.8 (L) 05/26/2018   MCV 99.7 05/26/2018   PLT 222 05/26/2018    '@LASTCHEMISTRY' @  No results found for: LABCA2  No components found for: FAOZHY865  No results for input(s): INR in the last 168 hours.  No results found for: LABCA2  No results found for: HQI696  No results found for: EXB284  No results found for: XLK440  No results found for: CA2729  No components found for: HGQUANT  No results found for: CEA1 / No results found for: CEA1   No results found for: AFPTUMOR  No results found for: CHROMOGRNA  No results found for: PSA1  Appointment on 05/26/2018  Component Date Value Ref Range Status  . WBC Count 05/26/2018 6.4  3.9 - 10.3 K/uL Final  . RBC 05/26/2018 3.29* 3.70 - 5.45 MIL/uL Final  . Hemoglobin 05/26/2018 10.9* 11.6 - 15.9 g/dL Final  . HCT 05/26/2018 32.8* 34.8 - 46.6 % Final  . MCV 05/26/2018 99.7  79.5 - 101.0 fL Final  . MCH 05/26/2018 33.1  25.1 - 34.0 pg Final  . MCHC 05/26/2018 33.2  31.5 - 36.0 g/dL Final  . RDW 05/26/2018 17.9* 11.2 - 14.5 % Final  . Platelet Count 05/26/2018 222  145 - 400 K/uL Final  . Neutrophils Relative % 05/26/2018 69  % Final  . Neutro Abs 05/26/2018 4.3  1.5 - 6.5 K/uL Final  . Lymphocytes Relative 05/26/2018 18  % Final  . Lymphs Abs 05/26/2018 1.2  0.9 - 3.3 K/uL Final  . Monocytes Relative 05/26/2018 12  % Final  . Monocytes Absolute 05/26/2018 0.8  0.1 - 0.9 K/uL Final  . Eosinophils Relative 05/26/2018 1  % Final  . Eosinophils Absolute 05/26/2018 0.1  0.0 - 0.5 K/uL Final  . Basophils Relative 05/26/2018 0  % Final  . Basophils Absolute 05/26/2018 0.0  0.0 - 0.1 K/uL Final   Performed at [facility], [place] [address], [place], [place] [address]    (this displays the last labs from the last 3 days)  No results found for:  TOTALPROTELP, ALBUMINELP, A1GS, A2GS, BETS, BETA2SER, GAMS,  MSPIKE, SPEI (this displays SPEP labs)  No results found for: KPAFRELGTCHN, LAMBDASER, KAPLAMBRATIO (kappa/lambda light chains)  No results found for: HGBA, HGBA2QUANT, HGBFQUANT, HGBSQUAN (Hemoglobinopathy evaluation)   No results found for: LDH  Lab Results  Component Value Date   IRON 104 09/27/2008   IRONPCTSAT 25.1 09/27/2008   (Iron and TIBC)  No results found for: FERRITIN  Urinalysis    Component Value Date/Time   COLORURINE YELLOW 02/15/2018 2014   APPEARANCEUR CLEAR 02/15/2018 2014   LABSPEC 1.011 02/15/2018 2014   PHURINE 7.0 02/15/2018 2014   GLUCOSEU NEGATIVE 02/15/2018 2014   [place] (A) 02/15/2018 2014   [place] NEGATIVE 02/15/2018 2014   [place] NEGATIVE 02/15/2018 2014   PROTEINUR NEGATIVE 02/15/2018 2014   NITRITE NEGATIVE 02/15/2018 2014   LEUKOCYTESUR NEGATIVE 02/15/2018 2014     STUDIES: Mr Breast Bilateral W Wo Contrast Inc Cad  Result Date: 05/13/2018 CLINICAL DATA:  History of LEFT breast cancer. Restaging after neoadjuvant chemotherapy. Additional history of benign biopsy of an enlarged lymph node in the LEFT axilla, with the benign pathology result deemed discordant, with previous report suggesting a plan to localize the LEFT axillary lymph node prior to definitive surgery. LABS:  Not applicable EXAM: BILATERAL BREAST MRI WITH AND WITHOUT CONTRAST TECHNIQUE: Multiplanar, multisequence MR images of both breasts were obtained prior to and following the intravenous administration of 17 ml of MultiHance. Three-dimensional MR images were rendered by post-processing the original MR data using the DynaCAD thin client. The 3D MR images are interpreted and the findings are included in the complete MRI report below. COMPARISON:  Previous exams including breast MRI dated 01/07/2018. FINDINGS: Breast composition: b. Scattered fibroglandular tissue. Background parenchymal enhancement: Moderate.  Right breast: There are no suspicious enhancing masses, non-mass enhancement or secondary signs of malignancy identified within the RIGHT breast. Left breast: The dominant biopsy-proven mass within the retroareolar LEFT breast is no longer seen, previously measuring 2.6 cm, compatible with complete response to interval neoadjuvant chemotherapy. The additional LEFT breast masses described on the previous study, including the additional biopsy-proven cancer in the LEFT breast at the 3:30 o'clock axis, are also resolved in the interval. There is a new enhancing mass at the skin surface of the lower inner quadrant of the LEFT breast, measuring 8 mm, with mixed enhancement kinetics including some suspicious washout (series 11, image 174). Lymph nodes: There are now no abnormal appearing lymph nodes within the bilateral axillary or internal mammary chain regions. The biopsied lymph node within the LEFT axilla has decreased in size and now appears morphologically normal. Ancillary findings:  None. IMPRESSION: 1. There is a new enhancing lesion at the skin surface of the lower inner quadrant of the LEFT breast, measuring 8 mm, with suspicious enhancement kinetics. This may represent a new sebaceous cyst. Recommend targeted ultrasound to ensure benignity. 2. No residual disease is identified within the LEFT breast at the 2 sites of biopsy proven carcinomas, and at the site of an additional suspicious mass described between the 2 biopsy proven carcinomas on the previous MRI report, indicating complete response to interval neoadjuvant chemotherapy. 3. No abnormal appearing lymph nodes within either axillary or internal mammary chain region. The biopsied lymph node within the LEFT axilla has decreased in size and now appears morphologically normal. 4. No evidence of malignancy within the RIGHT breast. RECOMMENDATION: 1. Targeted LEFT breast ultrasound to evaluate the new skin lesion identified in the lower inner quadrant of the  LEFT breast. This may represent a benign sebaceous  cyst. 2. Per current treatment plan for patient's known LEFT breast cancer. BI-RADS CATEGORY  4: Suspicious. However, there are known biopsy-proven cancers within the LEFT breast, located at the 5 o'clock axis retroareolar and at the 3:30 o'clock 4 cm from the nipple, with associated biopsy clips at each site. Electronically Signed   By: Franki Cabot M.D.   On: 05/13/2018 09:08    ELIGIBLE FOR AVAILABLE RESEARCH PROTOCOL: Exact signs of study  ASSESSMENT: 52 y.o.  [place], [place] woman status post left breast lower outer quadrant biopsy 12/23/2017 for a clinically multifocal T2 N0, stage II invasive ductal carcinoma, grade 3, essentially estrogen receptor and progesterone receptor negative, but HER-2 amplified, with an MIB-1 of 90%.  (a) breast MRI 01/07/2018 shows the 2.6 cm main mass and 4 additional smaller masses spanning 7.1 cm; there was a suspicious left axillary lymph node  (b) left axillary lymph node biopsy 01/14/2018 was benign/discordant (no lymph node tissue)  (1) neoadjuvant chemotherapy will consist of carboplatin and docetaxel every 21 days for 6 cycles starting 01/20/2018  (2) anti-HER-2 immunotherapy will consist of trastuzumab and Pertuzumab, to be continued for 6 months, starting concurrently with chemotherapy (pertuzumab discontinued after cycle 2 due to diarrhea)  (a) baseline echocardiogram 01/08/2018 shows an ejection fraction in the 60-65% range  (b) echocardiogram on 04/24/2018 shows an ejection fraction in the 65-70% range  (3) definitive surgery pending  (4) adjuvant radiation as appropriate  (5) consider antiestrogens given the weak receptor positivity  PLAN:  Lindsey Wright is doing well today.  She is tolerating treatment well and will undergo treatment with Trastuzumab.  She will undergo surgery in the next week or so.  We will see her back in 3 weeks for labs, f/u to review her pathology, and Trastuzumab.  I went  ahead and requested scheduling of her next couple of months of Trastuzumab.  We reviewed that she would continue to f/u with Dr. Haroldine Laws per his recommendations, which is typically every 3 months until her maintenance Trastuzumab is completed.    She knows to call for any other issues that may develop before the next visit.  A total of (20) minutes of face-to-face time was spent with this patient with greater than 50% of that time in counseling and care-coordination.    Wilber Bihari, NP  05/26/18 10:54 AM Medical Oncology and Hematology [facility] [address] [place], [place] [address] Tel. [phone]    Fax. [phone]

## 2018-05-27 ENCOUNTER — Encounter: Payer: Self-pay | Admitting: Adult Health

## 2018-05-29 ENCOUNTER — Other Ambulatory Visit: Payer: Self-pay | Admitting: General Surgery

## 2018-05-29 DIAGNOSIS — Z170 Estrogen receptor positive status [ER+]: Principal | ICD-10-CM

## 2018-05-29 DIAGNOSIS — C50512 Malignant neoplasm of lower-outer quadrant of left female breast: Secondary | ICD-10-CM

## 2018-05-30 ENCOUNTER — Other Ambulatory Visit: Payer: Self-pay | Admitting: General Surgery

## 2018-05-30 DIAGNOSIS — Z170 Estrogen receptor positive status [ER+]: Principal | ICD-10-CM

## 2018-05-30 DIAGNOSIS — C50512 Malignant neoplasm of lower-outer quadrant of left female breast: Secondary | ICD-10-CM

## 2018-06-06 ENCOUNTER — Inpatient Hospital Stay (HOSPITAL_BASED_OUTPATIENT_CLINIC_OR_DEPARTMENT_OTHER): Payer: 59 | Admitting: Medical

## 2018-06-06 ENCOUNTER — Telehealth: Payer: Self-pay | Admitting: Medical

## 2018-06-06 ENCOUNTER — Inpatient Hospital Stay: Payer: 59

## 2018-06-06 ENCOUNTER — Other Ambulatory Visit: Payer: Self-pay

## 2018-06-06 VITALS — BP 147/82 | HR 84 | Temp 97.8°F | Resp 18 | Ht 66.5 in | Wt 205.1 lb

## 2018-06-06 DIAGNOSIS — E669 Obesity, unspecified: Secondary | ICD-10-CM

## 2018-06-06 DIAGNOSIS — Z801 Family history of malignant neoplasm of trachea, bronchus and lung: Secondary | ICD-10-CM

## 2018-06-06 DIAGNOSIS — C50512 Malignant neoplasm of lower-outer quadrant of left female breast: Secondary | ICD-10-CM | POA: Diagnosis not present

## 2018-06-06 DIAGNOSIS — Z5112 Encounter for antineoplastic immunotherapy: Secondary | ICD-10-CM | POA: Diagnosis not present

## 2018-06-06 DIAGNOSIS — I10 Essential (primary) hypertension: Secondary | ICD-10-CM

## 2018-06-06 DIAGNOSIS — R0602 Shortness of breath: Secondary | ICD-10-CM

## 2018-06-06 DIAGNOSIS — R601 Generalized edema: Secondary | ICD-10-CM

## 2018-06-06 DIAGNOSIS — E876 Hypokalemia: Secondary | ICD-10-CM

## 2018-06-06 DIAGNOSIS — D509 Iron deficiency anemia, unspecified: Secondary | ICD-10-CM

## 2018-06-06 DIAGNOSIS — Z79899 Other long term (current) drug therapy: Secondary | ICD-10-CM

## 2018-06-06 DIAGNOSIS — N920 Excessive and frequent menstruation with regular cycle: Secondary | ICD-10-CM

## 2018-06-06 DIAGNOSIS — Z170 Estrogen receptor positive status [ER+]: Principal | ICD-10-CM

## 2018-06-06 LAB — CBC WITH DIFFERENTIAL (CANCER CENTER ONLY)
Basophils Absolute: 0 10*3/uL (ref 0.0–0.1)
Basophils Relative: 1 %
EOS ABS: 0.2 10*3/uL (ref 0.0–0.5)
EOS PCT: 2 %
HCT: 36.9 % (ref 34.8–46.6)
Hemoglobin: 11.8 g/dL (ref 11.6–15.9)
LYMPHS ABS: 1.6 10*3/uL (ref 0.9–3.3)
Lymphocytes Relative: 26 %
MCH: 32.3 pg (ref 25.1–34.0)
MCHC: 32 g/dL (ref 31.5–36.0)
MCV: 101.1 fL — AB (ref 79.5–101.0)
MONOS PCT: 16 %
Monocytes Absolute: 1 10*3/uL — ABNORMAL HIGH (ref 0.1–0.9)
Neutro Abs: 3.6 10*3/uL (ref 1.5–6.5)
Neutrophils Relative %: 55 %
PLATELETS: 205 10*3/uL (ref 145–400)
RBC: 3.65 MIL/uL — ABNORMAL LOW (ref 3.70–5.45)
RDW: 15.9 % — ABNORMAL HIGH (ref 11.2–14.5)
WBC Count: 6.4 10*3/uL (ref 3.9–10.3)

## 2018-06-06 LAB — CMP (CANCER CENTER ONLY)
ALT: 14 U/L (ref 0–44)
ANION GAP: 8 (ref 5–15)
AST: 19 U/L (ref 15–41)
Albumin: 3.7 g/dL (ref 3.5–5.0)
Alkaline Phosphatase: 77 U/L (ref 38–126)
BUN: 9 mg/dL (ref 6–20)
CALCIUM: 9.1 mg/dL (ref 8.9–10.3)
CHLORIDE: 105 mmol/L (ref 98–111)
CO2: 29 mmol/L (ref 22–32)
Creatinine: 0.69 mg/dL (ref 0.44–1.00)
Glucose, Bld: 88 mg/dL (ref 70–99)
Potassium: 3.1 mmol/L — ABNORMAL LOW (ref 3.5–5.1)
SODIUM: 142 mmol/L (ref 135–145)
Total Bilirubin: 0.5 mg/dL (ref 0.3–1.2)
Total Protein: 6.5 g/dL (ref 6.5–8.1)

## 2018-06-06 MED ORDER — HEPARIN SOD (PORK) LOCK FLUSH 100 UNIT/ML IV SOLN
500.0000 [IU] | Freq: Once | INTRAVENOUS | Status: AC
  Administered 2018-06-06: 500 [IU] via INTRAVENOUS
  Filled 2018-06-06: qty 5

## 2018-06-06 MED ORDER — FUROSEMIDE 20 MG PO TABS: 20.0000 mg | ORAL_TABLET | Freq: Every day | ORAL | 0 refills | Status: DC

## 2018-06-06 MED ORDER — SODIUM CHLORIDE 0.9% FLUSH
10.0000 mL | Freq: Once | INTRAVENOUS | Status: AC
  Administered 2018-06-06: 10 mL via INTRAVENOUS
  Filled 2018-06-06: qty 10

## 2018-06-06 NOTE — Progress Notes (Signed)
[facility] Progress Note   Lindsey Wright [id number] 1966-05-06 52 y.o.  Lindsey Wright is managed by Dr. Jana Hakim  Actively treated with chemotherapy/immunotherapy: yes  Current Therapy: Carboplatin, docetaxel, and Herceptin  Last Treated: 05/05/2018 (cycle 6, day 1)  Assessment: Plan:    Malignant neoplasm of lower-outer quadrant of 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, estrogen receptor positive ([place]) - Plan: sodium chloride flush (NS) 0.9 % injection 10 mL, heparin lock flush 100 unit/mL  Anasarca - Plan: furosemide (LASIX) 20 MG tablet, TSH  Hypokalemia - Plan: Basic Metabolic Panel - [facility] Only, CANCELED: CBC with Differential ([place] Only)   ER positive malignant neoplasm of the left breast:  Anasarca: The patient was given a prescription for Lasix 20 mg once daily for 3 days.  She will return to the clinic on 06/09/2018 for follow-up with a TSH to be completed at that time.  Hypokalemia: A chemistry panel returned today with a potassium level of 3.1.  The patient was instructed to increase her potassium to 60 mEq once daily.  She will take an extra 20 mEq today.  She will return to the clinic on 06/09/2018 for follow-up with a repeat chemistry panel to complete completed at that time.  Please see After Visit Summary for patient specific instructions.  Future Appointments  Date Time Provider [place]  06/09/2018  2:00 PM [facility] LAB 6 [facility] None  06/09/2018  3:00 PM Tanner, Lyndon Code., PA-C [facility] None  06/13/2018  1:00 PM [facility] MM IR 1 [facility] GI-BREAST CE  06/13/2018  1:30 PM [facility] MM IR 1 [facility] GI-BREAST CE  06/13/2018  2:00 PM [facility] MM IR 1 [facility]-BCGMM GI-BREAST CE  06/16/2018  7:30 AM [facility] INJ 1 [facility]  06/16/2018  8:30 AM BCG BREAST SPECIMEN [facility] GI-BREAST CE  06/16/2018  8:35 AM BCG BREAST SPECIMEN [facility] GI-BREAST CE  06/16/2018  8:40 AM BCG BREAST SPECIMEN [facility] GI-BREAST CE  07/07/2018 12:30 PM  [facility] LAB 4 [facility] None  07/07/2018 12:45 PM [facility] [place] FLUSH [facility] None  07/07/2018  1:00 PM Cira Rue K, NP [facility] None  07/07/2018  1:45 PM [facility] INFUSION [facility] None  07/29/2018  8:15 AM [facility] LAB 3 [facility] None  07/29/2018  8:30 AM [facility] [place] FLUSH [facility] None  07/29/2018  9:30 AM Causey, Charlestine Massed, NP [facility] None  07/29/2018 10:15 AM [facility] INFUSION [facility] None  07/31/2018  8:00 AM MC ECHO 1-BUZZ [facility]  07/31/2018  9:00 AM Bensimhon, Shaune Pascal, MD [facility] None    Orders Placed This Encounter  Procedures  . TSH  . Basic Metabolic Panel - [facility] Only       Subjective:   Patient ID:  Lindsey Wright is a 52 y.o. (DOB [birth year]) female.  Chief Complaint: No chief complaint on file.   HPI Lindsey Wright is a 52 year old female with a history of a HER-2/neu positive breast cancer who is followed by Dr. Jana Hakim.  She is completed neoadjuvant chemotherapy.  She is receiving trastuzumab.  Her last echocardiogram was completed on 04/24/2018 and returned with an LV EF of between 65 to 70%.  She presents to the office today with a report of ongoing widespread edema including her face lower extremities and neck.  She reports having wide fluctuations in her weight.  Her weight was noted to be 190 pounds on August 13.  On August 26 her weight was noted to be 202.  On September 4 her weight was noted to  be 192.  On September 16 her weight was noted to be 207.  Her weight today is 205.  She has some shortness of breath with activity.  She denies chest discomfort.  She is taking potassium chloride 40 mEq once daily only.  Medications: I have reviewed the patient's current medications.  Allergies:  Allergies  Allergen Reactions  . Ace Inhibitors Cough    Past Medical History:  Diagnosis Date  . Anemia    iron deficient anemia  . Breast cancer ([place])   . History of Bell's palsy     x  2 as teenager and in 20's  . Hypertension   . Menorrhagia   . Overweight(278.02)     Past Surgical History:  Procedure Laterality Date  . BREAST BIOPSY Right 02/01/2015   benign  . CESAREAN SECTION CLASSICAL    . CHOLECYSTECTOMY    . COSMETIC SURGERY    . PORTACATH PLACEMENT Right 01/17/2018   Procedure: INSERTION PORT-A-CATH;  Surgeon: [name], MD;  Location: [facility];  Service: General;  Laterality: Right;  . TUBAL LIGATION      Family History  Problem Relation Age of Onset  . Hypertension Mother   . Diabetes Mother   . Stroke Mother   . Alcohol abuse Father   . Cancer Father        lung ca  . Hypertension Father   . Hypertension Sister   . Colon cancer Neg Hx   . Colon polyps Neg Hx   . Rectal cancer Neg Hx   . Stomach cancer Neg Hx     Social History   Socioeconomic History  . Marital status: Married    Spouse name: Not on file  . Number of children: Not on file  . Years of education: Not on file  . Highest education level: Not on file  Occupational History  . Not on file  Social Needs  . Financial resource strain: Not on file  . Food insecurity:    Worry: Not on file    Inability: Not on file  . Transportation needs:    Medical: Not on file    Non-medical: Not on file  Tobacco Use  . Smoking status: Never Smoker  . Smokeless tobacco: Never Used  Substance and Sexual Activity  . Alcohol use: No    Alcohol/week: 0.0 standard drinks  . Drug use: No  . Sexual activity: Not Currently  Lifestyle  . Physical activity:    Days per week: Not on file    Minutes per session: Not on file  . Stress: Not on file  Relationships  . Social connections:    Talks on phone: Not on file    Gets together: Not on file    Attends religious service: Not on file    Active member of club or organization: Not on file    Attends meetings of clubs or organizations: Not on file    Relationship status: Not on file  . Intimate partner violence:     Fear of current or ex partner: Not on file    Emotionally abused: Not on file    Physically abused: Not on file    Forced sexual activity: Not on file  Other Topics Concern  . Not on file  Social History Narrative  . Not on file    Past Medical History, Surgical history, Social history, and Family history were reviewed and updated as appropriate.   Please see review of systems for  further details on the patient's review from today.   Review of Systems:  Review of Systems  Constitutional: Negative for chills, diaphoresis and fever.  HENT: Negative for trouble swallowing and voice change.        Edema of the face and neck.  Respiratory: Negative for cough, chest tightness, shortness of breath and wheezing.   Cardiovascular: Positive for leg swelling. Negative for chest pain and palpitations.  Gastrointestinal: Negative for abdominal pain, constipation, diarrhea, nausea and vomiting.  Musculoskeletal: Negative for back pain and myalgias.  Neurological: Negative for dizziness, light-headedness and headaches.    Objective:   Physical Exam:  BP (!) 147/82 (BP Location: Left Arm, Patient Position: Sitting) Comment: Notified Nurse of BP  Pulse 84   Temp 97.8 F (36.6 C) (Oral)   Resp 18   Ht 5' 6.5" (1.689 m)   Wt 205 lb 1.6 oz (93 kg)   SpO2 100%   BMI 32.61 kg/m  ECOG: 1  Physical Exam  Constitutional: No distress.  HENT:  Head: Normocephalic and atraumatic.  Edema of the face and neck is noted.  Cardiovascular: Normal rate, regular rhythm and normal heart sounds. Exam reveals no gallop and no friction rub.  No murmur heard. A right chest wall Port-A-Cath is noted. Trace presacral edema is noted.  Pulmonary/Chest: Effort normal and breath sounds normal. No respiratory distress. She has no wheezes. She has no rales.  Musculoskeletal: She exhibits edema (1+ pitting edema to the knees bilaterally as noted.).  Neurological: She is alert.  Skin: Skin is warm and dry. No rash  noted. She is not diaphoretic. No erythema.  Psychiatric: She has a normal mood and affect. Her behavior is normal. Judgment and thought content normal.    Lab Review:     Component Value Date/Time   NA 142 06/06/2018 1512   K 3.1 (L) 06/06/2018 1512   CL 105 06/06/2018 1512   CO2 29 06/06/2018 1512   GLUCOSE 88 06/06/2018 1512   BUN 9 06/06/2018 1512   CREATININE 0.69 06/06/2018 1512   CALCIUM 9.1 06/06/2018 1512   PROT 6.5 06/06/2018 1512   ALBUMIN 3.7 06/06/2018 1512   AST 19 06/06/2018 1512   ALT 14 06/06/2018 1512   ALKPHOS 77 06/06/2018 1512   BILITOT 0.5 06/06/2018 1512   GFRNONAA >60 06/06/2018 1512   GFRAA >60 06/06/2018 1512       Component Value Date/Time   WBC 6.4 06/06/2018 1512   WBC 2.5 (L) 02/16/2018 0435   RBC 3.65 (L) 06/06/2018 1512   HGB 11.8 06/06/2018 1512   HCT 36.9 06/06/2018 1512   PLT 205 06/06/2018 1512   MCV 101.1 (H) 06/06/2018 1512   MCH 32.3 06/06/2018 1512   MCHC 32.0 06/06/2018 1512   RDW 15.9 (H) 06/06/2018 1512   LYMPHSABS 1.6 06/06/2018 1512   MONOABS 1.0 (H) 06/06/2018 1512   EOSABS 0.2 06/06/2018 1512   BASOSABS 0.0 06/06/2018 1512   -------------------------------  Imaging from last 24 hours (if applicable):  Radiology interpretation: Mr Breast Bilateral W [facility] Cad  Result Date: 05/13/2018 CLINICAL DATA:  History of LEFT breast cancer. Restaging after neoadjuvant chemotherapy. Additional history of benign biopsy of an enlarged lymph node in the LEFT axilla, with the benign pathology result deemed discordant, with previous report suggesting a plan to localize the LEFT axillary lymph node prior to definitive surgery. LABS:  Not applicable EXAM: BILATERAL BREAST MRI WITH AND WITHOUT CONTRAST TECHNIQUE: Multiplanar, multisequence MR images of both breasts were  obtained prior to and following the intravenous administration of 17 ml of MultiHance. Three-dimensional MR images were rendered by post-processing the original MR  data using the DynaCAD thin client. The 3D MR images are interpreted and the findings are included in the complete MRI report below. COMPARISON:  Previous exams including breast MRI dated 01/07/2018. FINDINGS: Breast composition: b. Scattered fibroglandular tissue. Background parenchymal enhancement: Moderate. Right breast: There are no suspicious enhancing masses, non-mass enhancement or secondary signs of malignancy identified within the RIGHT breast. Left breast: The dominant biopsy-proven mass within the retroareolar LEFT breast is no longer seen, previously measuring 2.6 cm, compatible with complete response to interval neoadjuvant chemotherapy. The additional LEFT breast masses described on the previous study, including the additional biopsy-proven cancer in the LEFT breast at the 3:30 o'clock axis, are also resolved in the interval. There is a new enhancing mass at the skin surface of the lower inner quadrant of the LEFT breast, measuring 8 mm, with mixed enhancement kinetics including some suspicious washout (series 11, image 174). Lymph nodes: There are now no abnormal appearing lymph nodes within the bilateral axillary or internal mammary chain regions. The biopsied lymph node within the LEFT axilla has decreased in size and now appears morphologically normal. Ancillary findings:  None. IMPRESSION: 1. There is a new enhancing lesion at the skin surface of the lower inner quadrant of the LEFT breast, measuring 8 mm, with suspicious enhancement kinetics. This may represent a new sebaceous cyst. Recommend targeted ultrasound to ensure benignity. 2. No residual disease is identified within the LEFT breast at the 2 sites of biopsy proven carcinomas, and at the site of an additional suspicious mass described between the 2 biopsy proven carcinomas on the previous MRI report, indicating complete response to interval neoadjuvant chemotherapy. 3. No abnormal appearing lymph nodes within either axillary or internal  mammary chain region. The biopsied lymph node within the LEFT axilla has decreased in size and now appears morphologically normal. 4. No evidence of malignancy within the RIGHT breast. RECOMMENDATION: 1. Targeted LEFT breast ultrasound to evaluate the new skin lesion identified in the lower inner quadrant of the LEFT breast. This may represent a benign sebaceous cyst. 2. Per current treatment plan for patient's known LEFT breast cancer. BI-RADS CATEGORY  4: Suspicious. However, there are known biopsy-proven cancers within the LEFT breast, located at the 5 o'clock axis retroareolar and at the 3:30 o'clock 4 cm from the nipple, with associated biopsy clips at each site. Electronically Signed   By: Franki Cabot M.D.   On: 05/13/2018 09:08

## 2018-06-06 NOTE — Telephone Encounter (Signed)
Appts scheduled avs/calendar printed per 9/27 los

## 2018-06-06 NOTE — Patient Instructions (Signed)

## 2018-06-08 ENCOUNTER — Other Ambulatory Visit: Payer: Self-pay | Admitting: Oncology

## 2018-06-09 ENCOUNTER — Inpatient Hospital Stay: Payer: 59

## 2018-06-09 ENCOUNTER — Encounter (HOSPITAL_BASED_OUTPATIENT_CLINIC_OR_DEPARTMENT_OTHER): Payer: Self-pay | Admitting: *Deleted

## 2018-06-09 ENCOUNTER — Inpatient Hospital Stay (HOSPITAL_BASED_OUTPATIENT_CLINIC_OR_DEPARTMENT_OTHER): Payer: 59 | Admitting: Medical

## 2018-06-09 ENCOUNTER — Other Ambulatory Visit: Payer: Self-pay

## 2018-06-09 VITALS — BP 141/85 | HR 74 | Temp 97.6°F | Resp 18 | Ht 66.5 in | Wt 198.6 lb

## 2018-06-09 DIAGNOSIS — Z95828 Presence of other vascular implants and grafts: Secondary | ICD-10-CM

## 2018-06-09 DIAGNOSIS — I10 Essential (primary) hypertension: Secondary | ICD-10-CM

## 2018-06-09 DIAGNOSIS — D509 Iron deficiency anemia, unspecified: Secondary | ICD-10-CM | POA: Diagnosis not present

## 2018-06-09 DIAGNOSIS — E669 Obesity, unspecified: Secondary | ICD-10-CM

## 2018-06-09 DIAGNOSIS — C50512 Malignant neoplasm of lower-outer quadrant of left female breast: Secondary | ICD-10-CM

## 2018-06-09 DIAGNOSIS — Z801 Family history of malignant neoplasm of trachea, bronchus and lung: Secondary | ICD-10-CM

## 2018-06-09 DIAGNOSIS — R0602 Shortness of breath: Secondary | ICD-10-CM

## 2018-06-09 DIAGNOSIS — Z170 Estrogen receptor positive status [ER+]: Secondary | ICD-10-CM

## 2018-06-09 DIAGNOSIS — Z5112 Encounter for antineoplastic immunotherapy: Secondary | ICD-10-CM | POA: Diagnosis not present

## 2018-06-09 DIAGNOSIS — E876 Hypokalemia: Secondary | ICD-10-CM

## 2018-06-09 DIAGNOSIS — Z79899 Other long term (current) drug therapy: Secondary | ICD-10-CM

## 2018-06-09 DIAGNOSIS — R601 Generalized edema: Secondary | ICD-10-CM

## 2018-06-09 DIAGNOSIS — N920 Excessive and frequent menstruation with regular cycle: Secondary | ICD-10-CM

## 2018-06-09 LAB — BASIC METABOLIC PANEL - CANCER CENTER ONLY
Anion gap: 7 (ref 5–15)
BUN: 9 mg/dL (ref 6–20)
CHLORIDE: 104 mmol/L (ref 98–111)
CO2: 30 mmol/L (ref 22–32)
CREATININE: 0.72 mg/dL (ref 0.44–1.00)
Calcium: 9.3 mg/dL (ref 8.9–10.3)
GFR, Est AFR Am: >60 mL/min (ref >60)
Glucose, Bld: 87 mg/dL (ref 70–99)
POTASSIUM: 3.5 mmol/L (ref 3.5–5.1)
SODIUM: 141 mmol/L (ref 135–145)

## 2018-06-09 MED ORDER — HEPARIN SOD (PORK) LOCK FLUSH 100 UNIT/ML IV SOLN
500.0000 [IU] | Freq: Once | INTRAVENOUS | Status: AC
  Administered 2018-06-09: 500 [IU]
  Filled 2018-06-09: qty 5

## 2018-06-09 MED ORDER — SODIUM CHLORIDE 0.9% FLUSH
10.0000 mL | Freq: Once | INTRAVENOUS | Status: AC
  Administered 2018-06-09: 10 mL
  Filled 2018-06-09: qty 10

## 2018-06-09 NOTE — Patient Instructions (Signed)
Implanted Port Home Guide An implanted port is a type of central line that is placed under the skin. Central lines are used to provide IV access when treatment or nutrition needs to be given through a person's veins. Implanted ports are used for long-term IV access. An implanted port may be placed because:  You need IV medicine that would be irritating to the small veins in your hands or arms.  You need long-term IV medicines, such as antibiotics.  You need IV nutrition for a long period.  You need frequent blood draws for lab tests.  You need dialysis.  Implanted ports are usually placed in the chest area, but they can also be placed in the upper arm, the abdomen, or the leg. An implanted port has two main parts:  Reservoir. The reservoir is round and will appear as a small, raised area under your skin. The reservoir is the part where a needle is inserted to give medicines or draw blood.  Catheter. The catheter is a thin, flexible tube that extends from the reservoir. The catheter is placed into a large vein. Medicine that is inserted into the reservoir goes into the catheter and then into the vein.  How will I care for my incision site? Do not get the incision site wet. Bathe or shower as directed by your health care provider. How is my port accessed? Special steps must be taken to access the port:  Before the port is accessed, a numbing cream can be placed on the skin. This helps numb the skin over the port site.  Your health care provider uses a sterile technique to access the port. ? Your health care provider must put on a mask and sterile gloves. ? The skin over your port is cleaned carefully with an antiseptic and allowed to dry. ? The port is gently pinched between sterile gloves, and a needle is inserted into the port.  Only "non-coring" port needles should be used to access the port. Once the port is accessed, a blood return should be checked. This helps ensure that the port  is in the vein and is not clogged.  If your port needs to remain accessed for a constant infusion, a clear (transparent) bandage will be placed over the needle site. The bandage and needle will need to be changed every week, or as directed by your health care provider.  Keep the bandage covering the needle clean and dry. Do not get it wet. Follow your health care provider's instructions on how to take a shower or bath while the port is accessed.  If your port does not need to stay accessed, no bandage is needed over the port.  What is flushing? Flushing helps keep the port from getting clogged. Follow your health care provider's instructions on how and when to flush the port. Ports are usually flushed with saline solution or a medicine called heparin. The need for flushing will depend on how the port is used.  If the port is used for intermittent medicines or blood draws, the port will need to be flushed: ? After medicines have been given. ? After blood has been drawn. ? As part of routine maintenance.  If a constant infusion is running, the port may not need to be flushed.  How long will my port stay implanted? The port can stay in for as long as your health care provider thinks it is needed. When it is time for the port to come out, surgery will be   done to remove it. The procedure is similar to the one performed when the port was put in. When should I seek immediate medical care? When you have an implanted port, you should seek immediate medical care if:  You notice a bad smell coming from the incision site.  You have swelling, redness, or drainage at the incision site.  You have more swelling or pain at the port site or the surrounding area.  You have a fever that is not controlled with medicine.  This information is not intended to replace advice given to you by your health care provider. Make sure you discuss any questions you have with your health care provider. Document  Released: 08/27/2005 Document Revised: 02/02/2016 Document Reviewed: 05/04/2013 Elsevier Interactive Patient Education  2017 [employer]  

## 2018-06-09 NOTE — Progress Notes (Signed)
Pt reports improvement in swelling after taking Lasix.  +1 pitting edema bilat lower legs without tenderness/redness.  Facial swelling present but improved per pt and family member.  Denies CP/SOB or changes in urination.

## 2018-06-10 LAB — TSH: TSH: 0.658 u[IU]/mL (ref 0.308–3.960)

## 2018-06-10 NOTE — Progress Notes (Signed)
[facility] Progress Note   Lindsey Wright [id number] [birth year] 52 y.o.  [name] is managed by Dr. Jana Hakim  Actively treated with chemotherapy/immunotherapy: yes  Current Therapy: Trastuzumab  Last Treated: 05/26/2018 (cycle 1)  Assessment: Plan:    Port-A-Cath in place - Plan: heparin lock flush 100 unit/mL, sodium chloride flush (NS) 0.9 % injection 10 mL  Malignant neoplasm of lower-outer quadrant of 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, estrogen receptor positive ([place]) - Plan: heparin lock flush 100 unit/mL, sodium chloride flush (NS) 0.9 % injection 10 mL  Anasarca  Hypokalemia   ER positive malignant neoplasm of the left breast: The patient is status post cycle 1 of trastuzumab which was dosed on 05/26/2018.  She is awaiting a lumpectomy.  She is scheduled to be seen in follow-up by Dr. Jana Hakim on 07/07/2018.  Anasarca: The patient's facial, neck, and lower extremity edema has improved.  Her weight is decreased by 7 pounds since last week.  She has been instructed to follow her weight.  Should her weight increase by 3 pounds in 1 days then she would take Lasix 20 mg x 1.  History of hypokalemia: The patient's potassium returned at 3.5 today.  She was instructed to continue at a higher dose of potassium chloride at 60 mEq once daily for the next 3 days then 2 decrease it to her baseline of 40 mEq once daily.  Please see After Visit Summary for patient specific instructions.  Future Appointments  Date Time Provider [place]  06/13/2018  1:00 PM [facility] MM IR 1 [facility]-BCGMM GI-BREAST CE  06/13/2018  1:30 PM [facility] MM IR 1 [facility]-BCGMM GI-BREAST CE  06/13/2018  2:00 PM [facility] MM IR 1 GI-BCGMM GI-BREAST CE  06/16/2018  7:30 AM MC-NM INJ 1 [facility]-NM [facility]  06/16/2018  8:30 AM BCG BREAST SPECIMEN GI-BCGMM GI-BREAST CE  06/16/2018  8:35 AM BCG BREAST SPECIMEN GI-BCGMM GI-BREAST CE  06/16/2018  8:40 AM BCG BREAST SPECIMEN GI-BCGMM GI-BREAST CE  07/07/2018  7:45 AM  [facility] LAB 4 [facility] None  07/07/2018  8:00 AM [facility] [place] FLUSH [facility] None  07/07/2018  8:30 AM Magrinat, [name], MD [facility] None  07/07/2018  9:00 AM [facility] INFUSION [facility] None  07/29/2018  8:15 AM [facility] LAB 3 [facility] None  07/29/2018  8:30 AM [facility] [place] FLUSH [facility] None  07/29/2018  9:30 AM Causey, Charlestine Massed, NP [facility] None  07/29/2018 10:15 AM [facility] INFUSION [facility] None  07/31/2018  8:00 AM MC ECHO 1-BUZZ [facility]  07/31/2018  9:00 AM Bensimhon, Shaune Pascal, MD [facility] None    No orders of the defined types were placed in this encounter.      Subjective:   Patient ID:  Lindsey Wright is a 52 y.o. (DOB 01/11/1966) female.  Chief Complaint:  Chief Complaint  Patient presents with  . Edema    HPI Lindsey Wright is a 52 year old female with a history of a HER-2/neu positive breast cancer who is managed by Dr. Jana Hakim.  She has been treated with neoadjuvant chemotherapy and is pending a lumpectomy.  She has received her first treatment of trastuzumab.  She was last seen in the office on 06/06/2018 for an unexpected weight increase and for facial, neck, and lower extremity swelling.  She was placed on Lasix 20 mg once daily for 3 days at the time of her last visit.  She was also instructed to increase her potassium chloride from 40 mEq daily to 60 mEq daily.  She  presents to the office today in follow-up.  She has had a 7 pound weight decrease since her visit last week.  She denies shortness of breath, chest pain, or dizziness.  She states that her peripheral edema is better.  She has some ongoing fatigue.  Review of her recent medical history shows that she has had fairly wide fluctuations in her weight after chemotherapy.  Medications: I have reviewed the patient's current medications.  Allergies:  Allergies  Allergen Reactions  . Ace Inhibitors Cough    Past Medical History:  Diagnosis  Date  . Anemia    iron deficient anemia  . Breast cancer ([place])   . History of Bell's palsy     x 2 as teenager and in 20's  . Hypertension   . Menorrhagia   . Overweight(278.02)   . Peripheral edema     Past Surgical History:  Procedure Laterality Date  . BREAST BIOPSY Right 02/01/2015   benign  . CESAREAN SECTION CLASSICAL    . CHOLECYSTECTOMY    . COSMETIC SURGERY    . PORTACATH PLACEMENT Right 01/17/2018   Procedure: INSERTION PORT-A-CATH;  Surgeon: [name], MD;  Location: [facility];  Service: General;  Laterality: Right;  . TUBAL LIGATION      Family History  Problem Relation Age of Onset  . Hypertension Mother   . Diabetes Mother   . Stroke Mother   . Alcohol abuse Father   . Cancer Father        lung ca  . Hypertension Father   . Hypertension Sister   . Colon cancer Neg Hx   . Colon polyps Neg Hx   . Rectal cancer Neg Hx   . Stomach cancer Neg Hx     Social History   Socioeconomic History  . Marital status: Married    Spouse name: Not on file  . Number of children: Not on file  . Years of education: Not on file  . Highest education level: Not on file  Occupational History  . Not on file  Social Needs  . Financial resource strain: Not on file  . Food insecurity:    Worry: Not on file    Inability: Not on file  . Transportation needs:    Medical: Not on file    Non-medical: Not on file  Tobacco Use  . Smoking status: Never Smoker  . Smokeless tobacco: Never Used  Substance and Sexual Activity  . Alcohol use: No    Alcohol/week: 0.0 standard drinks  . Drug use: No  . Sexual activity: Not Currently    Birth control/protection: Post-menopausal    Comment: BTL  Lifestyle  . Physical activity:    Days per week: Not on file    Minutes per session: Not on file  . Stress: Not on file  Relationships  . Social connections:    Talks on phone: Not on file    Gets together: Not on file    Attends religious service: Not on file      Active member of club or organization: Not on file    Attends meetings of clubs or organizations: Not on file    Relationship status: Not on file  . Intimate partner violence:    Fear of current or ex partner: Not on file    Emotionally abused: Not on file    Physically abused: Not on file    Forced sexual activity: Not on file  Other Topics Concern  .  Not on file  Social History Narrative  . Not on file    Past Medical History, Surgical history, Social history, and Family history were reviewed and updated as appropriate.   Please see review of systems for further details on the patient's review from today.   Review of Systems:  Review of Systems  Constitutional: Positive for fatigue. Negative for chills, diaphoresis and fever.  HENT: Negative for trouble swallowing and voice change.   Respiratory: Negative for cough, chest tightness, shortness of breath and wheezing.   Cardiovascular: Positive for leg swelling. Negative for chest pain and palpitations.  Gastrointestinal: Negative for abdominal pain, constipation, diarrhea, nausea and vomiting.  Musculoskeletal: Negative for back pain and myalgias.  Neurological: Negative for dizziness, light-headedness and headaches.    Objective:   Physical Exam:  BP (!) 141/85 (BP Location: Left Arm, Patient Position: Sitting) Comment: Liza RN is aware  Pulse 74   Temp 97.6 F (36.4 C) (Oral)   Resp 18   Ht 5' 6.5" (1.689 m)   Wt 198 lb 9.6 oz (90.1 kg)   SpO2 99%   BMI 31.57 kg/m  ECOG: 0  Physical Exam  Constitutional: No distress.  HENT:  Head: Normocephalic and atraumatic.  Neck:  Edema of the neck and face has improved.  Cardiovascular: Normal rate, regular rhythm and normal heart sounds. Exam reveals no gallop and no friction rub.  No murmur heard. No presacral edema is noted.  Pulmonary/Chest: Effort normal and breath sounds normal. No stridor. No respiratory distress. She has no wheezes. She has no rales.   Musculoskeletal: She exhibits edema (Trace bilateral lower extremity edema to the knees.).  Neurological: She is alert. Coordination normal.  Skin: Skin is warm and dry. She is not diaphoretic.  Psychiatric: She has a normal mood and affect. Her behavior is normal. Judgment and thought content normal.    Lab Review:     Component Value Date/Time   NA 141 06/09/2018 1405   K 3.5 06/09/2018 1405   CL 104 06/09/2018 1405   CO2 30 06/09/2018 1405   GLUCOSE 87 06/09/2018 1405   BUN 9 06/09/2018 1405   CREATININE 0.72 06/09/2018 1405   CALCIUM 9.3 06/09/2018 1405   PROT 6.5 06/06/2018 1512   ALBUMIN 3.7 06/06/2018 1512   AST 19 06/06/2018 1512   ALT 14 06/06/2018 1512   ALKPHOS 77 06/06/2018 1512   BILITOT 0.5 06/06/2018 1512   GFRNONAA >60 06/09/2018 1405   GFRAA >60 06/09/2018 1405       Component Value Date/Time   WBC 6.4 06/06/2018 1512   WBC 2.5 (L) 02/16/2018 0435   RBC 3.65 (L) 06/06/2018 1512   HGB 11.8 06/06/2018 1512   HCT 36.9 06/06/2018 1512   PLT 205 06/06/2018 1512   MCV 101.1 (H) 06/06/2018 1512   MCH 32.3 06/06/2018 1512   MCHC 32.0 06/06/2018 1512   RDW 15.9 (H) 06/06/2018 1512   LYMPHSABS 1.6 06/06/2018 1512   MONOABS 1.0 (H) 06/06/2018 1512   EOSABS 0.2 06/06/2018 1512   BASOSABS 0.0 06/06/2018 1512   -------------------------------  Imaging from last 24 hours (if applicable):  Radiology interpretation: No results found.

## 2018-06-13 ENCOUNTER — Ambulatory Visit
Admission: RE | Admit: 2018-06-13 | Discharge: 2018-06-13 | Disposition: A | Discharge status: Home / Self Care | Payer: 59 | Source: Ambulatory Visit | Attending: General Surgery | Admitting: General Surgery

## 2018-06-13 DIAGNOSIS — Z170 Estrogen receptor positive status [ER+]: Principal | ICD-10-CM

## 2018-06-13 DIAGNOSIS — R590 Localized enlarged lymph nodes: Secondary | ICD-10-CM | POA: Diagnosis not present

## 2018-06-13 DIAGNOSIS — C50512 Malignant neoplasm of lower-outer quadrant of left female breast: Secondary | ICD-10-CM

## 2018-06-13 DIAGNOSIS — C50012 Malignant neoplasm of nipple and areola, left female breast: Secondary | ICD-10-CM | POA: Diagnosis not present

## 2018-06-13 NOTE — Progress Notes (Signed)
Ensure pre surgery drink given with instructions to complete by 0430 dos, surgical soap given with instructions, pt verbalized understanding. 

## 2018-06-16 ENCOUNTER — Ambulatory Visit
Admission: RE | Admit: 2018-06-16 | Discharge: 2018-06-16 | Disposition: A | Discharge status: Home / Self Care | Payer: 59 | Source: Ambulatory Visit | Attending: General Surgery | Admitting: General Surgery

## 2018-06-16 ENCOUNTER — Ambulatory Visit (HOSPITAL_BASED_OUTPATIENT_CLINIC_OR_DEPARTMENT_OTHER): Payer: 59 | Admitting: Certified Registered"

## 2018-06-16 ENCOUNTER — Encounter (HOSPITAL_BASED_OUTPATIENT_CLINIC_OR_DEPARTMENT_OTHER): Payer: Self-pay | Admitting: Certified Registered"

## 2018-06-16 ENCOUNTER — Ambulatory Visit: Payer: 59

## 2018-06-16 ENCOUNTER — Ambulatory Visit (HOSPITAL_BASED_OUTPATIENT_CLINIC_OR_DEPARTMENT_OTHER)
Admission: RE | Admit: 2018-06-16 | Discharge: 2018-06-16 | Disposition: A | Discharge status: Home / Self Care | Payer: 59 | Source: Ambulatory Visit | Attending: General Surgery | Admitting: General Surgery

## 2018-06-16 ENCOUNTER — Other Ambulatory Visit: Payer: 59

## 2018-06-16 ENCOUNTER — Encounter (HOSPITAL_BASED_OUTPATIENT_CLINIC_OR_DEPARTMENT_OTHER)
Admission: RE | Disposition: A | Discharge status: Home / Self Care | Payer: Self-pay | Source: Ambulatory Visit | Attending: General Surgery

## 2018-06-16 ENCOUNTER — Ambulatory Visit: Payer: 59 | Admitting: Adult Health

## 2018-06-16 ENCOUNTER — Encounter (HOSPITAL_COMMUNITY)
Admission: RE | Admit: 2018-06-16 | Discharge: 2018-06-16 | Disposition: A | Discharge status: Home / Self Care | Payer: 59 | Source: Ambulatory Visit | Attending: General Surgery | Admitting: General Surgery

## 2018-06-16 DIAGNOSIS — C50512 Malignant neoplasm of lower-outer quadrant of left female breast: Secondary | ICD-10-CM

## 2018-06-16 DIAGNOSIS — C50912 Malignant neoplasm of unspecified site of left female breast: Secondary | ICD-10-CM | POA: Diagnosis not present

## 2018-06-16 DIAGNOSIS — Z79899 Other long term (current) drug therapy: Secondary | ICD-10-CM | POA: Diagnosis not present

## 2018-06-16 DIAGNOSIS — Z170 Estrogen receptor positive status [ER+]: Secondary | ICD-10-CM | POA: Diagnosis not present

## 2018-06-16 DIAGNOSIS — I10 Essential (primary) hypertension: Secondary | ICD-10-CM | POA: Insufficient documentation

## 2018-06-16 DIAGNOSIS — R590 Localized enlarged lymph nodes: Secondary | ICD-10-CM | POA: Diagnosis not present

## 2018-06-16 DIAGNOSIS — Z7952 Long term (current) use of systemic steroids: Secondary | ICD-10-CM | POA: Diagnosis not present

## 2018-06-16 DIAGNOSIS — G8918 Other acute postprocedural pain: Secondary | ICD-10-CM | POA: Diagnosis not present

## 2018-06-16 HISTORY — DX: Localized edema: R60.0

## 2018-06-16 HISTORY — PX: BREAST LUMPECTOMY WITH RADIOACTIVE SEED AND SENTINEL LYMPH NODE BIOPSY: SHX6550

## 2018-06-16 HISTORY — DX: Edema, unspecified: R60.9

## 2018-06-16 SURGERY — BREAST LUMPECTOMY WITH RADIOACTIVE SEED AND SENTINEL LYMPH NODE BIOPSY
Anesthesia: General | Site: Breast | Laterality: Left

## 2018-06-16 MED ORDER — DEXAMETHASONE SODIUM PHOSPHATE 4 MG/ML IJ SOLN
INTRAMUSCULAR | Status: DC | PRN
  Administered 2018-06-16: 10 mg via INTRAVENOUS

## 2018-06-16 MED ORDER — ONDANSETRON HCL 4 MG/2ML IJ SOLN
INTRAMUSCULAR | Status: DC | PRN
  Administered 2018-06-16: 4 mg via INTRAVENOUS

## 2018-06-16 MED ORDER — FENTANYL CITRATE (PF) 100 MCG/2ML IJ SOLN
INTRAMUSCULAR | Status: AC
  Filled 2018-06-16: qty 2

## 2018-06-16 MED ORDER — GABAPENTIN 300 MG PO CAPS
300.0000 mg | ORAL_CAPSULE | ORAL | Status: AC
  Administered 2018-06-16: 300 mg via ORAL

## 2018-06-16 MED ORDER — HYDROCODONE-ACETAMINOPHEN 5-325 MG PO TABS: 1.0000 | ORAL_TABLET | Freq: Four times a day (QID) | ORAL | 0 refills | Status: DC | PRN

## 2018-06-16 MED ORDER — MIDAZOLAM HCL 2 MG/2ML IJ SOLN
INTRAMUSCULAR | Status: AC
  Filled 2018-06-16: qty 2

## 2018-06-16 MED ORDER — CEFAZOLIN SODIUM-DEXTROSE 2-4 GM/100ML-% IV SOLN
2.0000 g | INTRAVENOUS | Status: AC
  Administered 2018-06-16: 2 g via INTRAVENOUS

## 2018-06-16 MED ORDER — SCOPOLAMINE 1 MG/3DAYS TD PT72: 1.0000 | MEDICATED_PATCH | Freq: Once | TRANSDERMAL | Status: DC | PRN

## 2018-06-16 MED ORDER — ONDANSETRON HCL 4 MG/2ML IJ SOLN: 4.0000 mg | Freq: Once | INTRAMUSCULAR | Status: DC | PRN

## 2018-06-16 MED ORDER — OXYCODONE HCL 5 MG/5ML PO SOLN: 5.0000 mg | Freq: Once | ORAL | Status: DC | PRN

## 2018-06-16 MED ORDER — ACETAMINOPHEN 500 MG PO TABS
1000.0000 mg | ORAL_TABLET | ORAL | Status: AC
  Administered 2018-06-16: 1000 mg via ORAL

## 2018-06-16 MED ORDER — LACTATED RINGERS IV SOLN
INTRAVENOUS | Status: DC
  Administered 2018-06-16 (×2): via INTRAVENOUS

## 2018-06-16 MED ORDER — CELECOXIB 200 MG PO CAPS
200.0000 mg | ORAL_CAPSULE | ORAL | Status: AC
  Administered 2018-06-16: 200 mg via ORAL

## 2018-06-16 MED ORDER — HEPARIN SOD (PORK) LOCK FLUSH 100 UNIT/ML IV SOLN
INTRAVENOUS | Status: AC
  Filled 2018-06-16: qty 5

## 2018-06-16 MED ORDER — PROPOFOL 500 MG/50ML IV EMUL
INTRAVENOUS | Status: DC | PRN
  Administered 2018-06-16: 50 ug/kg/min via INTRAVENOUS

## 2018-06-16 MED ORDER — ROPIVACAINE HCL 5 MG/ML IJ SOLN
INTRAMUSCULAR | Status: DC | PRN
  Administered 2018-06-16: 30 mL via PERINEURAL

## 2018-06-16 MED ORDER — FENTANYL CITRATE (PF) 100 MCG/2ML IJ SOLN
50.0000 ug | INTRAMUSCULAR | Status: AC | PRN
  Administered 2018-06-16: 25 ug via INTRAVENOUS
  Administered 2018-06-16: 50 ug via INTRAVENOUS
  Administered 2018-06-16: 25 ug via INTRAVENOUS

## 2018-06-16 MED ORDER — BUPIVACAINE HCL (PF) 0.25 % IJ SOLN
INTRAMUSCULAR | Status: DC | PRN
  Administered 2018-06-16: 17 mL

## 2018-06-16 MED ORDER — EPHEDRINE SULFATE 50 MG/ML IJ SOLN
INTRAMUSCULAR | Status: DC | PRN
  Administered 2018-06-16: 10 mg via INTRAVENOUS

## 2018-06-16 MED ORDER — MIDAZOLAM HCL 2 MG/2ML IJ SOLN
1.0000 mg | INTRAMUSCULAR | Status: DC | PRN
  Administered 2018-06-16: 2 mg via INTRAVENOUS

## 2018-06-16 MED ORDER — FENTANYL CITRATE (PF) 100 MCG/2ML IJ SOLN: 25.0000 ug | INTRAMUSCULAR | Status: DC | PRN

## 2018-06-16 MED ORDER — METHYLENE BLUE 0.5 % INJ SOLN
INTRAVENOUS | Status: AC
  Filled 2018-06-16: qty 10

## 2018-06-16 MED ORDER — LIDOCAINE HCL (CARDIAC) PF 100 MG/5ML IV SOSY
PREFILLED_SYRINGE | INTRAVENOUS | Status: DC | PRN
  Administered 2018-06-16: 30 mg via INTRAVENOUS

## 2018-06-16 MED ORDER — CELECOXIB 200 MG PO CAPS
ORAL_CAPSULE | ORAL | Status: AC
  Filled 2018-06-16: qty 1

## 2018-06-16 MED ORDER — ACETAMINOPHEN 500 MG PO TABS
ORAL_TABLET | ORAL | Status: AC
  Filled 2018-06-16: qty 2

## 2018-06-16 MED ORDER — OXYCODONE HCL 5 MG PO TABS: 5.0000 mg | ORAL_TABLET | Freq: Once | ORAL | Status: DC | PRN

## 2018-06-16 MED ORDER — SODIUM CHLORIDE 0.9 % IJ SOLN
INTRAMUSCULAR | Status: AC
  Filled 2018-06-16: qty 10

## 2018-06-16 MED ORDER — TECHNETIUM TC 99M SULFUR COLLOID FILTERED
1.0000 | Freq: Once | INTRAVENOUS | Status: AC | PRN
  Administered 2018-06-16: 1 via INTRADERMAL

## 2018-06-16 MED ORDER — PROPOFOL 10 MG/ML IV BOLUS
INTRAVENOUS | Status: DC | PRN
  Administered 2018-06-16: 150 mg via INTRAVENOUS

## 2018-06-16 MED ORDER — CHLORHEXIDINE GLUCONATE CLOTH 2 % EX PADS: 6.0000 | MEDICATED_PAD | Freq: Once | CUTANEOUS | Status: DC

## 2018-06-16 MED ORDER — BUPIVACAINE HCL (PF) 0.25 % IJ SOLN
INTRAMUSCULAR | Status: AC
  Filled 2018-06-16: qty 120

## 2018-06-16 MED ORDER — GABAPENTIN 300 MG PO CAPS
ORAL_CAPSULE | ORAL | Status: AC
  Filled 2018-06-16: qty 1

## 2018-06-16 MED ORDER — CEFAZOLIN SODIUM-DEXTROSE 2-4 GM/100ML-% IV SOLN
INTRAVENOUS | Status: AC
  Filled 2018-06-16: qty 100

## 2018-06-16 MED ORDER — HEPARIN (PORCINE) IN NACL 1000-0.9 UT/500ML-% IV SOLN
INTRAVENOUS | Status: AC
  Filled 2018-06-16: qty 500

## 2018-06-16 SURGICAL SUPPLY — 49 items
ADH SKN CLS APL DERMABOND .7 (GAUZE/BANDAGES/DRESSINGS) ×1
APPLIER CLIP 9.375 MED OPEN (MISCELLANEOUS) ×3
APR CLP MED 9.3 20 MLT OPN (MISCELLANEOUS) ×1
BINDER BREAST XXLRG (GAUZE/BANDAGES/DRESSINGS) ×2 IMPLANT
BLADE SURG 15 STRL LF DISP TIS (BLADE) ×1 IMPLANT
BLADE SURG 15 STRL SS (BLADE) ×3
CANISTER SUC SOCK COL 7IN (MISCELLANEOUS) IMPLANT
CANISTER SUCT 1200ML W/VALVE (MISCELLANEOUS) IMPLANT
CHLORAPREP W/TINT 26ML (MISCELLANEOUS) ×3 IMPLANT
CLIP APPLIE 9.375 MED OPEN (MISCELLANEOUS) ×1 IMPLANT
COVER BACK TABLE 60X90IN (DRAPES) ×3 IMPLANT
COVER MAYO STAND STRL (DRAPES) ×3 IMPLANT
COVER PROBE W GEL 5X96 (DRAPES) ×3 IMPLANT
DECANTER SPIKE VIAL GLASS SM (MISCELLANEOUS) IMPLANT
DERMABOND ADVANCED (GAUZE/BANDAGES/DRESSINGS) ×2
DERMABOND ADVANCED .7 DNX12 (GAUZE/BANDAGES/DRESSINGS) ×1 IMPLANT
DEVICE DUBIN W/COMP PLATE 8390 (MISCELLANEOUS) ×3 IMPLANT
DRAPE LAPAROSCOPIC ABDOMINAL (DRAPES) ×3 IMPLANT
DRAPE UTILITY XL STRL (DRAPES) ×3 IMPLANT
ELECT COATED BLADE 2.86 ST (ELECTRODE) ×3 IMPLANT
ELECT REM PT RETURN 9FT ADLT (ELECTROSURGICAL) ×3
ELECTRODE REM PT RTRN 9FT ADLT (ELECTROSURGICAL) ×1 IMPLANT
GLOVE BIO SURGEON STRL SZ 6.5 (GLOVE) ×1 IMPLANT
GLOVE BIO SURGEON STRL SZ7.5 (GLOVE) ×3 IMPLANT
GLOVE BIO SURGEONS STRL SZ 6.5 (GLOVE) ×1
GLOVE EXAM NITRILE MD LF STRL (GLOVE) ×2 IMPLANT
GOWN STRL REUS W/ TWL LRG LVL3 (GOWN DISPOSABLE) ×2 IMPLANT
GOWN STRL REUS W/TWL LRG LVL3 (GOWN DISPOSABLE) ×6
ILLUMINATOR WAVEGUIDE N/F (MISCELLANEOUS) IMPLANT
KIT MARKER MARGIN INK (KITS) ×3 IMPLANT
LIGHT WAVEGUIDE WIDE FLAT (MISCELLANEOUS) IMPLANT
NDL HYPO 25X1 1.5 SAFETY (NEEDLE) ×1 IMPLANT
NDL SAFETY ECLIPSE 18X1.5 (NEEDLE) IMPLANT
NEEDLE HYPO 18GX1.5 SHARP (NEEDLE)
NEEDLE HYPO 25X1 1.5 SAFETY (NEEDLE) ×3 IMPLANT
NS IRRIG 1000ML POUR BTL (IV SOLUTION) ×2 IMPLANT
PACK BASIN DAY SURGERY FS (CUSTOM PROCEDURE TRAY) ×3 IMPLANT
PENCIL BUTTON HOLSTER BLD 10FT (ELECTRODE) ×3 IMPLANT
SLEEVE SCD COMPRESS KNEE MED (MISCELLANEOUS) ×3 IMPLANT
SPONGE LAP 18X18 RF (DISPOSABLE) ×3 IMPLANT
SUT MON AB 4-0 PC3 18 (SUTURE) ×6 IMPLANT
SUT SILK 2 0 SH (SUTURE) IMPLANT
SUT VICRYL 3-0 CR8 SH (SUTURE) ×3 IMPLANT
SYR CONTROL 10ML LL (SYRINGE) ×3 IMPLANT
TOWEL GREEN STERILE FF (TOWEL DISPOSABLE) ×3 IMPLANT
TOWEL OR NON WOVEN STRL DISP B (DISPOSABLE) ×1 IMPLANT
TUBE CONNECTING 20'X1/4 (TUBING)
TUBE CONNECTING 20X1/4 (TUBING) IMPLANT
YANKAUER SUCT BULB TIP NO VENT (SUCTIONS) IMPLANT

## 2018-06-16 NOTE — Anesthesia Preprocedure Evaluation (Addendum)
Anesthesia Evaluation  Patient identified by MRN, date of birth, ID band Patient awake    Reviewed: Allergy & Precautions, NPO status , Patient's Chart, lab work & pertinent test results  History of Anesthesia Complications Negative for: history of anesthetic complications  Airway Mallampati: II  TM Distance: >3 FB Neck ROM: Full    Dental no notable dental hx.    Pulmonary neg pulmonary ROS,    Pulmonary exam normal        Cardiovascular hypertension, Normal cardiovascular exam     Neuro/Psych negative neurological ROS  negative psych ROS   GI/Hepatic negative GI ROS, Neg liver ROS,   Endo/Other  negative endocrine ROS  Renal/GU negative Renal ROS  negative genitourinary   Musculoskeletal negative musculoskeletal ROS (+)   Abdominal   Peds  Hematology negative hematology ROS (+)   Anesthesia Other Findings   Reproductive/Obstetrics                            Anesthesia Physical Anesthesia Plan  ASA: II  Anesthesia Plan: General   Post-op Pain Management: GA combined w/ Regional for post-op pain   Induction: Intravenous  PONV Risk Score and Plan: 4 or greater and TIVA, Ondansetron, Treatment may vary due to age or medical condition, Dexamethasone and Midazolam  Airway Management Planned: LMA  Additional Equipment: None  Intra-op Plan:   Post-operative Plan: Extubation in OR  Informed Consent: I have reviewed the patients History and Physical, chart, labs and discussed the procedure including the risks, benefits and alternatives for the proposed anesthesia with the patient or authorized representative who has indicated his/her understanding and acceptance.     Plan Discussed with:   Anesthesia Plan Comments:        Anesthesia Quick Evaluation

## 2018-06-16 NOTE — Anesthesia Procedure Notes (Signed)
Anesthesia Regional Block: Pectoralis block   Pre-Anesthetic Checklist: ,, timeout performed, Correct Patient, Correct Site, Correct Laterality, Correct Procedure, Correct Position, site marked, Risks and benefits discussed,  Surgical consent,  Pre-op evaluation,  At surgeon's request and post-op pain management  Laterality: Left  Prep: chloraprep       Needles:  Injection technique: Single-shot  Needle Type: Echogenic Stimulator Needle     Needle Length: 9cm  Needle Gauge: 21     Additional Needles:   Procedures:,,,, ultrasound used (permanent image in chart),,,,  Narrative:  Start time: 06/16/2018 7:53 AM End time: 06/16/2018 8:00 AM Injection made incrementally with aspirations every 5 mL.  Performed by: Personally  Anesthesiologist: Lidia Collum, MD  Additional Notes: Monitors applied. Injection made in 5cc increments. No resistance to injection. Good needle visualization. Patient tolerated procedure well.

## 2018-06-16 NOTE — H&P (Signed)
Lindsey Wright  Location: [facility] Patient #: [id number] DOB: 08-06-1966 Married / Language: English / Race: Black or African American Female   History of Present Illness  The patient is a 52 year old female who presents for a follow-up for Breast cancer. The patient is a 52 year old black female who has a known cancer in the left breast that was ER positive and PR negative and HER-2 positive with a Ki-67 of 90%. She had 2 areas biopsied in the central and lower outer left breast that were positive. She also had 1 abnormal looking lymph node that was biopsied. It came back benign but was thought to be discordant. She has been treated with neoadjuvant chemotherapy and has had a complete radiographic response with no areas of enhancement left behind and normal-appearing lymph nodes.   Allergies ACE Inhibitors  Lotensin *ANTIHYPERTENSIVES*  Cough. Ace Inhibitors cause cough Allergies Reconciled   Medication History  Dexamethasone ('4MG'$  Tablet, Oral) Active. Prochlorperazine Maleate ('10MG'$  Tablet, Oral) Active. Losartan Potassium (Oral) Specific strength unknown - Active. Medications Reconciled    Review of Systems  General Not Present- Appetite Loss, Chills, Fatigue, Fever, Night Sweats, Weight Gain and Weight Loss. Note: All other systems negative (unless as noted in HPI & included Review of Systems) Skin Not Present- Change in Wart/Mole, Dryness, Hives, Jaundice, New Lesions, Non-Healing Wounds, Rash and Ulcer. HEENT Not Present- Earache, Hearing Loss, Hoarseness, Nose Bleed, Oral Ulcers, Ringing in the Ears, Seasonal Allergies, Sinus Pain, Sore Throat, Visual Disturbances, Wears glasses/contact lenses and Yellow Eyes. Respiratory Not Present- Bloody sputum, Chronic Cough, Difficulty Breathing, Snoring and Wheezing. Breast Not Present- Breast Mass, Breast Pain, Nipple Discharge and Skin Changes. Cardiovascular Present- Chest Pain. Not Present- Difficulty  Breathing Lying Down, Leg Cramps, Palpitations, Rapid Heart Rate, Shortness of Breath and Swelling of Extremities. Gastrointestinal Not Present- Abdominal Pain, Bloating, Bloody Stool, Change in Bowel Habits, Chronic diarrhea, Constipation, Difficulty Swallowing, Excessive gas, Gets full quickly at meals, Hemorrhoids, Indigestion, Nausea, Rectal Pain and Vomiting. Female Genitourinary Not Present- Frequency, Nocturia, Painful Urination, Pelvic Pain and Urgency. Musculoskeletal Not Present- Back Pain, Joint Pain, Joint Stiffness, Muscle Pain, Muscle Weakness and Swelling of Extremities. Neurological Not Present- Decreased Memory, Fainting, Headaches, Numbness, Seizures, Tingling, Tremor, Trouble walking and Weakness. Psychiatric Not Present- Anxiety, Bipolar, Change in Sleep Pattern, Depression, Fearful and Frequent crying. Endocrine Not Present- Cold Intolerance, Excessive Hunger, Hair Changes, Heat Intolerance, Hot flashes and New Diabetes. Hematology Not Present- Easy Bruising, Excessive bleeding, Gland problems, HIV and Persistent Infections.  Vitals  Weight: 201 lb Height: 66in Body Surface Area: 2 m Body Mass Index: 32.44 kg/m  Pain Level: 0/10 Temp.: 97.14F(Temporal)  Pulse: 96 (Regular)  P.OX: 98% (Room air) BP: 160/88 (Sitting, Left Arm, Standard)       Physical Exam General Mental Status-Alert. General Appearance-Consistent with stated age. Hydration-Well hydrated. Voice-Normal.  Head and Neck Head-normocephalic, atraumatic with no lesions or palpable masses. Trachea-midline. Thyroid Gland Characteristics - normal size and consistency.  Eye Eyeball - Bilateral-Extraocular movements intact. Sclera/Conjunctiva - Bilateral-No scleral icterus.  Chest and Lung Exam Chest and lung exam reveals -quiet, even and easy respiratory effort with no use of accessory muscles and on auscultation, normal breath sounds, no adventitious sounds and  normal vocal resonance. Inspection Chest Wall - Normal. Back - normal.  Breast Note: there is no palpable mass in the lower outer central left breast. there is no palpable mass in the right breast. there is no palpable axillary, supraclavicular, or cervical lymphadenopathy   Cardiovascular  Cardiovascular examination reveals -normal heart sounds, regular rate and rhythm with no murmurs and normal pedal pulses bilaterally.  Abdomen Inspection Inspection of the abdomen reveals - No Hernias. Skin - Scar - no surgical scars. Palpation/Percussion Palpation and Percussion of the abdomen reveal - Soft, Non Tender, No Rebound tenderness, No Rigidity (guarding) and No hepatosplenomegaly. Auscultation Auscultation of the abdomen reveals - Bowel sounds normal.  Neurologic Neurologic evaluation reveals -alert and oriented x 3 with no impairment of recent or remote memory. Mental Status-Normal.  Musculoskeletal Normal Exam - Left-Upper Extremity Strength Normal and Lower Extremity Strength Normal. Normal Exam - Right-Upper Extremity Strength Normal and Lower Extremity Strength Normal.  Lymphatic Head & Neck  General Head & Neck Lymphatics: Bilateral - Description - Normal. Axillary  General Axillary Region: Bilateral - Description - Normal. Tenderness - Non Tender. Femoral & Inguinal  Generalized Femoral & Inguinal Lymphatics: Bilateral - Description - Normal. Tenderness - Non Tender.    Assessment & Plan  MALIGNANT NEOPLASM OF LOWER-OUTER QUADRANT OF LEFT BREAST OF FEMALE, ESTROGEN RECEPTOR POSITIVE (C50.512) Impression: The patient has a known cancer of the left breast with 2 areas that were previously biopsied as well as one abnormal looking lymph node that was benign but felt to be discordant. She has finished neoadjuvant therapy and had a great radiographic response. At this point her options would be 2 lumpectomies with targeted node dissection versus mastectomy with  targeted node dissection. I have discussed with her in detail the different risks and benefits as well as some of the technical aspects of both surgeries and she understands. She will contact us when she has made her final decision about what she would like to do and we will proceed accordingly.

## 2018-06-16 NOTE — Op Note (Signed)
06/16/2018  9:57 AM  PATIENT:  Lindsey Wright Lindsey Wright  52 y.o. female  PRE-OPERATIVE DIAGNOSIS:  LEFT BREAST CANCER  POST-OPERATIVE DIAGNOSIS:  LEFT BREAST CANCER  PROCEDURE:  Procedure(s): LEFT BREAST LUMPECTOMY WITH RADIOACTIVE SEED LOCALIZATION X'S 2 WITH SEED TARGETED LYMPH NODE EXCISION AND DEEP LEFT AXILLARY SENTINEL LYMPH NODE BIOPSY (Left)  SURGEON:  Surgeon(s) and Role:    * [name], MD - Primary  PHYSICIAN ASSISTANT:   ASSISTANTS: none   ANESTHESIA:   local and general  EBL:  minimal   BLOOD ADMINISTERED:none  DRAINS: none   LOCAL MEDICATIONS USED:  MARCAINE     SPECIMEN:  Source of Specimen:  left breast tissue with sentinel nodes and targeted node  DISPOSITION OF SPECIMEN:  PATHOLOGY  COUNTS:  YES  TOURNIQUET:  * No tourniquets in log *  DICTATION: .Dragon Dictation   After informed consent was obtained the patient was brought to the operating room and placed in the supine position on the operating table.  After adequate induction of general anesthesia the patient's left chest, breast, and axillary area were prepped with ChloraPrep, allowed to dry, and draped in usual sterile manner.  An appropriate timeout was performed.  Previously to I-125 seeds were placed in the lower outer left breast to mark an area of invasive breast cancer and one seed was placed in a left axillary lymph node that was thought to be discordant.  Earlier in the day the patient underwent injection of 1 mCi of technetium sulfur colloid in the subareolar position on the left.  Attention was first turned to the left axilla.  The neoprobe was switched back and forth between technetium and I-125 in an area of radioactivity was readily identified in the left axilla.  The area overlying this was infiltrated with quarter percent Marcaine.  A small transversely oriented incision was made overlying the area of radioactivity with a 15 blade knife.  The incision was carried through the skin and  subcutaneous tissue sharply with electrocautery until the deep left axillary space was entered.  Blunt hemostat dissection was carried out towards the radioactive seed under the direction of the neoprobe.  I was able to identify the previously biopsied lymph node as well as an additional lymph node right next to it that had some radioactivity in it.  Both of these nodes were excised sharply with electrocautery and the lymphatics were controlled with clips.  A third palpable node was also identified and it was also excised sharply with the electrocautery and the lymphatics were controlled with clips.  Ex vivo counts on sentinel node #1 were approximately 100.  No other hot or palpable lymph nodes were identified in the left axilla.  The area was examined and found to be hemostatic.  The deep layer of the wound was then closed with interrupted 3-0 Vicryl stitches.  The skin was then closed with a running 4-0 Monocryl subcuticular stitch.  Attention was then turned to the left breast.  The neoprobe was set to I-125 in the area of the 2 seeds was readily identified.  This was in the lower outer left breast.  The radiologist notes that there was a third area which was identified between the 2 seeds that has not been previously biopsied.  Because of this we decided to do a large lumpectomy that would encompass both of the seeds.  A radial type incision was made overlying the area of radioactivity in the lower outer quadrant of the left breast with a  15 blade knife.  The incision was carried through the skin and subcutaneous tissue sharply with the electrocautery.  The breast tissue in the lower outer quadrant was then mobilized by separating the breast tissue from the subcutaneous fatty tissue in this quadrant.  The area involving the 2 seeds was then excised sharply with the electrocautery.  Once the specimen was removed it was oriented with the appropriate paint colors.  A specimen radiograph was obtained that showed  the clip and seeds to be near the center of the specimen.  The specimen was then sent to pathology for further evaluation.  The cavity was then irrigated with copious amounts of saline and infiltrated with quarter percent Marcaine.  The cavity was marked with clips.  The mobilized breast tissue was then brought into the lumpectomy cavity and stitch to itself.  The skin was then closed with a running 4-0 Monocryl subcuticular stitch.  Dermabond dressings were applied.  The patient tolerated the procedure well.  At the end of the case all needle sponge and instrument counts were correct.  The patient was then awakened and taken to recovery in stable condition.  PLAN OF CARE: Discharge to home after PACU  PATIENT DISPOSITION:  PACU - hemodynamically stable.   Delay start of Pharmacological VTE agent (>24hrs) due to surgical blood loss or risk of bleeding: not applicable

## 2018-06-16 NOTE — Transfer of Care (Signed)
Immediate Anesthesia Transfer of Care Note  Patient: Lindsey Wright  Procedure(s) Performed: LEFT BREAST LUMPECTOMY WITH RADIOACTIVE SEED X'S 2 WITH SEED TARGETED LYMPH NODE EXCISION AND SENTINEL LYMPH NODE BIOPSY (Left Breast)  Patient Location: PACU  Anesthesia Type:GA combined with regional for post-op pain  Level of Consciousness: drowsy and patient cooperative  Airway & Oxygen Therapy: Patient Spontanous Breathing and Patient connected to face mask oxygen  Post-op Assessment: Report given to RN and Post -op Vital signs reviewed and stable  Post vital signs: Reviewed and stable  Last Vitals:  Vitals Value Taken Time  BP    Temp    Pulse    Resp    SpO2      Last Pain:  Vitals:   06/16/18 0658  TempSrc: Oral  PainSc: 0-No pain         Complications: No apparent anesthesia complications

## 2018-06-16 NOTE — Anesthesia Postprocedure Evaluation (Signed)
Anesthesia Post Note  Patient: Lindsey Wright  Procedure(s) Performed: LEFT BREAST LUMPECTOMY WITH RADIOACTIVE SEED X'S 2 WITH SEED TARGETED LYMPH NODE EXCISION AND SENTINEL LYMPH NODE BIOPSY (Left Breast)     Patient location during evaluation: PACU Anesthesia Type: General Level of consciousness: awake and alert Pain management: pain level controlled Vital Signs Assessment: post-procedure vital signs reviewed and stable Respiratory status: spontaneous breathing, nonlabored ventilation and respiratory function stable Cardiovascular status: blood pressure returned to baseline and stable Postop Assessment: no apparent nausea or vomiting Anesthetic complications: no    Last Vitals:  Vitals:   06/16/18 1100 06/16/18 1145  BP: 135/87 (!) 141/90  Pulse: 89 92  Resp: 18 16  Temp:  36.6 C  SpO2: 93% 95%    Last Pain:  Vitals:   06/16/18 1145  TempSrc:   PainSc: 0-No pain                 Lidia Collum

## 2018-06-16 NOTE — Interval H&P Note (Signed)
History and Physical Interval Note:  06/16/2018 7:55 AM  Lindsey Wright  has presented today for surgery, with the diagnosis of LEFT BREAST CANCER  The various methods of treatment have been discussed with the patient and family. After consideration of risks, benefits and other options for treatment, the patient has consented to  Procedure(s): LEFT BREAST LUMPECTOMY WITH RADIOACTIVE SEED X'S 2 WITH SEED TARGETED LYMPH NODE EXCISION AND SENTINEL LYMPH NODE BIOPSY (Left) as a surgical intervention .  The patient's history has been reviewed, patient examined, no change in status, stable for surgery.  I have reviewed the patient's chart and labs.  Questions were answered to the patient's satisfaction.     [name]

## 2018-06-16 NOTE — Discharge Instructions (Signed)

## 2018-06-16 NOTE — Progress Notes (Signed)
Assisted Dr. Christella Hartigan with left, ultrasound guided, pectoralis block. Side rails up, monitors on throughout procedure. See vital signs in flow sheet. Tolerated Procedure well.

## 2018-06-16 NOTE — Anesthesia Procedure Notes (Signed)
Procedure Name: LMA Insertion Date/Time: 06/16/2018 8:19 AM Performed by: Signe Colt, CRNA Pre-anesthesia Checklist: Patient identified, Emergency Drugs available, Suction available and Patient being monitored Patient Re-evaluated:Patient Re-evaluated prior to induction Oxygen Delivery Method: Circle system utilized Preoxygenation: Pre-oxygenation with 100% oxygen Induction Type: IV induction Ventilation: Mask ventilation without difficulty LMA: LMA inserted LMA Size: 4.0 Number of attempts: 1 Airway Equipment and Method: Bite block Placement Confirmation: positive ETCO2 Tube secured with: Tape Dental Injury: Teeth and Oropharynx as per pre-operative assessment

## 2018-06-17 ENCOUNTER — Encounter (HOSPITAL_BASED_OUTPATIENT_CLINIC_OR_DEPARTMENT_OTHER): Payer: Self-pay | Admitting: General Surgery

## 2018-06-19 ENCOUNTER — Other Ambulatory Visit: Payer: Self-pay | Admitting: *Deleted

## 2018-06-19 ENCOUNTER — Encounter: Payer: Self-pay | Admitting: Radiation Oncology

## 2018-06-19 DIAGNOSIS — C50512 Malignant neoplasm of lower-outer quadrant of left female breast: Secondary | ICD-10-CM

## 2018-06-19 DIAGNOSIS — Z170 Estrogen receptor positive status [ER+]: Principal | ICD-10-CM

## 2018-06-24 NOTE — Progress Notes (Signed)
Location of Breast Cancer:Malignant neoplasm of lower-outer quadrant of 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, estrogen receptor positive  Histology per Pathology Report:  INAL DIAGNOSIS Diagnosis 01-14-18 Lymph node, needle/core biopsy, left axilla, spiral clip - BENIGN ADIPOSE TISSUE AND SKELETAL MUSCLE. Microscopic Comment There is no lymph nodal tissue in this material. Clinical correlation is recommended. (BNS:gt, 01/15/18) Susanne Greenhouse MD Pathologist, Electronic Signature (Case signed 01/15/2018) SpecimenNAL DIAGNOSIS Diagnosis 12-23-17 1. Breast, left, needle core biopsy, 3:30 o'clock/4cm ribbon clip - INVASIVE DUCTAL CARCINOMA, GRADE III. SEE NOTE. 2. Breast, left, needle core biopsy, 5 o'clock/retroareolar coil clip - INVASIVE DUCTAL CARCINOMA, GRADE III. SEE NOTE. Diagnosis Note 1. -2. Carcinoma measures 0.7 cm and 1.2 cm in greatest dimensions in parts 1 and 2 respectively. Dr. Saralyn Pilar has reviewed the case and concurs with the above interpretation. The [facility] was notified on 12/24/17. The breast prognostic profile is pending and will be reported in an  Receptor Status: ER(90 % +), PR (0% -), Her2-neu (-), Ki-(90 %)  Did patient present with symptoms (if so, please note symptoms) or was this found on screening mammography?: Palpable mass on self examination and on diagnostic imaging was found to have 3 areas on her mammogram in the left breast.  Past/Anticipated interventions by surgeon, if any: FINAL DIAGNOSIS Diagnosis  06-16-18 Dr. Autumn Messing 1. Lymph node, sentinel, biopsy, Left axillary - ONE OF ONE LYMPH NODES NEGATIVE FOR CARCINOMA (0/1). - BIOPSY SITE. 1 of 4 FINAL for RAYANN, JOLLEY [name] [id number]) Diagnosis(continued) 2. Lymph node, sentinel, biopsy, Left axillary - ONE OF ONE LYMPH NODES NEGATIVE FOR CARCINOMA (0/1). 3. Lymph node, sentinel, biopsy, Left axillary - ONE OF ONE LYMPH NODES NEGATIVE FOR CARCINOMA (0/1). 4. Breast, lumpectomy, Left  w/seed x2 - INVASIVE DUCTAL CARCINOMA, GRADE 3, SPANNING 0.4 CM. - HIGH GRADE DUCTAL CARCINOMA IN SITU. - INVASIVE CARCINOMA IS 0.3 CM FROM THE ANTERIOR MARGIN FOCALLY. - BIOPSY SITE (X2). - SEE ONCOLOGY TABLE. Microscopic Comment 4. BREAST, STATUS POST NEOADJUVANT TREATMENT Procedure: Left breast lumpectomy with left axillary sentinel lymph node biopsies. Laterality: Left.  Receptor Status: ER(0% -), PR (0% -), Her2-neu (-), Ki-()   Past/Anticipated interventions by medical oncology, if any: Chemotherapy     Neoadjuvant chemoimmunotherapy  Carboplatin and Docetaxel every 21 days for 6  cycles  Anti-HER-2 immunotherapy will consist of trastuzumab and Pertuzumab, to be continued for 6 months, starting concurrently with chemotherapy  Adjuvant radiation as appropriate  Consider antiestrogens given the weak receptor positivity   Lymphedema issues, if any: No   ROM to left arm good has some numbness.  Will see the Physical therapist on Thursday for a one time visit. Skin to left breast breast with discoloration and surgical glue no signs of infection at the incision line and  at the left axilla discoloration at incision line with surgical glue. Follow up appointment saw Dr. Autumn Messing 07-01-18.  Pain issues, if any: No   SAFETY ISSUES:No  Prior radiation? No  Pacemaker/ICD? No  Possible current pregnancy? No  Is the patient on methotrexate? No   GYNECOLOGIC HISTORY:  No LMP recorded. (Menstrual status: Perimenopausal). Menarche: 52 years old Age at first live birth: 52 years old The patient is GXP2. The patient is not having periods, with her LMP being in 2010. She used oral contraceptive for about 10 years with no complications. She never used HRT.  Wt Readings from Last 3 Encounters:  07/08/18 195 lb 6.4 oz (88.6 kg)  07/07/18 196 lb 4.8 oz (89 kg)  06/16/18 197 lb 12 oz (89.7 kg)  BP 125/89 (BP Location: Right Arm, Patient Position: Sitting)   Pulse 76   Temp  97.6 F (36.4 C) (Oral)   Resp 18   Ht 5' 6.5" (1.689 m)   Wt 195 lb 6.4 oz (88.6 kg)   SpO2 98%   BMI 31.07 kg/m   Current Complaints / other details:      Georgena Spurling, RN 06/24/2018,5:40 PM

## 2018-07-04 NOTE — Progress Notes (Signed)
[place]  [phone]) [phone] Fax:(336) [phone]     ID: Lindsey Wright DOB: 1965-12-09  MR#: [id number]  VUY#:[id number]  Patient Care Team: [name], MD as PCP - General Jakhiya Brower, [name], MD as Consulting Physician (Oncology) Vania Rea, MD as Consulting Physician (Obstetrics and Gynecology) [name], MD as Consulting Physician (General Surgery) Kyung Rudd, MD as Consulting Physician (Radiation Oncology) OTHER MD:   CHIEF COMPLAINT: HER-2 positive breast cancer  CURRENT TREATMENT: completed Neoadjuvant chemotherapy  INTERVAL HISTORY: Lindsey Wright returns today for a follow-up and treatment of her HER-2 positive breast cancer. She is here to receive Trastuzumab. She has been doing well overall.   Most recent echocardiogram on 04/24/2018 showed an ejection fraction of 65-70%  Since her last to the office, she underwent left lumpectomy and SNL bx ([id number]) on 06/16/2018 that showed: Residual invasive ductal carcinoma Grade 3, measuring 0.4 cm, with negative margins.  All 3 sentinel lymph nodes examined were clear.  Margins were negative.   REVIEW OF SYSTEMS: Lindsey Wright reports that for exercise, she is starting to take a little 15-minute walks during her breaks at work. She notes that she did well with her prior surgery with minimal pain following. She reports that she only had pain for 2 days following the surgery that was resolved with Rx pain medications. She denies constipation or nausea. She notes a "funny" sensation to the inside of her upper left arm. She hasn't been seen by Physical Therapy yet and her next follow up appointment with Dr. Marlou Starks is in 6 months. She denies unusual headaches, visual changes, vomiting, or dizziness. There has been no unusual cough, phlegm production, or pleurisy. This been no change in bowel or bladder habits. She denies unexplained fatigue or unexplained weight loss, bleeding, rash, or fever. A detailed review of  systems was otherwise stable.    HISTORY OF CURRENT ILLNESS: From the original intake note:  "Lindsey Wright" palpated a mass in the left breast about 2 weeks ago. She followed up with her gynecologist, Dr. Jean Rosenthal who referred her to the [facility] for mammography. She underwent unilateral left diagnostic mammography with tomography and left breast ultrasonography at [facility] on 12/20/2017 showing: breast density category B. Suspicious newly palpable left breast mass at the 5 o'clock lower outer position with internal blow flow measuring 2.3 x 1.8 x 2.5 cm. There are either 2 adjacent masses or a single complicated mass at the 1:11 lower outer position, 4 cm from the nipple measuring 1.8 x 0.5 by 0.9 cm which may represent fibrocystic changes versus a solid mass. No other suspicious findings. Ultrasound showed normal axillary nodes.  Accordingly on 12/23/2017 she proceeded to biopsy of the left breast areas in question. The pathology from this procedure showed ([id number]): At both the 5 o'clock spanning 1.2 cm and 3:30 position spanning 0.7 cm, invasive ductal carcinoma, grade III. Prognostic indicators significant for: estrogen receptor, 10% positive with weak staining intensity and progesterone receptor, 0% negative. Proliferation marker Ki67 at 90%. HER2 amplified with ratios ER2/CEP17 signals 2.33 and average HER2 copies per cell 4.65  The patient's subsequent history is as detailed below.   PAST MEDICAL HISTORY: Past Medical History:  Diagnosis Date  . Anemia    iron deficient anemia  . Breast cancer ([place])   . History of Bell's palsy     x 2 as teenager and in 20's  . Hypertension   . Menorrhagia   . Overweight(278.02)   . Peripheral edema  PAST SURGICAL HISTORY: Past Surgical History:  Procedure Laterality Date  . BREAST BIOPSY Right 02/01/2015   benign  . BREAST LUMPECTOMY WITH RADIOACTIVE SEED AND SENTINEL LYMPH NODE BIOPSY Left 06/16/2018   Procedure: LEFT BREAST  LUMPECTOMY WITH RADIOACTIVE SEED X'S 2 WITH SEED TARGETED LYMPH NODE EXCISION AND SENTINEL LYMPH NODE BIOPSY;  Surgeon: [name], MD;  Location: [facility];  Service: General;  Laterality: Left;  . CESAREAN SECTION CLASSICAL    . CHOLECYSTECTOMY    . COSMETIC SURGERY    . PORTACATH PLACEMENT Right 01/17/2018   Procedure: INSERTION PORT-A-CATH;  Surgeon: [name], MD;  Location: [facility];  Service: General;  Laterality: Right;  . TUBAL LIGATION      FAMILY HISTORY Family History  Problem Relation Age of Onset  . Hypertension Mother   . Diabetes Mother   . Stroke Mother   . Alcohol abuse Father   . Cancer Father        lung ca  . Hypertension Father   . Hypertension Sister   . Colon cancer Neg Hx   . Colon polyps Neg Hx   . Rectal cancer Neg Hx   . Stomach cancer Neg Hx    The patient's father was diagnosed with lung cancer at age 101 and died the same year. The patient's mother died at age 49 due to several strokes. The patient had 1 brother who died due to strokes and possibly a MI. The patient has 1 sister. She denies a history of breast or ovarian cancer in the family.    GYNECOLOGIC HISTORY:  No LMP recorded. (Menstrual status: Perimenopausal). Menarche: 52 years old Age at first live birth: 52 years old The patient is GXP2. The patient is not having periods, with her LMP being in 2010. She used oral contraceptive for about 10 years with no complications. She never used HRT.    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 Her husband, [name], works part time in Therapist, art.  The patient's daughter [name] age 86, is a Ship broker and starting a job. The patient's daughter [name] age 13, is a Ship broker.  Both of the patient's daughters live with her.      ADVANCED DIRECTIVES:    HEALTH MAINTENANCE: Social History   Tobacco Use  . Smoking status: Never Smoker  . Smokeless tobacco: Never Used    Substance Use Topics  . Alcohol use: No    Alcohol/week: 0.0 standard drinks  . Drug use: No     Colonoscopy: 03/27/2017 polyp removal/ Dr. Deeann Saint  PAP: May 2017 normal  Bone density:   Allergies  Allergen Reactions  . Ace Inhibitors Cough    Current Outpatient Medications  Medication Sig Dispense Refill  . diphenoxylate-atropine (LOMOTIL) 2.5-0.025 MG tablet Take 1 tablet by mouth 4 (four) times daily as needed for diarrhea or loose stools. 30 tablet 0  . furosemide (LASIX) 20 MG tablet Take 1 tablet (20 mg total) by mouth daily. Use once daily for 3 days. Repeat as instructed. 30 tablet 0  . HYDROcodone-acetaminophen (NORCO/VICODIN) 5-325 MG tablet Take 1-2 tablets by mouth every 6 (six) hours as needed for moderate pain or severe pain. 15 tablet 0  . losartan (COZAAR) 50 MG tablet Take 25 mg by mouth daily.    . ondansetron (ZOFRAN) 8 MG tablet Take 1 tablet (8 mg total) by mouth every 8 (eight) hours as needed for nausea or vomiting. 20 tablet 3  .  potassium chloride SA (KLOR-CON M20) 20 MEQ tablet Take 2 tablets (40 mEq total) by mouth daily. 60 tablet 11   No current facility-administered medications for this visit.     OBJECTIVE: Middle-aged African-American woman who appears stated age  63:   07/07/18 0823  BP: (!) 150/82  Pulse: 71  Resp: 18  Temp: 98.5 F (36.9 C)  SpO2: 100%     Body mass index is 31.21 kg/m.   Wt Readings from Last 3 Encounters:  07/07/18 196 lb 4.8 oz (89 kg)  06/16/18 197 lb 12 oz (89.7 kg)  06/09/18 198 lb 9.6 oz (90.1 kg)  ECOG FS:1  Sclerae unicteric, EOMs intact No cervical or supraclavicular adenopathy Lungs no rales or rhonchi Heart regular rate and rhythm Abd soft, wheeze, nontender, positive bowel sounds MSK no focal spinal tenderness, no left upper extremity lymphedema Neuro: nonfocal, well oriented, positive affect Breasts: The right breast is unremarkable.  The left breast is status post recent lumpectomy and  sentinel lymph node biopsy.  The incisions are healing nicely, with no dehiscence, erythema, or swelling.  Both axillae are benign   LAB RESULTS:  CMP     Component Value Date/Time   NA 141 06/09/2018 1405   K 3.5 06/09/2018 1405   CL 104 06/09/2018 1405   CO2 30 06/09/2018 1405   GLUCOSE 87 06/09/2018 1405   BUN 9 06/09/2018 1405   CREATININE 0.72 06/09/2018 1405   CALCIUM 9.3 06/09/2018 1405   PROT 6.5 06/06/2018 1512   ALBUMIN 3.7 06/06/2018 1512   AST 19 06/06/2018 1512   ALT 14 06/06/2018 1512   ALKPHOS 77 06/06/2018 1512   BILITOT 0.5 06/06/2018 1512   GFRNONAA >60 06/09/2018 1405   GFRAA >60 06/09/2018 1405    No results found for: TOTALPROTELP, ALBUMINELP, A1GS, A2GS, BETS, BETA2SER, GAMS, MSPIKE, SPEI  No results found for: KPAFRELGTCHN, LAMBDASER, KAPLAMBRATIO  Lab Results  Component Value Date   WBC 6.4 06/06/2018   NEUTROABS 3.6 06/06/2018   HGB 11.8 06/06/2018   HCT 36.9 06/06/2018   MCV 101.1 (H) 06/06/2018   PLT 205 06/06/2018    '@LASTCHEMISTRY' @  No results found for: LABCA2  No components found for: [id number]  No results for input(s): INR in the last 168 hours.  No results found for: LABCA2  No results found for: WPY099  No results found for: IPJ825  No results found for: KNL976  No results found for: CA2729  No components found for: HGQUANT  No results found for: CEA1 / No results found for: CEA1   No results found for: AFPTUMOR  No results found for: CHROMOGRNA  No results found for: PSA1  No visits with results within 3 Day(s) from this visit.  Latest known visit with results is:  Appointment on 06/09/2018  Component Date Value Ref Range Status  . TSH 06/09/2018 0.658  0.308 - 3.960 uIU/mL Final   Performed at [facility], [place] [address] [place], [place] [address]  . Sodium 06/09/2018 141  135 - 145 mmol/L Final  . Potassium 06/09/2018 3.5  3.5 - 5.1 mmol/L Final  . Chloride 06/09/2018 104  98 -  111 mmol/L Final  . CO2 06/09/2018 30  22 - 32 mmol/L Final  . Glucose, Bld 06/09/2018 87  70 - 99 mg/dL Final  . BUN 06/09/2018 9  6 - 20 mg/dL Final  . Creatinine 06/09/2018 0.72  0.44 - 1.00 mg/dL Final  . Calcium 06/09/2018 9.3  8.9 -  10.3 mg/dL Final  . GFR, Est Non Af Am 06/09/2018 >60  >60 mL/min Final  . GFR, Est AFR Am 06/09/2018 >60  >60 mL/min Final   Comment: (NOTE) The eGFR has been calculated using the CKD EPI equation. This calculation has not been validated in all clinical situations. eGFR's persistently <60 mL/min signify possible Chronic Kidney Disease.   Georgiann Hahn gap 06/09/2018 7  5 - 15 Final   Performed at [facility], [place] [address] [place], [place] [address]    (this displays the last labs from the last 3 days)  No results found for: TOTALPROTELP, ALBUMINELP, A1GS, A2GS, BETS, BETA2SER, GAMS, MSPIKE, SPEI (this displays SPEP labs)  No results found for: KPAFRELGTCHN, LAMBDASER, KAPLAMBRATIO (kappa/lambda light chains)  No results found for: HGBA, HGBA2QUANT, HGBFQUANT, HGBSQUAN (Hemoglobinopathy evaluation)   No results found for: LDH  Lab Results  Component Value Date   IRON 104 09/27/2008   IRONPCTSAT 25.1 09/27/2008   (Iron and TIBC)  No results found for: FERRITIN  Urinalysis    Component Value Date/Time   COLORURINE YELLOW 02/15/2018 2014   APPEARANCEUR CLEAR 02/15/2018 2014   LABSPEC 1.011 02/15/2018 2014   PHURINE 7.0 02/15/2018 2014   [place] 02/15/2018 2014   [place] (A) 02/15/2018 2014   [place] NEGATIVE 02/15/2018 2014   [place] NEGATIVE 02/15/2018 2014   [place] NEGATIVE 02/15/2018 2014   NITR[place] NEGATIVE 02/15/2018 2014   LEUKOCYTESUR NEGATIVE 02/15/2018 2014     STUDIES: Nm Sentinel Node Inj-no Rpt (breast)  Result Date: 06/16/2018 Sulfur colloid was injected by the nuclear medicine technologist for melanoma sentinel node.   Mm Breast Surgical Specimen  Result Date:  06/16/2018 CLINICAL DATA:  52 year old patient had radioactive seed bracketing of the left breast prior to lumpectomy today. EXAM: SPECIMEN RADIOGRAPH OF THE LEFT BREAST COMPARISON:  Previous exam(s). FINDINGS: Status post excision of the left breast. The 2 radioactive seeds, the ribbon shaped biopsy clip, and the coil shaped biopsy clip biopsy marker clip are present, completely intact, and were marked for pathology. IMPRESSION: Specimen radiograph of the left breast. Electronically Signed   By: Curlene Dolphin M.D.   On: 06/16/2018 09:46   Mm Breast Surgical Specimen  Result Date: 06/16/2018 CLINICAL DATA:  52 year old patient is undergoing lumpectomy and left axillary node excision today. This report refers to the left axillary lymph node which contains a biopsy clip and was localized with a radioactive seed. EXAM: SPECIMEN RADIOGRAPH OF THE LEFT AXILLA COMPARISON:  Previous exam(s). FINDINGS: Status post excision of the left axilla. The radioactive seed and HydroMARK biopsy marker clip are present, completely intact, and were marked for pathology. Additionally, in discussion with the surgical team, there is a single surgical clip, which is present within the specimen tissue. IMPRESSION: Specimen radiograph of the left axilla. Electronically Signed   By: Curlene Dolphin M.D.   On: 06/16/2018 09:01   Mm Lt Radioactive Seed Loc Mammo Guide  Result Date: 06/13/2018 CLINICAL DATA:  Recently diagnosed grade 3 invasive ductal carcinoma in the 3:30 o'clock and 5 o'clock positions of the left breast. There was a 4 mm suspicious mass between the 2 areas of malignancy on an MR dated 01/07/2018. This has not been biopsied. In talking with Dr. Marlou Starks today, he will do a bracketed excision to include this area. The patient has had neoadjuvant chemotherapy. She had a sonographically abnormal left axillary lymph node biopsied under ultrasound guidance with discordant benign results. This returned to a normal appearance  following the neoadjuvant chemotherapy. This is also being localized today for excision. EXAM: MAMMOGRAPHIC GUIDED RADIOACTIVE SEED LOCALIZATION OF THE LEFT BREAST X 2 ULTRASOUND-GUIDED RADIOACTIVE SEED LOCALIZATION OF THE LEFT AXILLA COMPARISON:  Previous exam(s). FINDINGS: Patient presents for radioactive seed localization prior to left lumpectomy and targeted left axillary lymph node excision. I met with the patient and we discussed the procedure of seed localization including benefits and alternatives. We discussed the high likelihood of a successful procedure. We discussed the risks of the procedure including infection, bleeding, tissue injury and further surgery. We discussed the low dose of radioactivity involved in the procedure. Informed, written consent was given. The usual time-out protocol was performed immediately prior to the procedure. SITE #1: LEFT AXILLARY LYMPH NODE Using ultrasound guidance, sterile technique, 1% lidocaine and an I-125 radioactive seed, the previously biopsied left axillary lymph node and spiral shaped HydroMARK biopsy marker clip were localized using a caudal approach. The follow-up mammogram images confirm the seed in the expected location and were marked for Dr. Marlou Starks. Follow-up survey of the patient confirms presence of the radioactive seed. Order number of I-125 seed:  [id number]. Total activity:  0.252 mCi reference Date: 06/03/2018 SITE #2: COIL SHAPED BIOPSY MARKER CLIP IN THE 5 O'CLOCK RETROAREOLAR POSITION OF THE LEFT BREAST Using mammographic guidance, sterile technique, 1% lidocaine and an I-125 radioactive seed, the coil shaped biopsy marker clip in the 5 o'clock retroareolar position of the left breast was localized using a lateral approach. The follow-up mammogram images confirm the seed in the expected location and were marked for Dr. Marlou Starks. Follow-up survey of the patient confirms presence of the radioactive seed. Order number of I-125 seed:  [id number]. Total  activity:  0.250 mCi reference Date: 05/28/2018 SITE #3: RIBBON SHAPED BIOPSY MARKER CLIP IN THE 3:30 O'CLOCK POSITION OF THE LEFT BREAST Using mammographic guidance, sterile technique, 1% lidocaine and an I-125 radioactive seed, the ribbon shaped biopsy marker clip in the 3:30 o'clock position of the left breast was localized using a lateral approach. The follow-up mammogram images confirm the seed in the expected location and were marked for Dr. Marlou Starks. Follow-up survey of the patient confirms presence of the radioactive seed. Order number of I-125 seed:  [id number]. Total activity:  0.250 mCi reference Date: 05/28/2018 The patient tolerated the procedures well and was released from the [facility]. She was given instructions regarding seed removal. IMPRESSION: Radioactive seed localization left axilla and left breast x 2. No apparent complications. Electronically Signed   By: Claudie Revering M.D.   On: 06/13/2018 15:26   Mm Lt Rad Seed Ea Add Lesion Loc Mammo  Result Date: 06/13/2018 CLINICAL DATA:  Recently diagnosed grade 3 invasive ductal carcinoma in the 3:30 o'clock and 5 o'clock positions of the left breast. There was a 4 mm suspicious mass between the 2 areas of malignancy on an MR dated 01/07/2018. This has not been biopsied. In talking with Dr. Marlou Starks today, he will do a bracketed excision to include this area. The patient has had neoadjuvant chemotherapy. She had a sonographically abnormal left axillary lymph node biopsied under ultrasound guidance with discordant benign results. This returned to a normal appearance following the neoadjuvant chemotherapy. This is also being localized today for excision. EXAM: MAMMOGRAPHIC GUIDED RADIOACTIVE SEED LOCALIZATION OF THE LEFT BREAST X 2 ULTRASOUND-GUIDED RADIOACTIVE SEED LOCALIZATION OF THE LEFT AXILLA COMPARISON:  Previous exam(s). FINDINGS: Patient presents for radioactive seed localization prior to left lumpectomy and targeted left axillary lymph node  excision. I met  with the patient and we discussed the procedure of seed localization including benefits and alternatives. We discussed the high likelihood of a successful procedure. We discussed the risks of the procedure including infection, bleeding, tissue injury and further surgery. We discussed the low dose of radioactivity involved in the procedure. Informed, written consent was given. The usual time-out protocol was performed immediately prior to the procedure. SITE #1: LEFT AXILLARY LYMPH NODE Using ultrasound guidance, sterile technique, 1% lidocaine and an I-125 radioactive seed, the previously biopsied left axillary lymph node and spiral shaped HydroMARK biopsy marker clip were localized using a caudal approach. The follow-up mammogram images confirm the seed in the expected location and were marked for Dr. Marlou Starks. Follow-up survey of the patient confirms presence of the radioactive seed. Order number of I-125 seed:  [id number]. Total activity:  0.252 mCi reference Date: 06/03/2018 SITE #2: COIL SHAPED BIOPSY MARKER CLIP IN THE 5 O'CLOCK RETROAREOLAR POSITION OF THE LEFT BREAST Using mammographic guidance, sterile technique, 1% lidocaine and an I-125 radioactive seed, the coil shaped biopsy marker clip in the 5 o'clock retroareolar position of the left breast was localized using a lateral approach. The follow-up mammogram images confirm the seed in the expected location and were marked for Dr. Marlou Starks. Follow-up survey of the patient confirms presence of the radioactive seed. Order number of I-125 seed:  [id number]. Total activity:  0.250 mCi reference Date: 05/28/2018 SITE #3: RIBBON SHAPED BIOPSY MARKER CLIP IN THE 3:30 O'CLOCK POSITION OF THE LEFT BREAST Using mammographic guidance, sterile technique, 1% lidocaine and an I-125 radioactive seed, the ribbon shaped biopsy marker clip in the 3:30 o'clock position of the left breast was localized using a lateral approach. The follow-up mammogram images confirm the  seed in the expected location and were marked for Dr. Marlou Starks. Follow-up survey of the patient confirms presence of the radioactive seed. Order number of I-125 seed:  [id number]. Total activity:  0.250 mCi reference Date: 05/28/2018 The patient tolerated the procedures well and was released from the [facility]. She was given instructions regarding seed removal. IMPRESSION: Radioactive seed localization left axilla and left breast x 2. No apparent complications. Electronically Signed   By: Claudie Revering M.D.   On: 06/13/2018 15:26   [place] Lt Radioactive Seed Loc  Result Date: 06/13/2018 CLINICAL DATA:  Recently diagnosed grade 3 invasive ductal carcinoma in the 3:30 o'clock and 5 o'clock positions of the left breast. There was a 4 mm suspicious mass between the 2 areas of malignancy on an MR dated 01/07/2018. This has not been biopsied. In talking with Dr. Marlou Starks today, he will do a bracketed excision to include this area. The patient has had neoadjuvant chemotherapy. She had a sonographically abnormal left axillary lymph node biopsied under ultrasound guidance with discordant benign results. This returned to a normal appearance following the neoadjuvant chemotherapy. This is also being localized today for excision. EXAM: MAMMOGRAPHIC GUIDED RADIOACTIVE SEED LOCALIZATION OF THE LEFT BREAST X 2 ULTRASOUND-GUIDED RADIOACTIVE SEED LOCALIZATION OF THE LEFT AXILLA COMPARISON:  Previous exam(s). FINDINGS: Patient presents for radioactive seed localization prior to left lumpectomy and targeted left axillary lymph node excision. I met with the patient and we discussed the procedure of seed localization including benefits and alternatives. We discussed the high likelihood of a successful procedure. We discussed the risks of the procedure including infection, bleeding, tissue injury and further surgery. We discussed the low dose of radioactivity involved in the procedure. Informed, written consent was given. The usual time-out  protocol was  performed immediately prior to the procedure. SITE #1: LEFT AXILLARY LYMPH NODE Using ultrasound guidance, sterile technique, 1% lidocaine and an I-125 radioactive seed, the previously biopsied left axillary lymph node and spiral shaped HydroMARK biopsy marker clip were localized using a caudal approach. The follow-up mammogram images confirm the seed in the expected location and were marked for Dr. Marlou Starks. Follow-up survey of the patient confirms presence of the radioactive seed. Order number of I-125 seed:  [id number]. Total activity:  0.252 mCi reference Date: 06/03/2018 SITE #2: COIL SHAPED BIOPSY MARKER CLIP IN THE 5 O'CLOCK RETROAREOLAR POSITION OF THE LEFT BREAST Using mammographic guidance, sterile technique, 1% lidocaine and an I-125 radioactive seed, the coil shaped biopsy marker clip in the 5 o'clock retroareolar position of the left breast was localized using a lateral approach. The follow-up mammogram images confirm the seed in the expected location and were marked for Dr. Marlou Starks. Follow-up survey of the patient confirms presence of the radioactive seed. Order number of I-125 seed:  [id number]. Total activity:  0.250 mCi reference Date: 05/28/2018 SITE #3: RIBBON SHAPED BIOPSY MARKER CLIP IN THE 3:30 O'CLOCK POSITION OF THE LEFT BREAST Using mammographic guidance, sterile technique, 1% lidocaine and an I-125 radioactive seed, the ribbon shaped biopsy marker clip in the 3:30 o'clock position of the left breast was localized using a lateral approach. The follow-up mammogram images confirm the seed in the expected location and were marked for Dr. Marlou Starks. Follow-up survey of the patient confirms presence of the radioactive seed. Order number of I-125 seed:  [id number]. Total activity:  0.250 mCi reference Date: 05/28/2018 The patient tolerated the procedures well and was released from the [facility]. She was given instructions regarding seed removal. IMPRESSION: Radioactive seed localization left  axilla and left breast x 2. No apparent complications. Electronically Signed   By: Claudie Revering M.D.   On: 06/13/2018 15:26    ELIGIBLE FOR AVAILABLE RESEARCH PROTOCOL: Participating in exact sciences blood draw study  ASSESSMENT: 52 y.o.  Ignacia Palma, [place] woman status post left breast lower outer quadrant biopsy 12/23/2017 for a clinically multifocal T2 N0, stage II invasive ductal carcinoma, grade 3, essentially estrogen receptor and progesterone receptor negative, but HER-2 amplified, with an MIB-1 of 90%.  (a) breast MRI 01/07/2018 shows the 2.6 cm main mass and 4 additional smaller masses spanning 7.1 cm; there was a suspicious left axillary lymph node  (b) left axillary lymph node biopsy 01/14/2018 was benign/discordant (no lymph node tissue)  (1) neoadjuvant chemotherapy consisting of carboplatin and docetaxel every 21 days for 6 cycles started 01/20/2018, completed 05/05/2018, given in conjunction with anti-HER-2 immunotherapy  (2) anti-HER-2 immunotherapy consisting of trastuzumab and Pertuzumab started 01/20/2018, to be continued for 6 months, lasrt dose scheduled for 07/29/2018  (a) pertuzumab discontinued after cycle 2 due to diarrhea  (b) baseline echocardiogram 01/08/2018 shows an ejection fraction in the 60-65% range  (c) echocardiogram on 04/24/2018 shows an ejection fraction in the 65-70% range  (3) left lumpectomy and sentinel lymph node biopsy 10 [id number] showed a residual ypT1a ypN0 invasive ductal carcinoma, grade 3, 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  Margins were clear.  The repeat prognostic panel shows the tumor to be estrogen and progestin receptor negative.  It was very weakly estrogen receptor positive previously.  I do not believe she would derive any benefit from antiestrogens in  terms of this cancers  recurrence and therefore these are not being prescribed.  Once she completes radiation we can consider antiestrogens prophylactically if she wishes  The HER-2 negativity is interesting.  It suggests that we may have cleared the HER-2 positive cells.  That would be a positive interpretation.  I have placed a referral to physical therapy so she can start stretching exercises as appropriate.  We will receive one last dose of trastuzumab, 3 weeks from now.  She will proceed with radiation guarding in the next week or so.  She has one last echocardiogram pending.  She will then return to see me in January.  At that point we will initiate long-term follow-up      Emmagrace Runkel, [name], MD  07/07/18 8:34 AM Medical Oncology and Hematology [facility] [address] [place], [place] [address] Tel. [phone]    Fax. [phone]    I, Soijett Blue am acting as scribe for Dr. Sarajane Jews C. Aeriel Boulay.  I, Lurline Del MD, have reviewed the above documentation for accuracy and completeness, and I agree with the above.

## 2018-07-07 ENCOUNTER — Ambulatory Visit: Payer: 59

## 2018-07-07 ENCOUNTER — Other Ambulatory Visit: Payer: 59

## 2018-07-07 ENCOUNTER — Inpatient Hospital Stay: Payer: 59

## 2018-07-07 ENCOUNTER — Inpatient Hospital Stay (HOSPITAL_BASED_OUTPATIENT_CLINIC_OR_DEPARTMENT_OTHER): Payer: 59 | Admitting: Oncology

## 2018-07-07 ENCOUNTER — Ambulatory Visit: Payer: 59 | Admitting: Nurse Practitioner

## 2018-07-07 ENCOUNTER — Inpatient Hospital Stay: Payer: 59 | Attending: Oncology

## 2018-07-07 VITALS — BP 150/82 | HR 71 | Temp 98.5°F | Resp 18 | Ht 66.5 in | Wt 196.3 lb

## 2018-07-07 DIAGNOSIS — I10 Essential (primary) hypertension: Secondary | ICD-10-CM

## 2018-07-07 DIAGNOSIS — C50512 Malignant neoplasm of lower-outer quadrant of left female breast: Secondary | ICD-10-CM

## 2018-07-07 DIAGNOSIS — Z95828 Presence of other vascular implants and grafts: Secondary | ICD-10-CM

## 2018-07-07 DIAGNOSIS — Z801 Family history of malignant neoplasm of trachea, bronchus and lung: Secondary | ICD-10-CM

## 2018-07-07 DIAGNOSIS — Z170 Estrogen receptor positive status [ER+]: Principal | ICD-10-CM

## 2018-07-07 DIAGNOSIS — Z5112 Encounter for antineoplastic immunotherapy: Secondary | ICD-10-CM | POA: Diagnosis not present

## 2018-07-07 DIAGNOSIS — Z79899 Other long term (current) drug therapy: Secondary | ICD-10-CM | POA: Insufficient documentation

## 2018-07-07 DIAGNOSIS — D509 Iron deficiency anemia, unspecified: Secondary | ICD-10-CM | POA: Insufficient documentation

## 2018-07-07 LAB — CBC WITH DIFFERENTIAL (CANCER CENTER ONLY)
Abs Immature Granulocytes: 0.02 10*3/uL (ref 0.00–0.07)
BASOS ABS: 0.1 10*3/uL (ref 0.0–0.1)
Basophils Relative: 1 %
EOS ABS: 0.7 10*3/uL — AB (ref 0.0–0.5)
EOS PCT: 11 %
HCT: 39.3 % (ref 36.0–46.0)
Hemoglobin: 12.5 g/dL (ref 12.0–15.0)
Immature Granulocytes: 0 %
Lymphocytes Relative: 26 %
Lymphs Abs: 1.8 10*3/uL (ref 0.7–4.0)
MCH: 30.9 pg (ref 26.0–34.0)
MCHC: 31.8 g/dL (ref 30.0–36.0)
MCV: 97 fL (ref 80.0–100.0)
Monocytes Absolute: 0.6 10*3/uL (ref 0.1–1.0)
Monocytes Relative: 9 %
NRBC: 0 % (ref 0.0–0.2)
Neutro Abs: 3.6 10*3/uL (ref 1.7–7.7)
Neutrophils Relative %: 53 %
Platelet Count: 198 10*3/uL (ref 150–400)
RBC: 4.05 MIL/uL (ref 3.87–5.11)
RDW: 13.3 % (ref 11.5–15.5)
WBC: 6.8 10*3/uL (ref 4.0–10.5)

## 2018-07-07 LAB — CMP (CANCER CENTER ONLY)
ALT: 18 U/L (ref 0–44)
AST: 21 U/L (ref 15–41)
Albumin: 3.5 g/dL (ref 3.5–5.0)
Alkaline Phosphatase: 91 U/L (ref 38–126)
Anion gap: 9 (ref 5–15)
BILIRUBIN TOTAL: 0.4 mg/dL (ref 0.3–1.2)
BUN: 10 mg/dL (ref 6–20)
CALCIUM: 8.9 mg/dL (ref 8.9–10.3)
CO2: 26 mmol/L (ref 22–32)
Chloride: 105 mmol/L (ref 98–111)
Creatinine: 0.74 mg/dL (ref 0.44–1.00)
Glucose, Bld: 137 mg/dL — ABNORMAL HIGH (ref 70–99)
Potassium: 3.2 mmol/L — ABNORMAL LOW (ref 3.5–5.1)
Sodium: 140 mmol/L (ref 135–145)
Total Protein: 6.9 g/dL (ref 6.5–8.1)

## 2018-07-07 MED ORDER — SODIUM CHLORIDE 0.9 % IV SOLN
Freq: Once | INTRAVENOUS | Status: AC
  Administered 2018-07-07: 09:00:00 via INTRAVENOUS
  Filled 2018-07-07: qty 250

## 2018-07-07 MED ORDER — ACETAMINOPHEN 325 MG PO TABS
650.0000 mg | ORAL_TABLET | Freq: Once | ORAL | Status: AC
  Administered 2018-07-07: 650 mg via ORAL

## 2018-07-07 MED ORDER — ACETAMINOPHEN 325 MG PO TABS
ORAL_TABLET | ORAL | Status: AC
  Filled 2018-07-07: qty 2

## 2018-07-07 MED ORDER — VITAMIN D 1000 UNITS PO TABS: 1000.0000 [IU] | ORAL_TABLET | Freq: Every day | ORAL | 4 refills | Status: DC

## 2018-07-07 MED ORDER — DIPHENHYDRAMINE HCL 25 MG PO CAPS
ORAL_CAPSULE | ORAL | Status: AC
  Filled 2018-07-07: qty 1

## 2018-07-07 MED ORDER — HEPARIN SOD (PORK) LOCK FLUSH 100 UNIT/ML IV SOLN
500.0000 [IU] | Freq: Once | INTRAVENOUS | Status: AC | PRN
  Administered 2018-07-07: 500 [IU]
  Filled 2018-07-07: qty 5

## 2018-07-07 MED ORDER — SODIUM CHLORIDE 0.9% FLUSH
10.0000 mL | Freq: Once | INTRAVENOUS | Status: AC
  Administered 2018-07-07: 10 mL
  Filled 2018-07-07: qty 10

## 2018-07-07 MED ORDER — TRASTUZUMAB CHEMO 150 MG IV SOLR
6.0000 mg/kg | Freq: Once | INTRAVENOUS | Status: AC
  Administered 2018-07-07: 525 mg via INTRAVENOUS
  Filled 2018-07-07: qty 25

## 2018-07-07 MED ORDER — DIPHENHYDRAMINE HCL 25 MG PO CAPS
25.0000 mg | ORAL_CAPSULE | Freq: Once | ORAL | Status: AC
  Administered 2018-07-07: 25 mg via ORAL

## 2018-07-07 MED ORDER — SODIUM CHLORIDE 0.9% FLUSH
10.0000 mL | INTRAVENOUS | Status: DC | PRN
  Administered 2018-07-07: 10 mL
  Filled 2018-07-07: qty 10

## 2018-07-07 NOTE — Patient Instructions (Signed)
[facility] Discharge Instructions for Patients Receiving Chemotherapy  Today you received the following chemotherapy agents herceptin.  To help prevent nausea and vomiting after your treatment, we encourage you to take your nausea medication as directed.   If you develop nausea and vomiting that is not controlled by your nausea medication, call the clinic.   BELOW ARE SYMPTOMS THAT SHOULD BE REPORTED IMMEDIATELY:  *FEVER GREATER THAN 100.5 F  *CHILLS WITH OR WITHOUT FEVER  NAUSEA AND VOMITING THAT IS NOT CONTROLLED WITH YOUR NAUSEA MEDICATION  *UNUSUAL SHORTNESS OF BREATH  *UNUSUAL BRUISING OR BLEEDING  TENDERNESS IN MOUTH AND THROAT WITH OR WITHOUT PRESENCE OF ULCERS  *URINARY PROBLEMS  *BOWEL PROBLEMS  UNUSUAL RASH Items with * indicate a potential emergency and should be followed up as soon as possible.  Feel free to call the clinic should you have any questions or concerns. The clinic phone number is [phone].  Please show the CHEMO ALERT CARD at check-in to the Emergency Department and triage nurse.   

## 2018-07-08 ENCOUNTER — Encounter: Payer: Self-pay | Admitting: Radiation Oncology

## 2018-07-08 ENCOUNTER — Ambulatory Visit
Admission: RE | Admit: 2018-07-08 | Discharge: 2018-07-08 | Disposition: A | Discharge status: Home / Self Care | Payer: 59 | Source: Ambulatory Visit | Attending: Radiation Oncology | Admitting: Radiation Oncology

## 2018-07-08 ENCOUNTER — Ambulatory Visit: Admission: RE | Admit: 2018-07-08 | Payer: 59 | Source: Ambulatory Visit | Admitting: Radiation Oncology

## 2018-07-08 ENCOUNTER — Other Ambulatory Visit: Payer: Self-pay

## 2018-07-08 VITALS — BP 125/89 | HR 76 | Temp 97.6°F | Resp 18 | Ht 66.5 in | Wt 195.4 lb

## 2018-07-08 DIAGNOSIS — Z170 Estrogen receptor positive status [ER+]: Secondary | ICD-10-CM | POA: Diagnosis not present

## 2018-07-08 DIAGNOSIS — I10 Essential (primary) hypertension: Secondary | ICD-10-CM | POA: Insufficient documentation

## 2018-07-08 DIAGNOSIS — Z9221 Personal history of antineoplastic chemotherapy: Secondary | ICD-10-CM | POA: Insufficient documentation

## 2018-07-08 DIAGNOSIS — C50512 Malignant neoplasm of lower-outer quadrant of left female breast: Secondary | ICD-10-CM | POA: Insufficient documentation

## 2018-07-08 DIAGNOSIS — Z79899 Other long term (current) drug therapy: Secondary | ICD-10-CM | POA: Insufficient documentation

## 2018-07-08 DIAGNOSIS — L7633 Postprocedural seroma of skin and subcutaneous tissue following a dermatologic procedure: Secondary | ICD-10-CM | POA: Diagnosis not present

## 2018-07-08 NOTE — Progress Notes (Signed)
Radiation Oncology         [phone] ________________________________  Name: Lindsey Wright        MRN: [id number]  Date of Service: 07/08/2018 DOB: 1966-06-24  CC:Tower, Wynelle Fanny, MD  Magrinat, [name], MD     REFERRING PHYSICIAN: Magrinat, [name], MD   DIAGNOSIS: The encounter diagnosis was Malignant neoplasm of lower-outer quadrant of 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, estrogen receptor positive ([place]).   HISTORY OF PRESENT ILLNESS: Lindsey Wright is a 52 y.o. female originally seen in the multidisciplinary breast clinic for a new diagnosis of left breast cancer. The patient was noted to have a palpable mass on examination and on diagnostic imaging was found to have 3 areas on her mammogram in the left breast. By ultrasound at 5:00 there was a 2.5 cm mass consistent with her palpable finding, and at 3:30 she had a 1.8 cm mass. There was no ultrasound correlate given her mammogram finding. The axilla was negative for adenopathy. She underwent a biopsy of the 5:00 and 3:30 sites and both were consistent with grade 3, invasive ductal carcinoma, ER weakly positive, PR negative, HER2 ampllified with a Ki 67 of 90%.  She went went MRI of the breast on 01/07/2018 which revealed 3 separate tumors in her left breast measuring 2.6 x 2.1 x 2.3 cm, 9 x 12 x 6 mm, and a 4 mm mass.  There was also an indeterminate left axillary lymph node, and subsequent biopsy was negative but did not reveal nodal tissue.  She underwent systemic treatment with chemo and HER-2 directed therapy, and MRI on 05/11/2018 revealed radiographic response in the breast and resolution of her previously abnormal lymph node.  She had an 8 mm lesion on the skin that was felt to be corresponding to a mosquito bite.  She then went on to have lumpectomy on 06/16/2018 which revealed a grade 3 invasive ductal carcinoma measuring 4 mm with high-grade DCIS, her margins were negative and 3 lymph nodes sampled were negative.  Her prognostic  results revealed ER, PR, and HER-2 negative features.  She comes today to discuss the role of adjuvant radiotherapy.   PREVIOUS RADIATION THERAPY: No   PAST MEDICAL HISTORY:  Past Medical History:  Diagnosis Date  . Anemia    iron deficient anemia  . Breast cancer ([place])   . History of Bell's palsy     x 2 as teenager and in 20's  . Hypertension   . Menorrhagia   . Overweight(278.02)   . Peripheral edema        PAST SURGICAL HISTORY: Past Surgical History:  Procedure Laterality Date  . BREAST BIOPSY Right 02/01/2015   benign  . BREAST LUMPECTOMY WITH RADIOACTIVE SEED AND SENTINEL LYMPH NODE BIOPSY Left 06/16/2018   Procedure: LEFT BREAST LUMPECTOMY WITH RADIOACTIVE SEED X'S 2 WITH SEED TARGETED LYMPH NODE EXCISION AND SENTINEL LYMPH NODE BIOPSY;  Surgeon: [name], MD;  Location: [facility];  Service: General;  Laterality: Left;  . CESAREAN SECTION CLASSICAL    . CHOLECYSTECTOMY    . COSMETIC SURGERY    . PORTACATH PLACEMENT Right 01/17/2018   Procedure: INSERTION PORT-A-CATH;  Surgeon: [name], MD;  Location: [facility];  Service: General;  Laterality: Right;  . TUBAL LIGATION       FAMILY HISTORY:  Family History  Problem Relation Age of Onset  . Hypertension Mother   . Diabetes Mother   . Stroke Mother   .  Alcohol abuse Father   . Cancer Father        lung ca  . Hypertension Father   . Hypertension Sister   . Colon cancer Neg Hx   . Colon polyps Neg Hx   . Rectal cancer Neg Hx   . Stomach cancer Neg Hx      SOCIAL HISTORY:  reports that she has never smoked. She has never used smokeless tobacco. She reports that she does not drink alcohol or use drugs.   ALLERGIES: Ace inhibitors   MEDICATIONS:  Current Outpatient Medications  Medication Sig Dispense Refill  . cholecalciferol (VITAMIN D) 1000 units tablet Take 1 tablet (1,000 Units total) by mouth daily. 100 tablet 4  . losartan (COZAAR) 50 MG tablet Take 25  mg by mouth daily.    . potassium chloride SA (KLOR-CON M20) 20 MEQ tablet Take 2 tablets (40 mEq total) by mouth daily. 60 tablet 11   No current facility-administered medications for this encounter.      REVIEW OF SYSTEMS: On review of systems, the patient reports that she is doing well overall. She denies any chest pain, shortness of breath, cough, fevers, chills, night sweats, unintended weight changes. She denies any bowel or bladder disturbances, and denies abdominal pain, nausea or vomiting. She denies any new musculoskeletal or joint aches or pains. A complete review of systems is obtained and is otherwise negative.     PHYSICAL EXAM:  Wt Readings from Last 3 Encounters:  07/07/18 196 lb 4.8 oz (89 kg)  06/16/18 197 lb 12 oz (89.7 kg)  06/09/18 198 lb 9.6 oz (90.1 kg)   Temp Readings from Last 3 Encounters:  07/07/18 98.5 F (36.9 C) (Oral)  06/16/18 97.8 F (36.6 C)  06/09/18 97.6 F (36.4 C) (Oral)   BP Readings from Last 3 Encounters:  07/07/18 (!) 150/82  06/16/18 (!) 141/90  06/09/18 (!) 141/85   Pulse Readings from Last 3 Encounters:  07/07/18 71  06/16/18 92  06/09/18 74     In general this is a well appearing African American female in no acute distress. She is alert and oriented x4 and appropriate throughout the examination. HEENT reveals that the patient is normocephalic, atraumatic. EOMs are intact. Cardiopulmonary assessment is negative for acute distress and she exhibits normal effort. Her left breast has a palpable seroma about 4-5 cm deep to the lumpectomy site and no erythema is noted.   ECOG = 1  0 - Asymptomatic (Fully active, able to carry on all predisease activities without restriction)  1 - Symptomatic but completely ambulatory (Restricted in physically strenuous activity but ambulatory and able to carry out work of a light or sedentary nature. For example, light housework, office work)  2 - Symptomatic, <50% in bed during the day  (Ambulatory and capable of all self care but unable to carry out any work activities. Up and about more than 50% of waking hours)  3 - Symptomatic, >50% in bed, but not bedbound (Capable of only limited self-care, confined to bed or chair 50% or more of waking hours)  4 - Bedbound (Completely disabled. Cannot carry on any self-care. Totally confined to bed or chair)  5 - Death   Eustace Pen MM, Creech RH, Tormey DC, et al. [phone]). "Toxicity and response criteria of the [employer]". [place] Oncol. 5 (6): 649-55    LABORATORY DATA:  Lab Results  Component Value Date   WBC 6.8 07/07/2018   HGB 12.5 07/07/2018  HCT 39.3 07/07/2018   MCV 97.0 07/07/2018   PLT 198 07/07/2018   Lab Results  Component Value Date   NA 140 07/07/2018   K 3.2 (L) 07/07/2018   CL 105 07/07/2018   CO2 26 07/07/2018   Lab Results  Component Value Date   ALT 18 07/07/2018   AST 21 07/07/2018   ALKPHOS 91 07/07/2018   BILITOT 0.4 07/07/2018      RADIOGRAPHY: Nm Sentinel Node Inj-no Rpt (breast)  Result Date: 06/16/2018 Sulfur colloid was injected by the nuclear medicine technologist for melanoma sentinel node.   Mm Breast Surgical Specimen  Result Date: 06/16/2018 CLINICAL DATA:  52 year old patient had radioactive seed bracketing of the left breast prior to lumpectomy today. EXAM: SPECIMEN RADIOGRAPH OF THE LEFT BREAST COMPARISON:  Previous exam(s). FINDINGS: Status post excision of the left breast. The 2 radioactive seeds, the ribbon shaped biopsy clip, and the coil shaped biopsy clip biopsy marker clip are present, completely intact, and were marked for pathology. IMPRESSION: Specimen radiograph of the left breast. Electronically Signed   By: Curlene Dolphin M.D.   On: 06/16/2018 09:46   Mm Breast Surgical Specimen  Result Date: 06/16/2018 CLINICAL DATA:  52 year old patient is undergoing lumpectomy and left axillary node excision today. This report refers to the left axillary  lymph node which contains a biopsy clip and was localized with a radioactive seed. EXAM: SPECIMEN RADIOGRAPH OF THE LEFT AXILLA COMPARISON:  Previous exam(s). FINDINGS: Status post excision of the left axilla. The radioactive seed and HydroMARK biopsy marker clip are present, completely intact, and were marked for pathology. Additionally, in discussion with the surgical team, there is a single surgical clip, which is present within the specimen tissue. IMPRESSION: Specimen radiograph of the left axilla. Electronically Signed   By: Curlene Dolphin M.D.   On: 06/16/2018 09:01   Mm Lt Radioactive Seed Loc Mammo Guide  Result Date: 06/13/2018 CLINICAL DATA:  Recently diagnosed grade 3 invasive ductal carcinoma in the 3:30 o'clock and 5 o'clock positions of the left breast. There was a 4 mm suspicious mass between the 2 areas of malignancy on an MR dated 01/07/2018. This has not been biopsied. In talking with Dr. Marlou Starks today, he will do a bracketed excision to include this area. The patient has had neoadjuvant chemotherapy. She had a sonographically abnormal left axillary lymph node biopsied under ultrasound guidance with discordant benign results. This returned to a normal appearance following the neoadjuvant chemotherapy. This is also being localized today for excision. EXAM: MAMMOGRAPHIC GUIDED RADIOACTIVE SEED LOCALIZATION OF THE LEFT BREAST X 2 ULTRASOUND-GUIDED RADIOACTIVE SEED LOCALIZATION OF THE LEFT AXILLA COMPARISON:  Previous exam(s). FINDINGS: Patient presents for radioactive seed localization prior to left lumpectomy and targeted left axillary lymph node excision. I met with the patient and we discussed the procedure of seed localization including benefits and alternatives. We discussed the high likelihood of a successful procedure. We discussed the risks of the procedure including infection, bleeding, tissue injury and further surgery. We discussed the low dose of radioactivity involved in the procedure.  Informed, written consent was given. The usual time-out protocol was performed immediately prior to the procedure. SITE #1: LEFT AXILLARY LYMPH NODE Using ultrasound guidance, sterile technique, 1% lidocaine and an I-125 radioactive seed, the previously biopsied left axillary lymph node and spiral shaped HydroMARK biopsy marker clip were localized using a caudal approach. The follow-up mammogram images confirm the seed in the expected location and were marked for Dr. Marlou Starks. Follow-up survey of  the patient confirms presence of the radioactive seed. Order number of I-125 seed:  [id number]. Total activity:  0.252 mCi reference Date: 06/03/2018 SITE #2: COIL SHAPED BIOPSY MARKER CLIP IN THE 5 O'CLOCK RETROAREOLAR POSITION OF THE LEFT BREAST Using mammographic guidance, sterile technique, 1% lidocaine and an I-125 radioactive seed, the coil shaped biopsy marker clip in the 5 o'clock retroareolar position of the left breast was localized using a lateral approach. The follow-up mammogram images confirm the seed in the expected location and were marked for Dr. Marlou Starks. Follow-up survey of the patient confirms presence of the radioactive seed. Order number of I-125 seed:  [id number]. Total activity:  0.250 mCi reference Date: 05/28/2018 SITE #3: RIBBON SHAPED BIOPSY MARKER CLIP IN THE 3:30 O'CLOCK POSITION OF THE LEFT BREAST Using mammographic guidance, sterile technique, 1% lidocaine and an I-125 radioactive seed, the ribbon shaped biopsy marker clip in the 3:30 o'clock position of the left breast was localized using a lateral approach. The follow-up mammogram images confirm the seed in the expected location and were marked for Dr. Marlou Starks. Follow-up survey of the patient confirms presence of the radioactive seed. Order number of I-125 seed:  [id number]. Total activity:  0.250 mCi reference Date: 05/28/2018 The patient tolerated the procedures well and was released from the [facility]. She was given instructions regarding seed  removal. IMPRESSION: Radioactive seed localization left axilla and left breast x 2. No apparent complications. Electronically Signed   By: Claudie Revering M.D.   On: 06/13/2018 15:26   Mm Lt Rad Seed Ea Add Lesion Loc Mammo  Result Date: 06/13/2018 CLINICAL DATA:  Recently diagnosed grade 3 invasive ductal carcinoma in the 3:30 o'clock and 5 o'clock positions of the left breast. There was a 4 mm suspicious mass between the 2 areas of malignancy on an MR dated 01/07/2018. This has not been biopsied. In talking with Dr. Marlou Starks today, he will do a bracketed excision to include this area. The patient has had neoadjuvant chemotherapy. She had a sonographically abnormal left axillary lymph node biopsied under ultrasound guidance with discordant benign results. This returned to a normal appearance following the neoadjuvant chemotherapy. This is also being localized today for excision. EXAM: MAMMOGRAPHIC GUIDED RADIOACTIVE SEED LOCALIZATION OF THE LEFT BREAST X 2 ULTRASOUND-GUIDED RADIOACTIVE SEED LOCALIZATION OF THE LEFT AXILLA COMPARISON:  Previous exam(s). FINDINGS: Patient presents for radioactive seed localization prior to left lumpectomy and targeted left axillary lymph node excision. I met with the patient and we discussed the procedure of seed localization including benefits and alternatives. We discussed the high likelihood of a successful procedure. We discussed the risks of the procedure including infection, bleeding, tissue injury and further surgery. We discussed the low dose of radioactivity involved in the procedure. Informed, written consent was given. The usual time-out protocol was performed immediately prior to the procedure. SITE #1: LEFT AXILLARY LYMPH NODE Using ultrasound guidance, sterile technique, 1% lidocaine and an I-125 radioactive seed, the previously biopsied left axillary lymph node and spiral shaped HydroMARK biopsy marker clip were localized using a caudal approach. The follow-up mammogram  images confirm the seed in the expected location and were marked for Dr. Marlou Starks. Follow-up survey of the patient confirms presence of the radioactive seed. Order number of I-125 seed:  [id number]. Total activity:  0.252 mCi reference Date: 06/03/2018 SITE #2: COIL SHAPED BIOPSY MARKER CLIP IN THE 5 O'CLOCK RETROAREOLAR POSITION OF THE LEFT BREAST Using mammographic guidance, sterile technique, 1% lidocaine and an I-125 radioactive seed, the coil shaped biopsy  marker clip in the 5 o'clock retroareolar position of the left breast was localized using a lateral approach. The follow-up mammogram images confirm the seed in the expected location and were marked for Dr. Marlou Starks. Follow-up survey of the patient confirms presence of the radioactive seed. Order number of I-125 seed:  [id number]. Total activity:  0.250 mCi reference Date: 05/28/2018 SITE #3: RIBBON SHAPED BIOPSY MARKER CLIP IN THE 3:30 O'CLOCK POSITION OF THE LEFT BREAST Using mammographic guidance, sterile technique, 1% lidocaine and an I-125 radioactive seed, the ribbon shaped biopsy marker clip in the 3:30 o'clock position of the left breast was localized using a lateral approach. The follow-up mammogram images confirm the seed in the expected location and were marked for Dr. Marlou Starks. Follow-up survey of the patient confirms presence of the radioactive seed. Order number of I-125 seed:  [id number]. Total activity:  0.250 mCi reference Date: 05/28/2018 The patient tolerated the procedures well and was released from the [place]. She was given instructions regarding seed removal. IMPRESSION: Radioactive seed localization left axilla and left breast x 2. No apparent complications. Electronically Signed   By: Claudie Revering M.D.   On: 06/13/2018 15:26   [place] Lt Radioactive Seed Loc  Result Date: 06/13/2018 CLINICAL DATA:  Recently diagnosed grade 3 invasive ductal carcinoma in the 3:30 o'clock and 5 o'clock positions of the left breast. There was a 4 mm suspicious  mass between the 2 areas of malignancy on an MR dated 01/07/2018. This has not been biopsied. In talking with Dr. Marlou Starks today, he will do a bracketed excision to include this area. The patient has had neoadjuvant chemotherapy. She had a sonographically abnormal left axillary lymph node biopsied under ultrasound guidance with discordant benign results. This returned to a normal appearance following the neoadjuvant chemotherapy. This is also being localized today for excision. EXAM: MAMMOGRAPHIC GUIDED RADIOACTIVE SEED LOCALIZATION OF THE LEFT BREAST X 2 ULTRASOUND-GUIDED RADIOACTIVE SEED LOCALIZATION OF THE LEFT AXILLA COMPARISON:  Previous exam(s). FINDINGS: Patient presents for radioactive seed localization prior to left lumpectomy and targeted left axillary lymph node excision. I met with the patient and we discussed the procedure of seed localization including benefits and alternatives. We discussed the high likelihood of a successful procedure. We discussed the risks of the procedure including infection, bleeding, tissue injury and further surgery. We discussed the low dose of radioactivity involved in the procedure. Informed, written consent was given. The usual time-out protocol was performed immediately prior to the procedure. SITE #1: LEFT AXILLARY LYMPH NODE Using ultrasound guidance, sterile technique, 1% lidocaine and an I-125 radioactive seed, the previously biopsied left axillary lymph node and spiral shaped HydroMARK biopsy marker clip were localized using a caudal approach. The follow-up mammogram images confirm the seed in the expected location and were marked for Dr. Marlou Starks. Follow-up survey of the patient confirms presence of the radioactive seed. Order number of I-125 seed:  [id number]. Total activity:  0.252 mCi reference Date: 06/03/2018 SITE #2: COIL SHAPED BIOPSY MARKER CLIP IN THE 5 O'CLOCK RETROAREOLAR POSITION OF THE LEFT BREAST Using mammographic guidance, sterile technique, 1% lidocaine and  an I-125 radioactive seed, the coil shaped biopsy marker clip in the 5 o'clock retroareolar position of the left breast was localized using a lateral approach. The follow-up mammogram images confirm the seed in the expected location and were marked for Dr. Marlou Starks. Follow-up survey of the patient confirms presence of the radioactive seed. Order number of I-125 seed:  [id number]. Total activity:  0.250 mCi reference Date:  05/28/2018 SITE #3: RIBBON SHAPED BIOPSY MARKER CLIP IN THE 3:30 O'CLOCK POSITION OF THE LEFT BREAST Using mammographic guidance, sterile technique, 1% lidocaine and an I-125 radioactive seed, the ribbon shaped biopsy marker clip in the 3:30 o'clock position of the left breast was localized using a lateral approach. The follow-up mammogram images confirm the seed in the expected location and were marked for Dr. Marlou Starks. Follow-up survey of the patient confirms presence of the radioactive seed. Order number of I-125 seed:  [id number]. Total activity:  0.250 mCi reference Date: 05/28/2018 The patient tolerated the procedures well and was released from the [place]. She was given instructions regarding seed removal. IMPRESSION: Radioactive seed localization left axilla and left breast x 2. No apparent complications. Electronically Signed   By: Claudie Revering M.D.   On: 06/13/2018 15:26       IMPRESSION/PLAN: 1. Stage II, cT2N0M0 grade 3, weekly ER positive, PR negative, HER2 neu amplified invasive ductal carcinoma of the left breast s/p neoadjuvant chemotherapy and herceptin with HER2 reverting to negative. Dr. Lisbeth Renshaw discusses the pathology findings and reviews the nature of invasive left breast disease. The consensus from the breast conference includes proceeding with adjuvant radiotherapy and completion of Herceptin. We discussed the risks, benefits, short, and long term effects of radiotherapy, and the patient is interested in proceeding. Dr. Lisbeth Renshaw discusses the delivery and logistics of  radiotherapy and anticipates a course of 6 1/2 weeks of radiotherapy with deep inspiration breath hold technique. Written consent is obtained and placed in the chart, a copy was provided to the patient. She will be contacted to simulate given the seroma.  2. Left breast seroma. I called Dr. Ethlyn Gallery office to see if they can work her in to be evaluated. Once this has resolved, we will move forward with simulation and treatment planning.   In a visit lasting 25 minutes, greater than 50% of the time was spent face to face discussing her case, and coordinating the patient's care.   The above documentation reflects my direct findings during this shared patient visit. Please see the separate note by Dr. Lisbeth Renshaw on this date for the remainder of the patient's plan of care.    Carola Rhine, PAC

## 2018-07-09 ENCOUNTER — Telehealth: Payer: Self-pay | Admitting: *Deleted

## 2018-07-09 NOTE — Telephone Encounter (Signed)
Spoke with the patient to inform her that she has clearance from Dr. Marlou Starks to go ahead and proceed with her radiation treatments.  I let her know that we have scheduled her for simulation on November 8th at 11 am.  I answered all of her questions and let her know to call us back if she had any additional questions.  Will continue to follow as necessary.  Gloriajean Dell. Leonie Green, BSN

## 2018-07-10 ENCOUNTER — Other Ambulatory Visit: Payer: Self-pay

## 2018-07-10 ENCOUNTER — Ambulatory Visit: Payer: 59 | Attending: Oncology | Admitting: Rehabilitation

## 2018-07-10 ENCOUNTER — Encounter: Payer: Self-pay | Admitting: Rehabilitation

## 2018-07-10 DIAGNOSIS — Z170 Estrogen receptor positive status [ER+]: Secondary | ICD-10-CM | POA: Diagnosis not present

## 2018-07-10 DIAGNOSIS — C50512 Malignant neoplasm of lower-outer quadrant of left female breast: Secondary | ICD-10-CM | POA: Insufficient documentation

## 2018-07-10 DIAGNOSIS — M25612 Stiffness of left shoulder, not elsewhere classified: Secondary | ICD-10-CM | POA: Insufficient documentation

## 2018-07-10 NOTE — Therapy (Signed)
[place], [place], [address] Phone: [phone]   Fax:  [phone]  Physical Therapy Evaluation  Patient Details  Name: Lindsey Wright MRN: [id number] Date of Birth: [birth year] Referring Provider (PT): Dr Jana Hakim   Encounter Date: 07/10/2018  PT End of Session - 07/10/18 1704    Visit Number  1    Number of Visits  1    PT Start Time  1600    PT Stop Time  5102    PT Time Calculation (min)  45 min    Activity Tolerance  Patient tolerated treatment well    Behavior During Therapy  [facility] for tasks assessed/performed       Past Medical History:  Diagnosis Date  . Anemia    iron deficient anemia  . Breast cancer ([place])   . History of Bell's palsy     x 2 as teenager and in 20's  . Hypertension   . Menorrhagia   . Overweight(278.02)   . Peripheral edema     Past Surgical History:  Procedure Laterality Date  . BREAST BIOPSY Right 02/01/2015   benign  . BREAST LUMPECTOMY WITH RADIOACTIVE SEED AND SENTINEL LYMPH NODE BIOPSY Left 06/16/2018   Procedure: LEFT BREAST LUMPECTOMY WITH RADIOACTIVE SEED X'S 2 WITH SEED TARGETED LYMPH NODE EXCISION AND SENTINEL LYMPH NODE BIOPSY;  Surgeon: [name], MD;  Location: [place];  Service: General;  Laterality: Left;  . CESAREAN SECTION CLASSICAL    . CHOLECYSTECTOMY    . COSMETIC SURGERY    . PORTACATH PLACEMENT Right 01/17/2018   Procedure: INSERTION PORT-A-CATH;  Surgeon: [name], MD;  Location: [place];  Service: General;  Laterality: Right;  . TUBAL LIGATION      There were no vitals filed for this visit.   Subjective Assessment - 07/10/18 1605    Subjective  Pt presents after Lt lumpectomy with the upper arm "feeling a little funny"     Pertinent History  neoadjuvant chemotherapy consisting of carboplatin and docetaxel every 21 days for 6 cycles started 01/20/2018, completed 05/05/2018, given in  conjunction with anti-HER-2 immunotherapy, anti-HER-2 immunotherapy consisting of trastuzumab and Pertuzumab started 01/20/2018, to be continued for 6 months, lasrt dose scheduled for 07/29/2018, left lumpectomy and sentinel lymph node biopsy 06/16/18 showed a residual ypT1a ypN0 invasive ductal carcinoma, grade 3, 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      Limitations  --   none   Patient Stated Goals  learn stretches    Currently in Pain?  No/denies         [facility] PT Assessment - 07/10/18 0001      Assessment   Medical Diagnosis  Lt breast cancer    Referring Provider (PT)  Dr Jana Hakim    Onset Date/Surgical Date  06/16/18    Hand Dominance  Right    Next MD Visit  next week    Prior Therapy  no      Precautions   Precaution Comments  cancer, lymphedema Lt      Restrictions   Weight Bearing Restrictions  No      Balance Screen   Has the patient fallen in the past 6 months  No    Has the patient had a decrease in activity level because of a fear of falling?   No    Is the patient reluctant to leave their home because of a fear  of falling?   No      Home Film/video editor residence    Living Arrangements  Spouse/significant other      Prior Function   Level of Independence  Independent    Vocation  Full time employment    Vocation Requirements  office work    Leisure  walking some now       Cognition   Overall Cognitive Status  Within Functional Limits for tasks assessed      Sensation   Additional Comments  reports some numbness Lt tricep region      Coordination   Gross Motor Movements are Fluid and Coordinated  Yes      Posture/Postural Control   Posture/Postural Control  No significant limitations      ROM / Strength   AROM / PROM / Strength  AROM;PROM      AROM   AROM Assessment Site  Shoulder    Right/Left Shoulder  Right;Left    Right Shoulder Flexion  153 Degrees    Right Shoulder  ABduction  170 Degrees    Right Shoulder Internal Rotation  90 Degrees    Right Shoulder External Rotation  80 Degrees    Right Shoulder Horizontal ABduction  40 Degrees    Left Shoulder Flexion  152 Degrees    Left Shoulder ABduction  160 Degrees    Left Shoulder Internal Rotation  90 Degrees    Left Shoulder External Rotation  80 Degrees    Left Shoulder Horizontal ABduction  40 Degrees      PROM   Overall PROM Comments  PROM WFL with pt able to get into radiation position with just " a pull"         LYMPHEDEMA/ONCOLOGY QUESTIONNAIRE - 07/10/18 1624      Type   Cancer Type  Lt breast cancer      Surgeries   Lumpectomy Date  06/16/18    Sentinel Lymph Node Biopsy Date  06/16/18    Number Lymph Nodes Removed  3   all negative     Treatment   Active Chemotherapy Treatment  No    Past Chemotherapy Treatment  Yes    Active Radiation Treatment  No    Past Radiation Treatment  No    Current Hormone Treatment  No    Past Hormone Therapy  No      What other symptoms do you have   Are you Having Heaviness or Tightness  No    Are you having Pain  No      Lymphedema Assessments   Lymphedema Assessments  Upper extremities      Right Upper Extremity Lymphedema   10 cm Proximal to Olecranon Process  31 cm    Olecranon Process  28.5 cm    10 cm Proximal to Ulnar Styloid Process  22 cm    Just Proximal to Ulnar Styloid Process  17 cm    Across Hand at [employer]  21.2 cm    At [place] of 2nd Digit  7 cm      Left Upper Extremity Lymphedema   10 cm Proximal to Olecranon Process  30.5 cm    Olecranon Process  27.8 cm    10 cm Proximal to Ulnar Styloid Process  21 cm    Just Proximal to Ulnar Styloid Process  17.4 cm    Across Hand at [employer]  21.2 cm    At [place] of 2nd  Digit  7 cm             Objective measurements completed on examination: See above findings.      [facility] PT Treatment/Exercise - 07/10/18 0001      Exercises   Exercises  Other  Exercises    Other Exercises   taught pt post-op supine dowel, butterfly stretch, seated scapular retraction, and shoulder rolls with pt performing each x 5             PT Education - 07/10/18 1704    Education Details  Stretches; dowel flexion, scapular retraction, shoulder rolls, butterfly stretch, Lymphedema signs and symptoms to look for, ABC class information     Person(s) Educated  Patient    Methods  Explanation;Demonstration;Tactile cues;Verbal cues;Handout    Comprehension  Verbalized understanding          PT Long Term Goals - 07/10/18 1709      PT LONG TERM GOAL #1   Title  Pt will be ind with stretches to get ready for radiation     Time  1    Period  Days    Status  Achieved      PT LONG TERM GOAL #2   Title  Pt will be educated on lymphedema and what to look for     Time  1    Period  Days    Status  Achieved             Plan - 07/10/18 1705    Clinical Impression Statement  [name] presents today almost 4 weeks post operatively from Lt lumpectomy and SLNB with pending radiation.  She has slight decrease in active flexion and abduction by 3-10degrees with some axillary pull.  She is able to obtain her radiation position with only a slight pull.  No signs of lymphedema upon measurements.  Pt was educated on post operative stretches, lymphedema and its precautions, and the ABC class.      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     Clinical Presentation  Evolving    Clinical Presentation due to:  mid treatment     Clinical Decision Making  Moderate    Rehab Potential  Excellent    PT Frequency  One time visit    PT Treatment/Interventions  ADLs/Self Care Home Management;Therapeutic exercise    PT Next Visit Plan  if pt returns assess status     Consulted and Agree with Plan of Care  Patient       Patient will benefit from skilled therapeutic intervention in order to improve the following deficits and impairments:  Decreased range of motion  Visit Diagnosis: Malignant neoplasm of lower-outer quadrant of 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, estrogen receptor positive (H[facility])  Stiffness of left shoulder, not elsewhere classified     Problem List Patient Active Problem List   Diagnosis Date Noted  . Syncope 02/15/2018  . Port-A-Cath in place 01/20/2018  . Malignant neoplasm of lower-outer quadrant of 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, estrogen receptor positive ([place]) 12/26/2017  .  Colon cancer screening 01/29/2017  . Overweight (BMI 25.0-29.9) 10/29/2016  . Hyperglycemia 09/25/2013  . Essential hypertension 08/15/2007    Shan Levans, PT 07/10/2018, 5:10 PM  [place], [place], [address] Phone: [phone]   Fax:  [phone]  Name: Uchenna Seufert MRN: [id number] Date of Birth: [birth year]

## 2018-07-10 NOTE — Patient Instructions (Signed)
Stretches; dowel flexion, scapular retraction, shoulder rolls, butterfly stretch Lymphedema signs and symptoms to look for ABC class information

## 2018-07-18 ENCOUNTER — Ambulatory Visit
Admission: RE | Admit: 2018-07-18 | Discharge: 2018-07-18 | Disposition: A | Discharge status: Home / Self Care | Payer: 59 | Source: Ambulatory Visit | Attending: Radiation Oncology | Admitting: Radiation Oncology

## 2018-07-18 DIAGNOSIS — C50512 Malignant neoplasm of lower-outer quadrant of left female breast: Secondary | ICD-10-CM | POA: Diagnosis present

## 2018-07-18 DIAGNOSIS — Z801 Family history of malignant neoplasm of trachea, bronchus and lung: Secondary | ICD-10-CM | POA: Diagnosis not present

## 2018-07-18 DIAGNOSIS — R609 Edema, unspecified: Secondary | ICD-10-CM | POA: Diagnosis not present

## 2018-07-18 DIAGNOSIS — Z171 Estrogen receptor negative status [ER-]: Secondary | ICD-10-CM | POA: Diagnosis not present

## 2018-07-18 DIAGNOSIS — K219 Gastro-esophageal reflux disease without esophagitis: Secondary | ICD-10-CM | POA: Diagnosis not present

## 2018-07-18 DIAGNOSIS — Z5112 Encounter for antineoplastic immunotherapy: Secondary | ICD-10-CM | POA: Diagnosis not present

## 2018-07-18 DIAGNOSIS — N920 Excessive and frequent menstruation with regular cycle: Secondary | ICD-10-CM | POA: Diagnosis not present

## 2018-07-18 DIAGNOSIS — I10 Essential (primary) hypertension: Secondary | ICD-10-CM | POA: Diagnosis not present

## 2018-07-18 DIAGNOSIS — Z800 Family history of malignant neoplasm of digestive organs: Secondary | ICD-10-CM | POA: Diagnosis not present

## 2018-07-18 DIAGNOSIS — Z170 Estrogen receptor positive status [ER+]: Principal | ICD-10-CM

## 2018-07-18 DIAGNOSIS — D509 Iron deficiency anemia, unspecified: Secondary | ICD-10-CM | POA: Diagnosis not present

## 2018-07-18 DIAGNOSIS — Z79899 Other long term (current) drug therapy: Secondary | ICD-10-CM | POA: Diagnosis not present

## 2018-07-22 ENCOUNTER — Telehealth: Payer: Self-pay | Admitting: *Deleted

## 2018-07-22 NOTE — Progress Notes (Signed)
  Radiation Oncology         [phone] ________________________________  Name: Lindsey Wright MRN: [id number]  Date: 07/18/2018  DOB: 11-Feb-1966  Optical Surface Tracking Plan:  Since intensity modulated radiotherapy (IMRT) and 3D conformal radiation treatment methods are predicated on accurate and precise positioning for treatment, intrafraction motion monitoring is medically necessary to ensure accurate and safe treatment delivery.  The ability to quantify intrafraction motion without excessive ionizing radiation dose can only be performed with optical surface tracking. Accordingly, surface imaging offers the opportunity to obtain 3D measurements of patient position throughout IMRT and 3D treatments without excessive radiation exposure.  I am ordering optical surface tracking for this patient's upcoming course of radiotherapy. ________________________________  Kyung Rudd, MD 07/22/2018 10:17 AM    Reference:   Ursula Alert, J, et al. Surface imaging-based analysis of intrafraction motion for breast radiotherapy patients.Journal of [place], n. 6, nov. 2014. ISSN [id number].   Available at: <http://www.jacmp.org/index.php/jacmp/article/view/4957>.

## 2018-07-22 NOTE — Progress Notes (Signed)
  Radiation Oncology         [phone] ________________________________  Name: Becka Lagasse MRN: [id number]  Date: 07/18/2018  DOB: 07/28/1966  DIAGNOSIS:     ICD-10-CM   1. Malignant neoplasm of lower-outer quadrant of 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, estrogen receptor positive ([place]) C50.512    Z17.0      SIMULATION AND TREATMENT PLANNING NOTE  The patient presented for simulation prior to beginning her course of radiation treatment for her diagnosis of left-sided breast cancer. The patient was placed in a supine position on a breast board. A customized vac-lock bag was constructed and this complex treatment device will be used on a daily basis during her treatment. In this fashion, a CT scan was obtained through the chest area and an isocenter was placed near the chest wall within the breast.  The patient will be planned to receive a course of radiation initially to a dose of 50.4 Gy. This will consist of a whole breast radiotherapy technique. To accomplish this, 2 customized blocks have been designed which will correspond to medial and lateral whole breast tangent fields. This treatment will be accomplished at 1.8 Gy per fraction. A forward planning technique will also be evaluated to determine if this approach improves the plan. It is anticipated that the patient will then receive a 10 Gy boost to the seroma cavity which has been contoured. This will be accomplished at 2 Gy per fraction.   This initial treatment will consist of a 3-D conformal technique. The seroma has been contoured as the primary target structure. Additionally, dose volume histograms of both this target as well as the lungs and heart will also be evaluated. Such an approach is necessary to ensure that the target area is adequately covered while the nearby critical  normal structures are adequately spared.  Plan:  The final anticipated total dose therefore will correspond to 60.4 Gy.   Special treatment procedure was  performed today due to the extra time and effort required by myself to plan and prepare this patient for deep inspiration breath hold technique.  I have determined cardiac sparing to be of benefit to this patient to prevent long term cardiac damage due to radiation of the heart.  Bellows were placed on the patient's abdomen. To facilitate cardiac sparing, the patient was coached by the radiation therapists on breath hold techniques and breathing practice was performed. Practice waveforms were obtained. The patient was then scanned while maintaining breath hold in the treatment position.  This image was then transferred over to the imaging specialist. The imaging specialist then created a fusion of the free breathing and breath hold scans using the chest wall as the stable structure. I personally reviewed the fusion in axial, coronal and sagittal image planes.  Excellent cardiac sparing was obtained.  I felt the patient is an appropriate candidate for breath hold and the patient will be treated as such.  The image fusion was then reviewed with the patient to reinforce the necessity of reproducible breath hold.    _______________________________   Jodelle Gross, MD, PhD

## 2018-07-22 NOTE — Telephone Encounter (Signed)
This RN spoke with pt per her call stating concern due to onset of " chest tightness ".  [name] states she noticed discomfort occurring after " doing the simulation for my radiation but they didn't do anything other then draw on me and use tape "  She states feeling is not constant - but is more noticeable " like if I bend over to tie my shoe "  Discomfort does not affect sleep.  She denies any SOB, heart palpitations, chest pain , or nausea.  She states concern due to feeling and " I am supposed to start radiation next Monday.  She is scheduled to see Dr Haroldine Laws 11/21 with repeat ECHO per cardiac evaluation for Herceptin therapy.  Per discussion - offered for pt to come in tomorrow for [facility] evaluation with pt stating she is working off site tomorrow and would not be able to come in.  Per further discussion and review with MD - plan is for pt to start Pepcid tonight - she will call this RN tomorrow with update on symptoms for possible visit on Thursday.

## 2018-07-24 DIAGNOSIS — Z5112 Encounter for antineoplastic immunotherapy: Secondary | ICD-10-CM | POA: Diagnosis not present

## 2018-07-24 DIAGNOSIS — C50512 Malignant neoplasm of lower-outer quadrant of left female breast: Secondary | ICD-10-CM | POA: Diagnosis not present

## 2018-07-24 DIAGNOSIS — Z171 Estrogen receptor negative status [ER-]: Secondary | ICD-10-CM | POA: Diagnosis not present

## 2018-07-25 ENCOUNTER — Ambulatory Visit
Admission: RE | Admit: 2018-07-25 | Discharge: 2018-07-25 | Disposition: A | Discharge status: Home / Self Care | Payer: 59 | Source: Ambulatory Visit | Attending: Radiation Oncology | Admitting: Radiation Oncology

## 2018-07-25 DIAGNOSIS — Z5112 Encounter for antineoplastic immunotherapy: Secondary | ICD-10-CM | POA: Diagnosis not present

## 2018-07-25 DIAGNOSIS — Z171 Estrogen receptor negative status [ER-]: Secondary | ICD-10-CM | POA: Diagnosis not present

## 2018-07-25 DIAGNOSIS — C50512 Malignant neoplasm of lower-outer quadrant of left female breast: Secondary | ICD-10-CM | POA: Diagnosis not present

## 2018-07-28 ENCOUNTER — Ambulatory Visit
Admission: RE | Admit: 2018-07-28 | Discharge: 2018-07-28 | Disposition: A | Discharge status: Home / Self Care | Payer: 59 | Source: Ambulatory Visit | Attending: Radiation Oncology | Admitting: Radiation Oncology

## 2018-07-28 DIAGNOSIS — I110 Hypertensive heart disease with heart failure: Secondary | ICD-10-CM | POA: Diagnosis not present

## 2018-07-28 DIAGNOSIS — R0683 Snoring: Secondary | ICD-10-CM | POA: Diagnosis not present

## 2018-07-28 DIAGNOSIS — K219 Gastro-esophageal reflux disease without esophagitis: Secondary | ICD-10-CM | POA: Diagnosis not present

## 2018-07-28 DIAGNOSIS — Z170 Estrogen receptor positive status [ER+]: Secondary | ICD-10-CM | POA: Diagnosis not present

## 2018-07-28 DIAGNOSIS — Z171 Estrogen receptor negative status [ER-]: Secondary | ICD-10-CM | POA: Diagnosis not present

## 2018-07-28 DIAGNOSIS — Z5112 Encounter for antineoplastic immunotherapy: Secondary | ICD-10-CM | POA: Diagnosis not present

## 2018-07-28 DIAGNOSIS — C50512 Malignant neoplasm of lower-outer quadrant of left female breast: Secondary | ICD-10-CM | POA: Diagnosis not present

## 2018-07-28 DIAGNOSIS — I10 Essential (primary) hypertension: Secondary | ICD-10-CM | POA: Diagnosis not present

## 2018-07-29 ENCOUNTER — Encounter: Payer: Self-pay | Admitting: Adult Health

## 2018-07-29 ENCOUNTER — Inpatient Hospital Stay: Payer: 59

## 2018-07-29 ENCOUNTER — Other Ambulatory Visit: Payer: 59

## 2018-07-29 ENCOUNTER — Ambulatory Visit: Payer: 59 | Admitting: Adult Health

## 2018-07-29 ENCOUNTER — Telehealth: Payer: Self-pay | Admitting: Oncology

## 2018-07-29 ENCOUNTER — Ambulatory Visit: Payer: 59

## 2018-07-29 ENCOUNTER — Ambulatory Visit
Admission: RE | Admit: 2018-07-29 | Discharge: 2018-07-29 | Disposition: A | Discharge status: Home / Self Care | Payer: 59 | Source: Ambulatory Visit | Attending: Radiation Oncology | Admitting: Radiation Oncology

## 2018-07-29 ENCOUNTER — Inpatient Hospital Stay: Payer: 59 | Attending: Oncology

## 2018-07-29 ENCOUNTER — Inpatient Hospital Stay (HOSPITAL_BASED_OUTPATIENT_CLINIC_OR_DEPARTMENT_OTHER): Payer: 59 | Admitting: Adult Health

## 2018-07-29 VITALS — BP 152/87 | HR 72 | Temp 97.8°F | Resp 18 | Ht 66.5 in | Wt 194.0 lb

## 2018-07-29 DIAGNOSIS — I10 Essential (primary) hypertension: Secondary | ICD-10-CM

## 2018-07-29 DIAGNOSIS — C50512 Malignant neoplasm of lower-outer quadrant of left female breast: Secondary | ICD-10-CM | POA: Diagnosis not present

## 2018-07-29 DIAGNOSIS — D509 Iron deficiency anemia, unspecified: Secondary | ICD-10-CM

## 2018-07-29 DIAGNOSIS — R609 Edema, unspecified: Secondary | ICD-10-CM

## 2018-07-29 DIAGNOSIS — Z170 Estrogen receptor positive status [ER+]: Secondary | ICD-10-CM

## 2018-07-29 DIAGNOSIS — N920 Excessive and frequent menstruation with regular cycle: Secondary | ICD-10-CM | POA: Insufficient documentation

## 2018-07-29 DIAGNOSIS — Z95828 Presence of other vascular implants and grafts: Secondary | ICD-10-CM

## 2018-07-29 DIAGNOSIS — Z171 Estrogen receptor negative status [ER-]: Secondary | ICD-10-CM

## 2018-07-29 DIAGNOSIS — Z5112 Encounter for antineoplastic immunotherapy: Secondary | ICD-10-CM | POA: Insufficient documentation

## 2018-07-29 DIAGNOSIS — Z800 Family history of malignant neoplasm of digestive organs: Secondary | ICD-10-CM | POA: Insufficient documentation

## 2018-07-29 DIAGNOSIS — Z801 Family history of malignant neoplasm of trachea, bronchus and lung: Secondary | ICD-10-CM | POA: Insufficient documentation

## 2018-07-29 DIAGNOSIS — K219 Gastro-esophageal reflux disease without esophagitis: Secondary | ICD-10-CM | POA: Insufficient documentation

## 2018-07-29 DIAGNOSIS — Z79899 Other long term (current) drug therapy: Secondary | ICD-10-CM | POA: Insufficient documentation

## 2018-07-29 LAB — CBC WITH DIFFERENTIAL (CANCER CENTER ONLY)
ABS IMMATURE GRANULOCYTES: 0.01 10*3/uL (ref 0.00–0.07)
BASOS ABS: 0.1 10*3/uL (ref 0.0–0.1)
Basophils Relative: 1 %
Eosinophils Absolute: 0.3 10*3/uL (ref 0.0–0.5)
Eosinophils Relative: 4 %
HEMATOCRIT: 38.4 % (ref 36.0–46.0)
HEMOGLOBIN: 12.6 g/dL (ref 12.0–15.0)
IMMATURE GRANULOCYTES: 0 %
LYMPHS ABS: 1.8 10*3/uL (ref 0.7–4.0)
LYMPHS PCT: 25 %
MCH: 30.7 pg (ref 26.0–34.0)
MCHC: 32.8 g/dL (ref 30.0–36.0)
MCV: 93.7 fL (ref 80.0–100.0)
Monocytes Absolute: 0.7 10*3/uL (ref 0.1–1.0)
Monocytes Relative: 9 %
NEUTROS ABS: 4.4 10*3/uL (ref 1.7–7.7)
NEUTROS PCT: 61 %
NRBC: 0 % (ref 0.0–0.2)
Platelet Count: 214 10*3/uL (ref 150–400)
RBC: 4.1 MIL/uL (ref 3.87–5.11)
RDW: 13.2 % (ref 11.5–15.5)
WBC Count: 7.2 10*3/uL (ref 4.0–10.5)

## 2018-07-29 LAB — CMP (CANCER CENTER ONLY)
ALT: 18 U/L (ref 0–44)
ANION GAP: 9 (ref 5–15)
AST: 22 U/L (ref 15–41)
Albumin: 3.6 g/dL (ref 3.5–5.0)
Alkaline Phosphatase: 112 U/L (ref 38–126)
BUN: 11 mg/dL (ref 6–20)
CHLORIDE: 104 mmol/L (ref 98–111)
CO2: 28 mmol/L (ref 22–32)
CREATININE: 0.84 mg/dL (ref 0.44–1.00)
Calcium: 9.1 mg/dL (ref 8.9–10.3)
GFR, Est AFR Am: >60 mL/min (ref >60)
GFR, Estimated: >60 mL/min (ref >60)
Glucose, Bld: 161 mg/dL — ABNORMAL HIGH (ref 70–99)
POTASSIUM: 3.5 mmol/L (ref 3.5–5.1)
SODIUM: 141 mmol/L (ref 135–145)
Total Bilirubin: 0.5 mg/dL (ref 0.3–1.2)
Total Protein: 7.2 g/dL (ref 6.5–8.1)

## 2018-07-29 MED ORDER — ACETAMINOPHEN 325 MG PO TABS
650.0000 mg | ORAL_TABLET | Freq: Once | ORAL | Status: AC
  Administered 2018-07-29: 650 mg via ORAL

## 2018-07-29 MED ORDER — SODIUM CHLORIDE 0.9% FLUSH
10.0000 mL | Freq: Once | INTRAVENOUS | Status: AC
  Administered 2018-07-29: 10 mL
  Filled 2018-07-29: qty 10

## 2018-07-29 MED ORDER — TRASTUZUMAB CHEMO 150 MG IV SOLR
6.0000 mg/kg | Freq: Once | INTRAVENOUS | Status: AC
  Administered 2018-07-29: 525 mg via INTRAVENOUS
  Filled 2018-07-29: qty 25

## 2018-07-29 MED ORDER — DIPHENHYDRAMINE HCL 25 MG PO CAPS
ORAL_CAPSULE | ORAL | Status: AC
  Filled 2018-07-29: qty 1

## 2018-07-29 MED ORDER — SODIUM CHLORIDE 0.9 % IV SOLN
Freq: Once | INTRAVENOUS | Status: AC
  Administered 2018-07-29: 15:00:00 via INTRAVENOUS
  Filled 2018-07-29: qty 250

## 2018-07-29 MED ORDER — HEPARIN SOD (PORK) LOCK FLUSH 100 UNIT/ML IV SOLN
500.0000 [IU] | Freq: Once | INTRAVENOUS | Status: AC | PRN
  Administered 2018-07-29: 500 [IU]
  Filled 2018-07-29: qty 5

## 2018-07-29 MED ORDER — DIPHENHYDRAMINE HCL 25 MG PO CAPS
25.0000 mg | ORAL_CAPSULE | Freq: Once | ORAL | Status: AC
  Administered 2018-07-29: 25 mg via ORAL

## 2018-07-29 MED ORDER — SODIUM CHLORIDE 0.9% FLUSH
10.0000 mL | INTRAVENOUS | Status: DC | PRN
  Administered 2018-07-29: 10 mL
  Filled 2018-07-29: qty 10

## 2018-07-29 MED ORDER — ACETAMINOPHEN 325 MG PO TABS
ORAL_TABLET | ORAL | Status: AC
  Filled 2018-07-29: qty 2

## 2018-07-29 NOTE — Patient Instructions (Signed)
[facility] Discharge Instructions for Patients Receiving Chemotherapy  Today you received the following chemotherapy agents herceptin.  To help prevent nausea and vomiting after your treatment, we encourage you to take your nausea medication as directed.   If you develop nausea and vomiting that is not controlled by your nausea medication, call the clinic.   BELOW ARE SYMPTOMS THAT SHOULD BE REPORTED IMMEDIATELY:  *FEVER GREATER THAN 100.5 F  *CHILLS WITH OR WITHOUT FEVER  NAUSEA AND VOMITING THAT IS NOT CONTROLLED WITH YOUR NAUSEA MEDICATION  *UNUSUAL SHORTNESS OF BREATH  *UNUSUAL BRUISING OR BLEEDING  TENDERNESS IN MOUTH AND THROAT WITH OR WITHOUT PRESENCE OF ULCERS  *URINARY PROBLEMS  *BOWEL PROBLEMS  UNUSUAL RASH Items with * indicate a potential emergency and should be followed up as soon as possible.  Feel free to call the clinic should you have any questions or concerns. The clinic phone number is [phone].  Please show the CHEMO ALERT CARD at check-in to the Emergency Department and triage nurse.   

## 2018-07-29 NOTE — Progress Notes (Signed)
[place]  [phone] [phone]     ID: Lindsey Wright DOB: 02/14/1966  MR#: [id number]  FBP#:[id number]  Patient Care Team: [name], MD as PCP - General Magrinat, [name], MD as Consulting Physician (Oncology) Vania Rea, MD as Consulting Physician (Obstetrics and Gynecology) [name], MD as Consulting Physician (General Surgery) Kyung Rudd, MD as Consulting Physician (Radiation Oncology) OTHER MD:   CHIEF COMPLAINT: HER-2 positive breast cancer  CURRENT TREATMENT: completed Neoadjuvant chemotherapy, trastuzumab/adjuvant radiation  INTERVAL HISTORY: Lindsey Wright returns today for a follow-up and treatment of her HER-2 positive breast cancer. She is here to receive Trastuzumab. She has been doing well overall.   Most recent echocardiogram on 04/24/2018 showed an ejection fraction of 65-70%  She started radiation yesterday and so far is tolerating it well.     REVIEW OF SYSTEMS: [name] reports having some acid reflux that she started taking pepcid for and it helped.  She has upcoming appt with Dr. Haroldine Laws later this week.  She denies any unusual headaches or vision changes.  She is without any chest pain, palpitations, cough, shortness of breath.  She isn't having any bowel/bladder issues, nausea, vomiting, or any other concerns today.     HISTORY OF CURRENT ILLNESS: From the original intake note:  "Lindsey Wright" palpated a mass in the left breast about 2 weeks ago. She followed up with her gynecologist, Dr. Jean Rosenthal who referred her to the [facility] for mammography. She underwent unilateral left diagnostic mammography with tomography and left breast ultrasonography at [facility] on 12/20/2017 showing: breast density category B. Suspicious newly palpable left breast mass at the 5 o'clock lower outer position with internal blow flow measuring 2.3 x 1.8 x 2.5 cm. There are either 2 adjacent masses or a single complicated  mass at the 3:30 lower outer position, 4 cm from the nipple measuring 1.8 x 0.5 by 0.9 cm which may represent fibrocystic changes versus a solid mass. No other suspicious findings. Ultrasound showed normal axillary nodes.  Accordingly on 12/23/2017 she proceeded to biopsy of the left breast areas in question. The pathology from this procedure showed ([id number]): At both the 5 o'clock spanning 1.2 cm and 3:30 position spanning 0.7 cm, invasive ductal carcinoma, grade III. Prognostic indicators significant for: estrogen receptor, 10% positive with weak staining intensity and progesterone receptor, 0% negative. Proliferation marker Ki67 at 90%. HER2 amplified with ratios ER2/CEP17 signals 2.33 and average HER2 copies per cell 4.65  The patient's subsequent history is as detailed below.   PAST MEDICAL HISTORY: Past Medical History:  Diagnosis Date  . Anemia    iron deficient anemia  . Breast cancer ([place])   . History of Bell's palsy     x 2 as teenager and in 20's  . Hypertension   . Menorrhagia   . Overweight(278.02)   . Peripheral edema     PAST SURGICAL HISTORY: Past Surgical History:  Procedure Laterality Date  . BREAST BIOPSY Right 02/01/2015   benign  . BREAST LUMPECTOMY WITH RADIOACTIVE SEED AND SENTINEL LYMPH NODE BIOPSY Left 06/16/2018   Procedure: LEFT BREAST LUMPECTOMY WITH RADIOACTIVE SEED X'S 2 WITH SEED TARGETED LYMPH NODE EXCISION AND SENTINEL LYMPH NODE BIOPSY;  Surgeon: [name], MD;  Location: [facility];  Service: General;  Laterality: Left;  . CESAREAN SECTION CLASSICAL    . CHOLECYSTECTOMY    . COSMETIC SURGERY    . PORTACATH PLACEMENT Right 01/17/2018   Procedure: INSERTION PORT-A-CATH;  Surgeon: [name], MD;  Location: [facility];  Service: General;  Laterality: Right;  . TUBAL LIGATION      FAMILY HISTORY Family History  Problem Relation Age of Onset  . Hypertension Mother   . Diabetes Mother   . Stroke Mother   .  Alcohol abuse Father   . Cancer Father        lung ca  . Hypertension Father   . Hypertension Sister   . Colon cancer Neg Hx   . Colon polyps Neg Hx   . Rectal cancer Neg Hx   . Stomach cancer Neg Hx    The patient's father was diagnosed with lung cancer at age 39 and died the same year. The patient's mother died at age 39 due to several strokes. The patient had 1 brother who died due to strokes and possibly a MI. The patient has 1 sister. She denies a history of breast or ovarian cancer in the family.    GYNECOLOGIC HISTORY:  No LMP recorded. (Menstrual status: Perimenopausal). Menarche: 52 years old Age at first live birth: 52 years old The patient is GXP2. The patient is not having periods, with her LMP being in 2010. She used oral contraceptive for about 10 years with no complications. She never used HRT.    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 Her husband, [name], works part time in Therapist, art.  The patient's daughter Lindsey Wright age 18, is a Ship broker and starting a job. The patient's daughter Lindsey Wright age 96, is a Ship broker.  Both of the patient's daughters live with her.      ADVANCED DIRECTIVES:    HEALTH MAINTENANCE: Social History   Tobacco Use  . Smoking status: Never Smoker  . Smokeless tobacco: Never Used  Substance Use Topics  . Alcohol use: No    Alcohol/week: 0.0 standard drinks  . Drug use: No     Colonoscopy: 03/27/2017 polyp removal/ Dr. Deeann Saint  PAP: May 2017 normal  Bone density:   Allergies  Allergen Reactions  . Ace Inhibitors Cough    Current Outpatient Medications  Medication Sig Dispense Refill  . cholecalciferol (VITAMIN D) 1000 units tablet Take 1 tablet (1,000 Units total) by mouth daily. 100 tablet 4  . losartan (COZAAR) 50 MG tablet Take 25 mg by mouth daily.    . potassium chloride SA (KLOR-CON M20) 20 MEQ tablet Take 2 tablets (40 mEq total) by mouth daily. 60 tablet 11   No current  facility-administered medications for this visit.     OBJECTIVE:  Vitals:   07/29/18 1447  BP: (!) 152/87  Pulse: 72  Resp: 18  Temp: 97.8 F (36.6 C)  SpO2: 93%     Body mass index is 30.84 kg/m.   Wt Readings from Last 3 Encounters:  07/29/18 194 lb (88 kg)  07/08/18 195 lb 6.4 oz (88.6 kg)  07/07/18 196 lb 4.8 oz (89 kg)  ECOG FS:1 GENERAL: Patient is a well appearing female in no acute distress HEENT:  Sclerae anicteric.  Oropharynx clear and moist. No ulcerations or evidence of oropharyngeal candidiasis. Neck is supple.  NODES:  No cervical, supraclavicular, or axillary lymphadenopathy palpated.  BREAST EXAM:  Breasts visually inspected, as she has started adjuvant radiation LUNGS:  Clear to auscultation bilaterally.  No wheezes or rhonchi. HEART:  Regular rate and rhythm. No murmur appreciated. ABDOMEN:  Soft, nontender.  Positive, normoactive bowel sounds. No organomegaly palpated. MSK:  No focal  spinal tenderness to palpation. Full range of motion bilaterally in the upper extremities. EXTREMITIES:  No peripheral edema.   SKIN:  Clear with no obvious rashes or skin changes. No nail dyscrasia. NEURO:  Nonfocal. Well oriented.  Appropriate affect.     LAB RESULTS:  CMP     Component Value Date/Time   NA 140 07/07/2018 0804   K 3.2 (L) 07/07/2018 0804   CL 105 07/07/2018 0804   CO2 26 07/07/2018 0804   GLUCOSE 137 (H) 07/07/2018 0804   BUN 10 07/07/2018 0804   CREATININE 0.74 07/07/2018 0804   CALCIUM 8.9 07/07/2018 0804   PROT 6.9 07/07/2018 0804   ALBUMIN 3.5 07/07/2018 0804   AST 21 07/07/2018 0804   ALT 18 07/07/2018 0804   ALKPHOS 91 07/07/2018 0804   BILITOT 0.4 07/07/2018 0804   GFRNONAA >60 07/07/2018 0804   GFRAA >60 07/07/2018 0804    No results found for: TOTALPROTELP, ALBUMINELP, A1GS, A2GS, BETS, BETA2SER, GAMS, MSPIKE, SPEI  No results found for: KPAFRELGTCHN, LAMBDASER, KAPLAMBRATIO  Lab Results  Component Value Date   WBC 7.2  07/29/2018   NEUTROABS 4.4 07/29/2018   HGB 12.6 07/29/2018   HCT 38.4 07/29/2018   MCV 93.7 07/29/2018   PLT 214 07/29/2018    _0 @  No results found for: LABCA2  No components found for: EUMPNT614  No results for input(s): INR in the last 168 hours.  No results found for: LABCA2  No results found for: ERX540  No results found for: GQQ761  No results found for: PJK932  No results found for: CA2729  No components found for: HGQUANT  No results found for: CEA1 / No results found for: CEA1   No results found for: AFPTUMOR  No results found for: [place]  No results found for: PSA1  Appointment on 07/29/2018  Component Date Value Ref Range Status  . WBC Count 07/29/2018 7.2  4.0 - 10.5 K/uL Final  . RBC 07/29/2018 4.10  3.87 - 5.11 MIL/uL Final  . Hemoglobin 07/29/2018 12.6  12.0 - 15.0 g/dL Final  . HCT 07/29/2018 38.4  36.0 - 46.0 % Final  . MCV 07/29/2018 93.7  80.0 - 100.0 fL Final  . MCH 07/29/2018 30.7  26.0 - 34.0 pg Final  . MCHC 07/29/2018 32.8  30.0 - 36.0 g/dL Final  . RDW 07/29/2018 13.2  11.5 - 15.5 % Final  . Platelet Count 07/29/2018 214  150 - 400 K/uL Final  . nRBC 07/29/2018 0.0  0.0 - 0.2 % Final  . Neutrophils Relative % 07/29/2018 61  % Final  . Neutro Abs 07/29/2018 4.4  1.7 - 7.7 K/uL Final  . Lymphocytes Relative 07/29/2018 25  % Final  . Lymphs Abs 07/29/2018 1.8  0.7 - 4.0 K/uL Final  . Monocytes Relative 07/29/2018 9  % Final  . Monocytes Absolute 07/29/2018 0.7  0.1 - 1.0 K/uL Final  . Eosinophils Relative 07/29/2018 4  % Final  . Eosinophils Absolute 07/29/2018 0.3  0.0 - 0.5 K/uL Final  . Basophils Relative 07/29/2018 1  % Final  . Basophils Absolute 07/29/2018 0.1  0.0 - 0.1 K/uL Final  . Immature Granulocytes 07/29/2018 0  % Final  . Abs Immature Granulocytes 07/29/2018 0.01  0.00 - 0.07 K/uL Final   Performed at [facility], [place] [address], [place], [place] [address]    (this displays  the last labs from the last 3 days)  No results found for: TOTALPROTELP, ALBUMINELP, A1GS, A2GS,  BETS, BETA2SER, GAMS, MSPIKE, SPEI (this displays SPEP labs)  No results found for: KPAFRELGTCHN, LAMBDASER, KAPLAMBRATIO (kappa/lambda light chains)  No results found for: HGBA, HGBA2QUANT, HGBFQUANT, HGBSQUAN (Hemoglobinopathy evaluation)   No results found for: LDH  Lab Results  Component Value Date   IRON 104 09/27/2008   IRONPCTSAT 25.1 09/27/2008   (Iron and TIBC)  No results found for: FERRITIN  Urinalysis    Component Value Date/Time   COLORURINE YELLOW 02/15/2018 2014   APPEARANCEUR CLEAR 02/15/2018 2014   LABSPEC 1.011 02/15/2018 2014   PHURINE 7.0 02/15/2018 2014   [place] 02/15/2018 2014   [place] (A) 02/15/2018 2014   [place] NEGATIVE 02/15/2018 2014   [place] NEGATIVE 02/15/2018 2014   [place] NEGATIVE 02/15/2018 2014   NITRITE NEGATIVE 02/15/2018 2014   LEUKOCYTESUR NEGATIVE 02/15/2018 2014     STUDIES: No results found.  ELIGIBLE FOR AVAILABLE RESEARCH PROTOCOL: Participating in exact sciences blood draw study  ASSESSMENT: 52 y.o.  Ignacia Palma, [place] woman status post left breast lower outer quadrant biopsy 12/23/2017 for a clinically multifocal T2 N0, stage II invasive ductal carcinoma, grade 3, essentially estrogen receptor and progesterone receptor negative, but HER-2 amplified, with an MIB-1 of 90%.  (a) breast MRI 01/07/2018 shows the 2.6 cm main mass and 4 additional smaller masses spanning 7.1 cm; there was a suspicious left axillary lymph node  (b) left axillary lymph node biopsy 01/14/2018 was benign/discordant (no lymph node tissue)  (1) neoadjuvant chemotherapy consisting of carboplatin and docetaxel every 21 days for 6 cycles started 01/20/2018, completed 05/05/2018, given in conjunction with anti-HER-2 immunotherapy  (2) anti-HER-2 immunotherapy consisting of trastuzumab and Pertuzumab started 01/20/2018, to be continued  for 6 months, last dose scheduled for 07/29/2018  (a) pertuzumab discontinued after cycle 2 due to diarrhea  (b) baseline echocardiogram 01/08/2018 shows an ejection fraction in the 60-65% range  (c) echocardiogram on 04/24/2018 shows an ejection fraction in the 65-70% range  (3) left lumpectomy and sentinel lymph node biopsy 10 007 2019 showed a residual ypT1a ypN0 invasive ductal carcinoma, grade 3, with a repeat prognostic panel now triple negative  (4) adjuvant radiation pending  PLAN:  Lindsey Wright is doing well today.  Her labs are stable thus far and she will proceed with her final Trastuzumab per Dr. Jana Hakim plan.  She will continue with radiation.  She has no clinical sign of recurrence.    Lindsey Wright and I reviewed that she will need to wait until after radiation to have her port removed.  I added a flush onto her January appointment with Dr. Jana Hakim just in case she hasn't had it taken out by then.    Lindsey Wright and I reviewed healthy diet and exercise.    Lindsey Wright will undergo echocardiogram and f/u with Dr. Haroldine Laws on 07/31/2018 and will see Dr. Jana Hakim on 10/07/2018.  She will return for Survivorship Care Plan visit in 12/2018.  I reviewed with her what that visit will entail.    Deliane knows to call us for any questions or concerns prior to her next appointment with [place].    A total of (20) minutes of face-to-face time was spent with this patient with greater than 50% of that time in counseling and care-coordination.    Wilber Bihari, NP  07/29/18 2:49 PM Medical Oncology and Hematology [facility] [address] [place], [place] [address] Tel. [phone]    Fax. [phone]

## 2018-07-29 NOTE — Telephone Encounter (Signed)
Gave avs and calendar ° °

## 2018-07-30 ENCOUNTER — Ambulatory Visit
Admission: RE | Admit: 2018-07-30 | Discharge: 2018-07-30 | Disposition: A | Discharge status: Home / Self Care | Payer: 59 | Source: Ambulatory Visit | Attending: Radiation Oncology | Admitting: Radiation Oncology

## 2018-07-30 ENCOUNTER — Encounter: Payer: Self-pay | Admitting: *Deleted

## 2018-07-30 DIAGNOSIS — C50512 Malignant neoplasm of lower-outer quadrant of left female breast: Secondary | ICD-10-CM | POA: Diagnosis not present

## 2018-07-31 ENCOUNTER — Ambulatory Visit (HOSPITAL_COMMUNITY)
Admission: RE | Admit: 2018-07-31 | Discharge: 2018-07-31 | Disposition: A | Discharge status: Home / Self Care | Payer: 59 | Source: Ambulatory Visit | Attending: Internal Medicine | Admitting: Internal Medicine

## 2018-07-31 ENCOUNTER — Ambulatory Visit (HOSPITAL_BASED_OUTPATIENT_CLINIC_OR_DEPARTMENT_OTHER)
Admission: RE | Admit: 2018-07-31 | Discharge: 2018-07-31 | Disposition: A | Discharge status: Home / Self Care | Payer: 59 | Source: Ambulatory Visit | Attending: Internal Medicine | Admitting: Internal Medicine

## 2018-07-31 ENCOUNTER — Ambulatory Visit
Admission: RE | Admit: 2018-07-31 | Discharge: 2018-07-31 | Disposition: A | Discharge status: Home / Self Care | Payer: 59 | Source: Ambulatory Visit | Attending: Radiation Oncology | Admitting: Radiation Oncology

## 2018-07-31 ENCOUNTER — Encounter (HOSPITAL_COMMUNITY): Payer: Self-pay | Admitting: Internal Medicine

## 2018-07-31 VITALS — BP 170/90 | HR 83 | Wt 193.2 lb

## 2018-07-31 DIAGNOSIS — I509 Heart failure, unspecified: Secondary | ICD-10-CM | POA: Insufficient documentation

## 2018-07-31 DIAGNOSIS — Z79899 Other long term (current) drug therapy: Secondary | ICD-10-CM | POA: Insufficient documentation

## 2018-07-31 DIAGNOSIS — I10 Essential (primary) hypertension: Secondary | ICD-10-CM | POA: Diagnosis not present

## 2018-07-31 DIAGNOSIS — Z6830 Body mass index (BMI) 30.0-30.9, adult: Secondary | ICD-10-CM | POA: Insufficient documentation

## 2018-07-31 DIAGNOSIS — Z170 Estrogen receptor positive status [ER+]: Secondary | ICD-10-CM

## 2018-07-31 DIAGNOSIS — C50512 Malignant neoplasm of lower-outer quadrant of left female breast: Secondary | ICD-10-CM | POA: Diagnosis present

## 2018-07-31 DIAGNOSIS — Z801 Family history of malignant neoplasm of trachea, bronchus and lung: Secondary | ICD-10-CM | POA: Insufficient documentation

## 2018-07-31 DIAGNOSIS — R0683 Snoring: Secondary | ICD-10-CM | POA: Insufficient documentation

## 2018-07-31 DIAGNOSIS — Z8249 Family history of ischemic heart disease and other diseases of the circulatory system: Secondary | ICD-10-CM | POA: Diagnosis not present

## 2018-07-31 DIAGNOSIS — I110 Hypertensive heart disease with heart failure: Secondary | ICD-10-CM | POA: Diagnosis not present

## 2018-07-31 DIAGNOSIS — E669 Obesity, unspecified: Secondary | ICD-10-CM | POA: Diagnosis not present

## 2018-07-31 LAB — ECHOCARDIOGRAM COMPLETE: Weight: 3091.2 oz

## 2018-07-31 NOTE — Progress Notes (Signed)
Cardio-Oncology Clinic Consult Note   Referring Physician: Dr. Jana Hakim Primary Care: Dr. Wynelle Fanny Tower  HPI:  Lindsey Wright is a 52 y.o. female with past medical history of HTN, HER-2 positive breast cancer who has been referred by Dr. Jana Hakim to establish in the cardio-oncology clinic for monitoring of cardio-toxicity while undergoing chemotherapy.  Cancer Profile  Left breast, lower outer quadrant    12/23/17 Initial Diagnosis          Breast MRI 01/07/2018   2.6 cm main mass and 4 additional smaller masses spanning 7.1 cm; there was a suspicious left axillary lymph node    Left Axillary lymph node biopsy 01/14/2018 Benign/discordant (no lymph node tissue.    Treatment Plan  (1) Neoadjuvant chemotherapy will consist of carboplatin and docetaxel every 21 days for 6 cycles starting 01/20/2018 (2) Anti-HER-2 immunotherapy will consist of trastuzumab and Pertuzumab, to be continued for 6 months, starting concurrently with chemotherapy (pertuzumab discontinued after cycle 2 due to diarrhea)             (a) baseline echocardiogram 01/08/2018 shows an ejection fraction in the 60-65% range (3) Definitive surgery pending (4) Adjuvant radiation as appropriate (5) Consider antiestrogens given the weak receptor positivity  She denies any h/o of known heart disease. Works for [employer]. Finished Heceptin last week. Now getting XRT. No SOB, edema or CP.   Echo today shows  65% Moderate LVH  Grade 1 DD Personally reviewed   Echo 01/08/2018 LVEF 60-65%, Grade 1 DD, GLS -19%. Normal LV size, severe LVH.    Past Medical History:  Diagnosis Date  . Anemia    iron deficient anemia  . Breast cancer ([place])   . History of Bell's palsy     x 2 as teenager and in 20's  . Hypertension   . Menorrhagia   . Overweight(278.02)   . Peripheral edema    Current Outpatient Medications  Medication Sig Dispense Refill  . cholecalciferol (VITAMIN D) 1000 units tablet Take 1 tablet  (1,000 Units total) by mouth daily. 100 tablet 4  . losartan (COZAAR) 50 MG tablet Take 25 mg by mouth daily.    . potassium chloride SA (KLOR-CON M20) 20 MEQ tablet Take 2 tablets (40 mEq total) by mouth daily. 60 tablet 11   No current facility-administered medications for this encounter.    Allergies  Allergen Reactions  . Ace Inhibitors Cough   Social History   Socioeconomic History  . Marital status: Married    Spouse name: Not on file  . Number of children: Not on file  . Years of education: Not on file  . Highest education level: Not on file  Occupational History  . Not on file  Social Needs  . Financial resource strain: Not on file  . Food insecurity:    Worry: Not on file    Inability: Not on file  . Transportation needs:    Medical: No    Non-medical: No  Tobacco Use  . Smoking status: Never Smoker  . Smokeless tobacco: Never Used  Substance and Sexual Activity  . Alcohol use: No    Alcohol/week: 0.0 standard drinks  . Drug use: No  . Sexual activity: Not Currently    Birth control/protection: Post-menopausal    Comment: BTL  Lifestyle  . Physical activity:    Days per week: Not on file    Minutes per session: Not on file  . Stress: Not on file  Relationships  . Social  connections:    Talks on phone: Not on file    Gets together: Not on file    Attends religious service: Not on file    Active member of club or organization: Not on file    Attends meetings of clubs or organizations: Not on file    Relationship status: Not on file  . Intimate partner violence:    Fear of current or ex partner: No    Emotionally abused: No    Physically abused: No    Forced sexual activity: No  Other Topics Concern  . Not on file  Social History Narrative  . Not on file   Family History  Problem Relation Age of Onset  . Hypertension Mother   . Diabetes Mother   . Stroke Mother   . Alcohol abuse Father   . Cancer Father        lung ca  . Hypertension Father     . Hypertension Sister   . Colon cancer Neg Hx   . Colon polyps Neg Hx   . Rectal cancer Neg Hx   . Stomach cancer Neg Hx    Vitals:   07/31/18 0846  BP: (!) 170/90  Pulse: 83  SpO2: 98%  Weight: 87.6 kg (193 lb 3.2 oz)   PHYSICAL EXAM: General:  Well appearing. No resp difficulty HEENT: normal Neck: supple. no JVD. Carotids 2+ bilat; no bruits. No lymphadenopathy or thryomegaly appreciated. Cor: PMI nondisplaced. Regular rate & rhythm. No rubs, gallops or murmurs. Lungs: clear Abdomen: soft, nontender, nondistended. No hepatosplenomegaly. No bruits or masses. Good bowel sounds. Extremities: no cyanosis, clubbing, rash, edema Neuro: alert & orientedx3, cranial nerves grossly intact. moves all 4 extremities w/o difficulty. Affect pleasant    ASSESSMENT & PLAN:  1. HER-2 positive breast cancer, Left breast, lower outer quadrant - Has completed Herceptin. Echo today with no evidence of cardiotoxicity. F/u PRN.  2. Snoring - will need sleep study.  She will discuss with Dr. Glori Bickers (PCP) 3. HTN  - BP very high today in looking back at flowsheet SBP typically 120-140.  - Will follow with PCP. If SBP remains . 140 will need treatment  Glori Bickers, MD  9:28 AM

## 2018-07-31 NOTE — Patient Instructions (Signed)
Follow up as needed with Dr. Haroldine Laws

## 2018-07-31 NOTE — Progress Notes (Signed)
  Echocardiogram 2D Echocardiogram has been performed.  Lindsey Wright 07/31/2018, 8:50 AM

## 2018-07-31 NOTE — Addendum Note (Signed)
Encounter addended by: Marlise Eves, RN on: 07/31/2018 9:35 AM  Actions taken: Sign clinical note

## 2018-08-01 ENCOUNTER — Ambulatory Visit
Admission: RE | Admit: 2018-08-01 | Discharge: 2018-08-01 | Disposition: A | Discharge status: Home / Self Care | Payer: 59 | Source: Ambulatory Visit | Attending: Radiation Oncology | Admitting: Radiation Oncology

## 2018-08-01 DIAGNOSIS — Z170 Estrogen receptor positive status [ER+]: Principal | ICD-10-CM

## 2018-08-01 DIAGNOSIS — C50512 Malignant neoplasm of lower-outer quadrant of left female breast: Secondary | ICD-10-CM | POA: Diagnosis not present

## 2018-08-01 MED ORDER — RADIAPLEXRX EX GEL
Freq: Once | CUTANEOUS | Status: AC
  Administered 2018-08-01: 17:00:00 via TOPICAL

## 2018-08-01 MED ORDER — ALRA NON-METALLIC DEODORANT (RAD-ONC)
1.0000 "application " | Freq: Once | TOPICAL | Status: AC
  Administered 2018-08-01: 1 via TOPICAL

## 2018-08-01 NOTE — Progress Notes (Signed)
Pt here for patient teaching.  Pt given Radiation and You booklet, skin care instructions, Alra deodorant and Radiaplex gel.  Reviewed areas of pertinence such as fatigue, hair loss, skin changes, breast tenderness and breast swelling . Pt able to give teach back of to pat skin and use unscented/gentle soap,apply Radiaplex bid, avoid applying anything to skin within 4 hours of treatment, avoid wearing an under wire bra and to use an electric razor if they must shave. Pt verbalizes understanding of information given and will contact nursing with any questions or concerns.     Antwoine Zorn M. Nel Stoneking RN, BSN      

## 2018-08-03 ENCOUNTER — Ambulatory Visit
Admission: RE | Admit: 2018-08-03 | Discharge: 2018-08-03 | Disposition: A | Discharge status: Home / Self Care | Payer: 59 | Source: Ambulatory Visit | Attending: Radiation Oncology | Admitting: Radiation Oncology

## 2018-08-03 DIAGNOSIS — C50512 Malignant neoplasm of lower-outer quadrant of left female breast: Secondary | ICD-10-CM | POA: Diagnosis not present

## 2018-08-04 ENCOUNTER — Ambulatory Visit
Admission: RE | Admit: 2018-08-04 | Discharge: 2018-08-04 | Disposition: A | Discharge status: Home / Self Care | Payer: 59 | Source: Ambulatory Visit | Attending: Radiation Oncology | Admitting: Radiation Oncology

## 2018-08-04 DIAGNOSIS — C50512 Malignant neoplasm of lower-outer quadrant of left female breast: Secondary | ICD-10-CM | POA: Diagnosis not present

## 2018-08-05 ENCOUNTER — Ambulatory Visit
Admission: RE | Admit: 2018-08-05 | Discharge: 2018-08-05 | Disposition: A | Discharge status: Home / Self Care | Payer: 59 | Source: Ambulatory Visit | Attending: Radiation Oncology | Admitting: Radiation Oncology

## 2018-08-05 DIAGNOSIS — C50512 Malignant neoplasm of lower-outer quadrant of left female breast: Secondary | ICD-10-CM | POA: Diagnosis not present

## 2018-08-06 ENCOUNTER — Ambulatory Visit
Admission: RE | Admit: 2018-08-06 | Discharge: 2018-08-06 | Disposition: A | Discharge status: Home / Self Care | Payer: 59 | Source: Ambulatory Visit | Attending: Radiation Oncology | Admitting: Radiation Oncology

## 2018-08-06 DIAGNOSIS — C50512 Malignant neoplasm of lower-outer quadrant of left female breast: Secondary | ICD-10-CM | POA: Diagnosis not present

## 2018-08-11 ENCOUNTER — Ambulatory Visit
Admission: RE | Admit: 2018-08-11 | Discharge: 2018-08-11 | Disposition: A | Discharge status: Home / Self Care | Payer: 59 | Source: Ambulatory Visit | Attending: Radiation Oncology | Admitting: Radiation Oncology

## 2018-08-11 DIAGNOSIS — C50512 Malignant neoplasm of lower-outer quadrant of left female breast: Secondary | ICD-10-CM | POA: Diagnosis present

## 2018-08-11 DIAGNOSIS — Z170 Estrogen receptor positive status [ER+]: Secondary | ICD-10-CM | POA: Diagnosis not present

## 2018-08-11 DIAGNOSIS — Z510 Encounter for antineoplastic radiation therapy: Secondary | ICD-10-CM | POA: Insufficient documentation

## 2018-08-12 ENCOUNTER — Ambulatory Visit
Admission: RE | Admit: 2018-08-12 | Discharge: 2018-08-12 | Disposition: A | Discharge status: Home / Self Care | Payer: 59 | Source: Ambulatory Visit | Attending: Radiation Oncology | Admitting: Radiation Oncology

## 2018-08-12 DIAGNOSIS — E669 Obesity, unspecified: Secondary | ICD-10-CM | POA: Diagnosis not present

## 2018-08-12 DIAGNOSIS — C50512 Malignant neoplasm of lower-outer quadrant of left female breast: Secondary | ICD-10-CM | POA: Diagnosis not present

## 2018-08-12 DIAGNOSIS — R7303 Prediabetes: Secondary | ICD-10-CM | POA: Diagnosis not present

## 2018-08-12 DIAGNOSIS — I10 Essential (primary) hypertension: Secondary | ICD-10-CM | POA: Diagnosis not present

## 2018-08-13 ENCOUNTER — Ambulatory Visit
Admission: RE | Admit: 2018-08-13 | Discharge: 2018-08-13 | Disposition: A | Discharge status: Home / Self Care | Payer: 59 | Source: Ambulatory Visit | Attending: Radiation Oncology | Admitting: Radiation Oncology

## 2018-08-13 DIAGNOSIS — C50512 Malignant neoplasm of lower-outer quadrant of left female breast: Secondary | ICD-10-CM | POA: Diagnosis not present

## 2018-08-14 ENCOUNTER — Ambulatory Visit
Admission: RE | Admit: 2018-08-14 | Discharge: 2018-08-14 | Disposition: A | Discharge status: Home / Self Care | Payer: 59 | Source: Ambulatory Visit | Attending: Radiation Oncology | Admitting: Radiation Oncology

## 2018-08-14 DIAGNOSIS — C50512 Malignant neoplasm of lower-outer quadrant of left female breast: Secondary | ICD-10-CM | POA: Diagnosis not present

## 2018-08-15 ENCOUNTER — Ambulatory Visit
Admission: RE | Admit: 2018-08-15 | Discharge: 2018-08-15 | Disposition: A | Discharge status: Home / Self Care | Payer: 59 | Source: Ambulatory Visit | Attending: Radiation Oncology | Admitting: Radiation Oncology

## 2018-08-15 DIAGNOSIS — C50512 Malignant neoplasm of lower-outer quadrant of left female breast: Secondary | ICD-10-CM | POA: Diagnosis not present

## 2018-08-17 ENCOUNTER — Ambulatory Visit: Payer: 59

## 2018-08-18 ENCOUNTER — Encounter: Payer: Self-pay | Admitting: Family Medicine

## 2018-08-18 ENCOUNTER — Ambulatory Visit (INDEPENDENT_AMBULATORY_CARE_PROVIDER_SITE_OTHER): Payer: 59 | Admitting: Family Medicine

## 2018-08-18 ENCOUNTER — Ambulatory Visit
Admission: RE | Admit: 2018-08-18 | Discharge: 2018-08-18 | Disposition: A | Discharge status: Home / Self Care | Payer: 59 | Source: Ambulatory Visit | Attending: Radiation Oncology | Admitting: Radiation Oncology

## 2018-08-18 VITALS — BP 154/90 | HR 66 | Temp 98.1°F | Ht 66.5 in | Wt 191.5 lb

## 2018-08-18 DIAGNOSIS — C50512 Malignant neoplasm of lower-outer quadrant of left female breast: Secondary | ICD-10-CM

## 2018-08-18 DIAGNOSIS — E669 Obesity, unspecified: Secondary | ICD-10-CM | POA: Diagnosis not present

## 2018-08-18 DIAGNOSIS — Z170 Estrogen receptor positive status [ER+]: Secondary | ICD-10-CM

## 2018-08-18 DIAGNOSIS — I10 Essential (primary) hypertension: Secondary | ICD-10-CM

## 2018-08-18 DIAGNOSIS — R7303 Prediabetes: Secondary | ICD-10-CM | POA: Diagnosis not present

## 2018-08-18 MED ORDER — LOSARTAN POTASSIUM 50 MG PO TABS: 50.0000 mg | ORAL_TABLET | Freq: Every day | ORAL | 11 refills | Status: DC

## 2018-08-18 MED ORDER — TRIAMTERENE-HCTZ 75-50 MG PO TABS: 0.5000 | ORAL_TABLET | Freq: Every day | ORAL | 11 refills | Status: DC

## 2018-08-18 NOTE — Progress Notes (Signed)
Subjective:    Patient ID: Lindsey Wright, female    DOB: [birth year], 52 y.o.   MRN: [id number]  HPI Here for f/u of chronic medical problems   Wt Readings from Last 3 Encounters:  08/18/18 191 lb 8 oz (86.9 kg)  07/31/18 193 lb 3.2 oz (87.6 kg)  07/29/18 194 lb (88 kg)  trying to loose weight (gained with her chemo and steroids)  Gained to 207 lb  Is coming down now  30.45 kg/m   Diet - is eating less fats/carbs (sweets)  Exercise - regular walking program   Had breast lumpectomy in oct for breast cancer  Having radiation tx now (3rd week- will c/o 33 tx)- will finish in January  Had chemo  Had some post sx pain-that is improved  Gets checked every Friday   She is a bit tired (chemo was worse)   bp is up  No cp or palpitations or headaches or edema  No side effects to medicines    BP Readings from Last 3 Encounters:  08/18/18 (!) 154/90  07/31/18 (!) 170/90  07/29/18 (!) 152/87    Takes losartan 25 mg daily  Needs more tx Was formerly on triamterine - it was stopped due to low bp in June with syncope  Was on 75-50  That is better now   Prediabetes  Lab Results  Component Value Date   HGBA1C 6.0 11/11/2017   Lab Results  Component Value Date   CHOL 121 11/11/2017   HDL 44.20 11/11/2017   LDLCALC 60 11/11/2017   TRIG 82.0 11/11/2017   CHOLHDL 3 11/11/2017   Lab Results  Component Value Date   CREATININE 0.84 07/29/2018   BUN 11 07/29/2018   NA 141 07/29/2018   K 3.5 07/29/2018   CL 104 07/29/2018   CO2 28 07/29/2018   Patient Active Problem List   Diagnosis Date Noted  . Syncope 02/15/2018  . Port-A-Cath in place 01/20/2018  . Malignant neoplasm of lower-outer quadrant of 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, estrogen receptor positive ([place]) 12/26/2017  . Colon cancer screening 01/29/2017  . Obesity (BMI 30-39.9) 10/29/2016  . Prediabetes 09/25/2013  . Essential hypertension 08/15/2007   Past Medical History:  Diagnosis Date  . Anemia    iron  deficient anemia  . Breast cancer ([place])   . History of Bell's palsy     x 2 as teenager and in 20's  . Hypertension   . Menorrhagia   . Overweight(278.02)   . Peripheral edema    Past Surgical History:  Procedure Laterality Date  . BREAST BIOPSY Right 02/01/2015   benign  . BREAST LUMPECTOMY WITH RADIOACTIVE SEED AND SENTINEL LYMPH NODE BIOPSY Left 06/16/2018   Procedure: LEFT BREAST LUMPECTOMY WITH RADIOACTIVE SEED X'S 2 WITH SEED TARGETED LYMPH NODE EXCISION AND SENTINEL LYMPH NODE BIOPSY;  Surgeon: [name], MD;  Location: [facility];  Service: General;  Laterality: Left;  . CESAREAN SECTION CLASSICAL    . CHOLECYSTECTOMY    . COSMETIC SURGERY    . PORTACATH PLACEMENT Right 01/17/2018   Procedure: INSERTION PORT-A-CATH;  Surgeon: [name], MD;  Location: [facility];  Service: General;  Laterality: Right;  . TUBAL LIGATION     Social History   Tobacco Use  . Smoking status: Never Smoker  . Smokeless tobacco: Never Used  Substance Use Topics  . Alcohol use: No    Alcohol/week: 0.0 standard drinks  . Drug use: No  Family History  Problem Relation Age of Onset  . Hypertension Mother   . Diabetes Mother   . Stroke Mother   . Alcohol abuse Father   . Cancer Father        lung ca  . Hypertension Father   . Hypertension Sister   . Colon cancer Neg Hx   . Colon polyps Neg Hx   . Rectal cancer Neg Hx   . Stomach cancer Neg Hx    Allergies  Allergen Reactions  . Ace Inhibitors Cough   Current Outpatient Medications on File Prior to Visit  Medication Sig Dispense Refill  . cholecalciferol (VITAMIN D) 1000 units tablet Take 1 tablet (1,000 Units total) by mouth daily. 100 tablet 4  . potassium chloride SA (KLOR-CON M20) 20 MEQ tablet Take 2 tablets (40 mEq total) by mouth daily. 60 tablet 11  . [DISCONTINUED] prochlorperazine (COMPAZINE) 10 MG tablet Take 1 tablet (10 mg total) by mouth every 6 (six) hours as needed (Nausea or  vomiting). 30 tablet 1   No current facility-administered medications on file prior to visit.      Review of Systems  Constitutional: Negative for activity change, appetite change, fatigue, fever and unexpected weight change.  HENT: Negative for congestion, ear pain, rhinorrhea, sinus pressure and sore throat.   Eyes: Negative for pain, redness and visual disturbance.  Respiratory: Negative for cough, shortness of breath and wheezing.   Cardiovascular: Negative for chest pain and palpitations.  Gastrointestinal: Negative for abdominal pain, blood in stool, constipation and diarrhea.  Endocrine: Negative for polydipsia and polyuria.  Genitourinary: Negative for dysuria, frequency and urgency.  Musculoskeletal: Negative for arthralgias, back pain and myalgias.  Skin: Negative for pallor and rash.  Allergic/Immunologic: Negative for environmental allergies.  Neurological: Negative for dizziness, syncope and headaches.  Hematological: Negative for adenopathy. Does not bruise/bleed easily.  Psychiatric/Behavioral: Negative for decreased concentration and dysphoric mood. The patient is not nervous/anxious.        Objective:   Physical Exam  Constitutional: She appears well-developed and well-nourished. No distress.  HENT:  Head: Normocephalic and atraumatic.  Right Ear: External ear normal.  Left Ear: External ear normal.  Nose: Nose normal.  Mouth/Throat: Oropharynx is clear and moist.  Eyes: Pupils are equal, round, and reactive to light. Conjunctivae and EOM are normal. Right eye exhibits no discharge. Left eye exhibits no discharge. No scleral icterus.  Neck: Normal range of motion. Neck supple. No JVD present. Carotid bruit is not present. No thyromegaly present.  Cardiovascular: Normal rate, regular rhythm, normal heart sounds and intact distal pulses. Exam reveals no gallop.  Pulmonary/Chest: Effort normal and breath sounds normal. No respiratory distress. She has no wheezes. She  has no rales.  Abdominal: Soft. Bowel sounds are normal. She exhibits no distension and no mass. There is no tenderness.  Musculoskeletal: She exhibits no edema or tenderness.  Lymphadenopathy:    She has no cervical adenopathy.  Neurological: She is alert. She has normal reflexes. She displays normal reflexes. No cranial nerve deficit. She exhibits normal muscle tone. Coordination normal.  Skin: Skin is warm and dry. No rash noted. No erythema. No pallor.  Port a cath in place   Psychiatric: She has a normal mood and affect.  Pleasant           Assessment & Plan:   Problem List Items Addressed This Visit      Cardiovascular and Mediastinum   Essential hypertension - Primary    BP is up -  presumably since changes made in medication when in chemotx and hypotensive /dehydrated Recovered now BP: (!) 154/90    Will add back maxzide 75-50 a half pill daily (continue K) Also inc losartan back to 50 mg daily  F/u approx 2 wk for visit and lab Handout on DASH eating  Enc healthy habits       Relevant Medications   triamterene-hydrochlorothiazide (MAXZIDE) 75-50 MG tablet   losartan (COZAAR) 50 MG tablet     Other   Prediabetes    Lab Results  Component Value Date   HGBA1C 6.0 11/11/2017   Enc low glycemic diet  Was on prednisone with her chemotx - now off of it and loosing wt       Obesity (BMI 30-39.9)    Discussed how this problem influences overall health and the risks it imposes  Reviewed plan for weight loss with lower calorie diet (via better food choices and also portion control or program like weight watchers) and exercise building up to or more than 30 minutes 5 days per week including some aerobic activity         Malignant neoplasm of lower-outer quadrant of 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, estrogen receptor positive ([place])    Doing well s/p lumpectomy and chemotx  Now with radiation tx  Tolerating these In good spirits

## 2018-08-18 NOTE — Assessment & Plan Note (Signed)
Discussed how this problem influences overall health and the risks it imposes  Reviewed plan for weight loss with lower calorie diet (via better food choices and also portion control or program like weight watchers) and exercise building up to or more than 30 minutes 5 days per week including some aerobic activity    

## 2018-08-18 NOTE — Assessment & Plan Note (Signed)
Lab Results  Component Value Date   HGBA1C 6.0 11/11/2017   Enc low glycemic diet  Was on prednisone with her chemotx - now off of it and loosing wt

## 2018-08-18 NOTE — Patient Instructions (Addendum)
Go back to a whole pill of losartan daily  Add the 1/2 triamterine-hct  Continue potassium   Follow up in about 2 weeks   Check bp at home if you can  Avoid excess sodium   If any problems or side effects or low blood pressure-please let me know

## 2018-08-18 NOTE — Assessment & Plan Note (Signed)
Doing well s/p lumpectomy and chemotx  Now with radiation tx  Tolerating these In good spirits

## 2018-08-18 NOTE — Assessment & Plan Note (Signed)
BP is up -presumably since changes made in medication when in chemotx and hypotensive /dehydrated Recovered now BP: (!) 154/90    Will add back maxzide 75-50 a half pill daily (continue K) Also inc losartan back to 50 mg daily  F/u approx 2 wk for visit and lab Handout on DASH eating  Enc healthy habits

## 2018-08-19 ENCOUNTER — Ambulatory Visit
Admission: RE | Admit: 2018-08-19 | Discharge: 2018-08-19 | Disposition: A | Discharge status: Home / Self Care | Payer: 59 | Source: Ambulatory Visit | Attending: Radiation Oncology | Admitting: Radiation Oncology

## 2018-08-19 DIAGNOSIS — C50512 Malignant neoplasm of lower-outer quadrant of left female breast: Secondary | ICD-10-CM | POA: Diagnosis not present

## 2018-08-20 ENCOUNTER — Ambulatory Visit
Admission: RE | Admit: 2018-08-20 | Discharge: 2018-08-20 | Disposition: A | Discharge status: Home / Self Care | Payer: 59 | Source: Ambulatory Visit | Attending: Radiation Oncology | Admitting: Radiation Oncology

## 2018-08-20 DIAGNOSIS — C50512 Malignant neoplasm of lower-outer quadrant of left female breast: Secondary | ICD-10-CM | POA: Diagnosis not present

## 2018-08-21 ENCOUNTER — Ambulatory Visit
Admission: RE | Admit: 2018-08-21 | Discharge: 2018-08-21 | Disposition: A | Discharge status: Home / Self Care | Payer: 59 | Source: Ambulatory Visit | Attending: Radiation Oncology | Admitting: Radiation Oncology

## 2018-08-21 DIAGNOSIS — C50512 Malignant neoplasm of lower-outer quadrant of left female breast: Secondary | ICD-10-CM | POA: Diagnosis not present

## 2018-08-22 ENCOUNTER — Ambulatory Visit
Admission: RE | Admit: 2018-08-22 | Discharge: 2018-08-22 | Disposition: A | Discharge status: Home / Self Care | Payer: 59 | Source: Ambulatory Visit | Attending: Radiation Oncology | Admitting: Radiation Oncology

## 2018-08-22 DIAGNOSIS — C50512 Malignant neoplasm of lower-outer quadrant of left female breast: Secondary | ICD-10-CM | POA: Diagnosis not present

## 2018-08-24 ENCOUNTER — Other Ambulatory Visit: Payer: Self-pay | Admitting: Medical

## 2018-08-24 DIAGNOSIS — R601 Generalized edema: Secondary | ICD-10-CM

## 2018-08-25 ENCOUNTER — Ambulatory Visit
Admission: RE | Admit: 2018-08-25 | Discharge: 2018-08-25 | Disposition: A | Discharge status: Home / Self Care | Payer: 59 | Source: Ambulatory Visit | Attending: Radiation Oncology | Admitting: Radiation Oncology

## 2018-08-25 DIAGNOSIS — C50512 Malignant neoplasm of lower-outer quadrant of left female breast: Secondary | ICD-10-CM | POA: Diagnosis not present

## 2018-08-26 ENCOUNTER — Ambulatory Visit: Payer: 59

## 2018-08-26 ENCOUNTER — Ambulatory Visit
Admission: RE | Admit: 2018-08-26 | Discharge: 2018-08-26 | Disposition: A | Discharge status: Home / Self Care | Payer: 59 | Source: Ambulatory Visit | Attending: Radiation Oncology | Admitting: Radiation Oncology

## 2018-08-26 DIAGNOSIS — C50512 Malignant neoplasm of lower-outer quadrant of left female breast: Secondary | ICD-10-CM | POA: Diagnosis not present

## 2018-08-27 ENCOUNTER — Ambulatory Visit
Admission: RE | Admit: 2018-08-27 | Discharge: 2018-08-27 | Disposition: A | Discharge status: Home / Self Care | Payer: 59 | Source: Ambulatory Visit | Attending: Radiation Oncology | Admitting: Radiation Oncology

## 2018-08-27 DIAGNOSIS — C50512 Malignant neoplasm of lower-outer quadrant of left female breast: Secondary | ICD-10-CM | POA: Diagnosis not present

## 2018-08-28 ENCOUNTER — Ambulatory Visit
Admission: RE | Admit: 2018-08-28 | Discharge: 2018-08-28 | Disposition: A | Discharge status: Home / Self Care | Payer: 59 | Source: Ambulatory Visit | Attending: Radiation Oncology | Admitting: Radiation Oncology

## 2018-08-28 DIAGNOSIS — C50512 Malignant neoplasm of lower-outer quadrant of left female breast: Secondary | ICD-10-CM | POA: Diagnosis not present

## 2018-08-29 ENCOUNTER — Ambulatory Visit
Admission: RE | Admit: 2018-08-29 | Discharge: 2018-08-29 | Disposition: A | Discharge status: Home / Self Care | Payer: 59 | Source: Ambulatory Visit | Attending: Radiation Oncology | Admitting: Radiation Oncology

## 2018-08-29 DIAGNOSIS — C50512 Malignant neoplasm of lower-outer quadrant of left female breast: Secondary | ICD-10-CM | POA: Diagnosis not present

## 2018-09-01 ENCOUNTER — Telehealth: Payer: Self-pay | Admitting: *Deleted

## 2018-09-01 ENCOUNTER — Encounter: Payer: Self-pay | Admitting: Family Medicine

## 2018-09-01 ENCOUNTER — Ambulatory Visit
Admission: RE | Admit: 2018-09-01 | Discharge: 2018-09-01 | Disposition: A | Discharge status: Home / Self Care | Payer: 59 | Source: Ambulatory Visit | Attending: Radiation Oncology | Admitting: Radiation Oncology

## 2018-09-01 ENCOUNTER — Ambulatory Visit (INDEPENDENT_AMBULATORY_CARE_PROVIDER_SITE_OTHER): Payer: 59 | Admitting: Family Medicine

## 2018-09-01 VITALS — BP 135/80 | HR 72 | Temp 98.3°F | Ht 66.5 in | Wt 189.0 lb

## 2018-09-01 DIAGNOSIS — I10 Essential (primary) hypertension: Secondary | ICD-10-CM

## 2018-09-01 DIAGNOSIS — C50512 Malignant neoplasm of lower-outer quadrant of left female breast: Secondary | ICD-10-CM | POA: Diagnosis not present

## 2018-09-01 LAB — RENAL FUNCTION PANEL
ALBUMIN: 4 g/dL (ref 3.5–5.2)
BUN: 14 mg/dL (ref 6–23)
CO2: 29 mEq/L (ref 19–32)
Calcium: 9.6 mg/dL (ref 8.4–10.5)
Chloride: 101 mEq/L (ref 96–112)
Creatinine, Ser: 0.84 mg/dL (ref 0.40–1.20)
GFR: 91.56 mL/min (ref >60.00)
GLUCOSE: 232 mg/dL — AB (ref 70–99)
Phosphorus: 2.6 mg/dL (ref 2.3–4.6)
Potassium: 3.3 mEq/L — ABNORMAL LOW (ref 3.5–5.1)
SODIUM: 138 meq/L (ref 135–145)

## 2018-09-01 NOTE — Assessment & Plan Note (Signed)
bp in fair control at this time  Improved with inc of losartan to 50 and re start of maxzide No problems  BP Readings from Last 1 Encounters:  09/01/18 135/80  lab for renal panel today  No changes needed Most recent labs reviewed  Disc lifstyle change with low sodium diet and exercise

## 2018-09-01 NOTE — Telephone Encounter (Signed)
Let VM with family requesting pt to call the office back regarding lab results

## 2018-09-01 NOTE — Progress Notes (Signed)
Subjective:    Patient ID: Lindsey Wright, female    DOB: 1965-12-01, 52 y.o.   MRN: [id number]  HPI  Here for f/u of HTN/ chronic health problems  Feels ok   Wt Readings from Last 3 Encounters:  09/01/18 189 lb (85.7 kg)  08/18/18 191 lb 8 oz (86.9 kg)  07/31/18 193 lb 3.2 oz (87.6 kg)  wt is down 2 lb  Really watching her diet  Walking for exercise (at work)  30.05 kg/m    BP was elevated last time (medication had been reduced by oncology) We added back maxzide 75-50 mg daily and inc losartan to 50  No side effects   BP Readings from Last 3 Encounters:  09/01/18 (!) 142/78  08/18/18 (!) 154/90  07/31/18 (!) 170/90   Lab Results  Component Value Date   CREATININE 0.84 07/29/2018   BUN 11 07/29/2018   NA 141 07/29/2018   K 3.5 07/29/2018   CL 104 07/29/2018   CO2 28 07/29/2018   Getting her radiation tx for breast cancer  Some skin issues Otherwise doing very well  A little tired   Patient Active Problem List   Diagnosis Date Noted  . Port-A-Cath in place 01/20/2018  . Malignant neoplasm of lower-outer quadrant of 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, estrogen receptor positive ([place]) 12/26/2017  . Colon cancer screening 01/29/2017  . Obesity (BMI 30-39.9) 10/29/2016  . Prediabetes 09/25/2013  . Essential hypertension 08/15/2007   Past Medical History:  Diagnosis Date  . Anemia    iron deficient anemia  . Breast cancer ([place])   . History of Bell's palsy     x 2 as teenager and in 20's  . Hypertension   . Menorrhagia   . Overweight(278.02)   . Peripheral edema    Past Surgical History:  Procedure Laterality Date  . BREAST BIOPSY Right 02/01/2015   benign  . BREAST LUMPECTOMY WITH RADIOACTIVE SEED AND SENTINEL LYMPH NODE BIOPSY Left 06/16/2018   Procedure: LEFT BREAST LUMPECTOMY WITH RADIOACTIVE SEED X'S 2 WITH SEED TARGETED LYMPH NODE EXCISION AND SENTINEL LYMPH NODE BIOPSY;  Surgeon: [name], MD;  Location: [facility];  Service:  General;  Laterality: Left;  . CESAREAN SECTION CLASSICAL    . CHOLECYSTECTOMY    . COSMETIC SURGERY    . PORTACATH PLACEMENT Right 01/17/2018   Procedure: INSERTION PORT-A-CATH;  Surgeon: [name], MD;  Location: [facility];  Service: General;  Laterality: Right;  . TUBAL LIGATION     Social History   Tobacco Use  . Smoking status: Never Smoker  . Smokeless tobacco: Never Used  Substance Use Topics  . Alcohol use: No    Alcohol/week: 0.0 standard drinks  . Drug use: No   Family History  Problem Relation Age of Onset  . Hypertension Mother   . Diabetes Mother   . Stroke Mother   . Alcohol abuse Father   . Cancer Father        lung ca  . Hypertension Father   . Hypertension Sister   . Colon cancer Neg Hx   . Colon polyps Neg Hx   . Rectal cancer Neg Hx   . Stomach cancer Neg Hx    Allergies  Allergen Reactions  . Ace Inhibitors Cough   Current Outpatient Medications on File Prior to Visit  Medication Sig Dispense Refill  . cholecalciferol (VITAMIN D) 1000 units tablet Take 1 tablet (1,000 Units total) by mouth daily. 100  tablet 4  . losartan (COZAAR) 50 MG tablet Take 1 tablet (50 mg total) by mouth daily. 30 tablet 11  . potassium chloride SA (KLOR-CON M20) 20 MEQ tablet Take 2 tablets (40 mEq total) by mouth daily. 60 tablet 11  . triamterene-hydrochlorothiazide (MAXZIDE) 75-50 MG tablet Take 0.5 tablets by mouth daily. 15 tablet 11  . [DISCONTINUED] prochlorperazine (COMPAZINE) 10 MG tablet Take 1 tablet (10 mg total) by mouth every 6 (six) hours as needed (Nausea or vomiting). 30 tablet 1   No current facility-administered medications on file prior to visit.     Review of Systems  Constitutional: Negative for activity change, appetite change, fatigue, fever and unexpected weight change.       Fatigue (from cancer treatment)  HENT: Negative for congestion, ear pain, rhinorrhea, sinus pressure and sore throat.   Eyes: Negative for pain, redness  and visual disturbance.  Respiratory: Negative for cough, shortness of breath and wheezing.   Cardiovascular: Negative for chest pain and palpitations.  Gastrointestinal: Negative for abdominal pain, blood in stool, constipation and diarrhea.  Endocrine: Negative for polydipsia and polyuria.  Genitourinary: Negative for dysuria, frequency and urgency.  Musculoskeletal: Negative for arthralgias, back pain and myalgias.  Skin: Negative for pallor and rash.       Some skin changes on breast from radiation tx  Allergic/Immunologic: Negative for environmental allergies.  Neurological: Negative for dizziness, syncope and headaches.  Hematological: Negative for adenopathy. Does not bruise/bleed easily.  Psychiatric/Behavioral: Negative for decreased concentration and dysphoric mood. The patient is not nervous/anxious.        Objective:   Physical Exam Constitutional:      General: She is not in acute distress.    Appearance: She is well-developed. She is obese. She is not ill-appearing.  HENT:     Head: Normocephalic and atraumatic.  Eyes:     Conjunctiva/sclera: Conjunctivae normal.     Pupils: Pupils are equal, round, and reactive to light.  Neck:     Musculoskeletal: Normal range of motion and neck supple.     Thyroid: No thyromegaly.     Vascular: No carotid bruit or JVD.  Cardiovascular:     Rate and Rhythm: Normal rate and regular rhythm.     Heart sounds: Normal heart sounds. No gallop.   Pulmonary:     Effort: Pulmonary effort is normal. No respiratory distress.     Breath sounds: Normal breath sounds. No wheezing or rales.  Abdominal:     General: There is no abdominal bruit.  Lymphadenopathy:     Cervical: No cervical adenopathy.  Skin:    General: Skin is warm and dry.     Findings: No rash.  Neurological:     General: No focal deficit present.     Mental Status: She is alert.     Deep Tendon Reflexes: Reflexes are normal and symmetric.  Psychiatric:        Mood  and Affect: Mood normal.           Assessment & Plan:   Problem List Items Addressed This Visit      Cardiovascular and Mediastinum   Essential hypertension - Primary    bp in fair control at this time  Improved with inc of losartan to 50 and re start of maxzide No problems  BP Readings from Last 1 Encounters:  09/01/18 135/80  lab for renal panel today  No changes needed Most recent labs reviewed  Disc lifstyle change with low sodium diet  and exercise         Relevant Orders   Renal function panel

## 2018-09-01 NOTE — Patient Instructions (Signed)
Blood pressure is improved today  Stay on current medicines    Keep working on healthy habits Labs today

## 2018-09-02 ENCOUNTER — Ambulatory Visit
Admission: RE | Admit: 2018-09-02 | Discharge: 2018-09-02 | Disposition: A | Discharge status: Home / Self Care | Payer: 59 | Source: Ambulatory Visit | Attending: Radiation Oncology | Admitting: Radiation Oncology

## 2018-09-02 DIAGNOSIS — C50512 Malignant neoplasm of lower-outer quadrant of left female breast: Secondary | ICD-10-CM | POA: Diagnosis not present

## 2018-09-04 ENCOUNTER — Ambulatory Visit
Admission: RE | Admit: 2018-09-04 | Discharge: 2018-09-04 | Disposition: A | Discharge status: Home / Self Care | Payer: 59 | Source: Ambulatory Visit | Attending: Radiation Oncology | Admitting: Radiation Oncology

## 2018-09-04 DIAGNOSIS — C50512 Malignant neoplasm of lower-outer quadrant of left female breast: Secondary | ICD-10-CM | POA: Diagnosis not present

## 2018-09-05 ENCOUNTER — Ambulatory Visit: Payer: 59 | Admitting: Radiation Oncology

## 2018-09-05 ENCOUNTER — Ambulatory Visit
Admission: RE | Admit: 2018-09-05 | Discharge: 2018-09-05 | Disposition: A | Discharge status: Home / Self Care | Payer: 59 | Source: Ambulatory Visit | Attending: Radiation Oncology | Admitting: Radiation Oncology

## 2018-09-05 DIAGNOSIS — C50512 Malignant neoplasm of lower-outer quadrant of left female breast: Secondary | ICD-10-CM | POA: Diagnosis not present

## 2018-09-05 NOTE — Telephone Encounter (Signed)
Addressed in lab results

## 2018-09-08 ENCOUNTER — Ambulatory Visit
Admission: RE | Admit: 2018-09-08 | Discharge: 2018-09-08 | Disposition: A | Discharge status: Home / Self Care | Payer: 59 | Source: Ambulatory Visit | Attending: Radiation Oncology | Admitting: Radiation Oncology

## 2018-09-08 ENCOUNTER — Ambulatory Visit: Payer: 59

## 2018-09-08 DIAGNOSIS — C50512 Malignant neoplasm of lower-outer quadrant of left female breast: Secondary | ICD-10-CM | POA: Diagnosis not present

## 2018-09-09 ENCOUNTER — Ambulatory Visit: Payer: 59

## 2018-09-09 DIAGNOSIS — C50512 Malignant neoplasm of lower-outer quadrant of left female breast: Secondary | ICD-10-CM | POA: Diagnosis not present

## 2018-09-11 ENCOUNTER — Ambulatory Visit
Admission: RE | Admit: 2018-09-11 | Discharge: 2018-09-11 | Disposition: A | Discharge status: Home / Self Care | Payer: 59 | Source: Ambulatory Visit | Attending: Radiation Oncology | Admitting: Radiation Oncology

## 2018-09-11 DIAGNOSIS — Z170 Estrogen receptor positive status [ER+]: Secondary | ICD-10-CM | POA: Diagnosis not present

## 2018-09-11 DIAGNOSIS — C50512 Malignant neoplasm of lower-outer quadrant of left female breast: Secondary | ICD-10-CM | POA: Insufficient documentation

## 2018-09-11 DIAGNOSIS — Z510 Encounter for antineoplastic radiation therapy: Secondary | ICD-10-CM | POA: Diagnosis not present

## 2018-09-12 ENCOUNTER — Ambulatory Visit
Admission: RE | Admit: 2018-09-12 | Discharge: 2018-09-12 | Disposition: A | Discharge status: Home / Self Care | Payer: 59 | Source: Ambulatory Visit | Attending: Radiation Oncology | Admitting: Radiation Oncology

## 2018-09-12 DIAGNOSIS — C50512 Malignant neoplasm of lower-outer quadrant of left female breast: Secondary | ICD-10-CM | POA: Diagnosis not present

## 2018-09-15 ENCOUNTER — Ambulatory Visit: Payer: 59

## 2018-09-15 ENCOUNTER — Ambulatory Visit
Admission: RE | Admit: 2018-09-15 | Discharge: 2018-09-15 | Disposition: A | Discharge status: Home / Self Care | Payer: 59 | Source: Ambulatory Visit | Attending: Radiation Oncology | Admitting: Radiation Oncology

## 2018-09-15 ENCOUNTER — Encounter: Payer: Self-pay | Admitting: Radiation Oncology

## 2018-09-15 DIAGNOSIS — C50512 Malignant neoplasm of lower-outer quadrant of left female breast: Secondary | ICD-10-CM | POA: Diagnosis not present

## 2018-09-16 ENCOUNTER — Ambulatory Visit: Payer: 59

## 2018-09-25 NOTE — Progress Notes (Signed)
  Radiation Oncology         [phone] ________________________________  Name: Lindsey Wright MRN: [id number]  Date: 09/15/2018  DOB: 1966-06-10  End of Treatment Note  Diagnosis:   53 y.o. 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 This was delivered using a 3-D conformal technique. The patient then received a boost to the seroma. This delivered an additional 10 Gy in 5 fractions using 6X, 10X photons with a Complex Isodose technique. The total dose was 60.4 Gy.  Narrative: The patient tolerated radiation treatment relatively well.   The patient had some expected skin irritation with hyperpigmentation and dry desquamation as she progressed during treatment. Minimal moist desquamation was present in the IM fold at the end of treatment. She is using Neosporin to the peeling areas and Radiaplex everywhere else. She also noted increased fatigue.  Plan: The patient has completed radiation treatment. The patient will return to radiation oncology clinic for routine followup in one month. I advised the patient to call or return sooner if they have any questions or concerns related to their recovery or treatment. ________________________________  Jodelle Gross, MD, PhD  This document serves as a record of services personally performed by Kyung Rudd, MD. It was created on his behalf by Rae Lips, a trained medical scribe. The creation of this record is based on the scribe's personal observations and the provider's statements to them. This document has been checked and approved by  the attending provider.

## 2018-10-06 ENCOUNTER — Other Ambulatory Visit: Payer: Self-pay | Admitting: *Deleted

## 2018-10-06 DIAGNOSIS — C50512 Malignant neoplasm of lower-outer quadrant of left female breast: Secondary | ICD-10-CM

## 2018-10-06 DIAGNOSIS — Z170 Estrogen receptor positive status [ER+]: Principal | ICD-10-CM

## 2018-10-06 NOTE — Progress Notes (Signed)
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   She completed her neoadjuvant radiation which took place from 07/28/2018 to 09/15/2018  Site/dose: The patient initially received a dose of 50.4 Gy in 28 fractions to the left breast using whole-breast tangent fields. This was delivered using a 3-D conformal technique. The patient then received a boost to the seroma. This delivered an additional 10 Gy in 5 fractions using 6X, 10X photons with a Complex Isodose technique. The total dose was 60.4 Gy.  Since her last visit here she underwent an echocardiogram on 07/31/2018, showing an ejection fraction in the 55% - 60% range.     REVIEW OF SYSTEMS: Lindsey Wright said she has some sharp and brief pain in her breast; it happened on the fist day of radiation and then subsided, but now since the end of radiation it is more frequent. She peeled during radiation. She says she doesn't feel fatigued, but that she feels "sleepy" every now and then. For exercise, she walks twice a day for 15 minutes 5 times a week. 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 The patient denies unusual headaches, visual changes, nausea, vomiting, or dizziness. There has been no unusual cough, phlegm production, or pleurisy. This been no change in bowel or bladder habits. The  patient denies unexplained fatigue or unexplained weight loss, bleeding, rash, or fever. A detailed review of systems was otherwise noncontributory.    HISTORY OF CURRENT ILLNESS: From the original intake note:  "Lindsey Wright" palpated a mass in the left breast about 2 weeks ago. She followed up with her gynecologist, Dr. Jean Rosenthal who referred her to the [facility] for mammography. She underwent unilateral left diagnostic mammography with tomography and left breast ultrasonography at [facility] on 12/20/2017 showing: breast density category B. Suspicious newly palpable left breast mass at the 5 o'clock lower outer position with internal blow flow measuring 2.3 x 1.8 x 2.5 cm. There are either 2 adjacent masses or a single complicated mass at the 3:41 lower outer position, 4 cm from the nipple measuring 1.8 x 0.5 by 0.9 cm which may represent fibrocystic changes versus a solid mass. No other suspicious findings. Ultrasound showed normal axillary nodes.  Accordingly on 12/23/2017 she proceeded to biopsy of the left breast areas in question. The pathology from this procedure showed ([id number]): At both the 5 o'clock spanning 1.2 cm and 3:30 position spanning 0.7 cm, invasive ductal carcinoma, grade III. Prognostic indicators significant for: estrogen receptor, 10% positive with weak staining intensity and progesterone receptor, 0% negative. Proliferation marker Ki67 at 90%. HER2 amplified with ratios ER2/CEP17 signals 2.33 and average HER2 copies per cell 4.65  The patient's subsequent history is as detailed below.   PAST MEDICAL HISTORY: Past Medical History:  Diagnosis Date  . Anemia    iron deficient anemia  . Breast cancer ([place])   . History of Bell's palsy  x 2 as teenager and in 20's  . Hypertension   . Menorrhagia   . Overweight(278.02)   . Peripheral edema     PAST SURGICAL HISTORY: Past Surgical History:  Procedure Laterality Date  . BREAST BIOPSY Right 02/01/2015   benign    . BREAST LUMPECTOMY WITH RADIOACTIVE SEED AND SENTINEL LYMPH NODE BIOPSY Left 06/16/2018   Procedure: LEFT BREAST LUMPECTOMY WITH RADIOACTIVE SEED X'S 2 WITH SEED TARGETED LYMPH NODE EXCISION AND SENTINEL LYMPH NODE BIOPSY;  Surgeon: [name], MD;  Location: [facility] [place];  Service: General;  Laterality: Left;  . CESAREAN SECTION CLASSICAL    . CHOLECYSTECTOMY    . COSMETIC SURGERY    . PORTACATH PLACEMENT Right 01/17/2018   Procedure: INSERTION PORT-A-CATH;  Surgeon: [name], MD;  Location: [place];  Service: General;  Laterality: Right;  . TUBAL LIGATION      FAMILY HISTORY Family History  Problem Relation Age of Onset  . Hypertension Mother   . Diabetes Mother   . Stroke Mother   . Alcohol abuse Father   . Cancer Father        lung ca  . Hypertension Father   . Hypertension Sister   . Colon cancer Neg Hx   . Colon polyps Neg Hx   . Rectal cancer Neg Hx   . Stomach cancer Neg Hx    The patient's father was diagnosed with lung cancer at age 18 and died the same year. The patient's mother died at age 48 due to several strokes. The patient had 1 brother who died due to strokes and possibly a MI. The patient has 1 sister. She denies a history of breast or ovarian cancer in the family.    GYNECOLOGIC HISTORY:  No LMP recorded. (Menstrual status: Perimenopausal). Menarche: 53 years old Age at first live birth: 53 years old The patient is GXP2. The patient is not having periods, with her LMP being in 2010. She used oral contraceptive for about 10 years with no complications. She never used HRT.    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 Her husband, [name], works part time in Therapist, art.  The patient's daughter [name] age 92, is a Ship broker and starting a job. The patient's daughter [name] age 4, is a Ship broker.  Both of the patient's daughters live with her.      ADVANCED DIRECTIVES:    HEALTH  MAINTENANCE: Social History   Tobacco Use  . Smoking status: Never Smoker  . Smokeless tobacco: Never Used  Substance Use Topics  . Alcohol use: No    Alcohol/week: 0.0 standard drinks  . Drug use: No     Colonoscopy: 03/27/2017 polyp removal/ Dr. Deeann Saint  PAP: May 2017 normal  Bone density:   Allergies  Allergen Reactions  . Ace Inhibitors Cough    Current Outpatient Medications  Medication Sig Dispense Refill  . cholecalciferol (VITAMIN D) 1000 units tablet Take 1 tablet (1,000 Units total) by mouth daily. 100 tablet 4  . losartan (COZAAR) 50 MG tablet Take 1 tablet (50 mg total) by mouth daily. 30 tablet 11  . potassium chloride SA (KLOR-CON M20) 20 MEQ tablet Take 2 tablets (40 mEq total) by mouth daily. 60 tablet 11  . triamterene-hydrochlorothiazide (MAXZIDE) 75-50 MG tablet Take 0.5 tablets by mouth daily. 15 tablet 11   No current facility-administered medications for this visit.     OBJECTIVE: Middle-aged African-American woman in no acute  distress  Vitals:   10/07/18 0931  BP: 136/80  Pulse: 70  Resp: 18  Temp: 97.8 F (36.6 C)  SpO2: 98%     Body mass index is 30.32 kg/m.   Wt Readings from Last 3 Encounters:  10/07/18 190 lb 11.2 oz (86.5 kg)  09/01/18 189 lb (85.7 kg)  08/18/18 191 lb 8 oz (86.9 kg)  ECOG FS:1  Sclerae unicteric, EOMs intact No cervical or supraclavicular adenopathy Lungs no rales or rhonchi Heart regular rate and rhythm Abd soft, nontender, positive bowel sounds MSK no focal spinal tenderness, no upper extremity lymphedema Neuro: nonfocal, well oriented, appropriate affect Breasts: Right breast is unremarkable.  The left breast is status post lumpectomy and radiation.  There is still hyperpigmentation.  There is some skin coarsening.  There is no evidence of residual or recurrent disease.  Both axillae are benign.     LAB RESULTS:  CMP     Component Value Date/Time   NA 138 09/01/2018 0830   K 3.3 (L) 09/01/2018  0830   CL 101 09/01/2018 0830   CO2 29 09/01/2018 0830   GLUCOSE 232 (H) 09/01/2018 0830   BUN 14 09/01/2018 0830   CREATININE 0.84 09/01/2018 0830   CREATININE 0.84 07/29/2018 1353   CALCIUM 9.6 09/01/2018 0830   PROT 7.2 07/29/2018 1353   ALBUMIN 4.0 09/01/2018 0830   AST 22 07/29/2018 1353   ALT 18 07/29/2018 1353   ALKPHOS 112 07/29/2018 1353   BILITOT 0.5 07/29/2018 1353   GFRNONAA >60 07/29/2018 1353   GFRAA >60 07/29/2018 1353    No results found for: TOTALPROTELP, ALBUMINELP, A1GS, A2GS, BETS, BETA2SER, GAMS, MSPIKE, SPEI  No results found for: KPAFRELGTCHN, LAMBDASER, KAPLAMBRATIO  Lab Results  Component Value Date   WBC 6.6 10/07/2018   NEUTROABS 4.7 10/07/2018   HGB 13.2 10/07/2018   HCT 40.2 10/07/2018   MCV 91.2 10/07/2018   PLT 207 10/07/2018    '@LASTCHEMISTRY' @  No results found for: LABCA2  No components found for: MLYYTK354  No results for input(s): INR in the last 168 hours.  No results found for: LABCA2  No results found for: SFK812  No results found for: XNT700  No results found for: FVC944  No results found for: CA2729  No components found for: HGQUANT  No results found for: CEA1 / No results found for: CEA1   No results found for: AFPTUMOR  No results found for: CHROMOGRNA  No results found for: PSA1  Appointment on 10/07/2018  Component Date Value Ref Range Status  . WBC Count 10/07/2018 6.6  4.0 - 10.5 K/uL Final  . RBC 10/07/2018 4.41  3.87 - 5.11 MIL/uL Final  . Hemoglobin 10/07/2018 13.2  12.0 - 15.0 g/dL Final  . HCT 10/07/2018 40.2  36.0 - 46.0 % Final  . MCV 10/07/2018 91.2  80.0 - 100.0 fL Final  . MCH 10/07/2018 29.9  26.0 - 34.0 pg Final  . MCHC 10/07/2018 32.8  30.0 - 36.0 g/dL Final  . RDW 10/07/2018 14.4  11.5 - 15.5 % Final  . Platelet Count 10/07/2018 207  150 - 400 K/uL Final  . nRBC 10/07/2018 0.0  0.0 - 0.2 % Final  . Neutrophils Relative % 10/07/2018 70  % Final  . Neutro Abs 10/07/2018 4.7  1.7 - 7.7  K/uL Final  . Lymphocytes Relative 10/07/2018 15  % Final  . Lymphs Abs 10/07/2018 1.0  0.7 - 4.0 K/uL Final  . Monocytes Relative 10/07/2018 11  %  Final  . Monocytes Absolute 10/07/2018 0.7  0.1 - 1.0 K/uL Final  . Eosinophils Relative 10/07/2018 3  % Final  . Eosinophils Absolute 10/07/2018 0.2  0.0 - 0.5 K/uL Final  . Basophils Relative 10/07/2018 1  % Final  . Basophils Absolute 10/07/2018 0.0  0.0 - 0.1 K/uL Final  . Immature Granulocytes 10/07/2018 0  % Final  . Abs Immature Granulocytes 10/07/2018 0.01  0.00 - 0.07 K/uL Final   Performed at [facility], [name] [place], [place] [address]    (this displays the last labs from the last 3 days)  No results found for: TOTALPROTELP, ALBUMINELP, A1GS, A2GS, BETS, BETA2SER, GAMS, MSPIKE, SPEI (this displays SPEP labs)  No results found for: KPAFRELGTCHN, LAMBDASER, KAPLAMBRATIO (kappa/lambda light chains)  No results found for: HGBA, HGBA2QUANT, HGBFQUANT, HGBSQUAN (Hemoglobinopathy evaluation)   No results found for: LDH  Lab Results  Component Value Date   IRON 104 09/27/2008   IRONPCTSAT 25.1 09/27/2008   (Iron and TIBC)  No results found for: FERRITIN  Urinalysis    Component Value Date/Time   COLORURINE YELLOW 02/15/2018 2014   APPEARANCEUR CLEAR 02/15/2018 2014   LABSPEC 1.011 02/15/2018 2014   PHURINE 7.0 02/15/2018 2014   [place] 02/15/2018 2014   [place] (A) 02/15/2018 2014   [place] NEGATIVE 02/15/2018 2014   [place] NEGATIVE 02/15/2018 2014   [place] NEGATIVE 02/15/2018 2014   [place] NEGATIVE 02/15/2018 2014   LEUKOCYTESUR NEGATIVE 02/15/2018 2014    STUDIES: Echocardiogram results discussed with the patient  ELIGIBLE FOR AVAILABLE RESEARCH PROTOCOL: Participating in exact sciences blood draw study   ASSESSMENT: 53 y.o.  [place], [place] woman status post left breast lower outer quadrant biopsy 12/23/2017 for a clinically multifocal T2 N0,  stage II invasive ductal carcinoma, grade 3, essentially estrogen receptor and progesterone receptor negative, but HER-2 amplified, with an MIB-1 of 90%.  (a) breast MRI 01/07/2018 shows the 2.6 cm main mass and 4 additional smaller masses spanning 7.1 cm; there was a suspicious left axillary lymph node  (b) left axillary lymph node biopsy 01/14/2018 was benign/discordant (no lymph node tissue)  (1) neoadjuvant chemotherapy consisting of carboplatin and docetaxel every 21 days for 6 cycles started 01/20/2018, completed 05/05/2018, given in conjunction with anti-HER-2 immunotherapy  (2) anti-HER-2 immunotherapy consisting of trastuzumab and Pertuzumab started 01/20/2018, to be continued for 6 months, last dose scheduled for 07/29/2018  (a) pertuzumab discontinued after cycle 2 due to diarrhea  (b) baseline echocardiogram 01/08/2018 shows an ejection fraction in the 60-65% range  (c) echocardiogram on 04/24/2018 shows an ejection fraction in the 65-70% range  (d) echocardiogram 07/31/2018 showed an ejection fraction in the 55-60% range  (3) left lumpectomy and sentinel lymph node biopsy 10 07 2019 showed a residual ypT1a ypN0 invasive ductal carcinoma, grade 3, with a repeat prognostic panel now triple negative  (4) adjuvant radiation 07/28/2018 - 09/15/2018  Site/dose: The patient initially received a dose of 50.4 Gy in 28 fractions to the left breast using whole-breast tangent fields. This was delivered using a 3-D conformal technique. The patient then received a boost to the seroma. This delivered an additional 10 Gy in 5 fractions using 6X, 10X photons with a Complex Isodose technique. The total dose was 60.4 Gy.    PLAN: Lindsey Wright has completed her local and systemic therapy.  She is now ready to start long-term observation.  I have sent her surgery note suggesting she may have her port removed at his and  her discretion.  She will have a repeat mammography in April and see me shortly  thereafter.  I will likely see her in October and then again yearly from that point, alternating with her surgeon.  I have encouraged her to continue her excellent walking program.  I have reassured her that the shooting pains she is experiencing in the breast are benign.  They may fade away but come back or simply fade away or not come back.  They are not due to breast cancer but to its treatments.  She knows to call for any other issue that may develop before her next visit here.    Magrinat, [name], MD  10/07/18 9:59 AM Medical Oncology and Hematology [facility] [address] [place], [place] [address] Tel. [phone]    Fax. [phone]  I, Jacqualyn Posey am acting as a Education administrator for Chauncey Cruel, MD.

## 2018-10-07 ENCOUNTER — Inpatient Hospital Stay: Payer: 59 | Attending: Oncology | Admitting: Oncology

## 2018-10-07 ENCOUNTER — Inpatient Hospital Stay: Payer: 59

## 2018-10-07 ENCOUNTER — Telehealth: Payer: Self-pay | Admitting: Oncology

## 2018-10-07 ENCOUNTER — Other Ambulatory Visit: Payer: 59

## 2018-10-07 VITALS — BP 136/80 | HR 70 | Temp 97.8°F | Resp 18 | Ht 66.5 in | Wt 190.7 lb

## 2018-10-07 DIAGNOSIS — D509 Iron deficiency anemia, unspecified: Secondary | ICD-10-CM | POA: Diagnosis not present

## 2018-10-07 DIAGNOSIS — R600 Localized edema: Secondary | ICD-10-CM

## 2018-10-07 DIAGNOSIS — Z171 Estrogen receptor negative status [ER-]: Secondary | ICD-10-CM | POA: Diagnosis not present

## 2018-10-07 DIAGNOSIS — I10 Essential (primary) hypertension: Secondary | ICD-10-CM

## 2018-10-07 DIAGNOSIS — Z79899 Other long term (current) drug therapy: Secondary | ICD-10-CM

## 2018-10-07 DIAGNOSIS — Z170 Estrogen receptor positive status [ER+]: Secondary | ICD-10-CM

## 2018-10-07 DIAGNOSIS — C50512 Malignant neoplasm of lower-outer quadrant of left female breast: Secondary | ICD-10-CM

## 2018-10-07 DIAGNOSIS — E669 Obesity, unspecified: Secondary | ICD-10-CM

## 2018-10-07 DIAGNOSIS — Z95828 Presence of other vascular implants and grafts: Secondary | ICD-10-CM

## 2018-10-07 DIAGNOSIS — Z923 Personal history of irradiation: Secondary | ICD-10-CM | POA: Diagnosis not present

## 2018-10-07 DIAGNOSIS — G629 Polyneuropathy, unspecified: Secondary | ICD-10-CM

## 2018-10-07 LAB — CBC WITH DIFFERENTIAL (CANCER CENTER ONLY)
ABS IMMATURE GRANULOCYTES: 0.01 10*3/uL (ref 0.00–0.07)
Basophils Absolute: 0 10*3/uL (ref 0.0–0.1)
Basophils Relative: 1 %
Eosinophils Absolute: 0.2 10*3/uL (ref 0.0–0.5)
Eosinophils Relative: 3 %
HCT: 40.2 % (ref 36.0–46.0)
Hemoglobin: 13.2 g/dL (ref 12.0–15.0)
IMMATURE GRANULOCYTES: 0 %
Lymphocytes Relative: 15 %
Lymphs Abs: 1 10*3/uL (ref 0.7–4.0)
MCH: 29.9 pg (ref 26.0–34.0)
MCHC: 32.8 g/dL (ref 30.0–36.0)
MCV: 91.2 fL (ref 80.0–100.0)
Monocytes Absolute: 0.7 10*3/uL (ref 0.1–1.0)
Monocytes Relative: 11 %
NEUTROS PCT: 70 %
Neutro Abs: 4.7 10*3/uL (ref 1.7–7.7)
Platelet Count: 207 10*3/uL (ref 150–400)
RBC: 4.41 MIL/uL (ref 3.87–5.11)
RDW: 14.4 % (ref 11.5–15.5)
WBC Count: 6.6 10*3/uL (ref 4.0–10.5)
nRBC: 0 % (ref 0.0–0.2)

## 2018-10-07 LAB — CMP (CANCER CENTER ONLY)
ALT: 24 U/L (ref 0–44)
AST: 22 U/L (ref 15–41)
Albumin: 3.8 g/dL (ref 3.5–5.0)
Alkaline Phosphatase: 115 U/L (ref 38–126)
Anion gap: 9 (ref 5–15)
BUN: 12 mg/dL (ref 6–20)
CO2: 26 mmol/L (ref 22–32)
Calcium: 9.2 mg/dL (ref 8.9–10.3)
Chloride: 104 mmol/L (ref 98–111)
Creatinine: 0.87 mg/dL (ref 0.44–1.00)
GFR, Est AFR Am: >60 mL/min (ref >60)
GFR, Estimated: >60 mL/min (ref >60)
Glucose, Bld: 128 mg/dL — ABNORMAL HIGH (ref 70–99)
POTASSIUM: 3.4 mmol/L — AB (ref 3.5–5.1)
SODIUM: 139 mmol/L (ref 135–145)
Total Bilirubin: 0.6 mg/dL (ref 0.3–1.2)
Total Protein: 7.5 g/dL (ref 6.5–8.1)

## 2018-10-07 MED ORDER — HEPARIN SOD (PORK) LOCK FLUSH 100 UNIT/ML IV SOLN
500.0000 [IU] | Freq: Once | INTRAVENOUS | Status: AC
  Administered 2018-10-07: 500 [IU]
  Filled 2018-10-07: qty 5

## 2018-10-07 MED ORDER — SODIUM CHLORIDE 0.9% FLUSH
10.0000 mL | Freq: Once | INTRAVENOUS | Status: AC
  Administered 2018-10-07: 10 mL
  Filled 2018-10-07: qty 10

## 2018-10-07 NOTE — Telephone Encounter (Signed)
Gave avs and calendar ° °

## 2018-10-10 ENCOUNTER — Ambulatory Visit: Payer: Self-pay | Admitting: General Surgery

## 2018-10-22 ENCOUNTER — Other Ambulatory Visit: Payer: Self-pay

## 2018-10-22 NOTE — Progress Notes (Signed)
Pt to come Friday or Monday for BMET, and to pick up Ensure.Bring all medications.

## 2018-10-28 ENCOUNTER — Encounter (HOSPITAL_BASED_OUTPATIENT_CLINIC_OR_DEPARTMENT_OTHER)
Admission: RE | Admit: 2018-10-28 | Discharge: 2018-10-28 | Disposition: A | Discharge status: Home / Self Care | Payer: 59 | Source: Ambulatory Visit | Attending: General Surgery | Admitting: General Surgery

## 2018-10-28 DIAGNOSIS — Z9221 Personal history of antineoplastic chemotherapy: Secondary | ICD-10-CM | POA: Diagnosis not present

## 2018-10-28 DIAGNOSIS — Z79899 Other long term (current) drug therapy: Secondary | ICD-10-CM | POA: Diagnosis not present

## 2018-10-28 DIAGNOSIS — C50512 Malignant neoplasm of lower-outer quadrant of left female breast: Secondary | ICD-10-CM | POA: Diagnosis not present

## 2018-10-28 DIAGNOSIS — Z01812 Encounter for preprocedural laboratory examination: Secondary | ICD-10-CM | POA: Insufficient documentation

## 2018-10-28 DIAGNOSIS — Z452 Encounter for adjustment and management of vascular access device: Secondary | ICD-10-CM | POA: Diagnosis not present

## 2018-10-28 DIAGNOSIS — Z170 Estrogen receptor positive status [ER+]: Secondary | ICD-10-CM | POA: Diagnosis not present

## 2018-10-28 DIAGNOSIS — I10 Essential (primary) hypertension: Secondary | ICD-10-CM | POA: Diagnosis not present

## 2018-10-28 LAB — BASIC METABOLIC PANEL
Anion gap: 11 (ref 5–15)
BUN: 11 mg/dL (ref 6–20)
CO2: 25 mmol/L (ref 22–32)
Calcium: 9 mg/dL (ref 8.9–10.3)
Chloride: 103 mmol/L (ref 98–111)
Creatinine, Ser: 0.76 mg/dL (ref 0.44–1.00)
GFR calc non Af Amer: >60 mL/min (ref >60)
Glucose, Bld: 148 mg/dL — ABNORMAL HIGH (ref 70–99)
Potassium: 3.5 mmol/L (ref 3.5–5.1)
Sodium: 139 mmol/L (ref 135–145)

## 2018-10-28 NOTE — Progress Notes (Signed)
Ensure pre surgery drink and surgical soap given to pt with instructions, pt verbalized understanding.

## 2018-10-30 ENCOUNTER — Encounter (HOSPITAL_BASED_OUTPATIENT_CLINIC_OR_DEPARTMENT_OTHER)
Admission: RE | Disposition: A | Discharge status: Home / Self Care | Payer: Self-pay | Source: Home / Self Care | Attending: General Surgery

## 2018-10-30 ENCOUNTER — Ambulatory Visit (HOSPITAL_BASED_OUTPATIENT_CLINIC_OR_DEPARTMENT_OTHER): Payer: 59 | Admitting: Anesthesiology

## 2018-10-30 ENCOUNTER — Ambulatory Visit (HOSPITAL_BASED_OUTPATIENT_CLINIC_OR_DEPARTMENT_OTHER)
Admission: RE | Admit: 2018-10-30 | Discharge: 2018-10-30 | Disposition: A | Discharge status: Home / Self Care | Payer: 59 | Attending: General Surgery | Admitting: General Surgery

## 2018-10-30 ENCOUNTER — Other Ambulatory Visit: Payer: Self-pay

## 2018-10-30 ENCOUNTER — Encounter (HOSPITAL_BASED_OUTPATIENT_CLINIC_OR_DEPARTMENT_OTHER): Payer: Self-pay | Admitting: *Deleted

## 2018-10-30 DIAGNOSIS — I10 Essential (primary) hypertension: Secondary | ICD-10-CM | POA: Insufficient documentation

## 2018-10-30 DIAGNOSIS — Z452 Encounter for adjustment and management of vascular access device: Secondary | ICD-10-CM | POA: Insufficient documentation

## 2018-10-30 DIAGNOSIS — C50512 Malignant neoplasm of lower-outer quadrant of left female breast: Secondary | ICD-10-CM | POA: Insufficient documentation

## 2018-10-30 DIAGNOSIS — Z79899 Other long term (current) drug therapy: Secondary | ICD-10-CM | POA: Insufficient documentation

## 2018-10-30 DIAGNOSIS — Z9221 Personal history of antineoplastic chemotherapy: Secondary | ICD-10-CM | POA: Insufficient documentation

## 2018-10-30 DIAGNOSIS — Z170 Estrogen receptor positive status [ER+]: Secondary | ICD-10-CM | POA: Insufficient documentation

## 2018-10-30 DIAGNOSIS — C50912 Malignant neoplasm of unspecified site of left female breast: Secondary | ICD-10-CM | POA: Diagnosis not present

## 2018-10-30 HISTORY — PX: PORT-A-CATH REMOVAL: SHX5289

## 2018-10-30 SURGERY — REMOVAL PORT-A-CATH
Anesthesia: Monitor Anesthesia Care | Site: Chest | Laterality: Right

## 2018-10-30 MED ORDER — GABAPENTIN 300 MG PO CAPS
ORAL_CAPSULE | ORAL | Status: AC
  Filled 2018-10-30: qty 1

## 2018-10-30 MED ORDER — HYDROCODONE-ACETAMINOPHEN 5-325 MG PO TABS: 1.0000 | ORAL_TABLET | Freq: Four times a day (QID) | ORAL | 0 refills | Status: DC | PRN

## 2018-10-30 MED ORDER — FENTANYL CITRATE (PF) 100 MCG/2ML IJ SOLN
INTRAMUSCULAR | Status: DC | PRN
  Administered 2018-10-30 (×2): 50 ug via INTRAVENOUS

## 2018-10-30 MED ORDER — MIDAZOLAM HCL 2 MG/2ML IJ SOLN: 1.0000 mg | INTRAMUSCULAR | Status: DC | PRN

## 2018-10-30 MED ORDER — PROPOFOL 500 MG/50ML IV EMUL
INTRAVENOUS | Status: DC | PRN
  Administered 2018-10-30: 125 ug/kg/min via INTRAVENOUS

## 2018-10-30 MED ORDER — FENTANYL CITRATE (PF) 100 MCG/2ML IJ SOLN: 25.0000 ug | INTRAMUSCULAR | Status: DC | PRN

## 2018-10-30 MED ORDER — PROPOFOL 500 MG/50ML IV EMUL
INTRAVENOUS | Status: AC
  Filled 2018-10-30: qty 50

## 2018-10-30 MED ORDER — CHLORHEXIDINE GLUCONATE CLOTH 2 % EX PADS: 6.0000 | MEDICATED_PAD | Freq: Once | CUTANEOUS | Status: DC

## 2018-10-30 MED ORDER — GABAPENTIN 300 MG PO CAPS
300.0000 mg | ORAL_CAPSULE | ORAL | Status: AC
  Administered 2018-10-30: 300 mg via ORAL

## 2018-10-30 MED ORDER — ACETAMINOPHEN 500 MG PO TABS
ORAL_TABLET | ORAL | Status: AC
  Filled 2018-10-30: qty 2

## 2018-10-30 MED ORDER — MIDAZOLAM HCL 2 MG/2ML IJ SOLN
INTRAMUSCULAR | Status: AC
  Filled 2018-10-30: qty 2

## 2018-10-30 MED ORDER — FENTANYL CITRATE (PF) 100 MCG/2ML IJ SOLN
INTRAMUSCULAR | Status: AC
  Filled 2018-10-30: qty 2

## 2018-10-30 MED ORDER — SCOPOLAMINE 1 MG/3DAYS TD PT72: 1.0000 | MEDICATED_PATCH | Freq: Once | TRANSDERMAL | Status: DC | PRN

## 2018-10-30 MED ORDER — CELECOXIB 200 MG PO CAPS
200.0000 mg | ORAL_CAPSULE | ORAL | Status: AC
  Administered 2018-10-30: 200 mg via ORAL

## 2018-10-30 MED ORDER — BUPIVACAINE HCL (PF) 0.25 % IJ SOLN
INTRAMUSCULAR | Status: DC | PRN
  Administered 2018-10-30: 10 mL

## 2018-10-30 MED ORDER — ACETAMINOPHEN 500 MG PO TABS
1000.0000 mg | ORAL_TABLET | ORAL | Status: AC
  Administered 2018-10-30: 1000 mg via ORAL

## 2018-10-30 MED ORDER — CELECOXIB 200 MG PO CAPS
ORAL_CAPSULE | ORAL | Status: AC
  Filled 2018-10-30: qty 1

## 2018-10-30 MED ORDER — MIDAZOLAM HCL 5 MG/5ML IJ SOLN
INTRAMUSCULAR | Status: DC | PRN
  Administered 2018-10-30: 2 mg via INTRAVENOUS

## 2018-10-30 MED ORDER — ONDANSETRON HCL 4 MG/2ML IJ SOLN
INTRAMUSCULAR | Status: DC | PRN
  Administered 2018-10-30: 4 mg via INTRAVENOUS

## 2018-10-30 MED ORDER — LACTATED RINGERS IV SOLN
INTRAVENOUS | Status: DC
  Administered 2018-10-30: 09:00:00 via INTRAVENOUS

## 2018-10-30 MED ORDER — ONDANSETRON HCL 4 MG/2ML IJ SOLN
INTRAMUSCULAR | Status: AC
  Filled 2018-10-30: qty 2

## 2018-10-30 MED ORDER — FENTANYL CITRATE (PF) 100 MCG/2ML IJ SOLN: 50.0000 ug | INTRAMUSCULAR | Status: DC | PRN

## 2018-10-30 SURGICAL SUPPLY — 28 items
ADH SKN CLS APL DERMABOND .7 (GAUZE/BANDAGES/DRESSINGS) ×1
BLADE SURG 15 STRL LF DISP TIS (BLADE) ×1 IMPLANT
BLADE SURG 15 STRL SS (BLADE) ×3
CHLORAPREP W/TINT 26ML (MISCELLANEOUS) ×3 IMPLANT
COVER BACK TABLE 60X90IN (DRAPES) ×3 IMPLANT
COVER MAYO STAND STRL (DRAPES) ×3 IMPLANT
COVER WAND RF STERILE (DRAPES) IMPLANT
DECANTER SPIKE VIAL GLASS SM (MISCELLANEOUS) ×3 IMPLANT
DERMABOND ADVANCED (GAUZE/BANDAGES/DRESSINGS) ×2
DERMABOND ADVANCED .7 DNX12 (GAUZE/BANDAGES/DRESSINGS) ×1 IMPLANT
DRAPE LAPAROTOMY 100X72 PEDS (DRAPES) ×3 IMPLANT
DRAPE UTILITY XL STRL (DRAPES) ×3 IMPLANT
ELECT COATED BLADE 2.86 ST (ELECTRODE) IMPLANT
ELECT REM PT RETURN 9FT ADLT (ELECTROSURGICAL)
ELECTRODE REM PT RTRN 9FT ADLT (ELECTROSURGICAL) IMPLANT
GLOVE BIO SURGEON STRL SZ7.5 (GLOVE) ×3 IMPLANT
GOWN STRL REUS W/ TWL LRG LVL3 (GOWN DISPOSABLE) ×2 IMPLANT
GOWN STRL REUS W/TWL LRG LVL3 (GOWN DISPOSABLE) ×6
NDL HYPO 25X1 1.5 SAFETY (NEEDLE) ×1 IMPLANT
NEEDLE HYPO 25X1 1.5 SAFETY (NEEDLE) ×3 IMPLANT
PACK BASIN DAY SURGERY FS (CUSTOM PROCEDURE TRAY) ×3 IMPLANT
PENCIL BUTTON HOLSTER BLD 10FT (ELECTRODE) IMPLANT
SLEEVE SCD COMPRESS KNEE MED (MISCELLANEOUS) IMPLANT
SUT MON AB 4-0 PC3 18 (SUTURE) ×3 IMPLANT
SUT VIC AB 3-0 SH 27 (SUTURE) ×3
SUT VIC AB 3-0 SH 27X BRD (SUTURE) ×1 IMPLANT
SYR CONTROL 10ML LL (SYRINGE) ×3 IMPLANT
TOWEL GREEN STERILE FF (TOWEL DISPOSABLE) ×3 IMPLANT

## 2018-10-30 NOTE — H&P (Signed)
Lindsey Wright  Location: [facility] Patient #: [id number] DOB: 1966-01-19 Married / Language: English / Race: Black or African American Female   History of Present Illness  The patient is a 53 year old female who presents for a follow-up for Breast cancer. The patient is a 53 year old white female who is about 2 months status post left breast lumpectomy and sentinel node mapping for a ypT1aypN0 left breast cancer that was ER/PR negative and HER-2 positive with a Ki-67 of 90%. She received neoadjuvant chemotherapy and her final pathology showed the tumor to be triple negative. She seems to be doing well now and denies any significant breast pain.   Allergies  ACE Inhibitors  Cough. Lotensin *ANTIHYPERTENSIVES*  Cough. Ace Inhibitors cause cough Allergies Reconciled   Medication History  Vitamin D (50000U Tablet, Oral) Active. Losartan Potassium (Oral) Specific strength unknown - Active. Potassium Chloride (20MEQ Tablet ER, Oral) Active. Medications Reconciled    Review of Systems  General Not Present- Appetite Loss, Chills, Fatigue, Fever, Night Sweats, Weight Gain and Weight Loss. Note: All other systems negative (unless as noted in HPI & included Review of Systems) Skin Not Present- Change in Wart/Mole, Dryness, Hives, Jaundice, New Lesions, Non-Healing Wounds, Rash and Ulcer. HEENT Not Present- Earache, Hearing Loss, Hoarseness, Nose Bleed, Oral Ulcers, Ringing in the Ears, Seasonal Allergies, Sinus Pain, Sore Throat, Visual Disturbances, Wears glasses/contact lenses and Yellow Eyes. Respiratory Not Present- Bloody sputum, Chronic Cough, Difficulty Breathing, Snoring and Wheezing. Breast Not Present- Breast Mass, Breast Pain, Nipple Discharge and Skin Changes. Cardiovascular Present- Chest Pain. Not Present- Difficulty Breathing Lying Down, Leg Cramps, Palpitations, Rapid Heart Rate, Shortness of Breath and Swelling of Extremities. Gastrointestinal Not  Present- Abdominal Pain, Bloating, Bloody Stool, Change in Bowel Habits, Chronic diarrhea, Constipation, Difficulty Swallowing, Excessive gas, Gets full quickly at meals, Hemorrhoids, Indigestion, Nausea, Rectal Pain and Vomiting. Female Genitourinary Not Present- Frequency, Nocturia, Painful Urination, Pelvic Pain and Urgency. Musculoskeletal Not Present- Back Pain, Joint Pain, Joint Stiffness, Muscle Pain, Muscle Weakness and Swelling of Extremities. Neurological Not Present- Decreased Memory, Fainting, Headaches, Numbness, Seizures, Tingling, Tremor, Trouble walking and Weakness. Psychiatric Not Present- Anxiety, Bipolar, Change in Sleep Pattern, Depression, Fearful and Frequent crying. Endocrine Not Present- Cold Intolerance, Excessive Hunger, Hair Changes, Heat Intolerance, Hot flashes and New Diabetes. Hematology Not Present- Easy Bruising, Excessive bleeding, Gland problems, HIV and Persistent Infections.  Vitals  Weight: 192.13 lb Height: 66in Body Surface Area: 1.97 m Body Mass Index: 31.01 kg/m  Temp.: 97.38F  Pulse: 78 (Regular)  P.OX: 98% (Room air) BP: 158/98 (Sitting, Left Arm, Standard)       Physical Exam  General Mental Status-Alert. General Appearance-Consistent with stated age. Hydration-Well hydrated. Voice-Normal.  Head and Neck Head-normocephalic, atraumatic with no lesions or palpable masses. Trachea-midline. Thyroid Gland Characteristics - normal size and consistency.  Eye Eyeball - Bilateral-Extraocular movements intact. Sclera/Conjunctiva - Bilateral-No scleral icterus.  Chest and Lung Exam Chest and lung exam reveals -quiet, even and easy respiratory effort with no use of accessory muscles and on auscultation, normal breath sounds, no adventitious sounds and normal vocal resonance. Inspection Chest Wall - Normal. Back - normal.  Breast Note: The outer left breast incision is healing nicely with no sign of infection  and only minimal seroma. The left breast is a little bit more swollen than the right breast. There is no palpable mass in either breast. There is no palpable axillary, supraclavicular, or cervical lymphadenopathy.   Cardiovascular Cardiovascular examination reveals -normal heart sounds, regular  rate and rhythm with no murmurs and normal pedal pulses bilaterally.  Abdomen Inspection Inspection of the abdomen reveals - No Hernias. Skin - Scar - no surgical scars. Palpation/Percussion Palpation and Percussion of the abdomen reveal - Soft, Non Tender, No Rebound tenderness, No Rigidity (guarding) and No hepatosplenomegaly. Auscultation Auscultation of the abdomen reveals - Bowel sounds normal.  Neurologic Neurologic evaluation reveals -alert and oriented x 3 with no impairment of recent or remote memory. Mental Status-Normal.  Musculoskeletal Normal Exam - Left-Upper Extremity Strength Normal and Lower Extremity Strength Normal. Normal Exam - Right-Upper Extremity Strength Normal and Lower Extremity Strength Normal.  Lymphatic Head & Neck  General Head & Neck Lymphatics: Bilateral - Description - Normal. Axillary  General Axillary Region: Bilateral - Description - Normal. Tenderness - Non Tender. Femoral & Inguinal  Generalized Femoral & Inguinal Lymphatics: Bilateral - Description - Normal. Tenderness - Non Tender.    Assessment & Plan  MALIGNANT NEOPLASM OF LOWER-OUTER QUADRANT OF LEFT BREAST OF FEMALE, ESTROGEN RECEPTOR POSITIVE (C50.512) Impression: The patient is about 2 months status post left breast lumpectomy for breast cancer. She is in the middle of radiation therapy. She will continue with this therapy. She will meet with her medical oncologist in January and decide whether she will need more chemotherapy or not. If she does not then she can have her port removed. I have discussed with her in detail the risks and benefits of the operation to remove the port as  well as some of the technical aspects and she understands and wishes to proceed. Otherwise I will see her back in about 3 months for a breast cancer checkup. Current Plans Follow up with [place] in the office in 3 MONTHS.  Call us sooner as needed.

## 2018-10-30 NOTE — Interval H&P Note (Signed)
History and Physical Interval Note:  10/30/2018 8:31 AM  Lindsey Wright  has presented today for surgery, with the diagnosis of left breast cancer  The various methods of treatment have been discussed with the patient and family. After consideration of risks, benefits and other options for treatment, the patient has consented to  Procedure(s): REMOVAL PORT-A-CATH (N/A) as a surgical intervention .  The patient's history has been reviewed, patient examined, no change in status, stable for surgery.  I have reviewed the patient's chart and labs.  Questions were answered to the patient's satisfaction.     [name]

## 2018-10-30 NOTE — Discharge Instructions (Signed)
No Tylenol or Ibuprofen until 2:45pm if needed   Post Anesthesia Home Care Instructions  Activity: Get plenty of rest for the remainder of the day. A responsible individual must stay with you for 24 hours following the procedure.  For the next 24 hours, DO NOT: -Drive a car -Paediatric nurse -Drink alcoholic beverages -Take any medication unless instructed by your physician -Make any legal decisions or sign important papers.  Meals: Start with liquid foods such as gelatin or soup. Progress to regular foods as tolerated. Avoid greasy, spicy, heavy foods. If nausea and/or vomiting occur, drink only clear liquids until the nausea and/or vomiting subsides. Call your physician if vomiting continues.  Special Instructions/Symptoms: Your throat may feel dry or sore from the anesthesia or the breathing tube placed in your throat during surgery. If this causes discomfort, gargle with warm salt water. The discomfort should disappear within 24 hours.  If you had a scopolamine patch placed behind your ear for the management of post- operative nausea and/or vomiting:  1. The medication in the patch is effective for 72 hours, after which it should be removed.  Wrap patch in a tissue and discard in the trash. Wash hands thoroughly with soap and water. 2. You may remove the patch earlier than 72 hours if you experience unpleasant side effects which may include dry mouth, dizziness or visual disturbances. 3. Avoid touching the patch. Wash your hands with soap and water after contact with the patch.

## 2018-10-30 NOTE — Anesthesia Preprocedure Evaluation (Addendum)
Anesthesia Evaluation  Patient identified by MRN, date of birth, ID band Patient awake    Reviewed: Allergy & Precautions, NPO status , Patient's Chart, lab work & pertinent test results  Airway Mallampati: II  TM Distance: >3 FB     Dental   Pulmonary neg pulmonary ROS,    breath sounds clear to auscultation       Cardiovascular hypertension,  Rhythm:Regular Rate:Normal     Neuro/Psych    GI/Hepatic negative GI ROS, Neg liver ROS,   Endo/Other  negative endocrine ROS  Renal/GU negative Renal ROS     Musculoskeletal   Abdominal   Peds  Hematology  (+) anemia ,   Anesthesia Other Findings   Reproductive/Obstetrics                            Anesthesia Physical Anesthesia Plan  ASA: III  Anesthesia Plan: MAC   Post-op Pain Management:    Induction: Intravenous  PONV Risk Score and Plan: 2 and Ondansetron, Dexamethasone and Midazolam  Airway Management Planned: Nasal Cannula and Simple Face Mask  Additional Equipment:   Intra-op Plan:   Post-operative Plan:   Informed Consent: I have reviewed the patients History and Physical, chart, labs and discussed the procedure including the risks, benefits and alternatives for the proposed anesthesia with the patient or authorized representative who has indicated his/her understanding and acceptance.     Dental advisory given  Plan Discussed with: CRNA, Anesthesiologist and Surgeon  Anesthesia Plan Comments:         Anesthesia Quick Evaluation

## 2018-10-30 NOTE — Op Note (Signed)
10/30/2018  10:12 AM  PATIENT:  Lindsey Wright  53 y.o. female  PRE-OPERATIVE DIAGNOSIS:  left breast cancer  POST-OPERATIVE DIAGNOSIS:  left breast cancer  PROCEDURE:  Procedure(s): REMOVAL PORT-A-CATH (Right)  SURGEON:  Surgeon(s) and Role:    * [name], MD - Primary  PHYSICIAN ASSISTANT:   ASSISTANTS: none   ANESTHESIA:   local and IV sedation  EBL:  1 mL   BLOOD ADMINISTERED:none  DRAINS: none   LOCAL MEDICATIONS USED:  MARCAINE     SPECIMEN:  No Specimen  DISPOSITION OF SPECIMEN:  N/A  COUNTS:  YES  TOURNIQUET:  * No tourniquets in log *  DICTATION: .Dragon Dictation   After informed consent was obtained the patient was brought to the operating room and placed in the supine position on the operating table.  After adequate induction of IV sedation had been given the patient's right chest was prepped with ChloraPrep, allowed to dry, and draped in usual sterile manner.  An appropriate timeout was performed.  The area around the port was infiltrated with quarter percent Marcaine.  A small incision was made with a 15 blade knife through her previous incision.  The incision was carried through the subcutaneous tissue sharply with the 15 blade knife until the capsule surrounding the port was opened.  The 2 anchoring stitches were divided and removed.  The port was then gently pushed out of the pocket and with gentle traction was removed from the patient without difficulty.  Pressure was held on the area for several minutes until it was completely hemostatic.  The deep layer of the wound was then closed with interrupted 3-0 Vicryl stitches.  The skin was then closed with a running 4-0 Monocryl subcuticular stitch.  Dermabond dressings were applied.  The patient tolerated the procedure well.  At the end of the case all needle sponge and instrument counts were correct.  The patient was then awakened and taken recovery in stable condition.  PLAN OF CARE: Discharge to  home after PACU  PATIENT DISPOSITION:  PACU - hemodynamically stable.   Delay start of Pharmacological VTE agent (>24hrs) due to surgical blood loss or risk of bleeding: not applicable

## 2018-10-30 NOTE — Anesthesia Postprocedure Evaluation (Signed)
Anesthesia Post Note  Patient: Lindsey Wright  Procedure(s) Performed: REMOVAL PORT-A-CATH (Right Chest)     Patient location during evaluation: PACU Anesthesia Type: MAC Level of consciousness: awake Pain management: pain level controlled Vital Signs Assessment: post-procedure vital signs reviewed and stable Respiratory status: spontaneous breathing Cardiovascular status: stable Postop Assessment: no apparent nausea or vomiting Anesthetic complications: no    Last Vitals:  Vitals:   10/30/18 1030 10/30/18 1045  BP: 120/74 131/71  Pulse: 76 61  Resp: 19 16  Temp:    SpO2: 99% 97%    Last Pain:  Vitals:   10/30/18 1045  TempSrc:   PainSc: 0-No pain                 Kyandra Mcclaine

## 2018-10-30 NOTE — Transfer of Care (Signed)
Immediate Anesthesia Transfer of Care Note  Patient: Lindsey Wright  Procedure(s) Performed: REMOVAL PORT-A-CATH (Right Chest)  Patient Location: PACU  Anesthesia Type:MAC  Level of Consciousness: awake, alert  and oriented  Airway & Oxygen Therapy: Patient Spontanous Breathing and Patient connected to face mask oxygen  Post-op Assessment: Report given to RN and Post -op Vital signs reviewed and stable  Post vital signs: Reviewed and stable  Last Vitals:  Vitals Value Taken Time  BP 121/74 10/30/2018 10:17 AM  Temp    Pulse 75 10/30/2018 10:20 AM  Resp 19 10/30/2018 10:20 AM  SpO2 99 % 10/30/2018 10:20 AM  Vitals shown include unvalidated device data.  Last Pain:  Vitals:   10/30/18 0839  TempSrc: Oral  PainSc: 0-No pain         Complications: No apparent anesthesia complications

## 2018-10-31 ENCOUNTER — Encounter (HOSPITAL_BASED_OUTPATIENT_CLINIC_OR_DEPARTMENT_OTHER): Payer: Self-pay | Admitting: General Surgery

## 2018-11-01 ENCOUNTER — Other Ambulatory Visit: Payer: Self-pay | Admitting: Family Medicine

## 2018-11-17 ENCOUNTER — Telehealth: Payer: Self-pay

## 2018-11-17 ENCOUNTER — Ambulatory Visit: Payer: 59 | Admitting: Family Medicine

## 2018-11-17 ENCOUNTER — Encounter: Payer: Self-pay | Admitting: Family Medicine

## 2018-11-17 VITALS — BP 124/80 | HR 65 | Temp 98.0°F | Ht 66.5 in | Wt 195.0 lb

## 2018-11-17 DIAGNOSIS — M545 Low back pain, unspecified: Secondary | ICD-10-CM

## 2018-11-17 DIAGNOSIS — I10 Essential (primary) hypertension: Secondary | ICD-10-CM

## 2018-11-17 MED ORDER — TRAMADOL HCL 50 MG PO TABS: 50.0000 mg | ORAL_TABLET | Freq: Three times a day (TID) | ORAL | 0 refills | Status: DC | PRN

## 2018-11-17 MED ORDER — CYCLOBENZAPRINE HCL 10 MG PO TABS: 10.0000 mg | ORAL_TABLET | Freq: Three times a day (TID) | ORAL | 0 refills | Status: DC | PRN

## 2018-11-17 MED ORDER — VALSARTAN 40 MG PO TABS: 40.0000 mg | ORAL_TABLET | Freq: Every day | ORAL | 11 refills | Status: DC

## 2018-11-17 NOTE — Assessment & Plan Note (Signed)
Suspect due to spasm No neuro changes Reassuring exam  Px tramadol for mod to severe pain  Flexeril 10 mg (up from 5) for spasm if helpful Warned of sedation/habit for both Continue heat 10 min at a time Walking  Urgent ref to PT Watch for neuro changes  inst to update if worse

## 2018-11-17 NOTE — Telephone Encounter (Signed)
[facility] Patient Name: Lindsey Wright Gender: Female DOB: [birth year] Age: 53 Y 2 M 25 D Return Phone Number: [phone] (Primary) Address: City/State/Zip: [place] [address] Client [facility] Night - Client Client Site [place] Physician Tower, Roque Lias - MD Contact Type Call Who Is Calling Patient / Member / Family / Caregiver Call Type Triage / Clinical Relationship To Patient Self Return Phone Number [phone] (Primary) Chief Complaint Back Pain - General Reason for Call Symptomatic / Request for [place] states she is experiencing back pain. Translation No Nurse Assessment Nurse: Julien Girt, RN, Almyra Free Date/Time Eilene Ghazi Time): 11/16/2018 12:36:38 PM Confirm and document reason for call. If symptomatic, describe symptoms. ---Caller states she is experiencing lower back pain that began on Wednesday. She called her insurance nurse on Saturday and was told to use heat and take Tylenol. Has the patient traveled to [place], [place], [place], [place], or [place] OR had close contact with a person known to have the novel coronavirus illness in the last 14 days? ---No Does the patient have any new or worsening symptoms? ---Yes Will a triage be completed? ---Yes Related visit to physician within the last 2 weeks? ---No Does the PT have any chronic conditions? (i.e. diabetes, asthma, this includes High risk factors for pregnancy, etc.) ---Yes List chronic conditions. ---Hx Breast CA/Port removed 2 weeks ago, Htn Is the patient pregnant or possibly pregnant? (Ask all females between the ages of 33-55) ---No Is this a behavioral health or substance abuse call? ---No Guidelines Guideline Title Affirmed Question Affirmed Notes Nurse Date/Time (Eastern Time) Back Pain High-risk adult (e.g., history of  cancer, HIV, or IV drug abuse) Chancy Hurter 11/16/2018 12:41:24 PM Disp. Time Eilene Ghazi Time) Disposition Final User 11/16/2018 12:53:17 PM Pharmacy Call Julien Girt, RN, Almyra Free Reason: Called [facility], spoke to PharmD, Rx called in per standing order. PLEASE NOTE: All timestamps contained within this report are represented as [place] Standard Time. CONFIDENTIALTY NOTICE: This fax transmission is intended only for the addressee. It contains information that is legally privileged, confidential or otherwise protected from use or disclosure. If you are not the intended recipient, you are strictly prohibited from reviewing, disclosing, copying using or disseminating any of this information or taking any action in reliance on or regarding this information. If you have received this fax in error, please notify us immediately by telephone so that we can arrange for its return to [place]. Phone: [phone], Toll-Free: [phone], Fax: [phone] Page: 2 of 3 Call Id: [id number] 11/16/2018 12:49:55 PM See PCP within 24 Hours Yes Julien Girt, RN, Dagoberto Reef Disagree/Comply Comply Caller Understands Yes PreDisposition Did not know what to do Care Advice Given Per Guideline SEE PCP WITHIN 24 HOURS: * IF OFFICE WILL BE OPEN: You need to be seen within the next 24 hours. Call your doctor (or NP/PA) when the office opens and make an appointment. * For pain relief, take acetaminophen, ibuprofen, or naproxen. * Use the lowest amount that makes your pain feel better. ACETAMINOPHEN (E.G., TYLENOL): IBUPROFEN (E.G., MOTRIN, ADVIL): * Take 400 mg (two 200 mg pills) by mouth every 6 hours as needed. * The most you should take each day is 1,200 mg (six 200 mg pills a day), unless your doctor has told you to take more. * You become worse. CARE ADVICE given per Back Pain (Adult) guideline. Standing Orders Preparation Additional Instructions Route Frequency  Duration Nurse Comments User Name Flexeril 5 mg Quantity sufficient  until next business day Oral Three Times Daily 7 Days Julien Girt, RN, Almyra Free Referrals REFERRED TO PCP OFFICE

## 2018-11-17 NOTE — Progress Notes (Signed)
Subjective:    Patient ID: Lindsey Wright, female    DOB: 06-28-1966, 53 y.o.   MRN: [id number]  HPI Here for back pain   Started Wednesday  No trauma or new activity Pain got worse on Friday   Low back pain  Both sides  Sharp with movement  Otherwise dull   Lying in bed is the most uncomfortable  Sleeping sitting on couch  Hurts to bend forward and get up from sitting  Does not shoot down leg  No numbness or weakness   She had back pain like this once during chemo  Was told it was from chemo-months ago  No urinary symptoms  Call nurse gave her flexeril 5 mg to take over the weekend - did not help   otc : Took some tylenol  No nsaids- ibuprofen gave her hives once    Also pharmacy is out of losartan  BP Readings from Last 3 Encounters:  11/17/18 124/80  10/30/18 134/83  10/07/18 136/80     Wt Readings from Last 3 Encounters:  11/17/18 195 lb (88.5 kg)  10/30/18 192 lb 10.9 oz (87.4 kg)  10/07/18 190 lb 11.2 oz (86.5 kg)   31.00 kg/m   Patient Active Problem List   Diagnosis Date Noted  . Low back pain 11/17/2018  . Port-A-Cath in place 01/20/2018  . Malignant neoplasm of lower-outer quadrant of 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, estrogen receptor positive ([place]) 12/26/2017  . Colon cancer screening 01/29/2017  . Obesity (BMI 30-39.9) 10/29/2016  . Prediabetes 09/25/2013  . Essential hypertension 08/15/2007   Past Medical History:  Diagnosis Date  . Anemia    iron deficient anemia  . Breast cancer ([place])   . History of Bell's palsy     x 2 as teenager and in 20's  . Hypertension   . Menorrhagia   . Overweight(278.02)   . Peripheral edema    Past Surgical History:  Procedure Laterality Date  . BREAST BIOPSY Right 02/01/2015   benign  . BREAST LUMPECTOMY WITH RADIOACTIVE SEED AND SENTINEL LYMPH NODE BIOPSY Left 06/16/2018   Procedure: LEFT BREAST LUMPECTOMY WITH RADIOACTIVE SEED X'S 2 WITH SEED TARGETED LYMPH NODE EXCISION AND SENTINEL LYMPH NODE  BIOPSY;  Surgeon: [name], MD;  Location: [facility];  Service: General;  Laterality: Left;  . CESAREAN SECTION CLASSICAL    . CHOLECYSTECTOMY    . COSMETIC SURGERY    . PORT-A-CATH REMOVAL Right 10/30/2018   Procedure: REMOVAL PORT-A-CATH;  Surgeon: [name], MD;  Location: [facility];  Service: General;  Laterality: Right;  . PORTACATH PLACEMENT Right 01/17/2018   Procedure: INSERTION PORT-A-CATH;  Surgeon: [name], MD;  Location: [facility];  Service: General;  Laterality: Right;  . TUBAL LIGATION     Social History   Tobacco Use  . Smoking status: Never Smoker  . Smokeless tobacco: Never Used  Substance Use Topics  . Alcohol use: No    Alcohol/week: 0.0 standard drinks  . Drug use: No   Family History  Problem Relation Age of Onset  . Hypertension Mother   . Diabetes Mother   . Stroke Mother   . Alcohol abuse Father   . Cancer Father        lung ca  . Hypertension Father   . Hypertension Sister   . Colon cancer Neg Hx   . Colon polyps Neg Hx   . Rectal cancer Neg Hx   .  Stomach cancer Neg Hx    Allergies  Allergen Reactions  . Ace Inhibitors Cough  . Advil [Ibuprofen]     Hives    Current Outpatient Medications on File Prior to Visit  Medication Sig Dispense Refill  . cholecalciferol (VITAMIN D) 1000 units tablet Take 1 tablet (1,000 Units total) by mouth daily. 100 tablet 4  . KLOR-CON M20 20 MEQ tablet TAKE 2 TABLETS (40 MEQ TOTAL) BY MOUTH DAILY. 60 tablet 5  . triamterene-hydrochlorothiazide (MAXZIDE) 75-50 MG tablet Take 0.5 tablets by mouth daily. 15 tablet 11  . [DISCONTINUED] prochlorperazine (COMPAZINE) 10 MG tablet Take 1 tablet (10 mg total) by mouth every 6 (six) hours as needed (Nausea or vomiting). 30 tablet 1   No current facility-administered medications on file prior to visit.     Review of Systems  Constitutional: Positive for fatigue. Negative for activity change, appetite change,  fever and unexpected weight change.  HENT: Negative for congestion, ear pain, rhinorrhea, sinus pressure and sore throat.   Eyes: Negative for pain, redness and visual disturbance.  Respiratory: Negative for cough, shortness of breath and wheezing.   Cardiovascular: Negative for chest pain and palpitations.  Gastrointestinal: Negative for abdominal pain, blood in stool, constipation and diarrhea.  Endocrine: Negative for polydipsia and polyuria.  Genitourinary: Negative for dysuria, frequency and urgency.       No incontinence  Musculoskeletal: Positive for back pain. Negative for arthralgias, joint swelling and myalgias.  Skin: Negative for pallor and rash.  Allergic/Immunologic: Negative for environmental allergies.  Neurological: Negative for dizziness, syncope, weakness, numbness and headaches.  Hematological: Negative for adenopathy. Does not bruise/bleed easily.  Psychiatric/Behavioral: Negative for decreased concentration and dysphoric mood. The patient is not nervous/anxious.        Objective:   Physical Exam Constitutional:      General: She is not in acute distress.    Appearance: Normal appearance. She is well-developed. She is obese. She is not ill-appearing.     Comments: Uncomfortable but not in distress  HENT:     Head: Normocephalic and atraumatic.     Mouth/Throat:     Mouth: Mucous membranes are moist.  Eyes:     General: No scleral icterus.    Conjunctiva/sclera: Conjunctivae normal.     Pupils: Pupils are equal, round, and reactive to light.  Neck:     Musculoskeletal: Normal range of motion and neck supple.  Cardiovascular:     Rate and Rhythm: Normal rate and regular rhythm.  Pulmonary:     Effort: Pulmonary effort is normal.     Breath sounds: Normal breath sounds. No wheezing or rales.  Abdominal:     General: Bowel sounds are normal. There is no distension.  Musculoskeletal:        General: Tenderness present.     Right shoulder: She exhibits  decreased range of motion, tenderness, pain and spasm. She exhibits no bony tenderness, no swelling, no crepitus, normal pulse and normal strength.     Lumbar back: She exhibits decreased range of motion, tenderness and spasm. She exhibits no bony tenderness and no edema.     Comments: LS: mild loss of lordosis with tight lumbar musculature  Flex 30 deg Ext 10 deg  R lateral bend is painful  Slow/guarded gait  Neg SLR    Lymphadenopathy:     Cervical: No cervical adenopathy.  Skin:    General: Skin is warm and dry.     Coloration: Skin is not pale.  Findings: No erythema or rash.  Neurological:     Mental Status: She is alert.     Cranial Nerves: No cranial nerve deficit.     Sensory: No sensory deficit.     Motor: Motor function is intact. No atrophy or abnormal muscle tone.     Coordination: Coordination normal.     Deep Tendon Reflexes: Reflexes are normal and symmetric. Reflexes normal.     Comments: Negative SLR  5/5 strength in LEs     Psychiatric:        Mood and Affect: Mood normal.           Assessment & Plan:   Problem List Items Addressed This Visit      Cardiovascular and Mediastinum   Essential hypertension    bp in fair control at this time  BP Readings from Last 1 Encounters:  11/17/18 124/80   No changes needed Most recent labs reviewed  Disc lifstyle change with low sodium diet and exercise        Relevant Medications   valsartan (DIOVAN) 40 MG tablet     Other   Low back pain - Primary    Suspect due to spasm No neuro changes Reassuring exam  Px tramadol for mod to severe pain  Flexeril 10 mg (up from 5) for spasm if helpful Warned of sedation/habit for both Continue heat 10 min at a time Walking  Urgent ref to PT Watch for neuro changes  inst to update if worse       Relevant Medications   cyclobenzaprine (FLEXERIL) 10 MG tablet   traMADol (ULTRAM) 50 MG tablet   Other Relevant Orders   Ambulatory referral to Physical  Therapy

## 2018-11-17 NOTE — Patient Instructions (Signed)
Use heat on back  Slow walking is best  Referral for physical therapy today   Tramadol for moderate to severe pain  Flexeril muscle relaxer (higher dose) for spasm  Both sedate- use caution   If symptoms suddenly worsen please alert [place]

## 2018-11-17 NOTE — Progress Notes (Signed)
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 This was delivered using a 3-D conformal technique. The patient then received a boost to the seroma. This delivered an additional 10 Gy in 5 fractions using 6X, 10X photons with a Complex Isodose technique. The total dose was 60.4 Gy.  Narrative:  The patient returns today for routine follow-up. During treatment she did very well with radiotherapy and did have mild moist desquamation in the inframammary fold.                             On review of systems, the patient states she is doing well. She reports her skin healed up well but is hyperpigmented. She's using coco butter on her skin currently. She denies any other concerns regarding radiotherapy. She is having some problems however with low back pain and was seen by her PCP and going to see PT tomorrow. No other complaints are verbalized.  ALLERGIES:  is allergic to ace inhibitors and advil [ibuprofen].  Meds: Current Outpatient Medications  Medication Sig Dispense Refill  . cholecalciferol (VITAMIN D) 1000 units tablet Take 1 tablet (1,000 Units total) by mouth daily. 100 tablet 4  . cyclobenzaprine (FLEXERIL) 10 MG tablet Take 1 tablet (10 mg total) by mouth 3 (three) times daily as needed for muscle spasms. Caution of sedation 30 tablet 0  . KLOR-CON M20 20 MEQ tablet TAKE 2 TABLETS (40 MEQ TOTAL) BY MOUTH  DAILY. 60 tablet 5  . traMADol (ULTRAM) 50 MG tablet Take 1 tablet (50 mg total) by mouth every 8 (eight) hours as needed for moderate pain or severe pain. Caution of sedation 20 tablet 0  . triamterene-hydrochlorothiazide (MAXZIDE) 75-50 MG tablet Take 0.5 tablets by mouth daily. 15 tablet 11  . valsartan (DIOVAN) 40 MG tablet Take 1 tablet (40 mg total) by mouth daily. 30 tablet 11   No current facility-administered medications for this encounter.     Physical Findings:  height is '5\' 6"'$  (1.676 m) and weight is 196 lb 2 oz (89 kg). Her oral temperature is 98 F (36.7 C). Her blood pressure is 131/78 and her pulse is 93. Her respiration is 20 and oxygen saturation is 97%.   /10 In general this is a well appearing African American female in no acute distress. She's alert and oriented x4 and appropriate throughout the examination. Cardiopulmonary assessment is negative for acute distress and she exhibits normal effort. The left breast was examined and reveals mild hyperpigmentation in the axilla and breast. Her inframammary fold is well healed but also hyperpigmented.   Lab Findings: Lab Results  Component Value Date   WBC 6.6 10/07/2018   HGB 13.2 10/07/2018   HCT 40.2 10/07/2018   MCV 91.2 10/07/2018   PLT 207 10/07/2018     Radiographic Findings: No results found.  Impression/Plan: 1. 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 The patient has been doing well since completion of radiotherapy. We discussed that we would be happy to continue to follow her as needed, but she will also continue to follow up with Dr. Jana Hakim in medical oncology. She was counseled on skin care as well as measures to avoid sun exposure to this area.  2. Survivorship. We discussed the importance of survivorship evaluation and she is currently scheduled for  this in the near future. She was also given the monthly calendar for access to resources offered within the cancer center. 3. Low back pain. She will follow up with PT and her PCP for further management of this.     Carola Rhine, PAC

## 2018-11-17 NOTE — Telephone Encounter (Signed)
Per chart review tab pt has appt with Dr Glori Bickers 11/17/18 at 10:15.

## 2018-11-17 NOTE — Assessment & Plan Note (Signed)
bp in fair control at this time  BP Readings from Last 1 Encounters:  11/17/18 124/80   No changes needed Most recent labs reviewed  Disc lifstyle change with low sodium diet and exercise

## 2018-11-18 ENCOUNTER — Encounter: Payer: Self-pay | Admitting: Radiation Oncology

## 2018-11-18 ENCOUNTER — Other Ambulatory Visit: Payer: Self-pay

## 2018-11-18 ENCOUNTER — Ambulatory Visit
Admission: RE | Admit: 2018-11-18 | Discharge: 2018-11-18 | Disposition: A | Discharge status: Home / Self Care | Payer: 59 | Source: Ambulatory Visit | Attending: Radiation Oncology | Admitting: Radiation Oncology

## 2018-11-18 VITALS — BP 131/78 | HR 93 | Temp 98.0°F | Resp 20 | Ht 66.0 in | Wt 196.1 lb

## 2018-11-18 DIAGNOSIS — Z170 Estrogen receptor positive status [ER+]: Secondary | ICD-10-CM | POA: Diagnosis not present

## 2018-11-18 DIAGNOSIS — Z923 Personal history of irradiation: Secondary | ICD-10-CM | POA: Insufficient documentation

## 2018-11-18 DIAGNOSIS — M545 Low back pain: Secondary | ICD-10-CM | POA: Insufficient documentation

## 2018-11-18 DIAGNOSIS — Z79899 Other long term (current) drug therapy: Secondary | ICD-10-CM | POA: Insufficient documentation

## 2018-11-18 DIAGNOSIS — C50512 Malignant neoplasm of lower-outer quadrant of left female breast: Secondary | ICD-10-CM

## 2018-11-19 DIAGNOSIS — M62830 Muscle spasm of back: Secondary | ICD-10-CM | POA: Diagnosis not present

## 2018-11-19 DIAGNOSIS — M2560 Stiffness of unspecified joint, not elsewhere classified: Secondary | ICD-10-CM | POA: Diagnosis not present

## 2018-11-19 DIAGNOSIS — M545 Low back pain: Secondary | ICD-10-CM | POA: Diagnosis not present

## 2018-11-20 IMAGING — CT CT ANGIO CHEST
2 of 7 series · 18 of 46 positions shown · IV contrast (ISOVUE)
Comparison: Chest radiograph performed 01/17/2018

CLINICAL DATA: Acute onset of syncope. Elevated D-dimer.

EXAM:
CT ANGIOGRAPHY CHEST WITH CONTRAST
TECHNIQUE: Multidetector CT imaging of the chest was performed using the
standard protocol during bolus administration of intravenous
contrast. Multiplanar CT image reconstructions and MIPs were
obtained to evaluate the vascular anatomy.
CONTRAST:  100mL WHDC3C-2II IOPAMIDOL (WHDC3C-2II) INJECTION 76%

[Series 5: thins · axial · 0.62mm/px · z∈[+1596,+1826]mm · 15 of 261 slices shown]
[im 16/261  lung]
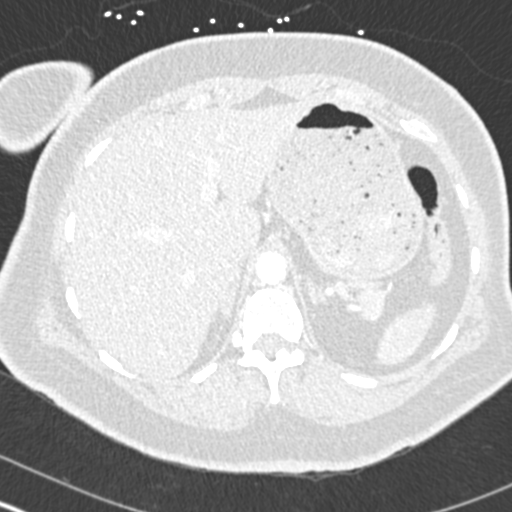
[im 31/261  soft-tissue]
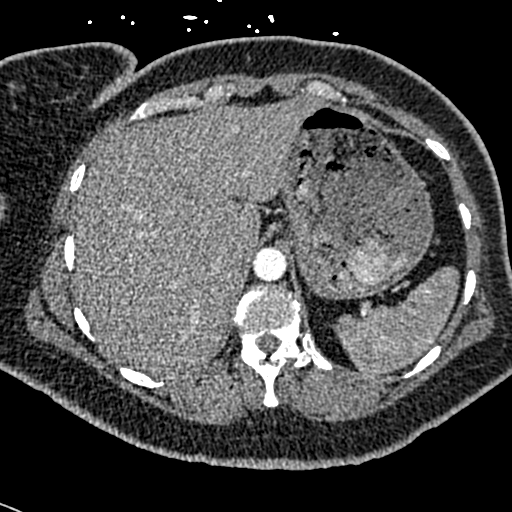
[im 46/261  lung]
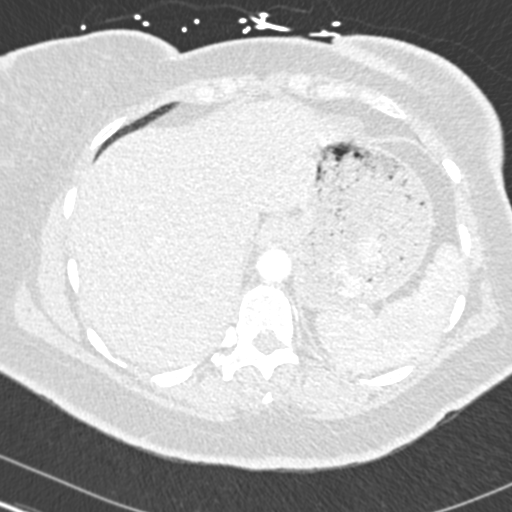
[im 62/261  soft-tissue]
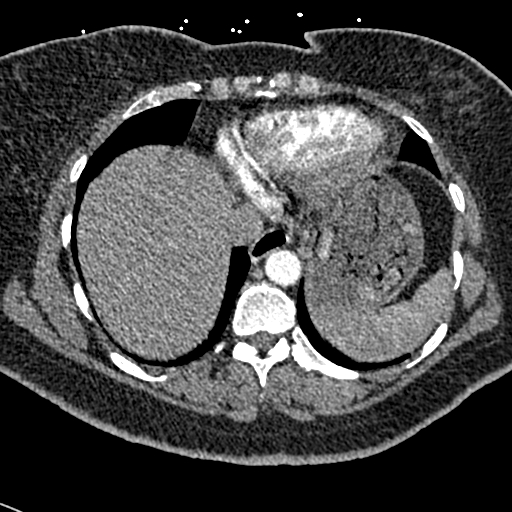
[im 77/261  lung]
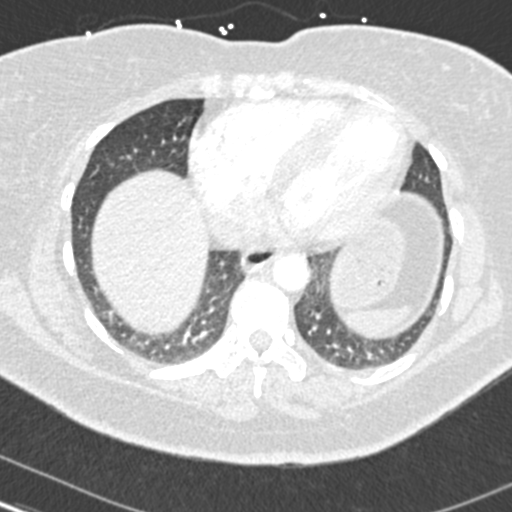
[im 92/261  soft-tissue]
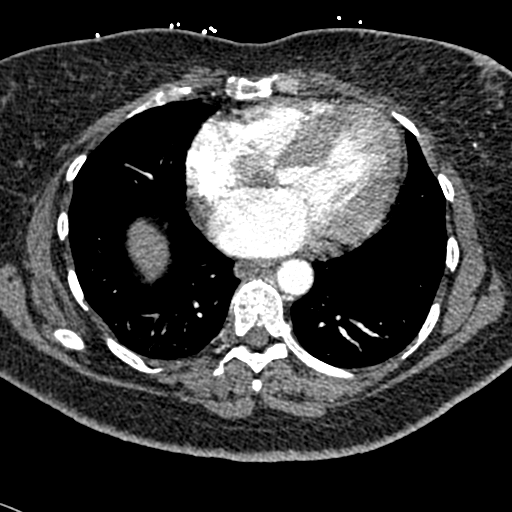
[im 108/261  lung]
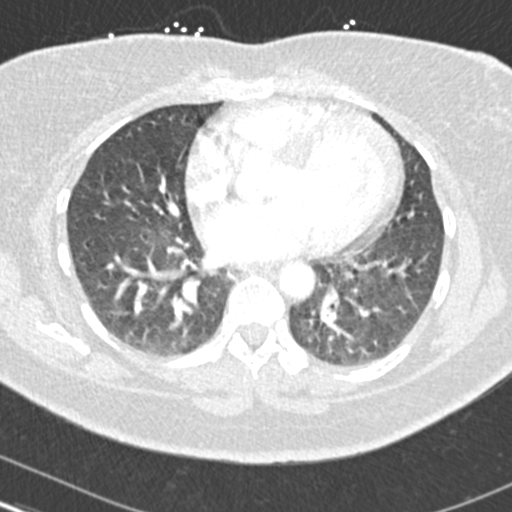
[im 138/261  soft-tissue]
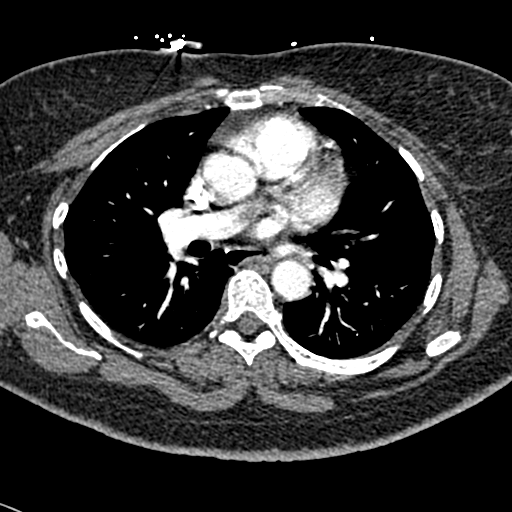
[im 153/261  lung]
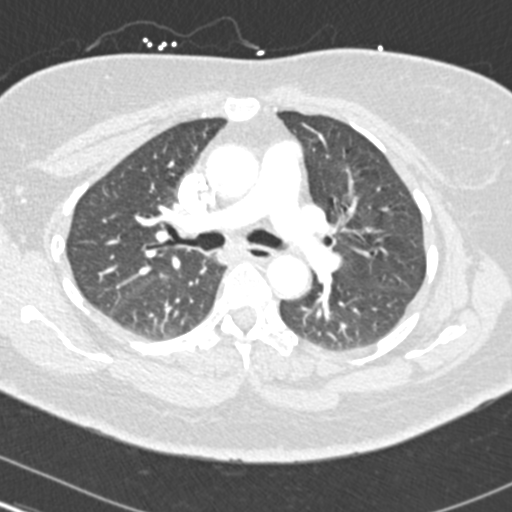
[im 169/261  soft-tissue]
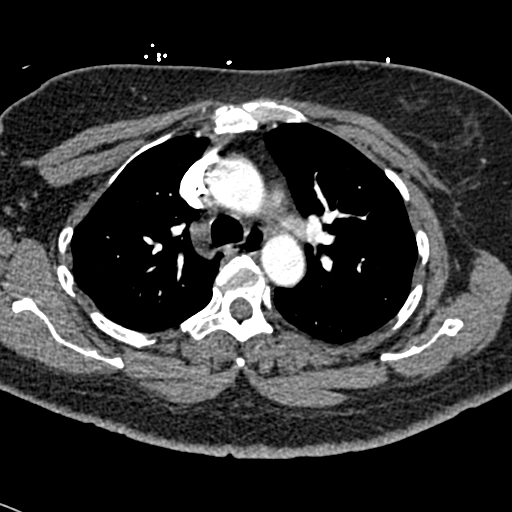
[im 184/261  lung]
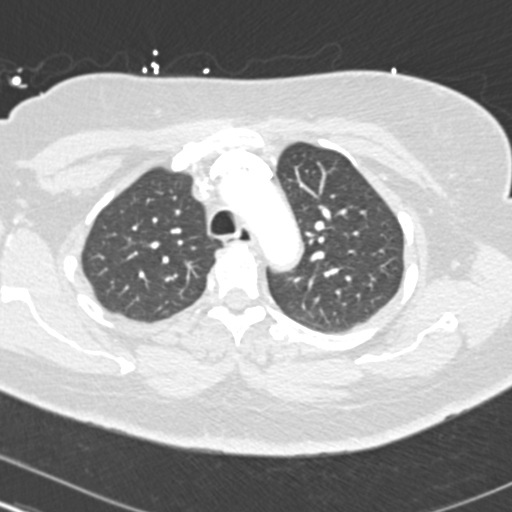
[im 199/261  soft-tissue]
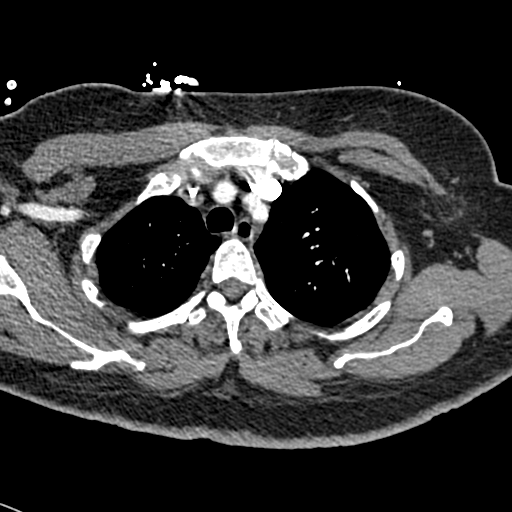
[im 215/261  lung]
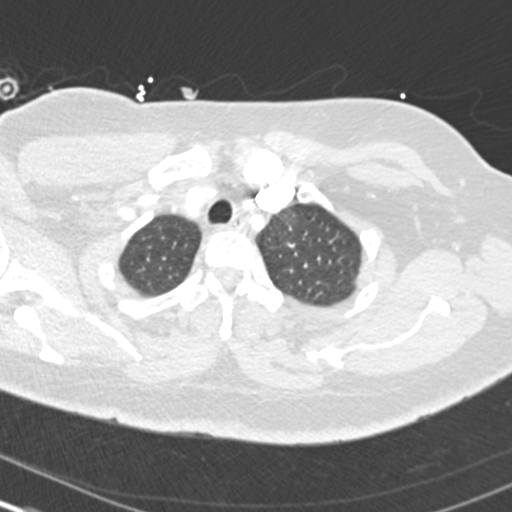
[im 230/261  soft-tissue]
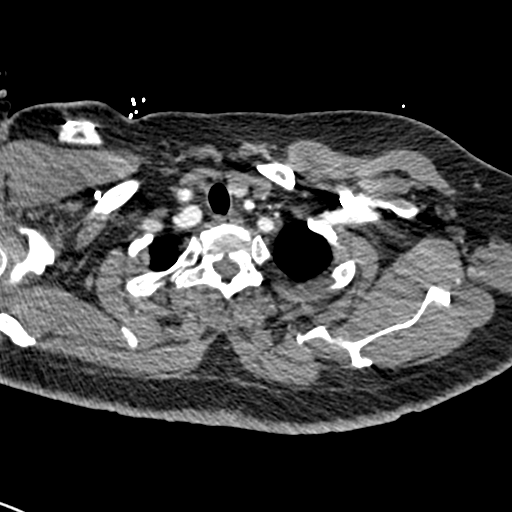
[im 245/261  lung]
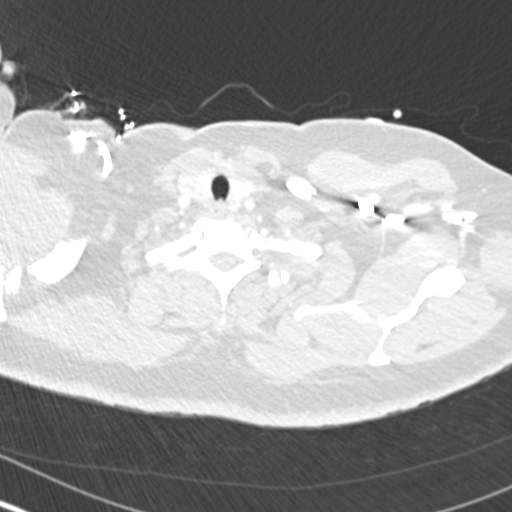

[Series 7: coronal mpr · coronal · 0.53mm/px · 3 of 145 slices shown]
[im 37/145  soft-tissue]
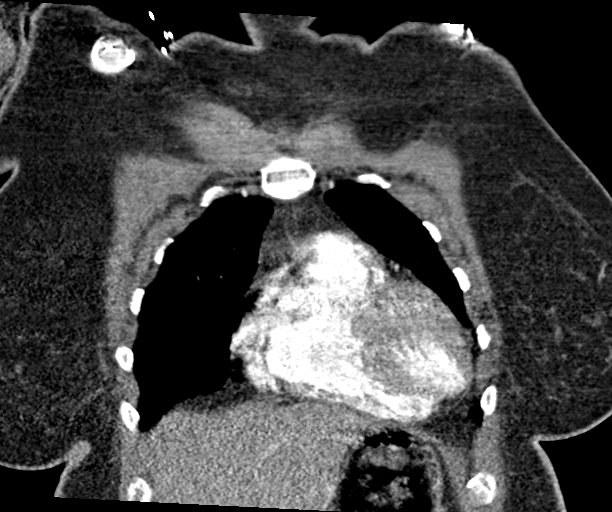
[im 73/145  soft-tissue]
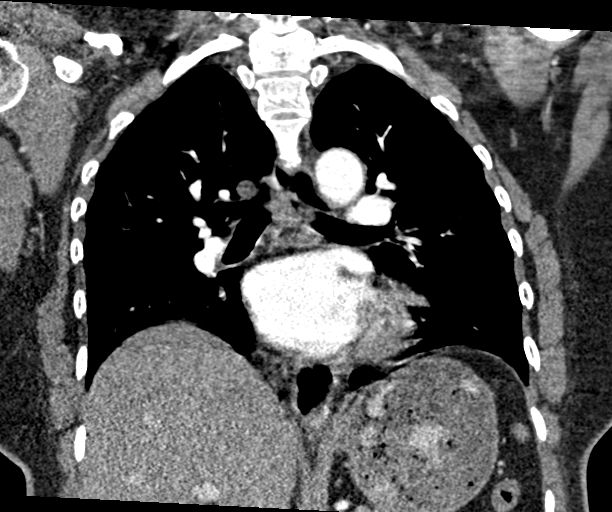
[im 109/145  soft-tissue]
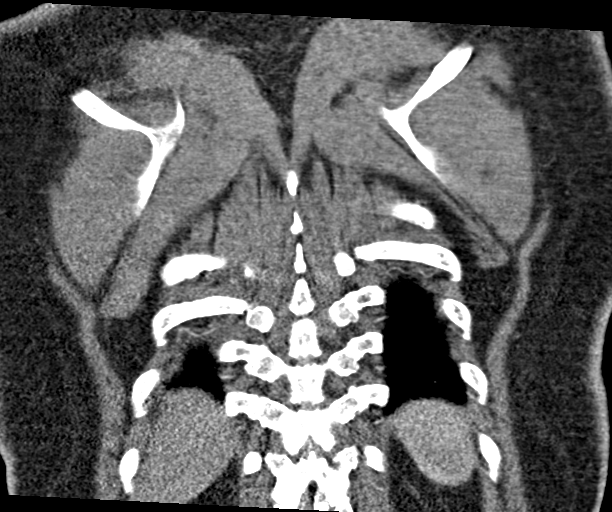

[18 of 46 positions shown; findings below may reference images not displayed]

FINDINGS: Cardiovascular: There is no evidence of pulmonary embolus. The
thoracic aorta is unremarkable. The great vessels are within normal
limits.

The heart is borderline normal in size.

Mediastinum/Nodes: The mediastinum is unremarkable. No mediastinal
lymphadenopathy is seen. No pericardial effusion is identified. A
1.3 cm hypodensity is noted at the right thyroid lobe, likely benign
given its size. No axillary lymphadenopathy is seen.

The patient's right-sided chest port is noted ending about the right
cavoatrial junction.

Lungs/Pleura: The lungs are essentially clear bilaterally. No focal
consolidation, pleural effusion or pneumothorax is seen. No masses
are identified.

Upper Abdomen: The visualized portions of the liver and spleen are
unremarkable.

Musculoskeletal: No acute osseous abnormalities are identified. The
visualized musculature is unremarkable in appearance.

A 2.1 cm soft tissue nodule is noted at the 4 o'clock position of
the left breast, relatively close to the nipple. Would correlate
with the patient's recent treatment for breast cancer.

Review of the MIP images confirms the above findings.
IMPRESSION: 1. No evidence of pulmonary embolus.
2. Lungs clear bilaterally.
3. 2.1 cm soft tissue nodule at the 4 o'clock position of the left
breast, relatively close to the nipple. Would correlate with the
patient's recent treatment for breast cancer.

## 2018-12-12 ENCOUNTER — Encounter: Payer: 59 | Admitting: Adult Health

## 2018-12-24 NOTE — Telephone Encounter (Signed)
Pt seen 11/17/18.

## 2018-12-30 ENCOUNTER — Ambulatory Visit: Payer: 59 | Admitting: Oncology

## 2018-12-30 ENCOUNTER — Other Ambulatory Visit: Payer: 59

## 2019-01-27 ENCOUNTER — Ambulatory Visit
Admission: RE | Admit: 2019-01-27 | Discharge: 2019-01-27 | Disposition: A | Discharge status: Home / Self Care | Payer: 59 | Source: Ambulatory Visit | Attending: Oncology | Admitting: Oncology

## 2019-01-27 ENCOUNTER — Other Ambulatory Visit: Payer: Self-pay

## 2019-01-27 DIAGNOSIS — C50512 Malignant neoplasm of lower-outer quadrant of left female breast: Secondary | ICD-10-CM | POA: Diagnosis not present

## 2019-02-05 NOTE — Progress Notes (Signed)
[facility]  [phone] Fax:([phone]) [phone]     ID: Lucie Friedlander Devos DOB: 09-02-1966  MR#: [id number]  XKG#:[id number]  Patient Care Team: [name], MD as PCP - General Deral Schellenberg, [name], MD as Consulting Physician (Oncology) Vania Rea, MD as Consulting Physician (Obstetrics and Gynecology) [name], MD as Consulting Physician (General Surgery) Kyung Rudd, MD as Consulting Physician (Radiation Oncology) OTHER MD:  I connected with Damaris Schooner Keeran on 02/09/19 at  9:00 AM EDT by video enabled telemedicine visit and verified that I am speaking with the correct person using two identifiers.   I discussed the limitations, risks, security and privacy concerns of performing an evaluation and management service by telemedicine and the availability of in-person appointments. I also discussed with the patient that there may be a patient responsible charge related to this service. The patient expressed understanding and agreed to proceed.   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   Since her last visit, she underwent bilateral diagnostic mammography with tomography at [facility] on 01/27/2019 showing: breast density category B; no evidence of malignancy in either breast.    REVIEW OF SYSTEMS: Lindsey Wright reports doing well overall. She notes her breast remains porous following the radiation. She also notes occasional shooting pains, as expected. 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 She lives with her husband and daughters. Her husband  works from home, and her daughter works at the hospital. She reports her daughters are helping her exercise.  The patient denies unusual headaches, visual changes, nausea, vomiting, stiff neck, dizziness, or gait imbalance. There has been no cough, phlegm production, or pleurisy, no chest pain or pressure, and no change in bowel or bladder habits. The patient denies fever, rash, bleeding, unexplained fatigue or unexplained weight loss. A detailed review of systems was otherwise entirely negative.   HISTORY OF CURRENT ILLNESS: From the original intake note:  "Lindsey Wright" palpated a mass in the left breast about 2 weeks ago. She followed up with her gynecologist, Dr. Jean Rosenthal who referred her to the [facility] for mammography. She underwent unilateral left diagnostic mammography with tomography and left breast ultrasonography at [facility] on 12/20/2017 showing: breast density category B. Suspicious newly palpable left breast mass at the 5 o'clock lower outer position with internal blow flow measuring 2.3 x 1.8 x 2.5 cm. There are either 2 adjacent masses or a single complicated mass at the 7:02 lower outer position, 4 cm from the nipple measuring 1.8 x 0.5 by 0.9 cm which may represent fibrocystic changes versus a solid mass. No other suspicious findings. Ultrasound showed normal axillary nodes.  Accordingly on 12/23/2017 she proceeded to biopsy of the left breast areas in question. The pathology from this procedure showed ([id number]): At both the 5 o'clock spanning 1.2 cm and 3:30 position spanning 0.7 cm, invasive ductal carcinoma, grade III. Prognostic indicators significant for: estrogen receptor, 10% positive with weak staining intensity and progesterone receptor, 0% negative. Proliferation marker Ki67 at 90%. HER2 amplified with ratios ER2/CEP17 signals 2.33 and average HER2 copies per cell 4.65  The patient's subsequent  history is as detailed below.   PAST MEDICAL HISTORY: Past Medical  History:  Diagnosis Date   Anemia    iron deficient anemia   Breast cancer ([place])    History of Bell's palsy     x 2 as teenager and in 20's   Hypertension    Menorrhagia    Overweight(278.02)    Peripheral edema     PAST SURGICAL HISTORY: Past Surgical History:  Procedure Laterality Date   BREAST BIOPSY Right 02/01/2015   benign   BREAST LUMPECTOMY Left 2019   BREAST LUMPECTOMY WITH RADIOACTIVE SEED AND SENTINEL LYMPH NODE BIOPSY Left 06/16/2018   Procedure: LEFT BREAST LUMPECTOMY WITH RADIOACTIVE SEED X'S 2 WITH SEED TARGETED LYMPH NODE EXCISION AND SENTINEL LYMPH NODE BIOPSY;  Surgeon: [name], MD;  Location: [facility];  Service: General;  Laterality: Left;   CESAREAN SECTION CLASSICAL     CHOLECYSTECTOMY     COSMETIC SURGERY     PORT-A-CATH REMOVAL Right 10/30/2018   Procedure: REMOVAL PORT-A-CATH;  Surgeon: [name], MD;  Location: [facility];  Service: General;  Laterality: Right;   PORTACATH PLACEMENT Right 01/17/2018   Procedure: INSERTION PORT-A-CATH;  Surgeon: [name], MD;  Location: [facility];  Service: General;  Laterality: Right;   TUBAL LIGATION      FAMILY HISTORY Family History  Problem Relation Age of Onset   Hypertension Mother    Diabetes Mother    Stroke Mother    Alcohol abuse Father    Cancer Father        lung ca   Hypertension Father    Hypertension Sister    Colon cancer Neg Hx    Colon polyps Neg Hx    Rectal cancer Neg Hx    Stomach cancer Neg Hx    The patient's father was diagnosed with lung cancer at age 6 and died the same year. The patient's mother died at age 54 due to several strokes. The patient had 1 brother who died due to strokes and possibly a MI. The patient has 1 sister. She denies a history of breast or ovarian cancer in the family.    GYNECOLOGIC HISTORY:  Patient's last menstrual period was 06/21/2008 (approximate). Menarche: 53  years old Age at first live birth: 53 years old The patient is GXP2. The patient is not having periods, with her LMP being in 2010. She used oral contraceptive for about 10 years with no complications. She never used HRT.    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 Her husband, [name], works part time in Therapist, art.  The patient's daughter [name] age 37, is a Ship broker and starting a job. The patient's daughter [name] age 72, is a Ship broker.  Both of the patient's daughters live with her.      ADVANCED DIRECTIVES:    HEALTH MAINTENANCE: Social History   Tobacco Use   Smoking status: Never Smoker   Smokeless tobacco: Never Used  Substance Use Topics   Alcohol use: No    Alcohol/week: 0.0 standard drinks   Drug use: No     Colonoscopy: 03/27/2017 polyp removal/ Dr. Deeann Saint  PAP: May 2017 normal  Bone density:   Allergies  Allergen Reactions   Ace Inhibitors Cough   Advil [Ibuprofen]     Hives     Current Outpatient Medications  Medication Sig Dispense Refill   cholecalciferol (VITAMIN D) 1000 units tablet Take 1 tablet (  1,000 Units total) by mouth daily. 100 tablet 4   cyclobenzaprine (FLEXERIL) 10 MG tablet Take 1 tablet (10 mg total) by mouth 3 (three) times daily as needed for muscle spasms. Caution of sedation 30 tablet 0   KLOR-CON M20 20 MEQ tablet TAKE 2 TABLETS (40 MEQ TOTAL) BY MOUTH DAILY. 60 tablet 5   traMADol (ULTRAM) 50 MG tablet Take 1 tablet (50 mg total) by mouth every 8 (eight) hours as needed for moderate pain or severe pain. Caution of sedation 20 tablet 0   triamterene-hydrochlorothiazide (MAXZIDE) 75-50 MG tablet Take 0.5 tablets by mouth daily. 15 tablet 11   valsartan (DIOVAN) 40 MG tablet Take 1 tablet (40 mg total) by mouth daily. 30 tablet 11   No current facility-administered medications for this visit.     OBJECTIVE: Middle-aged African-American woman who appears well  There were no  vitals filed for this visit.   There is no height or weight on file to calculate BMI.   Wt Readings from Last 3 Encounters:  11/18/18 196 lb 2 oz (89 kg)  11/17/18 195 lb (88.5 kg)  10/30/18 192 lb 10.9 oz (87.4 kg)  ECOG FS:1   LAB RESULTS:  CMP     Component Value Date/Time   NA 139 10/28/2018 0936   K 3.5 10/28/2018 0936   CL 103 10/28/2018 0936   CO2 25 10/28/2018 0936   GLUCOSE 148 (H) 10/28/2018 0936   BUN 11 10/28/2018 0936   CREATININE 0.76 10/28/2018 0936   CREATININE 0.87 10/07/2018 0858   CALCIUM 9.0 10/28/2018 0936   PROT 7.5 10/07/2018 0858   ALBUMIN 3.8 10/07/2018 0858   AST 22 10/07/2018 0858   ALT 24 10/07/2018 0858   ALKPHOS 115 10/07/2018 0858   BILITOT 0.6 10/07/2018 0858   GFRNONAA >60 10/28/2018 0936   GFRNONAA >60 10/07/2018 0858   GFRAA >60 10/28/2018 0936   GFRAA >60 10/07/2018 0858    No results found for: TOTALPROTELP, ALBUMINELP, A1GS, A2GS, BETS, BETA2SER, GAMS, MSPIKE, SPEI  No results found for: KPAFRELGTCHN, LAMBDASER, [facility]  Lab Results  Component Value Date   WBC 6.6 10/07/2018   NEUTROABS 4.7 10/07/2018   HGB 13.2 10/07/2018   HCT 40.2 10/07/2018   MCV 91.2 10/07/2018   PLT 207 10/07/2018    '@LASTCHEMISTRY'$ @  No results found for: LABCA2  No components found for: XNTZGY174  No results for input(s): INR in the last 168 hours.  No results found for: LABCA2  No results found for: BSW967  No results found for: RFF638  No results found for: GYK599  No results found for: CA2729  No components found for: HGQUANT  No results found for: CEA1 / No results found for: CEA1   No results found for: AFPTUMOR  No results found for: CHROMOGRNA  No results found for: PSA1  No visits with results within 3 Day(s) from this visit.  Latest known visit with results is:  Admission on 10/30/2018, Discharged on 10/30/2018  Component Date Value Ref Range Status   Sodium 10/28/2018 139  135 - 145 mmol/L Final   Potassium  10/28/2018 3.5  3.5 - 5.1 mmol/L Final   Chloride 10/28/2018 103  98 - 111 mmol/L Final   CO2 10/28/2018 25  22 - 32 mmol/L Final   Glucose, Bld 10/28/2018 148* 70 - 99 mg/dL Final   BUN 10/28/2018 11  6 - 20 mg/dL Final   Creatinine, Ser 10/28/2018 0.76  0.44 - 1.00 mg/dL Final   Calcium 10/28/2018  9.0  8.9 - 10.3 mg/dL Final   GFR calc non Af Amer 10/28/2018 >60  >60 mL/min Final   GFR calc Af Amer 10/28/2018 >60  >60 mL/min Final   Anion gap 10/28/2018 11  5 - 15 Final   Performed at [facility] [address], [place], [place] [address]    (this displays the last labs from the last 3 days)  No results found for: TOTALPROTELP, ALBUMINELP, A1GS, A2GS, BETS, BETA2SER, GAMS, MSPIKE, SPEI (this displays SPEP labs)  No results found for: KPAFRELGTCHN, LAMBDASER, KAPLAMBRATIO (kappa/lambda light chains)  No results found for: HGBA, HGBA2QUANT, HGBFQUANT, HGBSQUAN (Hemoglobinopathy evaluation)   No results found for: LDH  Lab Results  Component Value Date   IRON 104 09/27/2008   IRONPCTSAT 25.1 09/27/2008   (Iron and TIBC)  No results found for: FERRITIN  Urinalysis    Component Value Date/Time   COLORURINE YELLOW 02/15/2018 2014   APPEARANCEUR CLEAR 02/15/2018 2014   LABSPEC 1.011 02/15/2018 2014   PHURINE 7.0 02/15/2018 2014   [place] 02/15/2018 2014   [place] (A) 02/15/2018 2014   [place] NEGATIVE 02/15/2018 2014   [place] NEGATIVE 02/15/2018 2014   PROTEINUR NEGATIVE 02/15/2018 2014   NITRITE NEGATIVE 02/15/2018 2014   LEUKOCYTESUR NEGATIVE 02/15/2018 2014    STUDIES: Mm Diag Breast Tomo Bilateral  Result Date: 01/27/2019 CLINICAL DATA:  History of LEFT breast cancer in 2019 status post lumpectomy and radiation therapy. EXAM: DIGITAL DIAGNOSTIC BILATERAL MAMMOGRAM WITH CAD AND TOMO COMPARISON:  Previous exam(s). ACR Breast Density Category b: There are scattered areas of fibroglandular density. FINDINGS: There are expected  postsurgical and post radiation changes within the LEFT breast. There are no new dominant masses, suspicious calcifications or secondary signs of malignancy within either breast. Mammographic images were processed with CAD. IMPRESSION: Expected postsurgical changes and post radiation changes in the LEFT breast. No evidence of malignancy within either breast. RECOMMENDATION: Bilateral diagnostic mammogram in 1 year. I have discussed the findings and recommendations with the patient. Results were also provided in writing at the conclusion of the visit. If applicable, a reminder letter will be sent to the patient regarding the next appointment. BI-RADS CATEGORY  2: Benign. Electronically Signed   By: Franki Cabot M.D.   On: 01/27/2019 15:59     ELIGIBLE FOR AVAILABLE RESEARCH PROTOCOL: Participating in exact sciences blood draw study   ASSESSMENT: 53 y.o.  Ignacia Palma, [place] woman status post left breast lower outer quadrant biopsy 12/23/2017 for a clinically multifocal T2 N0, stage II invasive ductal carcinoma, grade 3, essentially estrogen receptor and progesterone receptor negative, but HER-2 amplified, with an MIB-1 of 90%.  (a) breast MRI 01/07/2018 shows the 2.6 cm main mass and 4 additional smaller masses spanning 7.1 cm; there was a suspicious left axillary lymph node  (b) left axillary lymph node biopsy 01/14/2018 was benign/discordant (no lymph node tissue)  (1) neoadjuvant chemotherapy consisting of carboplatin and docetaxel every 21 days for 6 cycles started 01/20/2018, completed 05/05/2018, given in conjunction with anti-HER-2 immunotherapy  (2) anti-HER-2 immunotherapy consisting of trastuzumab and Pertuzumab started 01/20/2018, to be continued for 6 months, last dose scheduled for 07/29/2018  (a) pertuzumab discontinued after cycle 2 due to diarrhea  (b) baseline echocardiogram 01/08/2018 shows an ejection fraction in the 60-65% range  (c) echocardiogram on 04/24/2018 shows an ejection  fraction in the 65-70% range  (d) echocardiogram 07/31/2018 showed an ejection fraction in the 55-60% range  (3) left lumpectomy and sentinel lymph node  biopsy 10 07 2019 showed a residual ypT1a ypN0 invasive ductal carcinoma, grade 3, with a repeat prognostic panel now triple negative  (4) adjuvant radiation 07/28/2018 - 09/15/2018  Site/dose: The patient initially received a dose of 50.4 Gy in 28 fractions to the left breast using whole-breast tangent fields. This was delivered using a 3-D conformal technique. The patient then received a boost to the seroma. This delivered an additional 10 Gy in 5 fractions using 6X, 10X photons with a Complex Isodose technique. The total dose was 60.4 Gy.    PLAN: Lindsey Wright is just over 6 months out from definitive surgery for her breast cancer.  We discussed her mammogram results which are very favorable.  She is having some skin changes on the affected breast, including coarsening and darkening of the skin.  She understands this is largely due to radiation and that it will take a long time to resolve.  Sometimes the skin thickening does not resolve and is more of a permanent thing  She has occasional shooting pains.  She understands of these are benign  She will see her gynecologist Dr. Para March in about 6 months.  She will return to see me in a year after her next mammogram  She knows to call for any other issues that may develop before the next visit here.  Syanne Looney, [name], MD  02/09/19 9:13 AM Medical Oncology and Hematology [facility] [address] [place],  [address] Tel. [phone]    Fax. [phone]   I, Wilburn Mylar, am acting as scribe for Dr. [name]. Wealthy Danielski.  I, Lurline Del MD, have reviewed the above documentation for accuracy and completeness, and I agree with the above.

## 2019-02-06 ENCOUNTER — Telehealth: Payer: Self-pay | Admitting: Oncology

## 2019-02-06 NOTE — Telephone Encounter (Signed)
Called patient regarding upcoming [facility] appointment, test run is complete and e-mail has been sent.

## 2019-02-09 ENCOUNTER — Inpatient Hospital Stay: Payer: 59 | Attending: Oncology | Admitting: Oncology

## 2019-02-09 ENCOUNTER — Other Ambulatory Visit: Payer: 59

## 2019-02-09 DIAGNOSIS — Z923 Personal history of irradiation: Secondary | ICD-10-CM

## 2019-02-09 DIAGNOSIS — Z170 Estrogen receptor positive status [ER+]: Secondary | ICD-10-CM

## 2019-02-09 DIAGNOSIS — C50512 Malignant neoplasm of lower-outer quadrant of left female breast: Secondary | ICD-10-CM

## 2019-05-08 ENCOUNTER — Other Ambulatory Visit: Payer: Self-pay | Admitting: Family Medicine

## 2019-06-04 ENCOUNTER — Ambulatory Visit (INDEPENDENT_AMBULATORY_CARE_PROVIDER_SITE_OTHER): Payer: 59

## 2019-06-04 DIAGNOSIS — Z23 Encounter for immunization: Secondary | ICD-10-CM

## 2019-06-07 LAB — HM PAP SMEAR

## 2019-08-07 ENCOUNTER — Other Ambulatory Visit: Payer: Self-pay | Admitting: Family Medicine

## 2019-08-09 ENCOUNTER — Other Ambulatory Visit: Payer: Self-pay | Admitting: Oncology

## 2019-08-29 ENCOUNTER — Other Ambulatory Visit: Payer: Self-pay

## 2019-08-29 MED ORDER — TRIAMTERENE-HCTZ 75-50 MG PO TABS: 0.5000 | ORAL_TABLET | Freq: Every day | ORAL | 0 refills | Status: DC

## 2019-09-17 ENCOUNTER — Encounter (HOSPITAL_COMMUNITY): Payer: Self-pay | Admitting: Emergency Medicine

## 2019-09-17 ENCOUNTER — Other Ambulatory Visit: Payer: Self-pay

## 2019-09-17 ENCOUNTER — Emergency Department (HOSPITAL_COMMUNITY)
Admission: EM | Admit: 2019-09-17 | Discharge: 2019-09-18 | Disposition: A | Discharge status: Home / Self Care | Payer: 59 | Attending: Emergency Medicine | Admitting: Emergency Medicine

## 2019-09-17 ENCOUNTER — Emergency Department (HOSPITAL_COMMUNITY): Payer: 59

## 2019-09-17 DIAGNOSIS — Z79899 Other long term (current) drug therapy: Secondary | ICD-10-CM | POA: Insufficient documentation

## 2019-09-17 DIAGNOSIS — R0789 Other chest pain: Secondary | ICD-10-CM | POA: Insufficient documentation

## 2019-09-17 DIAGNOSIS — I10 Essential (primary) hypertension: Secondary | ICD-10-CM | POA: Diagnosis not present

## 2019-09-17 DIAGNOSIS — Z853 Personal history of malignant neoplasm of breast: Secondary | ICD-10-CM | POA: Diagnosis not present

## 2019-09-17 MED ORDER — SODIUM CHLORIDE 0.9% FLUSH: 3.0000 mL | Freq: Once | INTRAVENOUS | Status: DC

## 2019-09-17 NOTE — ED Triage Notes (Signed)
Patient reports left chest pain/tightness onset 2 days ago , denies SOB , no emesis or diaphoresis . History of left breast CA with lumpectomy.

## 2019-09-18 LAB — CBC
HCT: 41.5 % (ref 36.0–46.0)
Hemoglobin: 13.8 g/dL (ref 12.0–15.0)
MCH: 31.1 pg (ref 26.0–34.0)
MCHC: 33.3 g/dL (ref 30.0–36.0)
MCV: 93.5 fL (ref 80.0–100.0)
Platelets: 237 10*3/uL (ref 150–400)
RBC: 4.44 MIL/uL (ref 3.87–5.11)
RDW: 12.9 % (ref 11.5–15.5)
WBC: 7.4 10*3/uL (ref 4.0–10.5)
nRBC: 0 % (ref 0.0–0.2)

## 2019-09-18 LAB — BASIC METABOLIC PANEL
Anion gap: 9 (ref 5–15)
BUN: 13 mg/dL (ref 6–20)
CO2: 27 mmol/L (ref 22–32)
Calcium: 9.6 mg/dL (ref 8.9–10.3)
Chloride: 100 mmol/L (ref 98–111)
Creatinine, Ser: 0.77 mg/dL (ref 0.44–1.00)
GFR calc Af Amer: >60 mL/min (ref >60)
GFR calc non Af Amer: >60 mL/min (ref >60)
Glucose, Bld: 251 mg/dL — ABNORMAL HIGH (ref 70–99)
Potassium: 3.7 mmol/L (ref 3.5–5.1)
Sodium: 136 mmol/L (ref 135–145)

## 2019-09-18 LAB — I-STAT BETA HCG BLOOD, ED (MC, WL, AP ONLY): I-stat hCG, quantitative: <5 m[IU]/mL (ref <5)

## 2019-09-18 LAB — PROTIME-INR
INR: 1 (ref 0.8–1.2)
Prothrombin Time: 13.2 seconds (ref 11.4–15.2)

## 2019-09-18 LAB — TROPONIN I (HIGH SENSITIVITY)
Troponin I (High Sensitivity): 4 ng/L (ref <18)
Troponin I (High Sensitivity): 5 ng/L (ref <18)

## 2019-09-18 NOTE — ED Provider Notes (Signed)
[facility] EMERGENCY DEPARTMENT Provider Note   CSN: [id number] Arrival date & time: 09/17/19  2240     History Chief Complaint  Patient presents with  . Chest Pain    Lindsey Wright is a 54 y.o. female with a history of with history of anemia, obesity, Bell's palsy, HTN, and left-sided breast cancer s/p chemotherapy, radiation, and left lumpectomy now intervention who presents to the emergency department with a chief complaint of chest pain.  The patient reports that she developed some aching and tightness in her left chest approximately 2 days ago.  Pain has been constant since onset.  It is nonradiating.  She reports that the pain is worse with some positional changes, but no other known aggravating or alleviating factors.  Pain is not pleuritic or exertional.  She denies shortness of breath, cough, fever, chills, dyspepsia, breast pain or swelling, nipple discharge, abdominal pain, nausea, vomiting, diarrhea, lower extremity edema, palpitations.  Denies any new exercises or injuries.  Reports that she initially thought the pain was heartburn and took antacids without improvement in her symptoms.  No other treatment prior to arrival.  No family history of cardiovascular disease.  No known or suspected COVID-19 contacts.  No family history of PE.  The history is provided by the patient. No language interpreter was used.       Past Medical History:  Diagnosis Date  . Anemia    iron deficient anemia  . Breast cancer ([place])   . History of Bell's palsy     x 2 as teenager and in 20's  . Hypertension   . Menorrhagia   . Overweight(278.02)   . Peripheral edema     Patient Active Problem List   Diagnosis Date Noted  . Low back pain 11/17/2018  . Port-A-Cath in place 01/20/2018  . Malignant neoplasm of lower-outer quadrant of 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, estrogen receptor positive ([place]) 12/26/2017  . Colon cancer screening 01/29/2017  . Obesity (BMI 30-39.9)  10/29/2016  . Prediabetes 09/25/2013  . Essential hypertension 08/15/2007    Past Surgical History:  Procedure Laterality Date  . BREAST BIOPSY Right 02/01/2015   benign  . BREAST LUMPECTOMY Left 2019  . BREAST LUMPECTOMY WITH RADIOACTIVE SEED AND SENTINEL LYMPH NODE BIOPSY Left 06/16/2018   Procedure: LEFT BREAST LUMPECTOMY WITH RADIOACTIVE SEED X'S 2 WITH SEED TARGETED LYMPH NODE EXCISION AND SENTINEL LYMPH NODE BIOPSY;  Surgeon: [name], MD;  Location: [facility];  Service: General;  Laterality: Left;  . CESAREAN SECTION CLASSICAL    . CHOLECYSTECTOMY    . COSMETIC SURGERY    . PORT-A-CATH REMOVAL Right 10/30/2018   Procedure: REMOVAL PORT-A-CATH;  Surgeon: [name], MD;  Location: [facility];  Service: General;  Laterality: Right;  . PORTACATH PLACEMENT Right 01/17/2018   Procedure: INSERTION PORT-A-CATH;  Surgeon: [name], MD;  Location: [facility];  Service: General;  Laterality: Right;  . TUBAL LIGATION       OB History   No obstetric history on file.     Family History  Problem Relation Age of Onset  . Hypertension Mother   . Diabetes Mother   . Stroke Mother   . Alcohol abuse Father   . Cancer Father        lung ca  . Hypertension Father   . Hypertension Sister   . Colon cancer Neg Hx   . Colon polyps Neg Hx   . Rectal  cancer Neg Hx   . Stomach cancer Neg Hx     Social History   Tobacco Use  . Smoking status: Never Smoker  . Smokeless tobacco: Never Used  Substance Use Topics  . Alcohol use: No    Alcohol/week: 0.0 standard drinks  . Drug use: No    Home Medications Prior to Admission medications   Medication Sig Start Date End Date Taking? Authorizing Provider  Cholecalciferol (VITAMIN D3) 25 MCG (1000 UT) CAPS TAKE 1 CAPSULE BY MOUTH EVERY DAY 08/10/19   Magrinat, [name], MD  cyclobenzaprine (FLEXERIL) 10 MG tablet Take 1 tablet (10 mg total) by mouth 3 (three) times daily as needed  for muscle spasms. Caution of sedation 11/17/18   Tower, Wynelle Fanny, MD  KLOR-CON M20 20 MEQ tablet TAKE 2 TABLETS BY MOUTH EVERY DAY 08/10/19   Tower, Wynelle Fanny, MD  traMADol (ULTRAM) 50 MG tablet Take 1 tablet (50 mg total) by mouth every 8 (eight) hours as needed for moderate pain or severe pain. Caution of sedation 11/17/18   Tower, Wynelle Fanny, MD  triamterene-hydrochlorothiazide (MAXZIDE) 75-50 MG tablet Take 0.5 tablets by mouth daily. 08/29/19   Tower, Wynelle Fanny, MD  valsartan (DIOVAN) 40 MG tablet Take 1 tablet (40 mg total) by mouth daily. 11/17/18   Tower, Wynelle Fanny, MD  prochlorperazine (COMPAZINE) 10 MG tablet Take 1 tablet (10 mg total) by mouth every 6 (six) hours as needed (Nausea or vomiting). 04/15/18 06/09/18  Magrinat, [name], MD    Allergies    Ace inhibitors and Advil [ibuprofen]  Review of Systems   Review of Systems  Constitutional: Negative for activity change, chills, diaphoresis and fever.  Respiratory: Negative for apnea, cough, choking, shortness of breath and wheezing.   Cardiovascular: Positive for chest pain. Negative for palpitations and leg swelling.  Gastrointestinal: Negative for abdominal pain, anal bleeding, blood in stool, diarrhea, nausea and vomiting.  Genitourinary: Negative for dysuria.  Musculoskeletal: Positive for myalgias. Negative for arthralgias, back pain, gait problem, neck pain and neck stiffness.  Skin: Negative for rash.  Allergic/Immunologic: Negative for immunocompromised state.  Neurological: Negative for dizziness, seizures, syncope, weakness, numbness and headaches.  Psychiatric/Behavioral: Negative for confusion.    Physical Exam Updated Vital Signs BP (!) 141/90   Pulse 71   Temp 97.6 F (36.4 C) (Oral)   Resp 18   LMP 06/21/2008 (Approximate)   SpO2 93%   Physical Exam Vitals and nursing note reviewed.  Constitutional:      General: She is not in acute distress.    Appearance: She is not ill-appearing, toxic-appearing or diaphoretic.      Comments: Well-appearing.  No acute distress.  HENT:     Head: Normocephalic.  Eyes:     Conjunctiva/sclera: Conjunctivae normal.  Cardiovascular:     Rate and Rhythm: Normal rate and regular rhythm.     Pulses: Normal pulses.     Heart sounds: Normal heart sounds. No murmur. No friction rub. No gallop.   Pulmonary:     Effort: Pulmonary effort is normal. No respiratory distress.     Breath sounds: No stridor. No wheezing, rhonchi or rales.     Comments: Reproducible tenderness palpation of the left upper chest wall.  No crepitus or step-offs.  No rashes, erythema, warmth, or edema.  No masses noted.  No breast tenderness noted on the left. Chest:     Chest wall: Tenderness present.  Abdominal:     General: There is no distension.  Palpations: Abdomen is soft. There is no mass.     Tenderness: There is no abdominal tenderness. There is no right CVA tenderness, left CVA tenderness, guarding or rebound.     Hernia: No hernia is present.  Musculoskeletal:     Cervical back: Neck supple.     Right lower leg: No edema.     Left lower leg: No edema.  Skin:    General: Skin is warm.     Findings: No rash.  Neurological:     Mental Status: She is alert.  Psychiatric:        Behavior: Behavior normal.     ED Results / Procedures / Treatments   Labs (all labs ordered are listed, but only abnormal results are displayed) Labs Reviewed  BASIC METABOLIC PANEL - Abnormal; Notable for the following components:      Result Value   Glucose, Bld 251 (*)    All other components within normal limits  CBC  PROTIME-INR  I-STAT BETA HCG BLOOD, ED (MC, [facility], AP ONLY)  TROPONIN I (HIGH SENSITIVITY)  TROPONIN I (HIGH SENSITIVITY)    EKG EKG Interpretation  Date/Time:  Thursday September 17 2019 23:20:47 EST Ventricular Rate:  68 PR Interval:  180 QRS Duration: 104 QT Interval:  396 QTC Calculation: 421 R Axis:   11 Text Interpretation: Normal sinus rhythm Minimal voltage criteria for  LVH, may be normal variant ( Cornell product ) Borderline ECG When compared with ECG of 02/15/2018, No significant change was found Confirmed by Delora Fuel ([id number]) on 09/18/2019 2:16:43 AM   Radiology DG Chest 2 View  Result Date: 09/18/2019 CLINICAL DATA:  Chest pain EXAM: CHEST - 2 VIEW COMPARISON:  01/17/2018 FINDINGS: The heart size and mediastinal contours are within normal limits. Both lungs are clear. The visualized skeletal structures are unremarkable. IMPRESSION: No active cardiopulmonary disease. Electronically Signed   By: Ulyses Jarred M.D.   On: 09/18/2019 00:02    Procedures Procedures (including critical care time)  Medications Ordered in ED Medications  sodium chloride flush (NS) 0.9 % injection 3 mL (has no administration in time range)    ED Course  I have reviewed the triage vital signs and the nursing notes.  Pertinent labs & imaging results that were available during my care of the patient were reviewed by me and considered in my medical decision making (see chart for details).    MDM Rules/Calculators/A&P                      54 year old female with a history of with history of anemia, obesity, Bell's palsy, HTN, and left-sided breast cancer s/p chemotherapy, radiation, and left lumpectomy now intervention who presents to the emergency department with aching and tightness in her left chest for the last few days.  No other associated symptoms.  She initially treated the pain with antacids for dyspepsia with no improvement.  She has had some pain in the area secondary to previous radiation for left-sided breast cancer, but states that that pain is more sharp.  Vital signs are reassuring.  Chest pain would be very atypical for ACS.  EKG unchanged from previous.  Delta troponin is negative.  Hear score is low.  Chest x-ray is unremarkable.  Glucose is elevated to 251, but anion gap and bicarb are normal.  Labs are otherwise reassuring.  Low suspicion for PE, aortic  dissection, tension pneumothorax, or esophageal rupture.  Pain is reproducible on exam to suspect pain related to  previous radiation versus chest wall pain.  She was advised to take OTC analgesia and use warm or cool compresses.  She has good follow-up with OB/GYN, oncology, and primary care.  She was advised to follow-up with symptoms do not improve.  ER return precautions given.  She is hemodynamically stable in no acute distress.  Safe for discharge home with outpatient follow-up as needed.  Final Clinical Impression(s) / ED Diagnoses Final diagnoses:  Chest wall pain    Rx / DC Orders ED Discharge Orders    None       Joanne Gavel, PA-C 09/18/19 [id number]    Veryl Speak, MD 09/22/19 [phone]

## 2019-09-18 NOTE — ED Notes (Signed)
Patient verbalizes understanding of discharge instructions. Opportunity for questioning and answers were provided. Armband removed by staff, pt discharged from ED. Ambulated out to lobby  

## 2019-09-18 NOTE — Discharge Instructions (Signed)
Thank you for allowing me to care for you today in the Emergency Department.   Take 650 mg of Tylenol or 600 mg of ibuprofen with food every 6 hours for pain.  You can alternate between these 2 medications every 3 hours if your pain returns.  For instance, you can take Tylenol at noon, followed by a dose of ibuprofen at 3, followed by second dose of Tylenol and 6.  Try applying a heating pad or an ice pack for 15 to 20 minutes as frequently as needed help with pain.  Try to stretch the muscles of your chest wall as your pain allows.  Your oncologist wanted to see you in June 2021 after you have a repeat mammogram.  Keep your follow-up appoint with primary care.  Return to the emergency department if you develop respiratory distress, if you develop chest pain that is accompanied by high fevers, if you pass out, sweating, new numbness or weakness, or other new, concerning symptoms.

## 2019-09-28 ENCOUNTER — Ambulatory Visit: Payer: 59 | Admitting: Family Medicine

## 2019-09-28 ENCOUNTER — Encounter: Payer: Self-pay | Admitting: Family Medicine

## 2019-09-28 ENCOUNTER — Other Ambulatory Visit: Payer: Self-pay

## 2019-09-28 VITALS — BP 135/85 | HR 76 | Temp 96.9°F | Ht 66.5 in | Wt 194.2 lb

## 2019-09-28 DIAGNOSIS — R7303 Prediabetes: Secondary | ICD-10-CM | POA: Diagnosis not present

## 2019-09-28 DIAGNOSIS — E669 Obesity, unspecified: Secondary | ICD-10-CM | POA: Diagnosis not present

## 2019-09-28 DIAGNOSIS — C50512 Malignant neoplasm of lower-outer quadrant of left female breast: Secondary | ICD-10-CM | POA: Diagnosis not present

## 2019-09-28 DIAGNOSIS — I10 Essential (primary) hypertension: Secondary | ICD-10-CM

## 2019-09-28 DIAGNOSIS — Z170 Estrogen receptor positive status [ER+]: Secondary | ICD-10-CM

## 2019-09-28 LAB — LIPID PANEL
Cholesterol: 151 mg/dL (ref 0–200)
HDL: 47.7 mg/dL (ref >39.00)
LDL Cholesterol: 90 mg/dL (ref 0–99)
NonHDL: 103.5
Total CHOL/HDL Ratio: 3
Triglycerides: 69 mg/dL (ref 0.0–149.0)
VLDL: 13.8 mg/dL (ref 0.0–40.0)

## 2019-09-28 LAB — HEMOGLOBIN A1C: Hgb A1c MFr Bld: 10.5 % — ABNORMAL HIGH (ref 4.6–6.5)

## 2019-09-28 LAB — TSH: TSH: 0.96 u[IU]/mL (ref 0.35–4.50)

## 2019-09-28 MED ORDER — TRIAMTERENE-HCTZ 75-50 MG PO TABS: 0.5000 | ORAL_TABLET | Freq: Every day | ORAL | 0 refills | Status: DC

## 2019-09-28 MED ORDER — VALSARTAN 40 MG PO TABS: 40.0000 mg | ORAL_TABLET | Freq: Every day | ORAL | 11 refills | Status: DC

## 2019-09-28 NOTE — Assessment & Plan Note (Signed)
bp in fair control at this time  BP Readings from Last 1 Encounters:  09/28/19 135/85   No changes needed Most recent labs reviewed  Disc lifstyle change with low sodium diet and exercise  Labs today

## 2019-09-28 NOTE — Patient Instructions (Addendum)
For weight control and diabetes prevention Try to get most of your carbohydrates from produce (with the exception of white potatoes)  Eat less bread/pasta/rice/snack foods/cereals/sweets and other items from the middle of the grocery store (processed carbs)   Start exercising regularly - do different things on different days to prevent injury  Lab today for cholesterol and blood sugar and thyroid   Take care of yourself   Get to your eye specialist for a check up and discuss the watery eye

## 2019-09-28 NOTE — Assessment & Plan Note (Signed)
Doing well with f/u - oncol and gyn  Sent for last pap report as well Mammogram due in May

## 2019-09-28 NOTE — Progress Notes (Signed)
Subjective:    Patient ID: Lindsey Wright, female    DOB: Apr 30, 1966, 54 y.o.   MRN: [id number]  This visit occurred during the SARS-CoV-2 public health emergency.  Safety protocols were in place, including screening questions prior to the visit, additional usage of staff PPE, and extensive cleaning of exam room while observing appropriate contact time as indicated for disinfecting solutions.    HPI Pt presents for f/u of chronic health problems   Has been doing ok overall   Wt Readings from Last 3 Encounters:  09/28/19 194 lb 3 oz (88.1 kg)  11/18/18 196 lb 2 oz (89 kg)  11/17/18 195 lb (88.5 kg)   30.87 kg/m   At end of the month she will retire from her job  Her health will be a new focus for her  She walks now - wants to increase that   She would like to do more exercise  Unsure what she will do then   Diet is ok/ not always perfect  She struggles with sugar cravings (eats fig bars or ginger snaps)  Cooks healthy much of the time   Has done well with her breast cancer f/u  Nl mammogram 01/27/19  No lumps on self exam   Had gyn visit in October -had a pap and another breast exam   She was seen in ED on 1/7 for chest pain  (thought to be muscular) It scared her  She has had radiation tx for breast cancer in the paste   She did have tenderness to palpation of L upper chest wall  Labs : Glucose 251 noted  No significant EKG changes  Troponin negative   DG Chest 2 View  Result Date: 09/18/2019 CLINICAL DATA:  Chest pain EXAM: CHEST - 2 VIEW COMPARISON:  01/17/2018 FINDINGS: The heart size and mediastinal contours are within normal limits. Both lungs are clear. The visualized skeletal structures are unremarkable. IMPRESSION: No active cardiopulmonary disease. Electronically Signed   By: Ulyses Jarred M.D.   On: 09/18/2019 00:02     Hypertension  Took her bp medicine shortly before coming in today  bp is stable today  No cp or palpitations or headaches or  edema  No side effects to medicines  BP Readings from Last 3 Encounters:  09/28/19 (!) 146/78  09/18/19 (!) 141/90  11/18/18 131/78     Taking triam-hct and K and valsartan  Pulse Readings from Last 3 Encounters:  09/28/19 76  09/18/19 71  11/18/18 93      Lab Results  Component Value Date   CREATININE 0.77 09/17/2019   BUN 13 09/17/2019   NA 136 09/17/2019   K 3.7 09/17/2019   CL 100 09/17/2019   CO2 27 09/17/2019   Lab Results  Component Value Date   ALT 24 10/07/2018   AST 22 10/07/2018   ALKPHOS 115 10/07/2018   BILITOT 0.6 10/07/2018   Lab Results  Component Value Date   WBC 7.4 09/17/2019   HGB 13.8 09/17/2019   HCT 41.5 09/17/2019   MCV 93.5 09/17/2019   PLT 237 09/17/2019     Prediabetes Lab Results  Component Value Date   HGBA1C 6.0 11/11/2017  due for labs  Glucose was high at recent ED visit   Patient Active Problem List   Diagnosis Date Noted  . Low back pain 11/17/2018  . Port-A-Cath in place 01/20/2018  . Malignant neoplasm of lower-outer quadrant of 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, estrogen receptor positive ([place]) 12/26/2017  .  Colon cancer screening 01/29/2017  . Obesity (BMI 30-39.9) 10/29/2016  . Prediabetes 09/25/2013  . Essential hypertension 08/15/2007   Past Medical History:  Diagnosis Date  . Anemia    iron deficient anemia  . Breast cancer ([place])   . History of Bell's palsy     x 2 as teenager and in 20's  . Hypertension   . Menorrhagia   . Overweight(278.02)   . Peripheral edema    Past Surgical History:  Procedure Laterality Date  . BREAST BIOPSY Right 02/01/2015   benign  . BREAST LUMPECTOMY Left 2019  . BREAST LUMPECTOMY WITH RADIOACTIVE SEED AND SENTINEL LYMPH NODE BIOPSY Left 06/16/2018   Procedure: LEFT BREAST LUMPECTOMY WITH RADIOACTIVE SEED X'S 2 WITH SEED TARGETED LYMPH NODE EXCISION AND SENTINEL LYMPH NODE BIOPSY;  Surgeon: [name], MD;  Location: [facility];  Service: General;  Laterality:  Left;  . CESAREAN SECTION CLASSICAL    . CHOLECYSTECTOMY    . COSMETIC SURGERY    . PORT-A-CATH REMOVAL Right 10/30/2018   Procedure: REMOVAL PORT-A-CATH;  Surgeon: [name], MD;  Location: [facility];  Service: General;  Laterality: Right;  . PORTACATH PLACEMENT Right 01/17/2018   Procedure: INSERTION PORT-A-CATH;  Surgeon: [name], MD;  Location: [facility];  Service: General;  Laterality: Right;  . TUBAL LIGATION     Social History   Tobacco Use  . Smoking status: Never Smoker  . Smokeless tobacco: Never Used  Substance Use Topics  . Alcohol use: No    Alcohol/week: 0.0 standard drinks  . Drug use: No   Family History  Problem Relation Age of Onset  . Hypertension Mother   . Diabetes Mother   . Stroke Mother   . Alcohol abuse Father   . Cancer Father        lung ca  . Hypertension Father   . Hypertension Sister   . Colon cancer Neg Hx   . Colon polyps Neg Hx   . Rectal cancer Neg Hx   . Stomach cancer Neg Hx    Allergies  Allergen Reactions  . Ace Inhibitors Cough  . Advil [Ibuprofen]     Hives    Current Outpatient Medications on File Prior to Visit  Medication Sig Dispense Refill  . Cholecalciferol (VITAMIN D3) 25 MCG (1000 UT) CAPS TAKE 1 CAPSULE BY MOUTH EVERY DAY 30 capsule 16  . KLOR-CON M20 20 MEQ tablet TAKE 2 TABLETS BY MOUTH EVERY DAY 60 tablet 2  . [DISCONTINUED] prochlorperazine (COMPAZINE) 10 MG tablet Take 1 tablet (10 mg total) by mouth every 6 (six) hours as needed (Nausea or vomiting). 30 tablet 1   No current facility-administered medications on file prior to visit.     Review of Systems  Constitutional: Negative for activity change, appetite change, fatigue, fever and unexpected weight change.  HENT: Negative for congestion, ear pain, rhinorrhea, sinus pressure and sore throat.   Eyes: Negative for pain, redness and visual disturbance.       L eye has been tearing more  No pain /itch or vision  change  Respiratory: Negative for cough, shortness of breath and wheezing.   Cardiovascular: Negative for chest pain and palpitations.  Gastrointestinal: Negative for abdominal pain, blood in stool, constipation and diarrhea.  Endocrine: Negative for polydipsia and polyuria.  Genitourinary: Negative for dysuria, frequency and urgency.  Musculoskeletal: Negative for arthralgias, back pain and myalgias.  Skin: Negative for pallor and rash.  Allergic/Immunologic:  Negative for environmental allergies.  Neurological: Negative for dizziness, syncope and headaches.  Hematological: Negative for adenopathy. Does not bruise/bleed easily.  Psychiatric/Behavioral: Negative for decreased concentration and dysphoric mood. The patient is not nervous/anxious.        Objective:   Physical Exam Constitutional:      General: She is not in acute distress.    Appearance: Normal appearance. She is well-developed. She is obese. She is not ill-appearing or diaphoretic.  HENT:     Head: Normocephalic and atraumatic.  Eyes:     General: No scleral icterus.       Right eye: No discharge.        Left eye: No discharge.     Extraocular Movements: Extraocular movements intact.     Conjunctiva/sclera: Conjunctivae normal.     Pupils: Pupils are equal, round, and reactive to light.     Comments: No acute changes in L eye noted  Neck:     Thyroid: No thyromegaly.     Vascular: No carotid bruit or JVD.  Cardiovascular:     Rate and Rhythm: Normal rate and regular rhythm.     Heart sounds: Normal heart sounds. No gallop.   Pulmonary:     Effort: Pulmonary effort is normal. No respiratory distress.     Breath sounds: Normal breath sounds. No wheezing or rales.  Abdominal:     General: Bowel sounds are normal. There is no distension or abdominal bruit.     Palpations: Abdomen is soft. There is no mass.     Tenderness: There is no abdominal tenderness.     Hernia: No hernia is present.  Musculoskeletal:      Cervical back: Normal range of motion and neck supple. No tenderness.  Lymphadenopathy:     Cervical: No cervical adenopathy.  Skin:    General: Skin is warm and dry.     Coloration: Skin is not pale.     Findings: No erythema or rash.  Neurological:     Mental Status: She is alert.     Cranial Nerves: No cranial nerve deficit.     Coordination: Coordination normal.     Deep Tendon Reflexes: Reflexes are normal and symmetric. Reflexes normal.  Psychiatric:        Mood and Affect: Mood normal.           Assessment & Plan:   Problem List Items Addressed This Visit      Cardiovascular and Mediastinum   Essential hypertension - Primary    bp in fair control at this time  BP Readings from Last 1 Encounters:  09/28/19 135/85   No changes needed Most recent labs reviewed  Disc lifstyle change with low sodium diet and exercise  Labs today       Relevant Medications   triamterene-hydrochlorothiazide (MAXZIDE) 75-50 MG tablet   valsartan (DIOVAN) 40 MG tablet   Other Relevant Orders   Lipid panel   TSH     Other   Prediabetes    A1C today  disc imp of low glycemic diet and wt loss to prevent DM2  Random glucose was high in ER recently      Relevant Orders   Hemoglobin A1c   Obesity (BMI 30-39.9)    Discussed how this problem influences overall health and the risks it imposes  Reviewed plan for weight loss with lower calorie diet (via better food choices and also portion control or program like weight watchers) and exercise building up to or more  than 30 minutes 5 days per week including some aerobic activity         Malignant neoplasm of lower-outer quadrant of 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, estrogen receptor positive ([place])    Doing well with f/u - oncol and gyn  Sent for last pap report as well Mammogram due in May

## 2019-09-28 NOTE — Assessment & Plan Note (Signed)
A1C today  disc imp of low glycemic diet and wt loss to prevent DM2  Random glucose was high in ER recently

## 2019-09-28 NOTE — Assessment & Plan Note (Signed)
Discussed how this problem influences overall health and the risks it imposes  Reviewed plan for weight loss with lower calorie diet (via better food choices and also portion control or program like weight watchers) and exercise building up to or more than 30 minutes 5 days per week including some aerobic activity    

## 2019-09-29 ENCOUNTER — Telehealth: Payer: Self-pay

## 2019-09-29 NOTE — Telephone Encounter (Signed)
Pt left v/m requesting cb about A1C results.

## 2019-09-29 NOTE — Telephone Encounter (Signed)
Addressed through result note

## 2019-10-02 ENCOUNTER — Ambulatory Visit: Payer: 59 | Admitting: Family Medicine

## 2019-10-02 ENCOUNTER — Telehealth: Payer: Self-pay | Admitting: Family Medicine

## 2019-10-02 ENCOUNTER — Other Ambulatory Visit: Payer: Self-pay

## 2019-10-02 ENCOUNTER — Encounter: Payer: Self-pay | Admitting: Family Medicine

## 2019-10-02 VITALS — BP 144/78 | HR 76 | Temp 96.9°F | Ht 66.5 in | Wt 189.6 lb

## 2019-10-02 DIAGNOSIS — E119 Type 2 diabetes mellitus without complications: Secondary | ICD-10-CM | POA: Diagnosis not present

## 2019-10-02 DIAGNOSIS — E1169 Type 2 diabetes mellitus with other specified complication: Secondary | ICD-10-CM

## 2019-10-02 DIAGNOSIS — E785 Hyperlipidemia, unspecified: Secondary | ICD-10-CM

## 2019-10-02 DIAGNOSIS — E669 Obesity, unspecified: Secondary | ICD-10-CM | POA: Diagnosis not present

## 2019-10-02 MED ORDER — METFORMIN HCL 500 MG PO TABS: 500.0000 mg | ORAL_TABLET | Freq: Two times a day (BID) | ORAL | 3 refills | Status: DC

## 2019-10-02 MED ORDER — ONETOUCH VERIO FLEX SYSTEM W/DEVICE KIT: PACK | 0 refills | Status: AC

## 2019-10-02 MED ORDER — ONETOUCH VERIO VI STRP: ORAL_STRIP | 3 refills | Status: DC

## 2019-10-02 MED ORDER — ONETOUCH ULTRASOFT LANCETS MISC: 3 refills | Status: DC

## 2019-10-02 MED ORDER — ROSUVASTATIN CALCIUM 10 MG PO TABS: 10.0000 mg | ORAL_TABLET | Freq: Every day | ORAL | 3 refills | Status: DC

## 2019-10-02 NOTE — Patient Instructions (Addendum)
Call your pharmacy and see what brand of diabetic supplies are covered  Let me know when you have a name and I will get it going  We will eventually set you up with our nurse to teach you how to check your blood glucose   Diet:  Try to get most of your carbohydrates from produce (with the exception of white potatoes)  Eat less bread/pasta/rice/snack foods/cereals/sweets and other items from the middle of the grocery store (processed carbs)  Cut out the cookies and pretzels for now   Our office will call you to set up diabetic teaching   Start metformin 500 mg twice daily - if any side effects like diarrhea let me know   Start crestor 10 mg daily  -if any problems let us know   You will need a yearly eye exam - we will talk about that next time

## 2019-10-02 NOTE — Progress Notes (Signed)
Subjective:    Patient ID: Lindsey Wright, female    DOB: [birth year], 54 y.o.   MRN: [id number]  HPI  Pt presents for f/u of elevated blood sugar  Wt Readings from Last 3 Encounters:  10/02/19 189 lb 9 oz (86 kg)  09/28/19 194 lb 3 oz (88.1 kg)  11/18/18 196 lb 2 oz (89 kg)  she is trying to eat better   30.14 kg/m   Lab Results  Component Value Date   HGBA1C 10.5 (H) 09/28/2019   Unexpectedly high  New DM2 suspected It was 6.0 a year ago   Her mother had diabetes (is deceased)  She had a stroke at 81 y old and found out DM then   Diet-she eats a lot of produce  She drinks a lot of diet Dr Malachi Bonds   (esp since cancer treatment)  She has not had any this week   She eats gingersnaps  No other sweets  Too much bread/pasta  Not a lot of chips  Does eat pretzels    Drinking lots of water  Some green tea   Exercise- she gets 30 minutes per day walking  She can add more than that - after she stops working   No excessive thirst or urination  No numbness or tingling    Is on an ARB  Needs to start a statin   Had diarrhea during chemo-does not have any now   BP Readings from Last 3 Encounters:  10/02/19 (!) 144/78  09/28/19 135/85  09/18/19 (!) 141/90      Cholesterol also up Lab Results  Component Value Date   CHOL 151 09/28/2019   CHOL 121 11/11/2017   CHOL 137 10/29/2016   Lab Results  Component Value Date   HDL 47.70 09/28/2019   HDL 44.20 11/11/2017   HDL 40.30 10/29/2016   Lab Results  Component Value Date   LDLCALC 90 09/28/2019   [place] 60 11/11/2017   [place] 81 10/29/2016   Lab Results  Component Value Date   TRIG 69.0 09/28/2019   TRIG 82.0 11/11/2017   TRIG 76.0 10/29/2016   Lab Results  Component Value Date   CHOLHDL 3 09/28/2019   CHOLHDL 3 11/11/2017   CHOLHDL 3 10/29/2016   No results found for: LDLDIRECT  Has not been on a statin   Patient Active Problem List   Diagnosis Date Noted  . Hyperlipidemia  associated with type 2 diabetes mellitus ([place]) 10/04/2019  . Low back pain 11/17/2018  . Port-A-Cath in place 01/20/2018  . Malignant neoplasm of lower-outer quadrant of 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, estrogen receptor positive ([place]) 12/26/2017  . Colon cancer screening 01/29/2017  . Obesity (BMI 30-39.9) 10/29/2016  . Controlled type 2 diabetes mellitus without complication, without long-term current use of insulin ([place]) 09/25/2013  . Essential hypertension 08/15/2007   Past Medical History:  Diagnosis Date  . Anemia    iron deficient anemia  . Breast cancer ([place])   . History of Bell's palsy     x 2 as teenager and in 20's  . Hypertension   . Menorrhagia   . Overweight(278.02)   . Peripheral edema    Past Surgical History:  Procedure Laterality Date  . BREAST BIOPSY Right 02/01/2015   benign  . BREAST LUMPECTOMY Left 2019  . BREAST LUMPECTOMY WITH RADIOACTIVE SEED AND SENTINEL LYMPH NODE BIOPSY Left 06/16/2018   Procedure: LEFT BREAST LUMPECTOMY WITH RADIOACTIVE SEED X'S 2 WITH SEED TARGETED LYMPH NODE EXCISION AND  SENTINEL LYMPH NODE BIOPSY;  Surgeon: [name], MD;  Location: [facility];  Service: General;  Laterality: Left;  . CESAREAN SECTION CLASSICAL    . CHOLECYSTECTOMY    . COSMETIC SURGERY    . PORT-A-CATH REMOVAL Right 10/30/2018   Procedure: REMOVAL PORT-A-CATH;  Surgeon: [name], MD;  Location: [facility];  Service: General;  Laterality: Right;  . PORTACATH PLACEMENT Right 01/17/2018   Procedure: INSERTION PORT-A-CATH;  Surgeon: [name], MD;  Location: [facility];  Service: General;  Laterality: Right;  . TUBAL LIGATION     Social History   Tobacco Use  . Smoking status: Never Smoker  . Smokeless tobacco: Never Used  Substance Use Topics  . Alcohol use: No    Alcohol/week: 0.0 standard drinks  . Drug use: No   Family History  Problem Relation Age of Onset  . Hypertension Mother   . Diabetes Mother   .  Stroke Mother   . Alcohol abuse Father   . Cancer Father        lung ca  . Hypertension Father   . Hypertension Sister   . Colon cancer Neg Hx   . Colon polyps Neg Hx   . Rectal cancer Neg Hx   . Stomach cancer Neg Hx    Allergies  Allergen Reactions  . Ace Inhibitors Cough  . Advil [Ibuprofen]     Hives    Current Outpatient Medications on File Prior to Visit  Medication Sig Dispense Refill  . Cholecalciferol (VITAMIN D3) 25 MCG (1000 UT) CAPS TAKE 1 CAPSULE BY MOUTH EVERY DAY 30 capsule 16  . KLOR-CON M20 20 MEQ tablet TAKE 2 TABLETS BY MOUTH EVERY DAY 60 tablet 2  . triamterene-hydrochlorothiazide (MAXZIDE) 75-50 MG tablet Take 0.5 tablets by mouth daily. 45 tablet 0  . valsartan (DIOVAN) 40 MG tablet Take 1 tablet (40 mg total) by mouth daily. 30 tablet 11  . [DISCONTINUED] prochlorperazine (COMPAZINE) 10 MG tablet Take 1 tablet (10 mg total) by mouth every 6 (six) hours as needed (Nausea or vomiting). 30 tablet 1   No current facility-administered medications on file prior to visit.     Review of Systems  Constitutional: Negative for activity change, appetite change, fatigue, fever and unexpected weight change.  HENT: Negative for congestion, ear pain, rhinorrhea, sinus pressure and sore throat.   Eyes: Negative for pain, redness and visual disturbance.  Respiratory: Negative for cough, shortness of breath and wheezing.   Cardiovascular: Negative for chest pain and palpitations.  Gastrointestinal: Negative for abdominal pain, blood in stool, constipation and diarrhea.  Endocrine: Negative for polydipsia, polyphagia and polyuria.  Genitourinary: Negative for dysuria, frequency and urgency.  Musculoskeletal: Negative for arthralgias, back pain and myalgias.  Skin: Negative for pallor and rash.  Allergic/Immunologic: Negative for environmental allergies.  Neurological: Negative for dizziness, syncope, numbness and headaches.  Hematological: Negative for adenopathy. Does  not bruise/bleed easily.  Psychiatric/Behavioral: Negative for decreased concentration and dysphoric mood. The patient is not nervous/anxious.        Objective:   Physical Exam Constitutional:      General: She is not in acute distress.    Appearance: Normal appearance. She is obese. She is not ill-appearing.  Eyes:     General:        Right eye: No discharge.        Left eye: No discharge.     Extraocular Movements: Extraocular movements intact.  Conjunctiva/sclera: Conjunctivae normal.     Pupils: Pupils are equal, round, and reactive to light.  Neck:     Vascular: No carotid bruit.  Cardiovascular:     Rate and Rhythm: Normal rate and regular rhythm.     Pulses: Normal pulses.     Heart sounds: Normal heart sounds.  Pulmonary:     Effort: Pulmonary effort is normal. No respiratory distress.     Breath sounds: Normal breath sounds. No wheezing or rales.  Musculoskeletal:     Cervical back: Normal range of motion and neck supple.  Lymphadenopathy:     Cervical: No cervical adenopathy.  Skin:    General: Skin is warm and dry.     Coloration: Skin is not pale.     Findings: No erythema or rash.  Neurological:     Mental Status: She is alert. Mental status is at baseline.     Sensory: No sensory deficit.  Psychiatric:        Mood and Affect: Mood normal.           Assessment & Plan:   Problem List Items Addressed This Visit      Endocrine   Controlled type 2 diabetes mellitus without complication, without long-term current use of insulin ([place]) - Primary    New diagnosis Lab Results  Component Value Date   HGBA1C 10.5 (H) 09/28/2019   Long disc re: diagnosis and plan for tx Handouts given re: diet and exercise  Metformin 500 mg bid px (disc poss side eff like diarrhea) crestor 10 mg for vascular protection  Taking arb for renal protection  Disc foot and eye care as well as imp of general self care  Pt will call ins to see what brand of dm supplies are  covered- will get those px and then schedule nurse visit for teaching (use of glucometer)  Also ref to DM ed F/u 3 mo or earlier if needed      Relevant Medications   rosuvastatin (CRESTOR) 10 MG tablet   metFORMIN (GLUCOPHAGE) 500 MG tablet   Other Relevant Orders   Ambulatory referral to diabetic education   Hyperlipidemia associated with type 2 diabetes mellitus ([place])    LDL of 90 - goal of 70 or below  Disc goals for lipids and reasons to control them Rev last labs with pt Rev low sat fat diet in detail Px crestor 10 mg daily-disc poss side eff like muscle pain  Will re check at f/u       Relevant Medications   rosuvastatin (CRESTOR) 10 MG tablet   metFORMIN (GLUCOPHAGE) 500 MG tablet     Other   Obesity (BMI 30-39.9)    Discussed how this problem influences overall health and the risks it imposes  Reviewed plan for weight loss with lower calorie diet (via better food choices and also portion control or program like weight watchers) and exercise building up to or more than 30 minutes 5 days per week including some aerobic activity         Relevant Medications   metFORMIN (GLUCOPHAGE) 500 MG tablet

## 2019-10-02 NOTE — Telephone Encounter (Signed)
Pt said that the one touch verio is $10  [facility]  I pend Rx due to not knowing the testing frequency Dr. Glori Bickers would like her to do

## 2019-10-02 NOTE — Telephone Encounter (Signed)
I sent the px  Please schedule a nurse visit with Randall An to teach her how to use her equipment if that is ok  Thanks

## 2019-10-02 NOTE — Telephone Encounter (Signed)
Left VM requesting pt to call me back

## 2019-10-02 NOTE — Telephone Encounter (Signed)
Patient is requesting a call back from you  She stated she was advised to call back and speak with you about the diabetic supplies

## 2019-10-02 NOTE — Telephone Encounter (Signed)
Pt notified Rx sent to pharmacy. Pt does want Larene Beach to show her how to use it. Message routed to [facility] so she can call pt and set up a time/date to show her how to use meter

## 2019-10-04 DIAGNOSIS — E1169 Type 2 diabetes mellitus with other specified complication: Secondary | ICD-10-CM | POA: Insufficient documentation

## 2019-10-04 NOTE — Assessment & Plan Note (Signed)
New diagnosis Lab Results  Component Value Date   HGBA1C 10.5 (H) 09/28/2019   Long disc re: diagnosis and plan for tx Handouts given re: diet and exercise  Metformin 500 mg bid px (disc poss side eff like diarrhea) crestor 10 mg for vascular protection  Taking arb for renal protection  Disc foot and eye care as well as imp of general self care  Pt will call ins to see what brand of dm supplies are covered- will get those px and then schedule nurse visit for teaching (use of glucometer)  Also ref to DM ed F/u 3 mo or earlier if needed

## 2019-10-04 NOTE — Assessment & Plan Note (Signed)
Discussed how this problem influences overall health and the risks it imposes  Reviewed plan for weight loss with lower calorie diet (via better food choices and also portion control or program like weight watchers) and exercise building up to or more than 30 minutes 5 days per week including some aerobic activity    

## 2019-10-04 NOTE — Assessment & Plan Note (Signed)
LDL of 90 - goal of 70 or below  Disc goals for lipids and reasons to control them Rev last labs with pt Rev low sat fat diet in detail Px crestor 10 mg daily-disc poss side eff like muscle pain  Will re check at f/u

## 2019-10-05 ENCOUNTER — Telehealth: Payer: Self-pay

## 2019-10-05 NOTE — Telephone Encounter (Signed)
Unable to reach pt by phone.FYI to Dr Glori Bickers and [place] CMA.

## 2019-10-05 NOTE — Telephone Encounter (Signed)
Aware, sounds good. Thanks

## 2019-10-05 NOTE — Telephone Encounter (Signed)
[facility] Night - Client TELEPHONE ADVICE RECORD Access[facility]urse Patient Name: ESSANCE MCSPADDEN Gender: Female DOB: Mar 26, 1966 Age: 54 Y 52 M 11 D Return Phone Number: [phone] (Primary), [phone] (Secondary) Address: City/State/Zip: [place] [address] Client [facility] Night - Client Client Site [place] Physician [place], Roque Lias - MD Contact Type Call Who Is Calling Patient / Member / Family / Caregiver Call Type Triage / Clinical Relationship To Patient Self Return Phone Number [phone] (Primary) Chief Complaint Abdominal Pain Reason for Call Symptomatic / Request for [place] states she started a new medication this morning Metformin and Crestor. Now she is having abd. pain and diarrhea. Translation No Nurse Assessment Nurse: Altamease Oiler, RN, Adriana Date/Time (Eastern Time): 10/03/2019 4:58:08 PM Confirm and document reason for call. If symptomatic, describe symptoms. ---pt states she started medication today. metformin. reports cramping, has had 2 bm first was "soft", 2nd was loose. feels nauseous Has the patient had close contact with a person known or suspected to have the novel coronavirus illness OR traveled / lives in area with major community spread (including international travel) in the last 14 days from the onset of symptoms? * If Asymptomatic, screen for exposure and travel within the last 14 days. ---No Does the patient have any new or worsening symptoms? ---Yes Will a triage be completed? ---Yes Related visit to physician within the last 2 weeks? ---Yes Does the PT have any chronic conditions? (i.e. diabetes, asthma, this includes High risk factors for pregnancy, etc.) ---Yes List chronic conditions. ---htn hld dm Is the patient pregnant or possibly pregnant? (Ask all females between the ages of 36-55) ---No Is this a behavioral health or  substance abuse call? ---No Guidelines Guideline Title Affirmed Question Affirmed Notes Nurse Date/Time (Eastern Time) Diarrhea MILD-MODERATE diarrhea (e.g., 1-6 times / day more than normal) Altamease Oiler, RN, [place] 10/03/2019 5:02:19 PM PLEASE NOTE: All timestamps contained within this report are represented as [place] Standard Time. CONFIDENTIALTY NOTICE: This fax transmission is intended only for the addressee. It contains information that is legally privileged, confidential or otherwise protected from use or disclosure. If you are not the intended recipient, you are strictly prohibited from reviewing, disclosing, copying using or disseminating any of this information or taking any action in reliance on or regarding this information. If you have received this fax in error, please notify us immediately by telephone so that we can arrange for its return to [place]. Phone: [phone], Toll-Free: [phone], Fax: [phone] Page: 2 of 2 Call Id: [id number] [place]. Time Eilene Ghazi Time) Disposition Final User 10/03/2019 5:08:38 PM Home Care Yes Altamease Oiler, RN, Fabio Bering Caller Disagree/Comply Comply Caller Understands Yes PreDisposition Call Doctor Care Advice Given Per Guideline HOME CARE: CALL BACK IF: * Signs of dehydration occur (e.g., no urine over 12 hours, very dry mouth, lightheaded, etc.) * Diarrhea lasts over 7 days * You become worse. CARE ADVICE given per Diarrhea (Adult) guideline. FLUID THERAPY DURING MILD TO MODERATE DIARRHEA: * Drink more fluids, at least 8-10 cups daily. One cup equals 8 oz (240 ml). * WATER: For mild to moderate diarrhea, water is often the best liquid to drink. You should also eat some salty foods (e.g., potato chips, pretzels, saltine crackers). This is important to make sure you are getting enough salt, sugars, and fluids to meet your body's needs.

## 2019-10-05 NOTE — Telephone Encounter (Signed)
Pt said that after having that diarrhea episode on Saturday she hasn't had it anymore. Pt said that she is doing okay for now and her BM is back to normal so she will keep taking meds. Pt advise if sxs return to call back and update [place]  FYI to PCP

## 2019-10-05 NOTE — Telephone Encounter (Signed)
Hold the metformin for a few days and then let us know if the diarrhea persists  We will make further plan from there

## 2019-10-06 NOTE — Telephone Encounter (Addendum)
I did not call but maybe it was Larene Beach B calling to set up DM meter teaching as requested by Dr. Glori Bickers in prev phone note

## 2019-10-06 NOTE — Telephone Encounter (Signed)
Patient called stating that her husband called her and told her that Dr. Marliss Coots office called her today. Advised patient that I don't see a call going out to her today.  Patient requested that a note go back to [place] to see if she had called her today. Patient stated that she did talk with [place] yesterday. Patient stated that if [place] didn't call her she guess if it is important they will call her back.

## 2019-10-06 NOTE — Telephone Encounter (Signed)
Tried to contact pt. No answer and no VM on cell number.

## 2019-10-06 NOTE — Telephone Encounter (Signed)
Pt reports she picked up the meter on Sunday and the pharmacist taught her how to use the meter. She has been testing successfully since then. Advised if she needs anything else to contact this office. Pt verbalized understanding.

## 2019-10-12 ENCOUNTER — Telehealth: Payer: Self-pay | Admitting: *Deleted

## 2019-10-12 MED ORDER — LOSARTAN POTASSIUM 50 MG PO TABS: 50.0000 mg | ORAL_TABLET | Freq: Every day | ORAL | 11 refills | Status: DC

## 2019-10-12 NOTE — Telephone Encounter (Signed)
Pt notified Rx sent to pharmacy

## 2019-10-12 NOTE — Telephone Encounter (Signed)
Patient called back stating that she has checked with [facility] and they do have Losartan and it will only cost her $6. Patient is wanting to know if Dr. Glori Bickers will put her back on Losartan since her insurance will cover that better than the Valsartan. Patient requested a call back.

## 2019-10-12 NOTE — Telephone Encounter (Signed)
Patient called stating that her insurance medication plan has changed and now her Valsartan is a tier 2. Patient stated that she use to be on Losartan and that had to be changed because the pharmacy was having a hard time getting it. Patient stated that when she talked with her insurance company they told her that Losartan is now on her plan as a tier one that will cost only $10. Patient stated when she was taking Losartan she was not having any problems with that medication. Patient is going to check with her pharmacy to see if they now can get the Losartan. Patient stated if not, she is going to call her insurance company and find out what they have on the tier one that will cost her less that the Valsartan. Patient will call back with information.

## 2019-10-12 NOTE — Telephone Encounter (Signed)
I sent in losartan Let me know if any problems

## 2019-10-20 ENCOUNTER — Encounter: Payer: 59 | Attending: Family Medicine | Admitting: Dietician

## 2019-10-20 ENCOUNTER — Encounter: Payer: Self-pay | Admitting: Dietician

## 2019-10-20 ENCOUNTER — Other Ambulatory Visit: Payer: Self-pay

## 2019-10-20 DIAGNOSIS — E119 Type 2 diabetes mellitus without complications: Secondary | ICD-10-CM

## 2019-10-20 NOTE — Progress Notes (Signed)
Patient was seen on 10/20/2019 for the first of a series of three diabetes self-management courses at [facility].  Patient Education Plan per assessed needs and concerns is to attend three course education program for Diabetes Self Management Education.  The following learning objectives were met by the patient during this class:  Describe diabetes, types of diabetes and pathophysiology  State some common risk factors for diabetes  Defines the role of glucose and insulin  Describe the relationship between diabetes and cardiovascular and other risks  State the members of the Healthcare Team  States the rationale for glucose monitoring and when to test  State their individual [place] the importance of logging glucose readings and how to interpret the readings  Identifies A1C target  Explain the correlation between A1c and eAG values  State symptoms and treatment of high blood glucose and low blood glucose  Explain proper technique for glucose testing and identify proper sharps disposal  Handouts given during class include:  How to Thrive:  A Guide for Your Journey with Diabetes by the ADA  Meal Plan Card and carbohydrate content list  Dietary intake form  Low Sodium Flavoring Tips  Types of Fats  Dining Out  Label reading  Snack list  Planning a balanced meal  The diabetes portion plate  Diabetes Resources  A1c to eAG Conversion Chart  Blood Glucose Log  Diabetes Recommended Care Schedule  Support Group  Diabetes Success Plan  Core Class Satisfaction Survey   Follow-Up Plan:  Attend core 2

## 2019-10-27 ENCOUNTER — Other Ambulatory Visit: Payer: Self-pay

## 2019-10-27 ENCOUNTER — Encounter: Payer: 59 | Admitting: Dietician

## 2019-10-27 ENCOUNTER — Encounter: Payer: Self-pay | Admitting: Dietician

## 2019-10-27 DIAGNOSIS — E119 Type 2 diabetes mellitus without complications: Secondary | ICD-10-CM | POA: Diagnosis not present

## 2019-10-27 NOTE — Progress Notes (Signed)
Patient was seen on 10/27/2019 for the second of a series of three diabetes self-management courses at the [facility]. The following learning objectives were met by the patient during this class:   Describe the role of different macronutrients on glucose  Explain how carbohydrates affect blood glucose  State what foods contain the most carbohydrates  Demonstrate carbohydrate counting  Demonstrate how to read Nutrition Facts food label  Describe effects of various fats on heart health  Describe the importance of good nutrition for health and healthy eating strategies  Describe techniques for managing your shopping, cooking and meal planning  List strategies to follow meal plan when dining out  Describe the effects of alcohol on glucose and how to use it safely  Goals:  Follow Diabetes Meal Plan as instructed  Aim to spread carbs evenly throughout the day  Aim for 3 meals per day and snacks as needed Include lean protein foods to meals/snacks  Monitor glucose levels as instructed by your doctor   Follow-Up Plan:  Attend Core 3  Work towards following your personal food plan.

## 2019-11-03 ENCOUNTER — Encounter: Payer: 59 | Admitting: Dietician

## 2019-11-03 ENCOUNTER — Encounter: Payer: Self-pay | Admitting: Dietician

## 2019-11-03 ENCOUNTER — Other Ambulatory Visit: Payer: Self-pay | Admitting: Family Medicine

## 2019-11-03 ENCOUNTER — Other Ambulatory Visit: Payer: Self-pay

## 2019-11-03 DIAGNOSIS — E119 Type 2 diabetes mellitus without complications: Secondary | ICD-10-CM

## 2019-11-03 NOTE — Progress Notes (Signed)
Patient was seen on 11/03/19 for the third of a series of three diabetes self-management courses at the [facility].   Lindsey Wright the amount of activity recommended for healthy living . Describe activities suitable for individual needs . Identify ways to regularly incorporate activity into daily life . Identify barriers to activity and ways to over come these barriers  Identify diabetes medications being personally used and their primary action for lowering glucose and possible side effects . Describe role of stress on blood glucose and develop strategies to address psychosocial issues . Identify diabetes complications and ways to prevent them  Explain how to manage diabetes during illness . Evaluate success in meeting personal goal . Establish 2-3 goals that they will plan to diligently work on  Goals:   I will count my carb choices at most meals and snacks  I will be active more each week  Your patient has identified these potential barriers to change:  Motivation  Your patient has identified their diabetes self-care support plan as  Family Support   Plan:  Attend Support Group as desired

## 2019-12-24 ENCOUNTER — Other Ambulatory Visit: Payer: Self-pay | Admitting: Oncology

## 2019-12-24 DIAGNOSIS — Z1231 Encounter for screening mammogram for malignant neoplasm of breast: Secondary | ICD-10-CM

## 2020-01-01 ENCOUNTER — Other Ambulatory Visit (INDEPENDENT_AMBULATORY_CARE_PROVIDER_SITE_OTHER): Payer: 59

## 2020-01-01 ENCOUNTER — Other Ambulatory Visit: Payer: Self-pay

## 2020-01-01 DIAGNOSIS — E1169 Type 2 diabetes mellitus with other specified complication: Secondary | ICD-10-CM | POA: Diagnosis not present

## 2020-01-01 DIAGNOSIS — E119 Type 2 diabetes mellitus without complications: Secondary | ICD-10-CM | POA: Diagnosis not present

## 2020-01-01 DIAGNOSIS — E785 Hyperlipidemia, unspecified: Secondary | ICD-10-CM

## 2020-01-01 LAB — COMPREHENSIVE METABOLIC PANEL
ALT: 23 U/L (ref 0–35)
AST: 25 U/L (ref 0–37)
Albumin: 4.4 g/dL (ref 3.5–5.2)
Alkaline Phosphatase: 86 U/L (ref 39–117)
BUN: 18 mg/dL (ref 6–23)
CO2: 30 mEq/L (ref 19–32)
Calcium: 9.5 mg/dL (ref 8.4–10.5)
Chloride: 101 mEq/L (ref 96–112)
Creatinine, Ser: 0.9 mg/dL (ref 0.40–1.20)
GFR: 79.14 mL/min (ref >60.00)
Glucose, Bld: 100 mg/dL — ABNORMAL HIGH (ref 70–99)
Potassium: 3.5 mEq/L (ref 3.5–5.1)
Sodium: 141 mEq/L (ref 135–145)
Total Bilirubin: 0.6 mg/dL (ref 0.2–1.2)
Total Protein: 7.4 g/dL (ref 6.0–8.3)

## 2020-01-01 LAB — LIPID PANEL
Cholesterol: 104 mg/dL (ref 0–200)
HDL: 37.5 mg/dL — ABNORMAL LOW (ref >39.00)
LDL Cholesterol: 52 mg/dL (ref 0–99)
NonHDL: 66.07
Total CHOL/HDL Ratio: 3
Triglycerides: 69 mg/dL (ref 0.0–149.0)
VLDL: 13.8 mg/dL (ref 0.0–40.0)

## 2020-01-01 LAB — HEMOGLOBIN A1C: Hgb A1c MFr Bld: 5.9 % (ref 4.6–6.5)

## 2020-01-05 ENCOUNTER — Ambulatory Visit: Payer: 59 | Admitting: Family Medicine

## 2020-01-05 ENCOUNTER — Encounter: Payer: Self-pay | Admitting: Family Medicine

## 2020-01-05 ENCOUNTER — Other Ambulatory Visit: Payer: Self-pay

## 2020-01-05 VITALS — BP 127/67 | HR 62 | Temp 98.1°F | Resp 16 | Ht 67.0 in | Wt 178.5 lb

## 2020-01-05 DIAGNOSIS — I10 Essential (primary) hypertension: Secondary | ICD-10-CM

## 2020-01-05 DIAGNOSIS — E785 Hyperlipidemia, unspecified: Secondary | ICD-10-CM

## 2020-01-05 DIAGNOSIS — E119 Type 2 diabetes mellitus without complications: Secondary | ICD-10-CM | POA: Diagnosis not present

## 2020-01-05 DIAGNOSIS — E1169 Type 2 diabetes mellitus with other specified complication: Secondary | ICD-10-CM | POA: Diagnosis not present

## 2020-01-05 MED ORDER — METFORMIN HCL 500 MG PO TABS: 500.0000 mg | ORAL_TABLET | Freq: Two times a day (BID) | ORAL | 11 refills | Status: DC

## 2020-01-05 MED ORDER — ROSUVASTATIN CALCIUM 10 MG PO TABS: 10.0000 mg | ORAL_TABLET | Freq: Every day | ORAL | 11 refills | Status: DC

## 2020-01-05 NOTE — Progress Notes (Signed)
Subjective:    Patient ID: Lindsey Wright, female    DOB: 06-27-1966, 54 y.o.   MRN: [id number]  This visit occurred during the SARS-CoV-2 public health emergency.  Safety protocols were in place, including screening questions prior to the visit, additional usage of staff PPE, and extensive cleaning of exam room while observing appropriate contact time as indicated for disinfecting solutions.    HPI Pt presents for f/u of chronic health problems  Wt Readings from Last 3 Encounters:  01/05/20 178 lb 8 oz (81 kg)  10/20/19 188 lb (85.3 kg)  10/02/19 189 lb 9 oz (86 kg)  lost wt with better diet/exercise  27.96 kg/m   She had her first covid vaccine   HTN bp is stable today  No cp or palpitations or headaches or edema  No side effects to medicines  BP Readings from Last 3 Encounters:  01/05/20 127/67  10/02/19 (!) 144/78  09/28/19 135/85     Taking losartan and maxzide (also K)   Pulse Readings from Last 3 Encounters:  01/05/20 62  10/02/19 76  09/28/19 76       Lab Results  Component Value Date   CREATININE 0.90 01/01/2020   BUN 18 01/01/2020   NA 141 01/01/2020   K 3.5 01/01/2020   CL 101 01/01/2020   CO2 30 01/01/2020   Lab Results  Component Value Date   ALT 23 01/01/2020   AST 25 01/01/2020   ALKPHOS 86 01/01/2020   BILITOT 0.6 01/01/2020    DM2 Last visit A1C was way up to 10.5  Working very very hard to get it down  Much improved now  Lab Results  Component Value Date   HGBA1C 5.9 01/01/2020  she went to diabetic teaching  (learned a lot)  She stopped diet drinks -that has helped /drinks lots of water   Glucose readings - much better - low 100s/occ 140 post prandial at highest   Lots of exercise working outside  Also jumps rope, and started riding her bike  Daughter helps her out a lot   Taking metformin 500 mg bid -tolerates it well (had nausea just on the first day)  Has her diabetic eye exam scheduled  Taking arb and statin       Hyperlipidemia  Lab Results  Component Value Date   CHOL 104 01/01/2020   CHOL 151 09/28/2019   CHOL 121 11/11/2017   Lab Results  Component Value Date   HDL 37.50 (L) 01/01/2020   HDL 47.70 09/28/2019   HDL 44.20 11/11/2017   Lab Results  Component Value Date   LDLCALC 52 01/01/2020   [place] 90 09/28/2019   LDLCALC 60 11/11/2017   Lab Results  Component Value Date   TRIG 69.0 01/01/2020   TRIG 69.0 09/28/2019   TRIG 82.0 11/11/2017   Lab Results  Component Value Date   CHOLHDL 3 01/01/2020   CHOLHDL 3 09/28/2019   CHOLHDL 3 11/11/2017   No results found for: LDLDIRECT Now taking crestor 10 mg - tolerates well  HDL is down  (she does eat fish)  LDL is much better at 52    Patient Active Problem List   Diagnosis Date Noted  . Hyperlipidemia associated with type 2 diabetes mellitus ([place]) 10/04/2019  . Low back pain 11/17/2018  . Port-A-Cath in place 01/20/2018  . Malignant neoplasm of lower-outer quadrant of 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, estrogen receptor positive ([place]) 12/26/2017  . Colon cancer screening 01/29/2017  . Obesity (  BMI 30-39.9) 10/29/2016  . Controlled type 2 diabetes mellitus without complication, without long-term current use of insulin ([place]) 09/25/2013  . Essential hypertension 08/15/2007   Past Medical History:  Diagnosis Date  . Anemia    iron deficient anemia  . Breast cancer ([place])   . Diabetes mellitus without complication ([place])   . History of Bell's palsy     x 2 as teenager and in 20's  . Hypertension   . Menorrhagia   . Overweight(278.02)   . Peripheral edema    Past Surgical History:  Procedure Laterality Date  . BREAST BIOPSY Right 02/01/2015   benign  . BREAST LUMPECTOMY Left 2019  . BREAST LUMPECTOMY WITH RADIOACTIVE SEED AND SENTINEL LYMPH NODE BIOPSY Left 06/16/2018   Procedure: LEFT BREAST LUMPECTOMY WITH RADIOACTIVE SEED X'S 2 WITH SEED TARGETED LYMPH NODE EXCISION AND SENTINEL LYMPH NODE BIOPSY;  Surgeon: [name], MD;  Location: [facility];  Service: General;  Laterality: Left;  . CESAREAN SECTION CLASSICAL    . CHOLECYSTECTOMY    . COSMETIC SURGERY    . PORT-A-CATH REMOVAL Right 10/30/2018   Procedure: REMOVAL PORT-A-CATH;  Surgeon: [name], MD;  Location: [facility];  Service: General;  Laterality: Right;  . PORTACATH PLACEMENT Right 01/17/2018   Procedure: INSERTION PORT-A-CATH;  Surgeon: [name], MD;  Location: [facility];  Service: General;  Laterality: Right;  . TUBAL LIGATION     Social History   Tobacco Use  . Smoking status: Never Smoker  . Smokeless tobacco: Never Used  Substance Use Topics  . Alcohol use: No    Alcohol/week: 0.0 standard drinks  . Drug use: No   Family History  Problem Relation Age of Onset  . Hypertension Mother   . Diabetes Mother   . Stroke Mother   . Alcohol abuse Father   . Cancer Father        lung ca  . Hypertension Father   . Hypertension Sister   . Colon cancer Neg Hx   . Colon polyps Neg Hx   . Rectal cancer Neg Hx   . Stomach cancer Neg Hx    Allergies  Allergen Reactions  . Ace Inhibitors Cough  . Advil [Ibuprofen]     Hives    Current Outpatient Medications on File Prior to Visit  Medication Sig Dispense Refill  . Blood Glucose Monitoring Suppl ([place] FLEX SYSTEM) w/Device KIT To check glucose twice daily and prn for uncontrolled diabetes type 2 (E11.9) 1 kit 0  . Cholecalciferol (VITAMIN D3) 25 MCG (1000 UT) CAPS TAKE 1 CAPSULE BY MOUTH EVERY DAY 30 capsule 16  . glucose blood (ONETOUCH VERIO) test strip To check glucose twice daily and prn for uncontrolled diabetes type 2 (E11.9) 100 each 3  . KLOR-CON M20 20 MEQ tablet TAKE 2 TABLETS BY MOUTH EVERY DAY 60 tablet 5  . Lancets (ONETOUCH ULTRASOFT) lancets To check glucose twice daily and prn for uncontrolled diabetes 2 (E11.9) 100 each 3  . losartan (COZAAR) 50 MG tablet Take 1 tablet (50 mg total) by mouth daily. 30  tablet 11  . triamterene-hydrochlorothiazide (MAXZIDE) 75-50 MG tablet Take 0.5 tablets by mouth daily. 45 tablet 0  . [DISCONTINUED] prochlorperazine (COMPAZINE) 10 MG tablet Take 1 tablet (10 mg total) by mouth every 6 (six) hours as needed (Nausea or vomiting). 30 tablet 1   No current facility-administered medications on file prior to visit.    Review of  Systems  Constitutional: Negative for activity change, appetite change, fatigue, fever and unexpected weight change.  HENT: Negative for congestion, ear pain, rhinorrhea, sinus pressure and sore throat.   Eyes: Negative for pain, redness and visual disturbance.  Respiratory: Negative for cough, shortness of breath and wheezing.   Cardiovascular: Negative for chest pain and palpitations.  Gastrointestinal: Negative for abdominal pain, blood in stool, constipation and diarrhea.  Endocrine: Negative for polydipsia, polyphagia and polyuria.  Genitourinary: Negative for dysuria, frequency and urgency.  Musculoskeletal: Negative for arthralgias, back pain and myalgias.  Skin: Negative for pallor and rash.  Allergic/Immunologic: Negative for environmental allergies.  Neurological: Negative for dizziness, syncope and headaches.  Hematological: Negative for adenopathy. Does not bruise/bleed easily.  Psychiatric/Behavioral: Negative for decreased concentration and dysphoric mood. The patient is not nervous/anxious.        Objective:   Physical Exam Constitutional:      General: She is not in acute distress.    Appearance: Normal appearance. She is well-developed and normal weight. She is not ill-appearing or diaphoretic.  HENT:     Head: Normocephalic and atraumatic.  Eyes:     General: No scleral icterus.    Conjunctiva/sclera: Conjunctivae normal.     Pupils: Pupils are equal, round, and reactive to light.  Neck:     Thyroid: No thyromegaly.     Vascular: No carotid bruit or JVD.  Cardiovascular:     Rate and Rhythm: Normal rate  and regular rhythm.     Heart sounds: Normal heart sounds. No gallop.   Pulmonary:     Effort: Pulmonary effort is normal. No respiratory distress.     Breath sounds: Normal breath sounds. No wheezing or rales.  Abdominal:     General: Bowel sounds are normal. There is no distension or abdominal bruit.     Palpations: Abdomen is soft. There is no mass.     Tenderness: There is no abdominal tenderness.  Musculoskeletal:     Cervical back: Normal range of motion and neck supple.  Lymphadenopathy:     Cervical: No cervical adenopathy.  Skin:    General: Skin is warm and dry.     Findings: No erythema or rash.  Neurological:     Mental Status: She is alert. Mental status is at baseline.     Sensory: No sensory deficit.     Coordination: Coordination normal.     Deep Tendon Reflexes: Reflexes are normal and symmetric. Reflexes normal.  Psychiatric:        Mood and Affect: Mood normal.           Assessment & Plan:   Problem List Items Addressed This Visit      Cardiovascular and Mediastinum   Essential hypertension    bp in fair control at this time  BP Readings from Last 1 Encounters:  01/05/20 127/67   No changes needed Most recent labs reviewed  Disc lifstyle change with low sodium diet and exercise        Relevant Medications   rosuvastatin (CRESTOR) 10 MG tablet     Endocrine   Controlled type 2 diabetes mellitus without complication, without long-term current use of insulin ([place]) - Primary    Lab Results  Component Value Date   HGBA1C 5.9 01/01/2020   Much improvement from A1C formerly over 10  Doing very well with diet/wt loss Has gone to diabetic ed classes  bp at goal  Tolerating metformin  On arb and statin  Urged to keep  up the good work  Dm eye exam is scheduled  F/u planned in 6 mo       Relevant Medications   metFORMIN (GLUCOPHAGE) 500 MG tablet   rosuvastatin (CRESTOR) 10 MG tablet   Hyperlipidemia associated with type 2 diabetes mellitus  (H[facility])    Disc goals for lipids and reasons to control them Rev last labs with pt LDL of 52 at goal  Tolerating crestor 10 mg well   Rev low sat fat diet in detail  Disc omega 3 intake and exercise for HDL       Relevant Medications   metFORMIN (GLUCOPHAGE) 500 MG tablet   rosuvastatin (CRESTOR) 10 MG tablet

## 2020-01-05 NOTE — Assessment & Plan Note (Signed)
Disc goals for lipids and reasons to control them Rev last labs with pt LDL of 52 at goal  Tolerating crestor 10 mg well   Rev low sat fat diet in detail  Disc omega 3 intake and exercise for HDL

## 2020-01-05 NOTE — Patient Instructions (Addendum)
Your HDL is a little low  Keep eating fish  Also exercise helps  Your LDL is much better   Get your eye exam as planned   Get your mammogram as planned   Keep up the great work with healthy diet and exercise  You can test glucose 3-5 times per week (whenever you want) since you are doing so  Some am (fasting) and some 2 hours after you eat

## 2020-01-05 NOTE — Assessment & Plan Note (Signed)
Lab Results  Component Value Date   HGBA1C 5.9 01/01/2020   Much improvement from A1C formerly over 10  Doing very well with diet/wt loss Has gone to diabetic ed classes  bp at goal  Tolerating metformin  On arb and statin  Urged to keep up the good work  Dm eye exam is scheduled  F/u planned in 6 mo

## 2020-01-05 NOTE — Assessment & Plan Note (Signed)
bp in fair control at this time  BP Readings from Last 1 Encounters:  01/05/20 127/67   No changes needed Most recent labs reviewed  Disc lifstyle change with low sodium diet and exercise

## 2020-02-02 LAB — HM DIABETES EYE EXAM

## 2020-02-05 ENCOUNTER — Encounter: Payer: Self-pay | Admitting: Family Medicine

## 2020-02-08 NOTE — Progress Notes (Signed)
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 She continues under observation.  Since her last visit, she presented to the ED on 09/17/2019 with chest pain.  Troponin x2 was normal.  Chest x-ray performed at that time was negative.  Also since the last visit here she was found to be diabetic with a hemoglobin A1c of 10.  She started an exercise program, jumping rope, walking 30 minutes daily, using a stationary bike, and she is also eating a little bit more healthily.  She has brought her hemoglobin A1c below 6, which I think is a remarkable achievement.  She is taking Metformin 500 mg twice daily with no side effects that she is aware of  She is scheduled for mammography on 02/23/2020.  This had to be postponed because of the [place] vaccine which she received without complications    REVIEW OF SYSTEMS: A detailed review of systems today was otherwise noncontributory  HISTORY OF CURRENT ILLNESS: From the original intake note:  "Lindsey Wright" palpated a mass in the left breast about 2 weeks ago. She followed up with her gynecologist, Lindsey Wright who referred her to the [facility] for mammography. She underwent unilateral left diagnostic mammography with tomography and left breast ultrasonography at [facility] on 12/20/2017 showing: breast density category B. Suspicious  newly palpable left breast mass at the 5 o'clock lower outer position with internal blow flow measuring 2.3 x 1.8 x 2.5 cm. There are either 2 adjacent masses or a single complicated mass at the 1:27 lower outer position, 4 cm from the nipple measuring 1.8 x 0.5 by 0.9 cm which may represent fibrocystic changes versus a solid mass. No other suspicious findings. Ultrasound showed normal axillary nodes.  Accordingly on 12/23/2017 she proceeded to biopsy of the left breast areas in question. The pathology from this procedure showed ([id number]): At both the 5 o'clock spanning 1.2 cm and 3:30 position spanning 0.7 cm, invasive ductal carcinoma, grade III. Prognostic indicators significant for: estrogen receptor, 10% positive with weak staining intensity and progesterone receptor, 0% negative. Proliferation marker Ki67 at 90%. HER2 amplified with ratios ER2/CEP17 signals 2.33 and average HER2 copies per cell 4.65  The patient's subsequent history is as detailed below.   PAST MEDICAL HISTORY: Past Medical History:  Diagnosis Date  . Anemia    iron deficient anemia  . Breast cancer (Lindsey Wright)   . Diabetes mellitus without complication ([place])   . History of Bell's palsy     x 2 as teenager and in 20's  . Hypertension   . Menorrhagia   . Overweight(278.02)   . Peripheral edema     PAST SURGICAL HISTORY: Past Surgical History:  Procedure Laterality Date  . BREAST BIOPSY Right 02/01/2015   benign  . BREAST LUMPECTOMY Left 2019  . BREAST LUMPECTOMY WITH RADIOACTIVE SEED AND SENTINEL LYMPH NODE BIOPSY Left 06/16/2018   Procedure: LEFT BREAST LUMPECTOMY WITH RADIOACTIVE SEED X'S 2 WITH SEED TARGETED LYMPH  NODE EXCISION AND SENTINEL LYMPH NODE BIOPSY;  Surgeon: Lindsey Kussmaul, MD;  Location: [facility];  Service: General;  Laterality: Left;  . CESAREAN SECTION CLASSICAL    . CHOLECYSTECTOMY    . COSMETIC SURGERY    . PORT-A-CATH REMOVAL Right 10/30/2018   Procedure: REMOVAL PORT-A-CATH;   Surgeon: Lindsey Kussmaul, MD;  Location: [facility];  Service: General;  Laterality: Right;  . PORTACATH PLACEMENT Right 01/17/2018   Procedure: INSERTION PORT-A-CATH;  Surgeon: Lindsey Kussmaul, MD;  Location: [facility];  Service: General;  Laterality: Right;  . TUBAL LIGATION      FAMILY HISTORY Family History  Problem Relation Age of Onset  . Hypertension Mother   . Diabetes Mother   . Stroke Mother   . Alcohol abuse Father   . Cancer Father        lung ca  . Hypertension Father   . Hypertension Sister   . Colon cancer Neg Hx   . Colon polyps Neg Hx   . Rectal cancer Neg Hx   . Stomach cancer Neg Hx    The patient's father was diagnosed with lung cancer at age 76 and died the same year. The patient's mother died at age 47 due to several strokes. The patient had 1 brother who died due to strokes and possibly a MI. The patient has 1 sister. She denies a history of breast or ovarian cancer in the family.    GYNECOLOGIC HISTORY:  Patient's last menstrual period was 06/21/2008 (approximate). Menarche: 54 years old Age at first live birth: 54 years old The patient is GXP2. The patient is not having periods, with her LMP being in 2010. She used oral contraceptive for about 10 years with no complications. She never used HRT.    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 Her husband, Lindsey Wright, works part time in Therapist, art.  The patient's daughter Lindsey Wright age 22, is a Ship broker and starting a job. The patient's daughter Lindsey Wright age 89, is a Ship broker.  Both of the patient's daughters live with her.      ADVANCED DIRECTIVES:    HEALTH MAINTENANCE: Social History   Tobacco Use  . Smoking status: Never Smoker  . Smokeless tobacco: Never Used  Substance Use Topics  . Alcohol use: No    Alcohol/week: 0.0 standard drinks  . Drug use: No     Colonoscopy: 03/27/2017 polyp removal/ Dr. Deeann Wright  PAP: May 2017  normal  Bone density:   Allergies  Allergen Reactions  . Ace Inhibitors Cough  . Advil [Ibuprofen]     Hives     Current Outpatient Medications  Medication Sig Dispense Refill  . Blood Glucose Monitoring Suppl ([place] FLEX SYSTEM) w/Device KIT To check glucose twice daily and prn for uncontrolled diabetes type 2 (E11.9) 1 kit 0  . Cholecalciferol (VITAMIN D3) 25 MCG (1000 UT) CAPS TAKE 1 CAPSULE BY MOUTH EVERY DAY 30 capsule 16  . glucose blood (ONETOUCH VERIO) test strip To check glucose twice daily and prn for uncontrolled diabetes type 2 (E11.9) 100 each 3  . KLOR-CON M20 20 MEQ tablet TAKE 2 TABLETS BY MOUTH EVERY DAY 60 tablet 5  . Lancets (ONETOUCH ULTRASOFT) lancets To check glucose twice daily and prn for uncontrolled diabetes 2 (E11.9) 100 each 3  . losartan (COZAAR) 50 MG tablet Take 1 tablet (50 mg total) by mouth daily. 30 tablet 11  . metFORMIN (GLUCOPHAGE)  500 MG tablet Take 1 tablet (500 mg total) by mouth 2 (two) times daily with a meal. 60 tablet 11  . rosuvastatin (CRESTOR) 10 MG tablet Take 1 tablet (10 mg total) by mouth daily. 30 tablet 11  . triamterene-hydrochlorothiazide (MAXZIDE) 75-50 MG tablet Take 0.5 tablets by mouth daily. 45 tablet 0   No current facility-administered medications for this visit.    OBJECTIVE: African-American woman in no acute distress  Vitals:   02/09/20 1505  BP: 137/77  Pulse: 65  Resp: 18  Temp: 98.3 F (36.8 C)  SpO2: 100%     Body mass index is 27.49 kg/m.   Wt Readings from Last 3 Encounters:  02/09/20 175 lb 8 oz (79.6 kg)  01/05/20 178 lb 8 oz (81 kg)  10/20/19 188 lb (85.3 kg)  ECOG FS:1  Sclerae unicteric, EOMs intact Wearing a mask No cervical or supraclavicular adenopathy Lungs no rales or rhonchi Heart regular rate and rhythm Abd soft, nontender, positive bowel sounds MSK no focal spinal tenderness, no upper extremity lymphedema Neuro: nonfocal, well oriented, appropriate affect Breasts: The  right breast is benign.  The left breast is status post lumpectomy and radiation.  There is still some hyperpigmentation and some coarsening of the skin as expected.  Left axilla is benign.   LAB RESULTS:  CMP     Component Value Date/Time   NA 141 01/01/2020 0853   K 3.5 01/01/2020 0853   CL 101 01/01/2020 0853   CO2 30 01/01/2020 0853   GLUCOSE 100 (H) 01/01/2020 0853   BUN 18 01/01/2020 0853   CREATININE 0.90 01/01/2020 0853   CREATININE 0.87 10/07/2018 0858   CALCIUM 9.5 01/01/2020 0853   PROT 7.4 01/01/2020 0853   ALBUMIN 4.4 01/01/2020 0853   AST 25 01/01/2020 0853   AST 22 10/07/2018 0858   ALT 23 01/01/2020 0853   ALT 24 10/07/2018 0858   ALKPHOS 86 01/01/2020 0853   BILITOT 0.6 01/01/2020 0853   BILITOT 0.6 10/07/2018 0858   GFRNONAA >60 09/17/2019 2343   GFRNONAA >60 10/07/2018 0858   GFRAA >60 09/17/2019 2343   GFRAA >60 10/07/2018 0858    No results found for: TOTALPROTELP, ALBUMINELP, A1GS, A2GS, BETS, BETA2SER, GAMS, MSPIKE, SPEI  No results found for: KPAFRELGTCHN, LAMBDASER, KAPLAMBRATIO  Lab Results  Component Value Date   WBC 8.3 02/09/2020   NEUTROABS 5.1 02/09/2020   HGB 13.7 02/09/2020   HCT 41.5 02/09/2020   MCV 93.9 02/09/2020   PLT 213 02/09/2020   No results found for: LABCA2  No components found for: FUXNAT557  No results for input(s): INR in the last 168 hours.  No results found for: LABCA2  No results found for: DUK025  No results found for: KYH062  No results found for: BJS283  No results found for: CA2729  No components found for: HGQUANT  No results found for: CEA1 / No results found for: CEA1   No results found for: AFPTUMOR  No results found for: CHROMOGRNA  No results found for: HGBA, HGBA2QUANT, HGBFQUANT, HGBSQUAN (Hemoglobinopathy evaluation)   No results found for: LDH  Lab Results  Component Value Date   IRON 104 09/27/2008   IRONPCTSAT 25.1 09/27/2008   (Iron and TIBC)  No results found for:  FERRITIN  Urinalysis    Component Value Date/Time   COLORURINE YELLOW 02/15/2018 2014   APPEARANCEUR CLEAR 02/15/2018 2014   LABSPEC 1.011 02/15/2018 2014   PHURINE 7.0 02/15/2018 2014   [place] NEGATIVE 02/15/2018 2014  HGBUR SMALL (A) 02/15/2018 2014   BILIRUBINUR NEGATIVE 02/15/2018 2014   [place] NEGATIVE 02/15/2018 2014   PROTEINUR NEGATIVE 02/15/2018 2014   NITRITE NEGATIVE 02/15/2018 2014   LEUKOCYTESUR NEGATIVE 02/15/2018 2014    STUDIES: No results found.   ELIGIBLE FOR AVAILABLE RESEARCH PROTOCOL: Participating in exact sciences blood draw study   ASSESSMENT: 54 y.o.  Ignacia Palma, [place] woman status post left breast lower outer quadrant biopsy 12/23/2017 for a clinically multifocal T2 N0, stage II invasive ductal carcinoma, grade 3, essentially estrogen receptor and progesterone receptor negative, but HER-2 amplified, with an MIB-1 of 90%.  (a) breast MRI 01/07/2018 shows the 2.6 cm main mass and 4 additional smaller masses spanning 7.1 cm; there was a suspicious left axillary lymph node  (b) left axillary lymph node biopsy 01/14/2018 was benign/discordant (no lymph node tissue)  (1) neoadjuvant chemotherapy consisting of carboplatin and docetaxel every 21 days for 6 cycles started 01/20/2018, completed 05/05/2018, given in conjunction with anti-HER-2 immunotherapy  (2) anti-HER-2 immunotherapy consisting of trastuzumab and Pertuzumab started 01/20/2018, to be continued for 6 months, last dose scheduled for 07/29/2018  (a) pertuzumab discontinued after cycle 2 due to diarrhea  (b) baseline echocardiogram 01/08/2018 shows an ejection fraction in the 60-65% range  (c) echocardiogram on 04/24/2018 shows an ejection fraction in the 65-70% range  (d) echocardiogram 07/31/2018 showed an ejection fraction in the 55-60% range  (3) left lumpectomy and sentinel lymph node biopsy 10 07 2019 showed a residual ypT1a ypN0 invasive ductal carcinoma, grade 3, with a repeat prognostic  panel now triple negative  (4) adjuvant radiation 07/28/2018 - 09/15/2018  Site/dose: The patient initially received a dose of 50.4 Gy in 28 fractions to the left breast using whole-breast tangent fields. This was delivered using a 3-D conformal technique. The patient then received a boost to the seroma. This delivered an additional 10 Gy in 5 fractions using 6X, 10X photons with a Complex Isodose technique. The total dose was 60.4 Gy.    PLAN: Lindsey Wright is now a year and a half out from definitive surgery for breast cancer with no evidence of disease recurrence.  This is very favorable.  I highly commended her exercise and diet and the excellent results she is obtaining as far as her diabetes is concerned  She will see me again in a year and a month so that next year the visit will be after her mammography instead of before  She knows to call for any other issue that may develop before then.  Total encounter time 25 minutes.*  Chawn Spraggins, Lindsey Dad, MD  02/09/20 3:28 PM Medical Oncology and Hematology [facility] [place], [place] [address] Tel. [phone]    Fax. [phone]   I, Wilburn Mylar, am acting as scribe for Dr. Virgie Wright. Maryama Kuriakose.  I, Lurline Del MD, have reviewed the above documentation for accuracy and completeness, and I agree with the above.   *Total Encounter Time as defined by the Centers for Medicare and Medicaid Services includes, in addition to the face-to-face time of a patient visit (documented in the note above) non-face-to-face time: obtaining and reviewing outside history, ordering and reviewing medications, tests or procedures, care coordination (communications with other health care professionals or caregivers) and documentation in the medical record.

## 2020-02-09 ENCOUNTER — Inpatient Hospital Stay: Payer: 59 | Attending: Oncology | Admitting: Oncology

## 2020-02-09 ENCOUNTER — Other Ambulatory Visit: Payer: Self-pay

## 2020-02-09 ENCOUNTER — Inpatient Hospital Stay: Payer: 59

## 2020-02-09 VITALS — BP 137/77 | HR 65 | Temp 98.3°F | Resp 18 | Ht 67.0 in | Wt 175.5 lb

## 2020-02-09 DIAGNOSIS — N920 Excessive and frequent menstruation with regular cycle: Secondary | ICD-10-CM | POA: Diagnosis not present

## 2020-02-09 DIAGNOSIS — Z79899 Other long term (current) drug therapy: Secondary | ICD-10-CM | POA: Insufficient documentation

## 2020-02-09 DIAGNOSIS — D509 Iron deficiency anemia, unspecified: Secondary | ICD-10-CM | POA: Diagnosis not present

## 2020-02-09 DIAGNOSIS — I10 Essential (primary) hypertension: Secondary | ICD-10-CM | POA: Diagnosis not present

## 2020-02-09 DIAGNOSIS — Z853 Personal history of malignant neoplasm of breast: Secondary | ICD-10-CM | POA: Diagnosis present

## 2020-02-09 DIAGNOSIS — Z170 Estrogen receptor positive status [ER+]: Secondary | ICD-10-CM

## 2020-02-09 DIAGNOSIS — E119 Type 2 diabetes mellitus without complications: Secondary | ICD-10-CM

## 2020-02-09 DIAGNOSIS — C50512 Malignant neoplasm of lower-outer quadrant of left female breast: Secondary | ICD-10-CM

## 2020-02-09 LAB — COMPREHENSIVE METABOLIC PANEL
ALT: 25 U/L (ref 0–44)
AST: 24 U/L (ref 15–41)
Albumin: 4.1 g/dL (ref 3.5–5.0)
Alkaline Phosphatase: 84 U/L (ref 38–126)
Anion gap: 10 (ref 5–15)
BUN: 22 mg/dL — ABNORMAL HIGH (ref 6–20)
CO2: 27 mmol/L (ref 22–32)
Calcium: 10.2 mg/dL (ref 8.9–10.3)
Chloride: 103 mmol/L (ref 98–111)
Creatinine, Ser: 0.91 mg/dL (ref 0.44–1.00)
GFR calc Af Amer: >60 mL/min (ref >60)
GFR calc non Af Amer: >60 mL/min (ref >60)
Glucose, Bld: 87 mg/dL (ref 70–99)
Potassium: 3.8 mmol/L (ref 3.5–5.1)
Sodium: 140 mmol/L (ref 135–145)
Total Bilirubin: 0.4 mg/dL (ref 0.3–1.2)
Total Protein: 8.2 g/dL — ABNORMAL HIGH (ref 6.5–8.1)

## 2020-02-09 LAB — CBC WITH DIFFERENTIAL/PLATELET
Abs Immature Granulocytes: 0.01 10*3/uL (ref 0.00–0.07)
Basophils Absolute: 0.1 10*3/uL (ref 0.0–0.1)
Basophils Relative: 1 %
Eosinophils Absolute: 0.2 10*3/uL (ref 0.0–0.5)
Eosinophils Relative: 3 %
HCT: 41.5 % (ref 36.0–46.0)
Hemoglobin: 13.7 g/dL (ref 12.0–15.0)
Immature Granulocytes: 0 %
Lymphocytes Relative: 28 %
Lymphs Abs: 2.4 10*3/uL (ref 0.7–4.0)
MCH: 31 pg (ref 26.0–34.0)
MCHC: 33 g/dL (ref 30.0–36.0)
MCV: 93.9 fL (ref 80.0–100.0)
Monocytes Absolute: 0.6 10*3/uL (ref 0.1–1.0)
Monocytes Relative: 7 %
Neutro Abs: 5.1 10*3/uL (ref 1.7–7.7)
Neutrophils Relative %: 61 %
Platelets: 213 10*3/uL (ref 150–400)
RBC: 4.42 MIL/uL (ref 3.87–5.11)
RDW: 12.8 % (ref 11.5–15.5)
WBC: 8.3 10*3/uL (ref 4.0–10.5)
nRBC: 0 % (ref 0.0–0.2)

## 2020-02-19 ENCOUNTER — Other Ambulatory Visit: Payer: Self-pay | Admitting: Oncology

## 2020-02-19 DIAGNOSIS — Z853 Personal history of malignant neoplasm of breast: Secondary | ICD-10-CM

## 2020-02-23 ENCOUNTER — Other Ambulatory Visit: Payer: Self-pay | Admitting: Oncology

## 2020-02-23 ENCOUNTER — Ambulatory Visit
Admission: RE | Admit: 2020-02-23 | Discharge: 2020-02-23 | Disposition: A | Discharge status: Home / Self Care | Payer: 59 | Source: Ambulatory Visit | Attending: Oncology | Admitting: Oncology

## 2020-02-23 ENCOUNTER — Other Ambulatory Visit: Payer: Self-pay

## 2020-02-23 DIAGNOSIS — Z853 Personal history of malignant neoplasm of breast: Secondary | ICD-10-CM

## 2020-02-23 DIAGNOSIS — N6320 Unspecified lump in the left breast, unspecified quadrant: Secondary | ICD-10-CM

## 2020-02-26 ENCOUNTER — Other Ambulatory Visit: Payer: Self-pay

## 2020-02-26 ENCOUNTER — Ambulatory Visit
Admission: RE | Admit: 2020-02-26 | Discharge: 2020-02-26 | Disposition: A | Discharge status: Home / Self Care | Payer: 59 | Source: Ambulatory Visit | Attending: Oncology | Admitting: Oncology

## 2020-02-26 ENCOUNTER — Other Ambulatory Visit: Payer: Self-pay | Admitting: Diagnostic Radiology

## 2020-02-26 ENCOUNTER — Other Ambulatory Visit (HOSPITAL_COMMUNITY)
Admission: RE | Admit: 2020-02-26 | Discharge: 2020-02-26 | Disposition: A | Discharge status: Home / Self Care | Payer: 59 | Source: Ambulatory Visit | Attending: Oncology | Admitting: Oncology

## 2020-02-26 DIAGNOSIS — Z853 Personal history of malignant neoplasm of breast: Secondary | ICD-10-CM

## 2020-02-26 DIAGNOSIS — N6320 Unspecified lump in the left breast, unspecified quadrant: Secondary | ICD-10-CM

## 2020-02-26 DIAGNOSIS — N6323 Unspecified lump in the left breast, lower outer quadrant: Secondary | ICD-10-CM | POA: Insufficient documentation

## 2020-02-26 HISTORY — PX: BREAST BIOPSY: SHX20

## 2020-02-29 LAB — CYTOLOGY - NON PAP

## 2020-03-16 ENCOUNTER — Other Ambulatory Visit: Payer: Self-pay | Admitting: Oncology

## 2020-03-19 ENCOUNTER — Other Ambulatory Visit: Payer: Self-pay | Admitting: Family Medicine

## 2020-04-16 ENCOUNTER — Other Ambulatory Visit: Payer: Self-pay | Admitting: Family Medicine

## 2020-06-28 ENCOUNTER — Telehealth: Payer: Self-pay | Admitting: Family Medicine

## 2020-06-28 NOTE — Telephone Encounter (Signed)
Opened in error

## 2020-06-29 ENCOUNTER — Ambulatory Visit (INDEPENDENT_AMBULATORY_CARE_PROVIDER_SITE_OTHER): Payer: 59

## 2020-06-29 ENCOUNTER — Other Ambulatory Visit: Payer: Self-pay

## 2020-06-29 DIAGNOSIS — Z23 Encounter for immunization: Secondary | ICD-10-CM | POA: Diagnosis not present

## 2020-07-22 ENCOUNTER — Other Ambulatory Visit: Payer: Self-pay | Admitting: Oncology

## 2020-07-22 DIAGNOSIS — N63 Unspecified lump in unspecified breast: Secondary | ICD-10-CM

## 2020-08-13 ENCOUNTER — Other Ambulatory Visit: Payer: Self-pay | Admitting: Oncology

## 2020-08-25 ENCOUNTER — Other Ambulatory Visit: Payer: Self-pay

## 2020-08-25 ENCOUNTER — Encounter: Payer: Self-pay | Admitting: Family Medicine

## 2020-08-25 ENCOUNTER — Ambulatory Visit: Payer: 59 | Admitting: Family Medicine

## 2020-08-25 VITALS — BP 128/76 | HR 73 | Temp 96.9°F | Ht 67.0 in | Wt 178.6 lb

## 2020-08-25 DIAGNOSIS — E119 Type 2 diabetes mellitus without complications: Secondary | ICD-10-CM | POA: Diagnosis not present

## 2020-08-25 DIAGNOSIS — I10 Essential (primary) hypertension: Secondary | ICD-10-CM | POA: Diagnosis not present

## 2020-08-25 DIAGNOSIS — E785 Hyperlipidemia, unspecified: Secondary | ICD-10-CM | POA: Diagnosis not present

## 2020-08-25 DIAGNOSIS — E1169 Type 2 diabetes mellitus with other specified complication: Secondary | ICD-10-CM | POA: Diagnosis not present

## 2020-08-25 LAB — BASIC METABOLIC PANEL
BUN: 15 mg/dL (ref 6–23)
CO2: 31 mEq/L (ref 19–32)
Calcium: 9.4 mg/dL (ref 8.4–10.5)
Chloride: 101 mEq/L (ref 96–112)
Creatinine, Ser: 0.86 mg/dL (ref 0.40–1.20)
GFR: 76.81 mL/min (ref >60.00)
Glucose, Bld: 94 mg/dL (ref 70–99)
Potassium: 3.5 mEq/L (ref 3.5–5.1)
Sodium: 138 mEq/L (ref 135–145)

## 2020-08-25 LAB — LIPID PANEL
Cholesterol: 96 mg/dL (ref 0–200)
HDL: 41.3 mg/dL (ref >39.00)
LDL Cholesterol: 43 mg/dL (ref 0–99)
NonHDL: 54.9
Total CHOL/HDL Ratio: 2
Triglycerides: 62 mg/dL (ref 0.0–149.0)
VLDL: 12.4 mg/dL (ref 0.0–40.0)

## 2020-08-25 LAB — HEMOGLOBIN A1C: Hgb A1c MFr Bld: 5.8 % (ref 4.6–6.5)

## 2020-08-25 MED ORDER — TRIAMTERENE-HCTZ 75-50 MG PO TABS: 0.5000 | ORAL_TABLET | Freq: Every day | ORAL | 11 refills | Status: DC

## 2020-08-25 MED ORDER — ROSUVASTATIN CALCIUM 10 MG PO TABS: 10.0000 mg | ORAL_TABLET | Freq: Every day | ORAL | 11 refills | Status: DC

## 2020-08-25 MED ORDER — METFORMIN HCL 500 MG PO TABS: 500.0000 mg | ORAL_TABLET | Freq: Two times a day (BID) | ORAL | 11 refills | Status: DC

## 2020-08-25 MED ORDER — LOSARTAN POTASSIUM 50 MG PO TABS: 50.0000 mg | ORAL_TABLET | Freq: Every day | ORAL | 11 refills | Status: DC

## 2020-08-25 MED ORDER — POTASSIUM CHLORIDE CRYS ER 20 MEQ PO TBCR: 40.0000 meq | EXTENDED_RELEASE_TABLET | Freq: Every day | ORAL | 11 refills | Status: DC

## 2020-08-25 NOTE — Progress Notes (Signed)
Subjective:    Patient ID: Lindsey Wright, female    DOB: [birth year], 54 y.o.   MRN: [id number]  This visit occurred during the SARS-CoV-2 public health emergency.  Safety protocols were in place, including screening questions prior to the visit, additional usage of staff PPE, and extensive cleaning of exam room while observing appropriate contact time as indicated for disinfecting solutions.    HPI Pr presents for f/u of chronic health problems   Wt Readings from Last 3 Encounters:  08/25/20 178 lb 9 oz (81 kg)  02/09/20 175 lb 8 oz (79.6 kg)  01/05/20 178 lb 8 oz (81 kg)   27.97 kg/m   Feeling ok overall  Taking care of herself  Still walking for exercise -gets outside   HTN bp is stable today  No cp or palpitations or headaches or edema  No side effects to medicines  BP Readings from Last 3 Encounters:  08/25/20 128/76  02/09/20 137/77  01/05/20 127/67     Taking losartan 50 mg daily  maxzide 75-50 mg 1/2 pill daily   Pulse Rate: 73    Lab Results  Component Value Date   CREATININE 0.91 02/09/2020   BUN 22 (H) 02/09/2020   NA 140 02/09/2020   K 3.8 02/09/2020   CL 103 02/09/2020   CO2 27 02/09/2020    DM2 Takes metformin 500 mg bid  Has been well controlled/ due for labs On arb and statin  Eye exam 5/21 no retinopathy   She is eating fairly well  occ cheats with a bowl of cereal  More smoothies for breakfast now or egg and [place] bacon  Walking for exercise   Hyperlipidemia Lab Results  Component Value Date   CHOL 104 01/01/2020   HDL 37.50 (L) 01/01/2020   LDLCALC 52 01/01/2020   TRIG 69.0 01/01/2020   CHOLHDL 3 01/01/2020   crestor 10 mg daily  Diet is good   Has a 6 mo f/u mammogram (had neg bx last time for mass near her lumpectomy site)  Sees gyn   covid immunized   Patient Active Problem List   Diagnosis Date Noted   Hyperlipidemia associated with type 2 diabetes mellitus ([place]) 10/04/2019   Low back pain 11/17/2018    Port-A-Cath in place 01/20/2018   Malignant neoplasm of lower-outer quadrant of 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, estrogen receptor positive ([place]) 12/26/2017   Colon cancer screening 01/29/2017   Controlled type 2 diabetes mellitus without complication, without long-term current use of insulin ([place]) 09/25/2013   Essential hypertension 08/15/2007   Past Medical History:  Diagnosis Date   Anemia    iron deficient anemia   Breast cancer ([place])    Diabetes mellitus without complication ([place])    History of Bell's palsy     x 2 as teenager and in 20's   Hypertension    Menorrhagia    Overweight(278.02)    Peripheral edema    Past Surgical History:  Procedure Laterality Date   BREAST BIOPSY Right 02/01/2015   benign   BREAST LUMPECTOMY Left 2019   BREAST LUMPECTOMY WITH RADIOACTIVE SEED AND SENTINEL LYMPH NODE BIOPSY Left 06/16/2018   Procedure: LEFT BREAST LUMPECTOMY WITH RADIOACTIVE SEED X'S 2 WITH SEED TARGETED LYMPH NODE EXCISION AND SENTINEL LYMPH NODE BIOPSY;  Surgeon: [name], MD;  Location: [facility];  Service: General;  Laterality: Left;   CESAREAN SECTION CLASSICAL     CHOLECYSTECTOMY     COSMETIC SURGERY  PORT-A-CATH REMOVAL Right 10/30/2018   Procedure: REMOVAL PORT-A-CATH;  Surgeon: [name], MD;  Location: [facility];  Service: General;  Laterality: Right;   PORTACATH PLACEMENT Right 01/17/2018   Procedure: INSERTION PORT-A-CATH;  Surgeon: [name], MD;  Location: [facility];  Service: General;  Laterality: Right;   TUBAL LIGATION     Social History   Tobacco Use   Smoking status: Never Smoker   Smokeless tobacco: Never Used  Substance Use Topics   Alcohol use: No    Alcohol/week: 0.0 standard drinks   Drug use: No   Family History  Problem Relation Age of Onset   Hypertension Mother    Diabetes Mother    Stroke Mother    Alcohol abuse Father    Cancer Father        lung ca    Hypertension Father    Hypertension Sister    Colon cancer Neg Hx    Colon polyps Neg Hx    Rectal cancer Neg Hx    Stomach cancer Neg Hx    Allergies  Allergen Reactions   Ace Inhibitors Cough   Advil [Ibuprofen]     Hives    Current Outpatient Medications on File Prior to Visit  Medication Sig Dispense Refill   Blood Glucose Monitoring Suppl (ONETOUCH VERIO FLEX SYSTEM) w/Device KIT To check glucose twice daily and prn for uncontrolled diabetes type 2 (E11.9) 1 kit 0   [employer] D3 25 MCG (1000 UT) capsule TAKE 1 CAPSULE BY MOUTH EVERY DAY 30 capsule 16   glucose blood (ONETOUCH VERIO) test strip To check glucose twice daily and prn for uncontrolled diabetes type 2 (E11.9) 100 each 3   Lancets (ONETOUCH ULTRASOFT) lancets To check glucose twice daily and prn for uncontrolled diabetes 2 (E11.9) 100 each 3   No current facility-administered medications on file prior to visit.    Review of Systems  Constitutional: Negative for activity change, appetite change, fatigue, fever and unexpected weight change.  HENT: Negative for congestion, ear pain, rhinorrhea, sinus pressure and sore throat.   Eyes: Negative for pain, redness and visual disturbance.  Respiratory: Negative for cough, shortness of breath and wheezing.   Cardiovascular: Negative for chest pain and palpitations.  Gastrointestinal: Negative for abdominal pain, blood in stool, constipation and diarrhea.  Endocrine: Negative for polydipsia and polyuria.  Genitourinary: Negative for dysuria, frequency and urgency.  Musculoskeletal: Negative for arthralgias, back pain and myalgias.  Skin: Negative for pallor and rash.  Allergic/Immunologic: Negative for environmental allergies.  Neurological: Negative for dizziness, syncope and headaches.  Hematological: Negative for adenopathy. Does not bruise/bleed easily.  Psychiatric/Behavioral: Negative for decreased concentration and dysphoric mood. The patient is not  nervous/anxious.        Objective:   Physical Exam Constitutional:      General: She is not in acute distress.    Appearance: Normal appearance. She is well-developed, normal weight and well-nourished. She is not ill-appearing.  HENT:     Head: Normocephalic and atraumatic.     Mouth/Throat:     Mouth: Oropharynx is clear and moist.  Eyes:     Extraocular Movements: EOM normal.     Conjunctiva/sclera: Conjunctivae normal.     Pupils: Pupils are equal, round, and reactive to light.  Neck:     Thyroid: No thyromegaly.     Vascular: No carotid bruit or JVD.  Cardiovascular:     Rate and Rhythm: Normal rate and regular rhythm.  Pulses: Intact distal pulses.     Heart sounds: Normal heart sounds. No gallop.   Pulmonary:     Effort: Pulmonary effort is normal. No respiratory distress.     Breath sounds: Normal breath sounds. No wheezing or rales.     Comments: No crackles Abdominal:     General: Bowel sounds are normal. There is no distension or abdominal bruit.     Palpations: Abdomen is soft. There is no mass.     Tenderness: There is no abdominal tenderness.  Musculoskeletal:        General: No edema.     Cervical back: Normal range of motion and neck supple.     Right lower leg: No edema.     Left lower leg: No edema.  Lymphadenopathy:     Cervical: No cervical adenopathy.  Skin:    General: Skin is warm and dry.     Findings: No rash.  Neurological:     Mental Status: She is alert.     Sensory: No sensory deficit.     Coordination: Coordination normal.     Deep Tendon Reflexes: Reflexes are normal and symmetric. Reflexes normal.  Psychiatric:        Mood and Affect: Mood and affect and mood normal.           Assessment & Plan:   Problem List Items Addressed This Visit      Cardiovascular and Mediastinum   Essential hypertension    bp in fair control at this time  BP Readings from Last 1 Encounters:  08/25/20 128/76   No changes needed Most recent  labs reviewed  Disc lifstyle change with low sodium diet and exercise  Continues losartan 50 mg daily and triam-hct 75-50 mg 1/2 pill daily  Labs today  Watching K      Relevant Medications   losartan (COZAAR) 50 MG tablet   rosuvastatin (CRESTOR) 10 MG tablet   triamterene-hydrochlorothiazide (MAXZIDE) 75-50 MG tablet   Other Relevant Orders   Basic metabolic panel     Endocrine   Controlled type 2 diabetes mellitus without complication, without long-term current use of insulin (HCC) - Primary    Glucose readings at home remain in low 100s Good diet and exercise habits  A1C today On ace and statin  Eye exam utd       Relevant Medications   losartan (COZAAR) 50 MG tablet   metFORMIN (GLUCOPHAGE) 500 MG tablet   rosuvastatin (CRESTOR) 10 MG tablet   Other Relevant Orders   Hemoglobin A1c   Hyperlipidemia associated with type 2 diabetes mellitus ([place])    Labs today Disc goals for lipids and reasons to control them Rev last labs with pt Rev low sat fat diet in detail Great diet  Plan to continue crestor 10 mg daily      Relevant Medications   losartan (COZAAR) 50 MG tablet   metFORMIN (GLUCOPHAGE) 500 MG tablet   rosuvastatin (CRESTOR) 10 MG tablet   Other Relevant Orders   Lipid panel

## 2020-08-25 NOTE — Patient Instructions (Signed)
Take care of yourself   Labs today   Keep walking and eating well

## 2020-08-25 NOTE — Assessment & Plan Note (Signed)
Glucose readings at home remain in low 100s Good diet and exercise habits  A1C today On ace and statin  Eye exam utd

## 2020-08-25 NOTE — Assessment & Plan Note (Signed)
Labs today Disc goals for lipids and reasons to control them Rev last labs with pt Rev low sat fat diet in detail Great diet  Plan to continue crestor 10 mg daily

## 2020-08-25 NOTE — Assessment & Plan Note (Signed)
bp in fair control at this time  BP Readings from Last 1 Encounters:  08/25/20 128/76   No changes needed Most recent labs reviewed  Disc lifstyle change with low sodium diet and exercise  Continues losartan 50 mg daily and triam-hct 75-50 mg 1/2 pill daily  Labs today  Watching K

## 2020-09-01 ENCOUNTER — Other Ambulatory Visit: Payer: Self-pay

## 2020-09-01 ENCOUNTER — Ambulatory Visit
Admission: RE | Admit: 2020-09-01 | Discharge: 2020-09-01 | Disposition: A | Discharge status: Home / Self Care | Payer: 59 | Source: Ambulatory Visit | Attending: Oncology | Admitting: Oncology

## 2020-09-01 ENCOUNTER — Ambulatory Visit: Admission: RE | Admit: 2020-09-01 | Payer: 59 | Source: Ambulatory Visit

## 2020-09-01 DIAGNOSIS — N63 Unspecified lump in unspecified breast: Secondary | ICD-10-CM

## 2020-09-01 HISTORY — DX: Personal history of antineoplastic chemotherapy: Z92.21

## 2020-09-01 HISTORY — DX: Personal history of irradiation: Z92.3

## 2020-09-12 ENCOUNTER — Other Ambulatory Visit: Payer: 59

## 2020-09-12 DIAGNOSIS — Z20822 Contact with and (suspected) exposure to covid-19: Secondary | ICD-10-CM

## 2020-09-13 LAB — SARS-COV-2, NAA 2 DAY TAT

## 2020-09-13 LAB — NOVEL CORONAVIRUS, NAA: SARS-CoV-2, NAA: NOT DETECTED

## 2020-09-22 ENCOUNTER — Ambulatory Visit: Payer: 59 | Attending: Internal Medicine

## 2020-09-22 DIAGNOSIS — Z23 Encounter for immunization: Secondary | ICD-10-CM

## 2020-09-22 NOTE — Progress Notes (Signed)
   Covid-19 Vaccination Clinic  Name:  Lindsey Wright    MRN: [id number] DOB: [birth year]  09/22/2020  Lindsey Wright was observed post Covid-19 immunization for 15 minutes without incident. She was provided with Vaccine Information Sheet and instruction to access the V-Safe system.   Lindsey Wright was instructed to call 911 with any severe reactions post vaccine: Marland Kitchen Difficulty breathing  . Swelling of face and throat  . A fast heartbeat  . A bad rash all over body  . Dizziness and weakness   Immunizations Administered    Name Date Dose VIS Date Route   Pfizer COVID-19 Vaccine 09/22/2020  3:04 PM 0.3 mL 06/29/2020 Intramuscular   Manufacturer: [place]   Lot: [id number]   NDC: [address]-8

## 2020-09-23 ENCOUNTER — Ambulatory Visit: Payer: 59

## 2020-10-10 ENCOUNTER — Telehealth: Payer: Self-pay

## 2020-10-10 NOTE — Telephone Encounter (Signed)
[facility] - Client TELEPHONE ADVICE RECORD [facility] Patient Name: Lindsey Wright Gender: Female DOB: [birth year] Age: 55 Y 30 M 19 D Return Phone Number: [phone] (Primary), [phone] (Secondary) Address: City/State/Zip: [place] [address] Client [facility] - Client Client Site [facility], Roque Lias - MD Contact Type Call Who Is Calling Patient / Member / Family / Caregiver Call Type Triage / Clinical Relationship To Patient Self Return Phone Number [phone] (Secondary) Chief Complaint Nasal Congestion Reason for Call Symptomatic / Request for [place] has a cough and congestion which has lasted about 3 to 4 weeks. Translation No Nurse Assessment Nurse: Rodney Cruise, RN, Hilliard Clark Date/Time (Eastern Time): 10/10/2020 8:16:41 AM Confirm and document reason for call. If symptomatic, describe symptoms. ---Caller states had bad cough for 3-4 weeks, has congestion and is not able to breath out of nose. Clear drainage, Delsum, Netti Pot for symptoms. Had cough and congestion before getting booster and symptoms got worse after. Does the patient have any new or worsening symptoms? ---Yes Will a triage be completed? ---Yes Related visit to physician within the last 2 weeks? ---No Does the PT have any chronic conditions? (i.e. diabetes, asthma, this includes High risk factors for pregnancy, etc.) ---Yes List chronic conditions. ---Diabetes Is the patient pregnant or possibly pregnant? (Ask all females between the ages of 45-55) ---No Is this a behavioral health or substance abuse call? ---No Guidelines Guideline Title Affirmed Question Affirmed Notes Nurse Date/Time (Eastern Time) COVID-19 - Vaccine Questions and Reactions COVID-19 vaccine, systemic reactions (e.g., fatigue, fever, muscle aches), questions about Baxter, RN, Sean  10/10/2020 8:21:13 AM Disp. Time Eilene Ghazi Time) Disposition Final User 10/10/2020 8:07:22 AM Attempt made - no message left Dew, RN, Levada Dy 10/10/2020 8:23:43 AM Call PCP within 24 Hours Yes Baxter, RN, Hilliard Clark PLEASE NOTE: All timestamps contained within this report are represented as [place] Standard Time. CONFIDENTIALTY NOTICE: This fax transmission is intended only for the addressee. It contains information that is legally privileged, confidential or otherwise protected from use or disclosure. If you are not the intended recipient, you are strictly prohibited from reviewing, disclosing, copying using or disseminating any of this information or taking any action in reliance on or regarding this information. If you have received this fax in error, please notify us immediately by telephone so that we can arrange for its return to [place]. Phone: [phone], Toll-Free: [phone], Fax: [phone] Page: 2 of 2 Call Id: [id number] Disposition Overriden: Home Care Override Reason: Patient's symptoms need a higher level of care Caller Disagree/Comply Comply Caller Understands Yes PreDisposition Did not know what to do Care Advice Given Per Guideline HOME CARE: Referrals REFERRED TO PCP OFFICE

## 2020-10-10 NOTE — Telephone Encounter (Signed)
Pt already has video visit with Dr Glori Bickers on 10/11/20 at 11:30.

## 2020-10-10 NOTE — Telephone Encounter (Signed)
Will see her then 

## 2020-10-11 ENCOUNTER — Other Ambulatory Visit: Payer: Self-pay | Admitting: Family Medicine

## 2020-10-11 ENCOUNTER — Ambulatory Visit (HOSPITAL_COMMUNITY)
Admission: RE | Admit: 2020-10-11 | Discharge: 2020-10-11 | Disposition: A | Discharge status: Home / Self Care | Payer: 59 | Source: Ambulatory Visit | Attending: Family Medicine | Admitting: Family Medicine

## 2020-10-11 ENCOUNTER — Other Ambulatory Visit: Payer: Self-pay

## 2020-10-11 ENCOUNTER — Encounter: Payer: Self-pay | Admitting: Family Medicine

## 2020-10-11 ENCOUNTER — Telehealth (INDEPENDENT_AMBULATORY_CARE_PROVIDER_SITE_OTHER): Payer: 59 | Admitting: Family Medicine

## 2020-10-11 DIAGNOSIS — R058 Other specified cough: Secondary | ICD-10-CM

## 2020-10-11 MED ORDER — PROMETHAZINE-DM 6.25-15 MG/5ML PO SYRP
5.0000 mL | ORAL_SOLUTION | Freq: Four times a day (QID) | ORAL | 0 refills | Status: DC | PRN
Start: 2020-10-11 — End: 2021-09-19

## 2020-10-11 MED ORDER — BENZONATATE 200 MG PO CAPS
200.0000 mg | ORAL_CAPSULE | Freq: Three times a day (TID) | ORAL | 1 refills | Status: DC | PRN
Start: 2020-10-11 — End: 2021-09-19

## 2020-10-11 NOTE — Patient Instructions (Signed)
I sent tessalon and promethazine DM to your pharmacy to help cough  Have them let us know if either is not available I also ordered a chest xray at [facility]  The office will call you to tell you where to go for that   Drink fluids and rest  If you have trouble breathing-go to the ER Update if not starting to improve in a week or if worsening

## 2020-10-11 NOTE — Assessment & Plan Note (Signed)
Started with uri about a month ago (tested neg for covid) Not imp with delsym  Some clear sputum/ no reactive airways   Schedule cxr - [facility] pend rev  Sent prometh dm and tessalon to pharmacy for cough  Fluids/rest  Watch for fever or sob or new symptoms  ER parameters discussed  Meds ordered this encounter  Medications  . promethazine-dextromethorphan (PROMETHAZINE-DM) 6.25-15 MG/5ML syrup    Sig: Take 5 mLs by mouth 4 (four) times daily as needed for cough.    Dispense:  118 mL    Refill:  0  . benzonatate (TESSALON) 200 MG capsule    Sig: Take 1 capsule (200 mg total) by mouth 3 (three) times daily as needed for cough. Swallow pill whole    Dispense:  30 capsule    Refill:  1

## 2020-10-11 NOTE — Progress Notes (Signed)
Virtual Visit via Video Note  I connected with Lindsey Wright on 10/11/20 at 11:30 AM EST by a video enabled telemedicine application and verified that I am speaking with the correct person using two identifiers.  Location: Patient: home Provider: office   I discussed the limitations of evaluation and management by telemedicine and the availability of in person appointments. The patient expressed understanding and agreed to proceed.  Parties involved in encounter  Patient: Lindsey Wright  Provider:  Loura Pardon Wright   History of Present Illness: Pt presents for cough and nasal congestion   Has a bad cough  Going on for a while and not improving  A little productive - mucous is clear  Some congestion and cold symptoms   No wheezing No sob  Nasal cong-worse at night (tried vics and netti pot)   Had a covid test the 3rd of January   No facial pain  No ST  No ear pain   otc Delsym   No fever /chills or aches    imm status  Flu shot in [employer] covid vaccination with booster (booster made her sick)    Patient Active Problem List   Diagnosis Date Noted  . Post-viral cough syndrome 10/11/2020  . Hyperlipidemia associated with type 2 diabetes mellitus ([place]) 10/04/2019  . Low back pain 11/17/2018  . Port-A-Cath in place 01/20/2018  . 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 Colon cancer screening 01/29/2017  . Controlled type 2 diabetes mellitus without complication, without long-term current use of insulin (Lindsey Wright) 09/25/2013  . Essential hypertension 08/15/2007   Past Medical History:  Diagnosis Date  . Anemia    iron deficient anemia  . Breast cancer (Lindsey Wright)   . Diabetes mellitus without complication (Lindsey Wright)   . History of Bell's palsy     x 2 as teenager and in 20's  . Hypertension   . Menorrhagia   . Overweight(278.02)   . Peripheral edema   . Personal history of chemotherapy   .  Personal history of radiation therapy    Past Surgical History:  Procedure Laterality Date  . BREAST BIOPSY Left 12/2017  . BREAST BIOPSY Left 02/26/2020  . BREAST LUMPECTOMY Left 2019  . BREAST LUMPECTOMY WITH RADIOACTIVE SEED AND SENTINEL LYMPH NODE BIOPSY Left 06/16/2018   Procedure: LEFT BREAST LUMPECTOMY WITH RADIOACTIVE SEED X'S 2 WITH SEED TARGETED LYMPH NODE EXCISION AND SENTINEL LYMPH NODE BIOPSY;  Surgeon: [name], Wright;  Location: [facility];  Service: General;  Laterality: Left;  . CESAREAN SECTION CLASSICAL    . CHOLECYSTECTOMY    . COSMETIC SURGERY    . PORT-A-CATH REMOVAL Right 10/30/2018   Procedure: REMOVAL PORT-A-CATH;  Surgeon: [name], Wright;  Location: [facility];  Service: General;  Laterality: Right;  . PORTACATH PLACEMENT Right 01/17/2018   Procedure: INSERTION PORT-A-CATH;  Surgeon: [name], Wright;  Location: [facility];  Service: General;  Laterality: Right;  . TUBAL LIGATION     Social History   Tobacco Use  . Smoking status: Never Smoker  . Smokeless tobacco: Never Used  Substance Use Topics  . Alcohol use: No    Alcohol/week: 0.0 standard drinks  . Drug use: No   Family History  Problem Relation Age of Onset  . Hypertension Mother   . Diabetes Mother   . Stroke Mother   . Alcohol abuse Father   . Cancer  Father        lung ca  . Hypertension Father   . Hypertension Sister   . Colon cancer Neg Hx   . Colon polyps Neg Hx   . Rectal cancer Neg Hx   . Stomach cancer Neg Hx    Allergies  Allergen Reactions  . Ace Inhibitors Cough  . Advil [Ibuprofen]     Hives    Current Outpatient Medications on File Prior to Visit  Medication Sig Dispense Refill  . Blood Glucose Monitoring Suppl ([place] FLEX SYSTEM) w/Device KIT To check glucose twice daily and prn for uncontrolled diabetes type 2 (E11.9) 1 kit 0  . [employer] D3 25 MCG (1000 UT) capsule TAKE 1 CAPSULE BY MOUTH EVERY DAY 30 capsule 16   . glucose blood (ONETOUCH VERIO) test strip To check glucose twice daily and prn for uncontrolled diabetes type 2 (E11.9) 100 each 3  . Lancets (ONETOUCH ULTRASOFT) lancets To check glucose twice daily and prn for uncontrolled diabetes 2 (E11.9) 100 each 3  . losartan (COZAAR) 50 MG tablet Take 1 tablet (50 mg total) by mouth daily. 30 tablet 11  . metFORMIN (GLUCOPHAGE) 500 MG tablet Take 1 tablet (500 mg total) by mouth 2 (two) times daily with a meal. 60 tablet 11  . potassium chloride SA (KLOR-CON M20) 20 MEQ tablet Take 2 tablets (40 mEq total) by mouth daily. 60 tablet 11  . rosuvastatin (CRESTOR) 10 MG tablet Take 1 tablet (10 mg total) by mouth daily. 30 tablet 11  . triamterene-hydrochlorothiazide (MAXZIDE) 75-50 MG tablet Take 0.5 tablets by mouth daily. 15 tablet 11   No current facility-administered medications on file prior to visit.   Review of Systems  Constitutional: Negative for chills, fever and malaise/fatigue.  HENT: Positive for congestion. Negative for ear pain, sinus pain and sore throat.   Eyes: Negative for blurred vision, discharge and redness.  Respiratory: Positive for cough and sputum production. Negative for shortness of breath, wheezing and stridor.   Cardiovascular: Negative for chest pain, palpitations and leg swelling.  Gastrointestinal: Negative for abdominal pain, diarrhea, nausea and vomiting.  Musculoskeletal: Negative for myalgias.  Skin: Negative for rash.  Neurological: Negative for dizziness and headaches.    Observations/Objective: Patient appears well, in no distress Weight is baseline  No facial swelling or asymmetry Normal voice-not hoarse and no slurred speech No obvious tremor or mobility impairment Moving neck and UEs normally Able to hear the call well   No wheeze or shortness of breath during interview  Cough sounds dry and spasmatic  Talkative and mentally sharp with no cognitive changes No skin changes on face or neck , no rash or  pallor Affect is normal    Assessment and Plan: Problem List Items Addressed This Visit      Respiratory   Post-viral cough syndrome    Started with uri about a month ago (tested neg for covid) Not imp with delsym  Some clear sputum/ no reactive airways   Schedule cxr - [facility] pend rev  Sent prometh dm and tessalon to pharmacy for cough  Fluids/rest  Watch for fever or sob or new symptoms  ER parameters discussed  Meds ordered this encounter  Medications  . promethazine-dextromethorphan (PROMETHAZINE-DM) 6.25-15 MG/5ML syrup    Sig: Take 5 mLs by mouth 4 (four) times daily as needed for cough.    Dispense:  118 mL    Refill:  0  . benzonatate (TESSALON) 200 MG capsule  Sig: Take 1 capsule (200 mg total) by mouth 3 (three) times daily as needed for cough. Swallow pill whole    Dispense:  30 capsule    Refill:  1            Follow Up Instructions: I sent tessalon and promethazine DM to your pharmacy to help cough  Have them let us know if either is not available I also ordered a chest xray at [facility]  The office will call you to tell you where to go for that   Drink fluids and rest  If you have trouble breathing-go to the ER Update if not starting to improve in a week or if worsening     I discussed the assessment and treatment plan with the patient. The patient was provided an opportunity to ask questions and all were answered. The patient agreed with the plan and demonstrated an understanding of the instructions.   The patient was advised to call back or seek an in-person evaluation if the symptoms worsen or if the condition fails to improve as anticipated.     Lindsey Pardon, Wright

## 2021-01-19 ENCOUNTER — Other Ambulatory Visit: Payer: Self-pay | Admitting: Oncology

## 2021-01-19 DIAGNOSIS — Z1231 Encounter for screening mammogram for malignant neoplasm of breast: Secondary | ICD-10-CM

## 2021-02-10 ENCOUNTER — Telehealth: Payer: Self-pay

## 2021-02-10 NOTE — Telephone Encounter (Signed)
Patient called office states when she went to get her Maxzide and Klor Con they were asking her about her potassium. Advised patient that we have done labs to check and that is one of the reasons why we continue to do follow up and lab work. Let her know that I will send message to Dr. Glori Bickers but she is out of the office today and will address when she returns.

## 2021-02-10 NOTE — Telephone Encounter (Signed)
Called and updated patient who verbalized understanding.

## 2021-02-10 NOTE — Telephone Encounter (Signed)
I think labs were fine with K of 3.5 (normal) in december

## 2021-02-23 ENCOUNTER — Other Ambulatory Visit: Payer: Self-pay | Admitting: Oncology

## 2021-02-23 DIAGNOSIS — Z853 Personal history of malignant neoplasm of breast: Secondary | ICD-10-CM

## 2021-02-25 ENCOUNTER — Other Ambulatory Visit: Payer: Self-pay | Admitting: Family Medicine

## 2021-02-28 ENCOUNTER — Other Ambulatory Visit: Payer: Self-pay

## 2021-02-28 ENCOUNTER — Ambulatory Visit
Admission: RE | Admit: 2021-02-28 | Discharge: 2021-02-28 | Disposition: A | Discharge status: Home / Self Care | Payer: 59 | Source: Ambulatory Visit | Attending: Oncology | Admitting: Oncology

## 2021-02-28 DIAGNOSIS — Z853 Personal history of malignant neoplasm of breast: Secondary | ICD-10-CM

## 2021-03-22 NOTE — Progress Notes (Signed)
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 She continues under observation.  Since her last visit, she underwent bilateral diagnostic mammography with tomography at [facility] on 02/28/2021 showing: breast density category B; no evidence of malignancy in either breast.   She had a biopsy of the left breast lower outer quadrant 02/26/2020 which showed dense fibrosis and chronic inflammation but no evidence of malignancy.    REVIEW OF SYSTEMS: Lindsey Wright retired in early 2021 then went back to work then we retired June 2022.  When she is not working she is putting her house in order.  She takes walks about 30 minutes at a time most days of the week.  She tells me her blood sugars are in good control.  A detailed review of systems today was otherwise stable.   COVID 19 VACCINATION STATUS: [place] x3, most recently 09/2020   HISTORY OF CURRENT ILLNESS: From the original intake note:  "Lindsey Wright" palpated a mass in the left breast about 2 weeks ago. She followed up with her gynecologist, Dr. Jean Wright who referred her to the [facility] for mammography. She underwent unilateral left diagnostic mammography with tomography and left breast ultrasonography at [facility] on 12/20/2017 showing: breast density category B. Suspicious newly palpable left breast mass at the 5  o'Wright lower outer position with internal blow flow measuring 2.3 x 1.8 x 2.5 cm. There are either 2 adjacent masses or a single complicated mass at the 5:63 lower outer position, 4 cm from the nipple measuring 1.8 x 0.5 by 0.9 cm which may represent fibrocystic changes versus a solid mass. No other suspicious findings. Ultrasound showed normal axillary nodes.  Accordingly on 12/23/2017 she proceeded to biopsy of the left breast areas in question. The pathology from this procedure showed ([id number]): At both the 5 o'Wright spanning 1.2 cm and 3:30 position spanning 0.7 cm, invasive ductal carcinoma, grade III. Prognostic indicators significant for: estrogen receptor, 10% positive with weak staining intensity and progesterone receptor, 0% negative. Proliferation marker Ki67 at 90%. HER2 amplified with ratios ER2/CEP17 signals 2.33 and average HER2 copies per cell 4.65  The patient's subsequent history is as detailed below.   PAST MEDICAL HISTORY: Past Medical History:  Diagnosis Date   Anemia    iron deficient anemia   Breast cancer ([place])    Diabetes mellitus without complication ([place])    History of Bell's palsy     x 2 as teenager and in 20's   Hypertension    Menorrhagia    Overweight(278.02)    Peripheral edema    Personal history of chemotherapy    Personal history of radiation therapy     PAST SURGICAL HISTORY: Past Surgical History:  Procedure Laterality Date   BREAST BIOPSY Left 12/2017   BREAST BIOPSY Left 02/26/2020   BREAST LUMPECTOMY Left 2019   BREAST LUMPECTOMY WITH RADIOACTIVE SEED AND SENTINEL LYMPH NODE BIOPSY Left 06/16/2018   Procedure: LEFT BREAST LUMPECTOMY WITH  RADIOACTIVE SEED X'S 2 WITH SEED TARGETED LYMPH NODE EXCISION AND SENTINEL LYMPH NODE BIOPSY;  Surgeon: Lindsey Miner, MD;  Location: [facility];  Service: General;  Laterality: Left;   CESAREAN SECTION CLASSICAL     CHOLECYSTECTOMY     COSMETIC SURGERY     PORT-A-CATH REMOVAL Right  10/30/2018   Procedure: REMOVAL PORT-A-CATH;  Surgeon: Lindsey Miner, MD;  Location: [facility];  Service: General;  Laterality: Right;   PORTACATH PLACEMENT Right 01/17/2018   Procedure: INSERTION PORT-A-CATH;  Surgeon: Lindsey Miner, MD;  Location: [facility];  Service: General;  Laterality: Right;   TUBAL LIGATION      FAMILY HISTORY Family History  Problem Relation Age of Onset   Hypertension Mother    Diabetes Mother    Stroke Mother    Alcohol abuse Father    Cancer Father        lung ca   Hypertension Father    Hypertension Sister    Colon cancer Neg Hx    Colon polyps Neg Hx    Rectal cancer Neg Hx    Stomach cancer Neg Hx   The patient's father was diagnosed with lung cancer at age 20 and died the same year. The patient's mother died at age 65 due to several strokes. The patient had 1 brother who died due to strokes and possibly a MI. The patient has 1 sister. She denies a history of breast or ovarian cancer in the family.    GYNECOLOGIC HISTORY:  Patient's last menstrual period was 06/21/2008 (approximate). Menarche: 55 years old Age at first live birth: 55 years old The patient is GXP2. The patient is not having periods, with her LMP being in 2010. She used oral contraceptive for about 10 years with no complications. She never used HRT.    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  She retired in 2021, went back briefly to work and then we retired in June 2022.  Her husband, Lindsey Wright, works part time in Clinical biochemist.  The patient's older daughter Lindsey Wright age works for [employer] in the telemetry department and also in orthopedics.  She is getting married in 2022 and will start training and physical therapy January 2023.  She the patient's daughter Lindsey Wright is a senior this year at [employer]  She hopes to become an Tourist information centre manager.  Both of the patient's daughters live with her.      ADVANCED DIRECTIVES: In the absence of any documentation to the contrary, the patient's spouse is their HCPOA.    HEALTH MAINTENANCE: Social History   Tobacco Use   Smoking status: Never   Smokeless tobacco: Never  Substance Use Topics   Alcohol use: No    Alcohol/week: 0.0 standard drinks   Drug use: No     Colonoscopy: 03/27/2017 polyp removal/ Dr. Horris Latino  PAP: May 2017 normal  Bone density:   Allergies  Allergen Reactions   Ace Inhibitors Cough   Advil [Ibuprofen]     Hives     Current Outpatient Medications  Medication Sig Dispense Refill   benzonatate (TESSALON) 200 MG capsule Take 1 capsule (200 mg total) by mouth 3 (three) times daily as needed for cough. Swallow pill whole 30 capsule 1   Blood Glucose Monitoring Suppl (ONETOUCH VERIO FLEX SYSTEM) w/Device KIT To check glucose twice daily and prn for uncontrolled diabetes type 2 (E11.9) 1 kit 0  [employer] D3 25 MCG (1000 UT) capsule TAKE 1 CAPSULE BY MOUTH EVERY DAY 30 capsule 16   glucose blood (ONETOUCH VERIO) test strip TO CHECK GLUCOSE TWICE DAILY AND AS NEEDED FOR UNCONTROLLED DIABETES TYPE 2 (E11.9) 100 strip 3   Lancets (ONETOUCH ULTRASOFT) lancets To check glucose twice daily and prn for uncontrolled diabetes 2 (E11.9) 100 each 3   losartan (COZAAR) 50 MG tablet Take 1 tablet (50 mg total) by mouth daily. 30 tablet 11   metFORMIN (GLUCOPHAGE) 500 MG tablet Take 1 tablet (500 mg total) by mouth 2 (two) times daily with a meal. 60 tablet 11   potassium chloride SA (KLOR-CON M20) 20 MEQ tablet Take 2 tablets (40 mEq total) by mouth daily. 60 tablet 11   promethazine-dextromethorphan (PROMETHAZINE-DM) 6.25-15 MG/5ML syrup Take 5 mLs by mouth 4 (four) times daily as needed for cough. 118 mL 0   rosuvastatin (CRESTOR) 10 MG tablet Take 1 tablet (10 mg total) by mouth daily. 30 tablet 11   triamterene-hydrochlorothiazide (MAXZIDE) 75-50 MG tablet Take 0.5 tablets by mouth daily. 15 tablet 11   No current  facility-administered medications for this visit.    OBJECTIVE: African-American woman who appears well  Vitals:   03/23/21 0939  BP: 130/76  Pulse: 66  Resp: 18  Temp: (!) 97.3 F (36.3 C)  SpO2: 99%     Body mass index is 28.63 kg/m.   Wt Readings from Last 3 Encounters:  03/23/21 182 lb 12.8 oz (82.9 kg)  08/25/20 178 lb 9 oz (81 kg)  02/09/20 175 lb 8 oz (79.6 kg)  ECOG FS:1  Sclerae unicteric, EOMs intact Wearing a mask No cervical or supraclavicular adenopathy Lungs no rales or rhonchi Heart regular rate and rhythm Abd soft, nontender, positive bowel sounds MSK no focal spinal tenderness, no upper extremity lymphedema Neuro: nonfocal, well oriented, appropriate affect Breasts: The right breast is benign.  The left breast is slightly coarser and slightly hyperpigmented due to prior treatment, but there is no evidence of local recurrence.  Both axillae are benign.   LAB RESULTS:  CMP     Component Value Date/Time   NA 141 03/23/2021 0925   K 3.8 03/23/2021 0925   CL 102 03/23/2021 0925   CO2 30 03/23/2021 0925   GLUCOSE 79 03/23/2021 0925   BUN 19 03/23/2021 0925   CREATININE 0.88 03/23/2021 0925   CREATININE 0.87 10/07/2018 0858   CALCIUM 10.1 03/23/2021 0925   PROT 8.0 03/23/2021 0925   ALBUMIN 3.9 03/23/2021 0925   AST 22 03/23/2021 0925   AST 22 10/07/2018 0858   ALT 18 03/23/2021 0925   ALT 24 10/07/2018 0858   ALKPHOS 77 03/23/2021 0925   BILITOT 0.6 03/23/2021 0925   BILITOT 0.6 10/07/2018 0858   GFRNONAA >60 03/23/2021 0925   GFRNONAA >60 10/07/2018 0858   GFRAA >60 02/09/2020 1454   GFRAA >60 10/07/2018 0858    No results found for: TOTALPROTELP, ALBUMINELP, A1GS, A2GS, BETS, BETA2SER, GAMS, MSPIKE, SPEI  No results found for: KPAFRELGTCHN, LAMBDASER, KAPLAMBRATIO  Lab Results  Component Value Date   WBC 6.9 03/23/2021   NEUTROABS 4.1 03/23/2021   HGB 13.7 03/23/2021   HCT 41.1 03/23/2021   MCV 92.6 03/23/2021   PLT 198 03/23/2021    No results found for: LABCA2  No components found for: [id number]  No results for input(s): INR in the last 168 hours.  No results found for: LABCA2  No results found for: WUJ811  No results found  for: AJO878  No results found for: MVE720  No results found for: CA2729  No components found for: HGQUANT  No results found for: CEA1 / No results found for: CEA1   No results found for: AFPTUMOR  No results found for: CHROMOGRNA  No results found for: HGBA, HGBA2QUANT, HGBFQUANT, HGBSQUAN (Hemoglobinopathy evaluation)   No results found for: LDH  Lab Results  Component Value Date   IRON 104 09/27/2008   IRONPCTSAT 25.1 09/27/2008   (Iron and TIBC)  No results found for: FERRITIN  Urinalysis    Component Value Date/Time   COLORURINE YELLOW 02/15/2018 2014   APPEARANCEUR CLEAR 02/15/2018 2014   LABSPEC 1.011 02/15/2018 2014   [place] 7.0 02/15/2018 2014   [place] 02/15/2018 2014   HGBUR SMALL (A) 02/15/2018 2014   [place] NEGATIVE 02/15/2018 2014   [place] NEGATIVE 02/15/2018 2014   [place] NEGATIVE 02/15/2018 2014   NITRITE NEGATIVE 02/15/2018 2014   LEUKOCYTESUR NEGATIVE 02/15/2018 2014    STUDIES: MM DIAG BREAST TOMO BILATERAL  Result Date: 02/28/2021 CLINICAL DATA:  Left lumpectomy.  Annual mammography. EXAM: DIGITAL DIAGNOSTIC BILATERAL MAMMOGRAM WITH TOMOSYNTHESIS AND CAD TECHNIQUE: Bilateral digital diagnostic mammography and breast tomosynthesis was performed. The images were evaluated with computer-aided detection. COMPARISON:  Previous exam(s). ACR Breast Density Category b: There are scattered areas of fibroglandular density. FINDINGS: The left lumpectomy site is stable. Skin thickening from radiation therapy is stable. No suspicious masses, calcifications, or distortion are identified in either breast. IMPRESSION: No mammographic evidence of malignancy. Stable lumpectomy site the left. RECOMMENDATION: Per protocol, as the patient is  now 2 or more years status post lumpectomy, she may return to annual screening mammography in 1 year. However, given the history of breast cancer, the patient remains eligible for annual diagnostic mammography if preferred. I have discussed the findings and recommendations with the patient. If applicable, a reminder letter will be sent to the patient regarding the next appointment. BI-RADS CATEGORY  2: Benign. Electronically Signed   By: Dorise Bullion III M.D   On: 02/28/2021 09:25    ELIGIBLE FOR AVAILABLE RESEARCH PROTOCOL: Participating in exact sciences blood draw study   ASSESSMENT: 55 y.o.  Ignacia Palma, [place] woman status post left breast lower outer quadrant biopsy 12/23/2017 for a clinically multifocal T2 N0, stage II invasive ductal carcinoma, grade 3, essentially estrogen receptor and progesterone receptor negative, but HER-2 amplified, with an MIB-1 of 90%.  (a) breast MRI 01/07/2018 shows the 2.6 cm main mass and 4 additional smaller masses spanning 7.1 cm; there was a suspicious left axillary lymph node  (b) left axillary lymph node biopsy 01/14/2018 was benign/discordant (no lymph node tissue)  (1) neoadjuvant chemotherapy consisting of carboplatin and docetaxel every 21 days for 6 cycles started 01/20/2018, completed 05/05/2018, given in conjunction with anti-HER-2 immunotherapy  (2) anti-HER-2 immunotherapy consisting of trastuzumab and Pertuzumab started 01/20/2018, continued for 6 months, last dose 07/29/2018  (a) pertuzumab discontinued after cycle 2 due to diarrhea  (b) baseline echocardiogram 01/08/2018 shows an ejection fraction in the 60-65% range  (c) echocardiogram on 04/24/2018 shows an ejection fraction in the 65-70% range  (d) echocardiogram 07/31/2018 showed an ejection fraction in the 55-60% range  (3) left lumpectomy and sentinel lymph node biopsy 10 07 2019 showed a residual ypT1a ypN0 invasive ductal carcinoma, grade 3, with a repeat prognostic panel now triple  negative  (4) adjuvant radiation 07/28/2018 - 09/15/2018  Site/dose: The patient initially received a dose of 50.4 Gy in 28 fractions to the  left breast using whole-breast tangent fields. This was delivered using a 3-D conformal technique. The patient then received a boost to the seroma. This delivered an additional 10 Gy in 5 fractions using 6X, 10X photons with a Complex Isodose technique. The total dose was 60.4 Gy.    PLAN: Lindsey Wright is coming up on 3 years from definitive surgery for her breast cancer with no evidence of disease recurrence.  This is very favorable.  I commended her on her walking program and suggested she could extended to 45 minutes today from 30.  She has very accomplished daughters and I congratulated her on that.  Otherwise she will see [place] again in a year.  She will have her next mammogram in June.  She will see [place] in July  Total encounter time 20 minutes.*  Iyanna Drummer, Lindsey Dad, MD  03/23/21 10:08 AM Medical Oncology and Hematology [facility] [place], [place] [address] Tel. [phone]    Fax. [phone]   I, Wilburn Mylar, am acting as scribe for Dr. Virgie Wright. Marycruz Boehner.  I, Lurline Del MD, have reviewed the above documentation for accuracy and completeness, and I agree with the above.   *Total Encounter Time as defined by the Centers for Medicare and Medicaid Services includes, in addition to the face-to-face time of a patient visit (documented in the note above) non-face-to-face time: obtaining and reviewing outside history, ordering and reviewing medications, tests or procedures, care coordination (communications with other health care professionals or caregivers) and documentation in the medical record.

## 2021-03-23 ENCOUNTER — Inpatient Hospital Stay: Payer: 59 | Attending: Oncology | Admitting: Oncology

## 2021-03-23 ENCOUNTER — Inpatient Hospital Stay: Payer: 59

## 2021-03-23 ENCOUNTER — Other Ambulatory Visit: Payer: Self-pay

## 2021-03-23 VITALS — BP 130/76 | HR 66 | Temp 97.3°F | Resp 18 | Ht 67.0 in | Wt 182.8 lb

## 2021-03-23 DIAGNOSIS — Z801 Family history of malignant neoplasm of trachea, bronchus and lung: Secondary | ICD-10-CM | POA: Diagnosis not present

## 2021-03-23 DIAGNOSIS — I10 Essential (primary) hypertension: Secondary | ICD-10-CM | POA: Insufficient documentation

## 2021-03-23 DIAGNOSIS — C50512 Malignant neoplasm of lower-outer quadrant of left female breast: Secondary | ICD-10-CM | POA: Diagnosis not present

## 2021-03-23 DIAGNOSIS — Z9221 Personal history of antineoplastic chemotherapy: Secondary | ICD-10-CM | POA: Diagnosis not present

## 2021-03-23 DIAGNOSIS — Z7984 Long term (current) use of oral hypoglycemic drugs: Secondary | ICD-10-CM | POA: Diagnosis not present

## 2021-03-23 DIAGNOSIS — Z853 Personal history of malignant neoplasm of breast: Secondary | ICD-10-CM | POA: Insufficient documentation

## 2021-03-23 DIAGNOSIS — Z170 Estrogen receptor positive status [ER+]: Secondary | ICD-10-CM | POA: Diagnosis not present

## 2021-03-23 DIAGNOSIS — Z79899 Other long term (current) drug therapy: Secondary | ICD-10-CM | POA: Insufficient documentation

## 2021-03-23 DIAGNOSIS — E119 Type 2 diabetes mellitus without complications: Secondary | ICD-10-CM | POA: Diagnosis not present

## 2021-03-23 LAB — CBC WITH DIFFERENTIAL/PLATELET
Abs Immature Granulocytes: 0.02 10*3/uL (ref 0.00–0.07)
Basophils Absolute: 0.1 10*3/uL (ref 0.0–0.1)
Basophils Relative: 1 %
Eosinophils Absolute: 0.2 10*3/uL (ref 0.0–0.5)
Eosinophils Relative: 2 %
HCT: 41.1 % (ref 36.0–46.0)
Hemoglobin: 13.7 g/dL (ref 12.0–15.0)
Immature Granulocytes: 0 %
Lymphocytes Relative: 27 %
Lymphs Abs: 1.9 10*3/uL (ref 0.7–4.0)
MCH: 30.9 pg (ref 26.0–34.0)
MCHC: 33.3 g/dL (ref 30.0–36.0)
MCV: 92.6 fL (ref 80.0–100.0)
Monocytes Absolute: 0.6 10*3/uL (ref 0.1–1.0)
Monocytes Relative: 9 %
Neutro Abs: 4.1 10*3/uL (ref 1.7–7.7)
Neutrophils Relative %: 61 %
Platelets: 198 10*3/uL (ref 150–400)
RBC: 4.44 MIL/uL (ref 3.87–5.11)
RDW: 13.4 % (ref 11.5–15.5)
WBC: 6.9 10*3/uL (ref 4.0–10.5)
nRBC: 0 % (ref 0.0–0.2)

## 2021-03-23 LAB — COMPREHENSIVE METABOLIC PANEL
ALT: 18 U/L (ref 0–44)
AST: 22 U/L (ref 15–41)
Albumin: 3.9 g/dL (ref 3.5–5.0)
Alkaline Phosphatase: 77 U/L (ref 38–126)
Anion gap: 9 (ref 5–15)
BUN: 19 mg/dL (ref 6–20)
CO2: 30 mmol/L (ref 22–32)
Calcium: 10.1 mg/dL (ref 8.9–10.3)
Chloride: 102 mmol/L (ref 98–111)
Creatinine, Ser: 0.88 mg/dL (ref 0.44–1.00)
GFR, Estimated: >60 mL/min (ref >60)
Glucose, Bld: 79 mg/dL (ref 70–99)
Potassium: 3.8 mmol/L (ref 3.5–5.1)
Sodium: 141 mmol/L (ref 135–145)
Total Bilirubin: 0.6 mg/dL (ref 0.3–1.2)
Total Protein: 8 g/dL (ref 6.5–8.1)

## 2021-03-28 ENCOUNTER — Telehealth: Payer: Self-pay | Admitting: Oncology

## 2021-03-28 NOTE — Telephone Encounter (Signed)
Scheduling appointment per 07/14 los. Patient is aware.

## 2021-04-04 ENCOUNTER — Other Ambulatory Visit: Payer: Self-pay

## 2021-04-04 ENCOUNTER — Ambulatory Visit: Payer: 59 | Attending: Internal Medicine

## 2021-04-04 DIAGNOSIS — Z23 Encounter for immunization: Secondary | ICD-10-CM

## 2021-04-04 MED ORDER — PFIZER-BIONT COVID-19 VAC-TRIS 30 MCG/0.3ML IM SUSP
INTRAMUSCULAR | 0 refills | Status: DC
  Filled 2021-04-04: qty 0.3, 15d supply, fill #0

## 2021-04-04 NOTE — Progress Notes (Signed)
   Covid-19 Vaccination Clinic  Name:  Monifah Nardiello    MRN: [id number] DOB: Sep 08, 1966  04/04/2021  Ms. Ilagan was observed post Covid-19 immunization for 15 minutes without incident. She was provided with Vaccine Information Sheet and instruction to access the V-Safe system.   Ms. Lobell was instructed to call 911 with any severe reactions post vaccine: Difficulty breathing  Swelling of face and throat  A fast heartbeat  A bad rash all over body  Dizziness and weakness   Immunizations Administered     Name Date Dose VIS Date Route   PFIZER Comrnaty(Gray TOP) Covid-19 Vaccine 04/04/2021 10:15 AM 0.3 mL 08/18/2020 Intramuscular   Manufacturer: [place]   Lot: [id number]   [place]: [place], PharmD, MBA Clinical Acute Care Pharmacist

## 2021-06-19 LAB — HM PAP SMEAR

## 2021-06-30 ENCOUNTER — Encounter: Payer: Self-pay | Admitting: Family Medicine

## 2021-09-10 ENCOUNTER — Other Ambulatory Visit: Payer: Self-pay | Admitting: Family Medicine

## 2021-09-11 ENCOUNTER — Other Ambulatory Visit: Payer: Self-pay | Admitting: Family Medicine

## 2021-09-12 ENCOUNTER — Encounter: Payer: Self-pay | Admitting: Family Medicine

## 2021-09-12 NOTE — Telephone Encounter (Signed)
LMTCB to schedule a follow up, mychart sent

## 2021-09-12 NOTE — Telephone Encounter (Signed)
See separate refill request, pt needs an appt before refill given

## 2021-09-12 NOTE — Telephone Encounter (Signed)
Pt is overdue for a CPE or at least a med refill appt., please schedule appt and then route back to me to refill

## 2021-09-13 NOTE — Telephone Encounter (Signed)
Pt scheduled appt for 09/19/21

## 2021-09-19 ENCOUNTER — Encounter: Payer: Self-pay | Admitting: Family Medicine

## 2021-09-19 ENCOUNTER — Other Ambulatory Visit: Payer: Self-pay

## 2021-09-19 ENCOUNTER — Ambulatory Visit: Payer: 59 | Admitting: Family Medicine

## 2021-09-19 VITALS — BP 132/82 | HR 63 | Temp 97.3°F | Resp 16 | Ht 66.0 in | Wt 184.8 lb

## 2021-09-19 DIAGNOSIS — I10 Essential (primary) hypertension: Secondary | ICD-10-CM | POA: Diagnosis not present

## 2021-09-19 DIAGNOSIS — E785 Hyperlipidemia, unspecified: Secondary | ICD-10-CM | POA: Diagnosis not present

## 2021-09-19 DIAGNOSIS — E119 Type 2 diabetes mellitus without complications: Secondary | ICD-10-CM | POA: Diagnosis not present

## 2021-09-19 DIAGNOSIS — E1169 Type 2 diabetes mellitus with other specified complication: Secondary | ICD-10-CM | POA: Diagnosis not present

## 2021-09-19 LAB — CBC WITH DIFFERENTIAL/PLATELET
Basophils Absolute: 0 10*3/uL (ref 0.0–0.1)
Basophils Relative: 0.5 % (ref 0.0–3.0)
Eosinophils Absolute: 0.1 10*3/uL (ref 0.0–0.7)
Eosinophils Relative: 1.8 % (ref 0.0–5.0)
HCT: 40.8 % (ref 36.0–46.0)
Hemoglobin: 13.6 g/dL (ref 12.0–15.0)
Lymphocytes Relative: 25.8 % (ref 12.0–46.0)
Lymphs Abs: 1.8 10*3/uL (ref 0.7–4.0)
MCHC: 33.3 g/dL (ref 30.0–36.0)
MCV: 92 fl (ref 78.0–100.0)
Monocytes Absolute: 0.5 10*3/uL (ref 0.1–1.0)
Monocytes Relative: 7 % (ref 3.0–12.0)
Neutro Abs: 4.5 10*3/uL (ref 1.4–7.7)
Neutrophils Relative %: 64.9 % (ref 43.0–77.0)
Platelets: 200 10*3/uL (ref 150.0–400.0)
RBC: 4.43 Mil/uL (ref 3.87–5.11)
RDW: 14.1 % (ref 11.5–15.5)
WBC: 6.9 10*3/uL (ref 4.0–10.5)

## 2021-09-19 LAB — COMPREHENSIVE METABOLIC PANEL
ALT: 22 U/L (ref 0–35)
AST: 24 U/L (ref 0–37)
Albumin: 4.3 g/dL (ref 3.5–5.2)
Alkaline Phosphatase: 78 U/L (ref 39–117)
BUN: 15 mg/dL (ref 6–23)
CO2: 28 mEq/L (ref 19–32)
Calcium: 9.4 mg/dL (ref 8.4–10.5)
Chloride: 100 mEq/L (ref 96–112)
Creatinine, Ser: 0.8 mg/dL (ref 0.40–1.20)
GFR: 83.15 mL/min (ref >60.00)
Glucose, Bld: 94 mg/dL (ref 70–99)
Potassium: 3.8 mEq/L (ref 3.5–5.1)
Sodium: 137 mEq/L (ref 135–145)
Total Bilirubin: 0.7 mg/dL (ref 0.2–1.2)
Total Protein: 7.3 g/dL (ref 6.0–8.3)

## 2021-09-19 LAB — LIPID PANEL
Cholesterol: 103 mg/dL (ref 0–200)
HDL: 45 mg/dL (ref >39.00)
LDL Cholesterol: 45 mg/dL (ref 0–99)
NonHDL: 58.47
Total CHOL/HDL Ratio: 2
Triglycerides: 65 mg/dL (ref 0.0–149.0)
VLDL: 13 mg/dL (ref 0.0–40.0)

## 2021-09-19 LAB — HEMOGLOBIN A1C: Hgb A1c MFr Bld: 6.3 % (ref 4.6–6.5)

## 2021-09-19 LAB — TSH: TSH: 1.05 u[IU]/mL (ref 0.35–5.50)

## 2021-09-19 MED ORDER — POTASSIUM CHLORIDE CRYS ER 20 MEQ PO TBCR: 40.0000 meq | EXTENDED_RELEASE_TABLET | Freq: Every day | ORAL | 11 refills | Status: DC

## 2021-09-19 MED ORDER — METFORMIN HCL 500 MG PO TABS: 500.0000 mg | ORAL_TABLET | Freq: Two times a day (BID) | ORAL | 11 refills | Status: DC

## 2021-09-19 MED ORDER — TRIAMTERENE-HCTZ 75-50 MG PO TABS
0.5000 | ORAL_TABLET | Freq: Every day | ORAL | 11 refills | Status: DC
Start: 2021-09-19 — End: 2022-10-10

## 2021-09-19 MED ORDER — ROSUVASTATIN CALCIUM 10 MG PO TABS: 10.0000 mg | ORAL_TABLET | Freq: Every day | ORAL | 11 refills | Status: DC

## 2021-09-19 MED ORDER — LOSARTAN POTASSIUM 50 MG PO TABS: 50.0000 mg | ORAL_TABLET | Freq: Every day | ORAL | 11 refills | Status: DC

## 2021-09-19 NOTE — Assessment & Plan Note (Signed)
Disc goals for lipids and reasons to control them Rev last labs with pt Rev low sat fat diet in detail  Taking crestor 10 mg daily  Diet is fair  Labs ordered

## 2021-09-19 NOTE — Assessment & Plan Note (Signed)
Suspect she is in good control a1c ordered  Would like to get DM eye exam here when able Taking arb and stable  Tolerating metformin 500 mg bid well  Encouraged low glycemic diet

## 2021-09-19 NOTE — Patient Instructions (Addendum)
I will see if we can put you on the schedule for diabetic eye exam here  Someone will give you a call   Schedule your colonoscopy also   For diabetes Try to get most of your carbohydrates from produce (with the exception of white potatoes)  Eat less bread/pasta/rice/snack foods/cereals/sweets and other items from the middle of the grocery store (processed carbs)  Labs today   I do recommend the newest covid booster (bivalent)   Take care of yourself   Work on weight loss

## 2021-09-19 NOTE — Progress Notes (Signed)
Subjective:    Patient ID: Lindsey Wright, female    DOB: 24-Apr-1966, 56 y.o.   MRN: [id number]  This visit occurred during the SARS-CoV-2 public health emergency.  Safety protocols were in place, including screening questions prior to the visit, additional usage of staff PPE, and extensive cleaning of exam room while observing appropriate contact time as indicated for disinfecting solutions.   HPI Pt presents for f/u of chronic health problems incl HTN and DM2  Wt Readings from Last 3 Encounters:  09/19/21 184 lb 12.8 oz (83.8 kg)  03/23/21 182 lb 12.8 oz (82.9 kg)  08/25/20 178 lb 9 oz (81 kg)   29.83 kg/m  Daughter got married and going to PT school   Feeling pretty good  Taking fair care of herself -now as things calm down   Walking for exercise  Eating fairly well /gets off track , but then gets back into it after holidays    HTN  bp is stable today  No cp or palpitations or headaches or edema  No side effects to medicines  BP Readings from Last 3 Encounters:  09/19/21 132/82  03/23/21 130/76  08/25/20 128/76     Losartan 50 mg daily  Triam hct 75-50 1/2 pill daily   DM2 Lab Results  Component Value Date   HGBA1C 5.8 08/25/2020   Due for labs Metformin 500 mg bid -no side effects  Taking arb and statin    Glucose today was 125  Does not check often -usually 115-120s    Eye care  01/2020 She did not go this past year , she goes    Foot care -good  Flu shot 08/2021  Pna vaccine-declines   Hyperlipidemia Lab Results  Component Value Date   CHOL 96 08/25/2020   HDL 41.30 08/25/2020   LDLCALC 43 08/25/2020   TRIG 62.0 08/25/2020   CHOLHDL 2 08/25/2020   Crestor 10 mg daily   Patient Active Problem List   Diagnosis Date Noted   Post-viral cough syndrome 10/11/2020   Hyperlipidemia associated with type 2 diabetes mellitus ([place]) 10/04/2019   Low back pain 11/17/2018   Port-A-Cath in place 01/20/2018   Malignant neoplasm of  lower-outer quadrant of 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, estrogen receptor positive ([place]) 12/26/2017   Colon cancer screening 01/29/2017   Controlled type 2 diabetes mellitus without complication, without long-term current use of insulin ([place]) 09/25/2013   Essential hypertension 08/15/2007   Past Medical History:  Diagnosis Date   Anemia    iron deficient anemia   Breast cancer ([place])    Diabetes mellitus without complication ([place])    History of Bell's palsy     x 2 as teenager and in 20's   Hypertension    Menorrhagia    Overweight(278.02)    Peripheral edema    Personal history of chemotherapy    Personal history of radiation therapy    Past Surgical History:  Procedure Laterality Date   BREAST BIOPSY Left 12/2017   BREAST BIOPSY Left 02/26/2020   BREAST LUMPECTOMY Left 2019   BREAST LUMPECTOMY WITH RADIOACTIVE SEED AND SENTINEL LYMPH NODE BIOPSY Left 06/16/2018   Procedure: LEFT BREAST LUMPECTOMY WITH RADIOACTIVE SEED X'S 2 WITH SEED TARGETED LYMPH NODE EXCISION AND SENTINEL LYMPH NODE BIOPSY;  Surgeon: [name], MD;  Location: [facility];  Service: General;  Laterality: Left;   [place]  REMOVAL Right 10/30/2018   Procedure: REMOVAL PORT-A-CATH;  Surgeon: [name], MD;  Location: [facility];  Service: General;  Laterality: Right;   PORTACATH PLACEMENT Right 01/17/2018   Procedure: INSERTION PORT-A-CATH;  Surgeon: [name], MD;  Location: [facility];  Service: General;  Laterality: Right;   TUBAL LIGATION     Social History   Tobacco Use   Smoking status: Never   Smokeless tobacco: Never  Substance Use Topics   Alcohol use: No    Alcohol/week: 0.0 standard drinks   Drug use: No   Family History  Problem Relation Age of Onset   Hypertension Mother    Diabetes Mother    Stroke Mother    Alcohol abuse Father    Cancer Father        lung ca    Hypertension Father    Hypertension Sister    Colon cancer Neg Hx    Colon polyps Neg Hx    Rectal cancer Neg Hx    Stomach cancer Neg Hx    Allergies  Allergen Reactions   Ace Inhibitors Cough   Advil [Ibuprofen]     Hives    Current Outpatient Medications on File Prior to Visit  Medication Sig Dispense Refill   Blood Glucose Monitoring Suppl (ONETOUCH VERIO FLEX SYSTEM) w/Device KIT To check glucose twice daily and prn for uncontrolled diabetes type 2 (E11.9) 1 kit 0   glucose blood (ONETOUCH VERIO) test strip TO CHECK GLUCOSE TWICE DAILY AND AS NEEDED FOR UNCONTROLLED DIABETES TYPE 2 (E11.9) 100 strip 3   Lancets (ONETOUCH ULTRASOFT) lancets To check glucose twice daily and prn for uncontrolled diabetes 2 (E11.9) 100 each 3   COVID-19 mRNA Vac-TriS, Pfizer, (PFIZER-BIONT COVID-19 VAC-TRIS) SUSP injection Inject into the muscle. 0.3 mL 0   [employer]S D3 25 MCG (1000 UT) capsule TAKE 1 CAPSULE BY MOUTH EVERY DAY 30 capsule 16   No current facility-administered medications on file prior to visit.     Review of Systems  Constitutional:  Positive for fatigue. Negative for activity change, appetite change, fever and unexpected weight change.  HENT:  Negative for congestion, ear pain, rhinorrhea, sinus pressure and sore throat.   Eyes:  Negative for pain, redness and visual disturbance.  Respiratory:  Negative for cough, shortness of breath and wheezing.   Cardiovascular:  Negative for chest pain and palpitations.  Gastrointestinal:  Negative for abdominal pain, blood in stool, constipation and diarrhea.  Endocrine: Negative for polydipsia and polyuria.  Genitourinary:  Negative for dysuria, frequency and urgency.  Musculoskeletal:  Negative for arthralgias, back pain and myalgias.  Skin:  Negative for pallor and rash.  Allergic/Immunologic: Negative for environmental allergies.  Neurological:  Negative for dizziness, syncope and headaches.  Hematological:  Negative for adenopathy. Does  not bruise/bleed easily.  Psychiatric/Behavioral:  Negative for decreased concentration and dysphoric mood. The patient is not nervous/anxious.       Objective:   Physical Exam Constitutional:      General: She is not in acute distress.    Appearance: Normal appearance. She is well-developed. She is obese.  HENT:     Head: Normocephalic and atraumatic.  Eyes:     Conjunctiva/sclera: Conjunctivae normal.     Pupils: Pupils are equal, round, and reactive to light.  Neck:     Thyroid: No thyromegaly.     Vascular: No carotid bruit or JVD.  Cardiovascular:     Rate and Rhythm: Normal rate and regular rhythm.  Heart sounds: Normal heart sounds.    No gallop.  Pulmonary:     Effort: Pulmonary effort is normal. No respiratory distress.     Breath sounds: Normal breath sounds. No wheezing or rales.  Abdominal:     General: Bowel sounds are normal. There is no distension or abdominal bruit.     Palpations: Abdomen is soft. There is no mass.     Tenderness: There is no abdominal tenderness.  Musculoskeletal:     Cervical back: Normal range of motion and neck supple.     Right lower leg: No edema.     Left lower leg: No edema.  Lymphadenopathy:     Cervical: No cervical adenopathy.  Skin:    General: Skin is warm and dry.     Coloration: Skin is not pale.     Findings: No rash.  Neurological:     Mental Status: She is alert.     Coordination: Coordination normal.     Deep Tendon Reflexes: Reflexes are normal and symmetric. Reflexes normal.  Psychiatric:        Mood and Affect: Mood normal.          Assessment & Plan:   Problem List Items Addressed This Visit       Cardiovascular and Mediastinum   Essential hypertension    bp in fair control at this time  BP Readings from Last 1 Encounters:  09/19/21 132/82  No changes needed Most recent labs reviewed  Disc lifstyle change with low sodium diet and exercise  Plan to continue losartan 50 mg daily and triam hct  750-50 mg half pill daily      Relevant Medications   losartan (COZAAR) 50 MG tablet   rosuvastatin (CRESTOR) 10 MG tablet   triamterene-hydrochlorothiazide (MAXZIDE) 75-50 MG tablet   Other Relevant Orders   CBC with Differential/Platelet   Comprehensive metabolic panel   Lipid panel   TSH     Endocrine   Controlled type 2 diabetes mellitus without complication, without long-term current use of insulin ([place]) - Primary    Suspect she is in good control a1c ordered  Would like to get DM eye exam here when able Taking arb and stable  Tolerating metformin 500 mg bid well  Encouraged low glycemic diet       Relevant Medications   losartan (COZAAR) 50 MG tablet   metFORMIN (GLUCOPHAGE) 500 MG tablet   rosuvastatin (CRESTOR) 10 MG tablet   Other Relevant Orders   Hemoglobin A1c   Hyperlipidemia associated with type 2 diabetes mellitus ([place])    Disc goals for lipids and reasons to control them Rev last labs with pt Rev low sat fat diet in detail  Taking crestor 10 mg daily  Diet is fair  Labs ordered       Relevant Medications   losartan (COZAAR) 50 MG tablet   metFORMIN (GLUCOPHAGE) 500 MG tablet   rosuvastatin (CRESTOR) 10 MG tablet   triamterene-hydrochlorothiazide (MAXZIDE) 75-50 MG tablet   Other Relevant Orders   Lipid panel

## 2021-09-19 NOTE — Assessment & Plan Note (Signed)
bp in fair control at this time  BP Readings from Last 1 Encounters:  09/19/21 132/82   No changes needed Most recent labs reviewed  Disc lifstyle change with low sodium diet and exercise  Plan to continue losartan 50 mg daily and triam hct 750-50 mg half pill daily

## 2021-12-12 ENCOUNTER — Telehealth: Payer: Self-pay | Admitting: Family Medicine

## 2021-12-12 NOTE — Telephone Encounter (Signed)
Patient wants to know when she is scheduled for her free diabetic eye exam, said she wrote it on a piece of paper but can't locate it. ? ?Patient states it is okay to leave date and time on her home phone number ? ? ?

## 2022-01-05 ENCOUNTER — Telehealth: Payer: Self-pay | Admitting: Family Medicine

## 2022-01-05 NOTE — Telephone Encounter (Signed)
Pt is calling asking about an eye exam that she needs done.  ? ?Callback Number: [phone] ?

## 2022-01-08 ENCOUNTER — Telehealth: Payer: Self-pay

## 2022-01-08 NOTE — Telephone Encounter (Signed)
Pt is scheduled for [facility] Eye Exam but forgot when it was. She is needing a phone number to call them to find out when she is scheduled.  ?

## 2022-01-10 NOTE — Telephone Encounter (Signed)
Called and lvm for patient call us back. 

## 2022-01-10 NOTE — Telephone Encounter (Signed)
I will have [facility] reach out.  ?Thanks. ?

## 2022-01-11 NOTE — Telephone Encounter (Signed)
Called spoke w/ spoke   per Anda Kraft pt is come her to have eye exam on 01/24/22 , she will need to call [facility] to get the time to come here to have here eye exam. Pt is said that  she had an vm from [facility] to call them back. ?

## 2022-01-18 ENCOUNTER — Other Ambulatory Visit: Payer: Self-pay | Admitting: Obstetrics & Gynecology

## 2022-01-18 DIAGNOSIS — Z1231 Encounter for screening mammogram for malignant neoplasm of breast: Secondary | ICD-10-CM

## 2022-01-24 LAB — HM DIABETES EYE EXAM

## 2022-01-26 ENCOUNTER — Encounter: Payer: Self-pay | Admitting: Primary Care

## 2022-01-29 ENCOUNTER — Encounter: Payer: Self-pay | Admitting: Gastroenterology

## 2022-03-01 ENCOUNTER — Ambulatory Visit
Admission: RE | Admit: 2022-03-01 | Discharge: 2022-03-01 | Disposition: A | Discharge status: Home / Self Care | Payer: 59 | Source: Ambulatory Visit | Attending: Obstetrics & Gynecology | Admitting: Obstetrics & Gynecology

## 2022-03-01 DIAGNOSIS — Z1231 Encounter for screening mammogram for malignant neoplasm of breast: Secondary | ICD-10-CM

## 2022-03-02 ENCOUNTER — Telehealth: Payer: Self-pay | Admitting: Hematology and Oncology

## 2022-03-05 ENCOUNTER — Other Ambulatory Visit: Payer: Self-pay | Admitting: Obstetrics & Gynecology

## 2022-03-05 DIAGNOSIS — R928 Other abnormal and inconclusive findings on diagnostic imaging of breast: Secondary | ICD-10-CM

## 2022-03-07 ENCOUNTER — Ambulatory Visit (AMBULATORY_SURGERY_CENTER): Payer: Self-pay | Admitting: *Deleted

## 2022-03-07 VITALS — Ht 66.0 in | Wt 188.0 lb

## 2022-03-07 DIAGNOSIS — Z86010 Personal history of colonic polyps: Secondary | ICD-10-CM

## 2022-03-07 MED ORDER — NA SULFATE-K SULFATE-MG SULF 17.5-3.13-1.6 GM/177ML PO SOLN: 1.0000 | Freq: Once | ORAL | 0 refills | Status: AC

## 2022-03-07 NOTE — Progress Notes (Signed)
No egg or soy allergy known to patient  No issues known to pt with past sedation with any surgeries or procedures Patient denies ever being told they had issues or difficulty with intubation  No FH of Malignant Hyperthermia Pt is not on diet pills Pt is not on  home 02  Pt is not on blood thinners  Pt denies issues with constipation  No A fib or A flutter  [employer]  Coupon to pt in PV today  and  NO PA's for preps discussed with pt In PV today  Discussed with pt there will be an out-of-pocket cost for prep and that varies from $0 to 70 +  dollars - pt verbalized understanding  Pt instructed to use Singlecare.com or GoodRx for a price reduction on prep

## 2022-03-09 ENCOUNTER — Other Ambulatory Visit: Payer: Self-pay | Admitting: Obstetrics & Gynecology

## 2022-03-09 ENCOUNTER — Ambulatory Visit
Admission: RE | Admit: 2022-03-09 | Discharge: 2022-03-09 | Disposition: A | Discharge status: Home / Self Care | Payer: 59 | Source: Ambulatory Visit | Attending: Obstetrics & Gynecology | Admitting: Obstetrics & Gynecology

## 2022-03-09 DIAGNOSIS — R928 Other abnormal and inconclusive findings on diagnostic imaging of breast: Secondary | ICD-10-CM

## 2022-03-09 DIAGNOSIS — R921 Mammographic calcification found on diagnostic imaging of breast: Secondary | ICD-10-CM

## 2022-03-22 ENCOUNTER — Inpatient Hospital Stay: Payer: 59

## 2022-03-22 ENCOUNTER — Inpatient Hospital Stay: Payer: 59 | Admitting: Hematology and Oncology

## 2022-03-29 ENCOUNTER — Encounter: Payer: Self-pay | Admitting: Gastroenterology

## 2022-04-04 ENCOUNTER — Ambulatory Visit (AMBULATORY_SURGERY_CENTER): Payer: 59 | Admitting: Gastroenterology

## 2022-04-04 ENCOUNTER — Encounter: Payer: Self-pay | Admitting: Gastroenterology

## 2022-04-04 VITALS — BP 166/64 | HR 77 | Temp 96.8°F | Resp 16 | Ht 66.0 in | Wt 184.0 lb

## 2022-04-04 DIAGNOSIS — D120 Benign neoplasm of cecum: Secondary | ICD-10-CM

## 2022-04-04 DIAGNOSIS — D123 Benign neoplasm of transverse colon: Secondary | ICD-10-CM

## 2022-04-04 DIAGNOSIS — K635 Polyp of colon: Secondary | ICD-10-CM | POA: Diagnosis not present

## 2022-04-04 DIAGNOSIS — Z09 Encounter for follow-up examination after completed treatment for conditions other than malignant neoplasm: Secondary | ICD-10-CM | POA: Diagnosis present

## 2022-04-04 DIAGNOSIS — Z86010 Personal history of colonic polyps: Secondary | ICD-10-CM | POA: Diagnosis not present

## 2022-04-04 MED ORDER — SODIUM CHLORIDE 0.9 % IV SOLN: 500.0000 mL | Freq: Once | INTRAVENOUS | Status: DC

## 2022-04-04 NOTE — Progress Notes (Signed)
PT taken to PACU. Monitors in place. VSS. Report given to RN. 

## 2022-04-04 NOTE — Progress Notes (Signed)
[facility] History and Physical   Primary Care Physician:  Tower, Wynelle Fanny, MD   Reason for Procedure:  History of adenomatous colon polyps  Plan:    Surveillance colonoscopy with possible interventions as needed     HPI: Lindsey Wright is a very pleasant 56 y.o. female here for surveillance colonoscopy. Denies any nausea, vomiting, abdominal pain, melena or bright red blood per rectum  The risks and benefits as well as alternatives of endoscopic procedure(s) have been discussed and reviewed. All questions answered. The patient agrees to proceed.    Past Medical History:  Diagnosis Date   Anemia    iron deficient anemia   Breast cancer ([place])    left   Diabetes mellitus without complication ([place])    History of Bell's palsy    x 2 as teenager and in 20's, last 2016   Hyperlipidemia    on meds   Hypertension    Menorrhagia    Overweight(278.02)    Peripheral edema    Personal history of chemotherapy    Personal history of radiation therapy     Past Surgical History:  Procedure Laterality Date   BREAST BIOPSY Left 12/2017   BREAST BIOPSY Left 02/26/2020   BREAST LUMPECTOMY Left 2019   BREAST LUMPECTOMY WITH RADIOACTIVE SEED AND SENTINEL LYMPH NODE BIOPSY Left 06/16/2018   Procedure: LEFT BREAST LUMPECTOMY WITH RADIOACTIVE SEED X'S 2 WITH SEED TARGETED LYMPH NODE EXCISION AND SENTINEL LYMPH NODE BIOPSY;  Surgeon: [name], MD;  Location: [facility];  Service: General;  Laterality: Left;   CESAREAN SECTION CLASSICAL     x2   CHOLECYSTECTOMY     COLONOSCOPY     POLYPECTOMY     PORT-A-CATH REMOVAL Right 10/30/2018   Procedure: REMOVAL PORT-A-CATH;  Surgeon: [name], MD;  Location: [facility];  Service: General;  Laterality: Right;   PORTACATH PLACEMENT Right 01/17/2018   Procedure: INSERTION PORT-A-CATH;  Surgeon: [name], MD;  Location: [facility];  Service: General;  Laterality: Right;    TUBAL LIGATION      Prior to Admission medications   Medication Sig Start Date End Date Taking? Authorizing Provider  Calcium-Vitamin D-Vitamin K (VIACTIV PO) Take by mouth. 1 chew BID   Yes [provider]  losartan (COZAAR) 50 MG tablet Take 1 tablet (50 mg total) by mouth daily. 09/19/21  Yes Tower, Wynelle Fanny, MD  metFORMIN (GLUCOPHAGE) 500 MG tablet Take 1 tablet (500 mg total) by mouth 2 (two) times daily with a meal. 09/19/21  Yes Tower, Wynelle Fanny, MD  potassium chloride SA (KLOR-CON M20) 20 MEQ tablet Take 2 tablets (40 mEq total) by mouth daily. 09/19/21  Yes Tower, Wynelle Fanny, MD  rosuvastatin (CRESTOR) 10 MG tablet Take 1 tablet (10 mg total) by mouth daily. 09/19/21  Yes Tower, Wynelle Fanny, MD  triamterene-hydrochlorothiazide (MAXZIDE) 75-50 MG tablet Take 0.5 tablets by mouth daily. 09/19/21  Yes Tower, Wynelle Fanny, MD  Blood Glucose Monitoring Suppl ([place]) w/Device KIT To check glucose twice daily and prn for uncontrolled diabetes type 2 (E11.9) 10/02/19   Tower, Wynelle Fanny, MD  COVID-19 mRNA Vac-TriS, Pfizer, (PFIZER-BIONT COVID-19 VAC-TRIS) SUSP injection Inject into the muscle. 04/04/21   Carlyle Basques, MD  glucose blood (ONETOUCH VERIO) test strip TO CHECK GLUCOSE TWICE DAILY AND AS NEEDED FOR UNCONTROLLED DIABETES TYPE 2 (E11.9) 02/28/21   Tower, Wynelle Fanny, MD  Lancets [facility] ULTRASOFT) lancets To check glucose twice  daily and prn for uncontrolled diabetes 2 (E11.9) 10/02/19   Tower, Audrie Gallus, MD    Current Outpatient Medications  Medication Sig Dispense Refill   Calcium-Vitamin D-Vitamin K (VIACTIV PO) Take by mouth. 1 chew BID     losartan (COZAAR) 50 MG tablet Take 1 tablet (50 mg total) by mouth daily. 30 tablet 11   metFORMIN (GLUCOPHAGE) 500 MG tablet Take 1 tablet (500 mg total) by mouth 2 (two) times daily with a meal. 60 tablet 11   potassium chloride SA (KLOR-CON M20) 20 MEQ tablet Take 2 tablets (40 mEq total) by mouth daily. 60 tablet 11   rosuvastatin  (CRESTOR) 10 MG tablet Take 1 tablet (10 mg total) by mouth daily. 30 tablet 11   triamterene-hydrochlorothiazide (MAXZIDE) 75-50 MG tablet Take 0.5 tablets by mouth daily. 15 tablet 11   Blood Glucose Monitoring Suppl (ONETOUCH VERIO FLEX SYSTEM) w/Device KIT To check glucose twice daily and prn for uncontrolled diabetes type 2 (E11.9) 1 kit 0   COVID-19 mRNA Vac-TriS, Pfizer, (PFIZER-BIONT COVID-19 VAC-TRIS) SUSP injection Inject into the muscle. 0.3 mL 0   glucose blood (ONETOUCH VERIO) test strip TO CHECK GLUCOSE TWICE DAILY AND AS NEEDED FOR UNCONTROLLED DIABETES TYPE 2 (E11.9) 100 strip 3   Lancets (ONETOUCH ULTRASOFT) lancets To check glucose twice daily and prn for uncontrolled diabetes 2 (E11.9) 100 each 3   Current Facility-Administered Medications  Medication Dose Route Frequency Provider Last Rate Last Admin   0.9 %  sodium chloride infusion  500 mL Intravenous Once Kahmari Herard, Eleonore Chiquito, MD        Allergies as of 04/04/2022 - Review Complete 04/04/2022  Allergen Reaction Noted   Ace inhibitors Cough 08/15/2007   Advil [ibuprofen]  11/17/2018    Family History  Problem Relation Age of Onset   Hypertension Mother    Diabetes Mother    Stroke Mother    Alcohol abuse Father    Cancer Father        lung ca   Hypertension Father    Hypertension Sister    Colon cancer Neg Hx    Colon polyps Neg Hx    Rectal cancer Neg Hx    Stomach cancer Neg Hx    Esophageal cancer Neg Hx     Social History   Socioeconomic History   Marital status: Married    Spouse name: Not on file   Number of children: Not on file   Years of education: Not on file   Highest education level: Not on file  Occupational History   Not on file  Tobacco Use   Smoking status: Never   Smokeless tobacco: Never  Vaping Use   Vaping Use: Not on file  Substance and Sexual Activity   Alcohol use: No    Alcohol/week: 0.0 standard drinks of alcohol   Drug use: No   Sexual activity: Not Currently     Birth control/protection: Post-menopausal    Comment: BTL  Other Topics Concern   Not on file  Social History Narrative   Not on file   Social Determinants of Health   Financial Resource Strain: Not on file  Food Insecurity: Not on file  Transportation Needs: No Transportation Needs (07/08/2018)   PRAPARE - Administrator, Civil Service (Medical): No    Lack of Transportation (Non-Medical): No  Physical Activity: Not on file  Stress: Not on file  Social Connections: Not on file  Intimate Partner Violence: Not At Risk (07/08/2018)  Humiliation, Afraid, Rape, and Kick questionnaire    Fear of Current or Ex-Partner: No    Emotionally Abused: No    Physically Abused: No    Sexually Abused: No    Review of Systems:  All other review of systems negative except as mentioned in the HPI.  Physical Exam: Vital signs in last 24 hours: BP 140/85   Pulse 68   Temp (!) 96.8 F (36 C)   Ht $R'5\' 6"'wG$  (1.676 m)   Wt 184 lb (83.5 kg)   LMP 06/21/2008 (Approximate)   SpO2 95%   BMI 29.70 kg/m  General:   Alert, NAD Lungs:  Clear .   Heart:  Regular rate and rhythm Abdomen:  Soft, nontender and nondistended. Neuro/Psych:  Alert and cooperative. Normal mood and affect. A and O x 3  Reviewed labs, radiology imaging, old records and pertinent past GI work up  Patient is appropriate for planned procedure(s) and anesthesia in an ambulatory setting   K. Denzil Magnuson , MD [phone]

## 2022-04-04 NOTE — Progress Notes (Signed)
Pt's states no medical or surgical changes since previsit or office visit. 

## 2022-04-04 NOTE — Patient Instructions (Signed)
Handout on polyps, diverticulosis and hemorrhoids provided   Await pathology results.   Continue current medications.   YOU HAD AN ENDOSCOPIC PROCEDURE TODAY AT [facility]:   Refer to the procedure report that was given to you for any specific questions about what was found during the examination.  If the procedure report does not answer your questions, please call your gastroenterologist to clarify.  If you requested that your care partner not be given the details of your procedure findings, then the procedure report has been included in a sealed envelope for you to review at your convenience later.  YOU SHOULD EXPECT: Some feelings of bloating in the abdomen. Passage of more gas than usual.  Walking can help get rid of the air that was put into your GI tract during the procedure and reduce the bloating. If you had a lower endoscopy (such as a colonoscopy or flexible sigmoidoscopy) you may notice spotting of blood in your stool or on the toilet paper. If you underwent a bowel prep for your procedure, you may not have a normal bowel movement for a few days.  Please Note:  You might notice some irritation and congestion in your nose or some drainage.  This is from the oxygen used during your procedure.  There is no need for concern and it should clear up in a day or so.  SYMPTOMS TO REPORT IMMEDIATELY:  Following lower endoscopy (colonoscopy or flexible sigmoidoscopy):  Excessive amounts of blood in the stool  Significant tenderness or worsening of abdominal pains  Swelling of the abdomen that is new, acute  Fever of 100F or higher   For urgent or emergent issues, a gastroenterologist can be reached at any hour by calling [phone]. Do not use MyChart messaging for urgent concerns.    DIET:  We do recommend a small meal at first, but then you may proceed to your regular diet.  Drink plenty of fluids but you should avoid alcoholic beverages for 24 hours.  ACTIVITY:   You should plan to take it easy for the rest of today and you should NOT DRIVE or use heavy machinery until tomorrow (because of the sedation medicines used during the test).    FOLLOW UP: Our staff will call the number listed on your records the next business day following your procedure.  We will call around 7:15- 8:00 am to check on you and address any questions or concerns that you may have regarding the information given to you following your procedure. If we do not reach you, we will leave a message.  If you develop any symptoms (ie: fever, flu-like symptoms, shortness of breath, cough etc.) before then, please call [phone].  If you test positive for Covid 19 in the 2 weeks post procedure, please call and report this information to [place].    If any biopsies were taken you will be contacted by phone or by letter within the next 1-3 weeks.  Please call us at [phone] if you have not heard about the biopsies in 3 weeks.    SIGNATURES/CONFIDENTIALITY: You and/or your care partner have signed paperwork which will be entered into your electronic medical record.  These signatures attest to the fact that that the information above on your After Visit Summary has been reviewed and is understood.  Full responsibility of the confidentiality of this discharge information lies with you and/or your care-partner.

## 2022-04-04 NOTE — Op Note (Signed)
[place] Patient Name: Lindsey Wright Procedure Date: 04/04/2022 10:20 AM MRN: [id number] Endoscopist: Mauri Pole , MD Age: 56 Referring MD:  Date of Birth: 1966-04-24 Gender: Female Account #: [id number] Procedure:                Colonoscopy Indications:              High risk colon cancer surveillance: Personal                            history of colonic polyps, High risk colon cancer                            surveillance: Personal history of adenoma less than                            10 mm in size Medicines:                Monitored Anesthesia Care Procedure:                Pre-Anesthesia Assessment:                           - Prior to the procedure, a History and Physical                            was performed, and patient medications and                            allergies were reviewed. The patient's tolerance of                            previous anesthesia was also reviewed. The risks                            and benefits of the procedure and the sedation                            options and risks were discussed with the patient.                            All questions were answered, and informed consent                            was obtained. Prior Anticoagulants: The patient has                            taken no previous anticoagulant or antiplatelet                            agents. ASA Grade Assessment: II - A patient with                            mild systemic disease. After reviewing the risks  and benefits, the patient was deemed in                            satisfactory condition to undergo the procedure.                           After obtaining informed consent, the colonoscope                            was passed under direct vision. Throughout the                            procedure, the patient's blood pressure, pulse, and                            oxygen saturations were monitored  continuously. The                            PCF-HQ190L Colonoscope was introduced through the                            anus and advanced to the the cecum, identified by                            appendiceal orifice and ileocecal valve. The                            colonoscopy was performed without difficulty. The                            patient tolerated the procedure well. The quality                            of the bowel preparation was good. The ileocecal                            valve, appendiceal orifice, and rectum were                            photographed. Scope In: 10:33:18 AM Scope Out: 10:50:24 AM Scope Withdrawal Time: 0 hours 11 minutes 2 seconds  Total Procedure Duration: 0 hours 17 minutes 6 seconds  Findings:                 The perianal and digital rectal examinations were                            normal.                           Two sessile polyps were found in the transverse                            colon and cecum. The polyps were 4 to 10 mm in  size. These polyps were removed with a cold snare.                            Resection and retrieval were complete.                           A few small-mouthed diverticula were found in the                            sigmoid colon.                           Non-bleeding external and internal hemorrhoids were                            found during retroflexion. The hemorrhoids were                            small. Complications:            No immediate complications. Estimated Blood Loss:     Estimated blood loss was minimal. Impression:               - Two 4 to 10 mm polyps in the transverse colon and                            in the cecum, removed with a cold snare. Resected                            and retrieved.                           - Diverticulosis in the sigmoid colon.                           - Non-bleeding external and internal hemorrhoids. Recommendation:            - Patient has a contact number available for                            emergencies. The signs and symptoms of potential                            delayed complications were discussed with the                            patient. Return to normal activities tomorrow.                            Written discharge instructions were provided to the                            patient.                           - Resume previous diet.                           -  Continue present medications.                           - Await pathology results.                           - Repeat colonoscopy in 3 - 5 years for                            surveillance based on pathology results. Mauri Pole, MD 04/04/2022 10:54:16 AM This report has been signed electronically.

## 2022-04-05 ENCOUNTER — Telehealth: Payer: Self-pay

## 2022-04-05 NOTE — Telephone Encounter (Signed)
  Follow up Call-     04/04/2022    9:03 AM  Call back number  Post procedure Call Back phone  # [phone]  Permission to leave phone message Yes     Patient questions:  Do you have a fever, pain , or abdominal swelling? No. Pain Score  0 *  Have you tolerated food without any problems? Yes.    Have you been able to return to your normal activities? Yes.    Do you have any questions about your discharge instructions: Diet   No. Medications  No. Follow up visit  No.  Do you have questions or concerns about your Care? No.  Actions: * If pain score is 4 or above: No action needed, pain <4.

## 2022-04-12 ENCOUNTER — Other Ambulatory Visit: Payer: Self-pay | Admitting: *Deleted

## 2022-04-12 DIAGNOSIS — C50512 Malignant neoplasm of lower-outer quadrant of left female breast: Secondary | ICD-10-CM

## 2022-04-16 ENCOUNTER — Inpatient Hospital Stay: Payer: 59 | Attending: Hematology and Oncology

## 2022-04-16 ENCOUNTER — Inpatient Hospital Stay (HOSPITAL_BASED_OUTPATIENT_CLINIC_OR_DEPARTMENT_OTHER): Payer: 59 | Admitting: Hematology and Oncology

## 2022-04-16 ENCOUNTER — Encounter: Payer: Self-pay | Admitting: Hematology and Oncology

## 2022-04-16 VITALS — BP 151/83 | HR 53 | Temp 97.9°F | Resp 16 | Ht 66.0 in | Wt 186.8 lb

## 2022-04-16 DIAGNOSIS — E119 Type 2 diabetes mellitus without complications: Secondary | ICD-10-CM | POA: Insufficient documentation

## 2022-04-16 DIAGNOSIS — C50512 Malignant neoplasm of lower-outer quadrant of left female breast: Secondary | ICD-10-CM | POA: Insufficient documentation

## 2022-04-16 DIAGNOSIS — Z9221 Personal history of antineoplastic chemotherapy: Secondary | ICD-10-CM | POA: Diagnosis not present

## 2022-04-16 DIAGNOSIS — E785 Hyperlipidemia, unspecified: Secondary | ICD-10-CM | POA: Insufficient documentation

## 2022-04-16 DIAGNOSIS — Z170 Estrogen receptor positive status [ER+]: Secondary | ICD-10-CM | POA: Insufficient documentation

## 2022-04-16 DIAGNOSIS — C773 Secondary and unspecified malignant neoplasm of axilla and upper limb lymph nodes: Secondary | ICD-10-CM | POA: Diagnosis not present

## 2022-04-16 DIAGNOSIS — D509 Iron deficiency anemia, unspecified: Secondary | ICD-10-CM | POA: Diagnosis not present

## 2022-04-16 DIAGNOSIS — Z79899 Other long term (current) drug therapy: Secondary | ICD-10-CM | POA: Insufficient documentation

## 2022-04-16 DIAGNOSIS — I10 Essential (primary) hypertension: Secondary | ICD-10-CM | POA: Insufficient documentation

## 2022-04-16 DIAGNOSIS — Z923 Personal history of irradiation: Secondary | ICD-10-CM | POA: Insufficient documentation

## 2022-04-16 DIAGNOSIS — Z7984 Long term (current) use of oral hypoglycemic drugs: Secondary | ICD-10-CM | POA: Insufficient documentation

## 2022-04-16 DIAGNOSIS — Z801 Family history of malignant neoplasm of trachea, bronchus and lung: Secondary | ICD-10-CM | POA: Diagnosis not present

## 2022-04-16 LAB — CBC WITH DIFFERENTIAL (CANCER CENTER ONLY)
Abs Immature Granulocytes: 0.02 10*3/uL (ref 0.00–0.07)
Basophils Absolute: 0.1 10*3/uL (ref 0.0–0.1)
Basophils Relative: 1 %
Eosinophils Absolute: 0.2 10*3/uL (ref 0.0–0.5)
Eosinophils Relative: 3 %
HCT: 40.2 % (ref 36.0–46.0)
Hemoglobin: 13.5 g/dL (ref 12.0–15.0)
Immature Granulocytes: 0 %
Lymphocytes Relative: 29 %
Lymphs Abs: 2.1 10*3/uL (ref 0.7–4.0)
MCH: 31.1 pg (ref 26.0–34.0)
MCHC: 33.6 g/dL (ref 30.0–36.0)
MCV: 92.6 fL (ref 80.0–100.0)
Monocytes Absolute: 0.6 10*3/uL (ref 0.1–1.0)
Monocytes Relative: 9 %
Neutro Abs: 4.2 10*3/uL (ref 1.7–7.7)
Neutrophils Relative %: 58 %
Platelet Count: 203 10*3/uL (ref 150–400)
RBC: 4.34 MIL/uL (ref 3.87–5.11)
RDW: 13.3 % (ref 11.5–15.5)
WBC Count: 7.2 10*3/uL (ref 4.0–10.5)
nRBC: 0 % (ref 0.0–0.2)

## 2022-04-16 LAB — CMP (CANCER CENTER ONLY)
ALT: 18 U/L (ref 0–44)
AST: 23 U/L (ref 15–41)
Albumin: 4.5 g/dL (ref 3.5–5.0)
Alkaline Phosphatase: 68 U/L (ref 38–126)
Anion gap: 5 (ref 5–15)
BUN: 15 mg/dL (ref 6–20)
CO2: 31 mmol/L (ref 22–32)
Calcium: 9.6 mg/dL (ref 8.9–10.3)
Chloride: 101 mmol/L (ref 98–111)
Creatinine: 0.92 mg/dL (ref 0.44–1.00)
GFR, Estimated: >60 mL/min (ref >60)
Glucose, Bld: 94 mg/dL (ref 70–99)
Potassium: 3.8 mmol/L (ref 3.5–5.1)
Sodium: 137 mmol/L (ref 135–145)
Total Bilirubin: 0.6 mg/dL (ref 0.3–1.2)
Total Protein: 8 g/dL (ref 6.5–8.1)

## 2022-04-16 NOTE — Progress Notes (Signed)
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 She continues under observation. She denies any new complaints. She is doing quite well overall.  She denies any changes in breast. No changes in breathing, bowel and urinary habits Rest of the pertinent 10 point ROS reviewed and negative.    COVID 19 VACCINATION STATUS: [place] x3, most recently 09/2020   HISTORY OF CURRENT ILLNESS: From the original intake note:  "Lindsey Wright" palpated a mass in the left breast about 2 weeks ago. She followed up with her gynecologist, Dr. Jean Wright who referred her to the [facility] for mammography. She underwent unilateral left diagnostic mammography with tomography and left breast ultrasonography at [facility] on 12/20/2017 showing: breast density category B. Suspicious newly palpable left breast mass at the 5 o'clock lower outer position with internal blow flow measuring 2.3 x 1.8 x 2.5 cm. There are either 2 adjacent masses or a single complicated mass at the 0:10 lower outer position, 4 cm from the nipple measuring 1.8 x 0.5 by 0.9 cm which may represent fibrocystic changes versus a solid mass. No other suspicious findings. Ultrasound showed normal axillary nodes.  Accordingly on 12/23/2017 she proceeded to biopsy of the left breast areas in question. The pathology from this procedure  showed ([id number]): At both the 5 o'clock spanning 1.2 cm and 3:30 position spanning 0.7 cm, invasive ductal carcinoma, grade III. Prognostic indicators significant for: estrogen receptor, 10% positive with weak staining intensity and progesterone receptor, 0% negative. Proliferation marker Ki67 at 90%. HER2 amplified with ratios ER2/CEP17 signals 2.33 and average HER2 copies per cell 4.65  The patient's subsequent history is as detailed below.   PAST MEDICAL HISTORY: Past Medical History:  Diagnosis Date   Anemia    iron deficient anemia   Breast cancer ([place])    left   Diabetes mellitus without complication ([place])    History of Bell's palsy    x 2 as teenager and in 20's, last 2016   Hyperlipidemia    on meds   Hypertension    Menorrhagia    Overweight(278.02)    Peripheral edema    Personal history of chemotherapy    Personal history of radiation therapy     PAST SURGICAL HISTORY: Past Surgical History:  Procedure Laterality Date   BREAST BIOPSY Left 12/2017   BREAST BIOPSY Left 02/26/2020   BREAST LUMPECTOMY Left 2019   BREAST LUMPECTOMY WITH RADIOACTIVE SEED AND SENTINEL LYMPH NODE BIOPSY Left 06/16/2018   Procedure: LEFT BREAST LUMPECTOMY WITH RADIOACTIVE SEED X'S 2 WITH SEED TARGETED LYMPH NODE EXCISION AND SENTINEL LYMPH NODE BIOPSY;  Surgeon: Lindsey Kussmaul, MD;  Location: [facility];  Service: General;  Laterality: Left;   CESAREAN SECTION CLASSICAL     x2   CHOLECYSTECTOMY     COLONOSCOPY     POLYPECTOMY     PORT-A-CATH REMOVAL Right 10/30/2018   Procedure: REMOVAL PORT-A-CATH;  Surgeon: Lindsey Kussmaul, MD;  Location: [facility];  Service: General;  Laterality: Right;   PORTACATH PLACEMENT Right 01/17/2018   Procedure: INSERTION PORT-A-CATH;  Surgeon: Lindsey Kussmaul, MD;  Location: [facility];  Service: General;  Laterality: Right;   TUBAL LIGATION      FAMILY HISTORY Family History  Problem Relation Age of Onset    Hypertension Mother    Diabetes Mother    Stroke Mother    Alcohol abuse Father    Cancer Father        lung ca   Hypertension Father    Hypertension Sister    Colon cancer Neg Hx    Colon polyps Neg Hx    Rectal cancer Neg Hx    Stomach cancer Neg Hx    Esophageal cancer Neg Hx   The patient's father was diagnosed with lung cancer at age 56 and died the same year. The patient's mother died at age 56 due to several strokes. The patient had 1 brother who died due to strokes and possibly a MI. The patient has 1 sister. She denies a history of breast or ovarian cancer in the family.    GYNECOLOGIC HISTORY:  Patient's last menstrual period was 06/21/2008 (approximate). Menarche: 56 years old Age at first live birth: 56 years old The patient is GXP2. The patient is not having periods, with her LMP being in 2010. She used oral contraceptive for about 10 years with no complications. She never used HRT.    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  She retired in 2021, went back briefly to work and then we retired in June 2022.  Her husband, Lindsey Wright, works part time in Therapist, art.  The patient's older daughter Lindsey Wright age works for con in the telemetry department and also in orthopedics.  She is getting married in 2022 and will start training and physical therapy January 2023.  She the patient's daughter Lindsey Wright is a senior this year at [employer]  She hopes to become an Automotive engineer.  Both of the patient's daughters live with her.     ADVANCED DIRECTIVES: In the absence of any documentation to the contrary, the patient's spouse is their HCPOA.    HEALTH MAINTENANCE: Social History   Tobacco Use   Smoking status: Never   Smokeless tobacco: Never  Substance Use Topics   Alcohol use: No    Alcohol/week: 0.0 standard drinks of alcohol   Drug use: No     Colonoscopy: 03/27/2017 polyp removal/ Dr. Deeann Saint  PAP: May 2017 normal  Bone density:   Allergies  Allergen Reactions   Ace Inhibitors Cough   Advil [Ibuprofen]     Hives     Current Outpatient Medications  Medication Sig Dispense Refill   Blood Glucose Monitoring Suppl (ONETOUCH VERIO FLEX SYSTEM) w/Device KIT To check glucose twice daily and prn for uncontrolled diabetes type 2 (E11.9) 1 kit 0   Calcium-Vitamin D-Vitamin K (VIACTIV PO) Take by mouth. 1 chew BID     COVID-19 mRNA Vac-TriS, Pfizer, (PFIZER-BIONT COVID-19 VAC-TRIS) SUSP injection Inject into the muscle. 0.3 mL 0   glucose blood (ONETOUCH VERIO) test strip TO CHECK GLUCOSE TWICE DAILY AND AS NEEDED FOR UNCONTROLLED DIABETES TYPE 2 (E11.9) 100 strip 3   Lancets (ONETOUCH ULTRASOFT) lancets To check glucose twice daily and prn for uncontrolled diabetes 2 (E11.9) 100 each 3   losartan (COZAAR) 50 MG tablet Take 1 tablet (50 mg total) by mouth  daily. 30 tablet 11   metFORMIN (GLUCOPHAGE) 500 MG tablet Take 1 tablet (500 mg total) by mouth 2 (two) times daily with a meal. 60 tablet 11   potassium chloride SA (KLOR-CON M20) 20 MEQ tablet Take 2 tablets (40 mEq total) by mouth daily. 60 tablet 11   rosuvastatin (CRESTOR) 10 MG tablet Take 1 tablet (10 mg total) by mouth daily. 30 tablet 11   triamterene-hydrochlorothiazide (MAXZIDE) 75-50 MG tablet Take 0.5 tablets by mouth daily. 15 tablet 11   No current facility-administered medications for this visit.    OBJECTIVE: African-American woman who appears well  There were no vitals filed for this visit.    There is no height or weight on file to calculate BMI.   Wt Readings from Last 3 Encounters:  04/04/22 184 lb (83.5 kg)  03/07/22 188 lb (85.3 kg)  09/19/21 184 lb 12.8 oz (83.8 kg)  ECOG FS:1  Physical Exam Constitutional:      Appearance: Normal appearance.  Cardiovascular:     Rate and Rhythm: Normal rate and regular rhythm.     Pulses: Normal pulses.     Heart sounds: Normal heart sounds.  Pulmonary:      Effort: Pulmonary effort is normal.     Breath sounds: Normal breath sounds.  Chest:     Comments: Breast: Bilateral breasts inspected. No palpable masses or regional adenopathy Post surgical changes and radiation changes noted left breast. Musculoskeletal:        General: No swelling or tenderness. Normal range of motion.     Cervical back: Normal range of motion and neck supple. No rigidity.  Lymphadenopathy:     Cervical: No cervical adenopathy.  Skin:    General: Skin is warm and dry.  Neurological:     General: No focal deficit present.     Mental Status: She is alert.       LAB RESULTS:  CMP     Component Value Date/Time   NA 137 09/19/2021 1120   K 3.8 09/19/2021 1120   CL 100 09/19/2021 1120   CO2 28 09/19/2021 1120   GLUCOSE 94 09/19/2021 1120   BUN 15 09/19/2021 1120   CREATININE 0.80 09/19/2021 1120   CREATININE 0.87 10/07/2018 0858   CALCIUM 9.4 09/19/2021 1120   PROT 7.3 09/19/2021 1120   ALBUMIN 4.3 09/19/2021 1120   AST 24 09/19/2021 1120   AST 22 10/07/2018 0858   ALT 22 09/19/2021 1120   ALT 24 10/07/2018 0858   ALKPHOS 78 09/19/2021 1120   BILITOT 0.7 09/19/2021 1120   BILITOT 0.6 10/07/2018 0858   GFRNONAA >60 03/23/2021 0925   GFRNONAA >60 10/07/2018 0858   GFRAA >60 02/09/2020 1454   GFRAA >60 10/07/2018 0858    No results found for: "TOTALPROTELP", "ALBUMINELP", "A1GS", "A2GS", "BETS", "BETA2SER", "GAMS", "MSPIKE", "SPEI"  No results found for: "KPAFRELGTCHN", "LAMBDASER", "KAPLAMBRATIO"  Lab Results  Component Value Date   WBC 6.9 09/19/2021   NEUTROABS 4.5 09/19/2021   HGB 13.6 09/19/2021   HCT 40.8 09/19/2021   MCV 92.0 09/19/2021   PLT 200.0 09/19/2021   No results found for: "LABCA2"  No components found for: "OTLXBW620"  No results for input(s): "INR" in the last 168 hours.  No results found for: "LABCA2"  No results found for: "BTD974"  No results found for: "CAN125"  No results found for: "CAN153"  No  results found for: "CA2729"  No components found for: "HGQUANT"  No results found for: "CEA1", "CEA" / No  results found for: "CEA1", "CEA"   No results found for: "AFPTUMOR"  No results found for: "CHROMOGRNA"  No results found for: "HGBA", "HGBA2QUANT", "HGBFQUANT", "HGBSQUAN" (Hemoglobinopathy evaluation)   No results found for: "LDH"  Lab Results  Component Value Date   IRON 104 09/27/2008   IRONPCTSAT 25.1 09/27/2008   (Iron and TIBC)  No results found for: "FERRITIN"  Urinalysis    Component Value Date/Time   COLORURINE YELLOW 02/15/2018 2014   APPEARANCEUR CLEAR 02/15/2018 2014   [place] 1.011 02/15/2018 2014   [place] 7.0 02/15/2018 2014   [place] 02/15/2018 2014   [place] (A) 02/15/2018 2014   [place] NEGATIVE 02/15/2018 2014   [place] NEGATIVE 02/15/2018 2014   [place] NEGATIVE 02/15/2018 2014   [place] NEGATIVE 02/15/2018 2014   LEUKOCYTESUR NEGATIVE 02/15/2018 2014    STUDIES: No results found.   ELIGIBLE FOR AVAILABLE RESEARCH PROTOCOL: Participating in exact sciences blood draw study   ASSESSMENT: 56 y.o.  Ignacia Palma, [place] woman status post left breast lower outer quadrant biopsy 12/23/2017 for a clinically multifocal T2 N0, stage II invasive ductal carcinoma, grade 3, essentially estrogen receptor and progesterone receptor negative, but HER-2 amplified, with an MIB-1 of 90%.  (a) breast MRI 01/07/2018 shows the 2.6 cm main mass and 4 additional smaller masses spanning 7.1 cm; there was a suspicious left axillary lymph node  (b) left axillary lymph node biopsy 01/14/2018 was benign/discordant (no lymph node tissue)  (1) neoadjuvant chemotherapy consisting of carboplatin and docetaxel every 21 days for 6 cycles started 01/20/2018, completed 05/05/2018, given in conjunction with anti-HER-2 immunotherapy  (2) anti-HER-2 immunotherapy consisting of trastuzumab and Pertuzumab started 01/20/2018, continued for 6 months, last dose  07/29/2018  (a) pertuzumab discontinued after cycle 2 due to diarrhea  (b) baseline echocardiogram 01/08/2018 shows an ejection fraction in the 60-65% range  (c) echocardiogram on 04/24/2018 shows an ejection fraction in the 65-70% range  (d) echocardiogram 07/31/2018 showed an ejection fraction in the 55-60% range  (3) left lumpectomy and sentinel lymph node biopsy 10 07 2019 showed a residual ypT1a ypN0 invasive ductal carcinoma, grade 3, with a repeat prognostic panel now triple negative  (4) adjuvant radiation 07/28/2018 - 09/15/2018  Site/dose: The patient initially received a dose of 50.4 Gy in 28 fractions to the left breast using whole-breast tangent fields. This was delivered using a 3-D conformal technique. The patient then received a boost to the seroma. This delivered an additional 10 Gy in 5 fractions using 6X, 10X photons with a Complex Isodose technique. The total dose was 60.4 Gy.    PLAN:  Ms Bellamia is here for a follow up. She is doing quite well today. No concerns for recurrence. PE today unremarkable, no palpable masses or regional recurrence. She had a mammogram recently, 2 mm calcs noted in left breast, repeat mammo scheduled in 6 months per protocol.  She is otherwise up to date with age appropriate cancer screening. RTC in one yr or sooner as needed.  Total encounter time 30 minutes.*  *Total Encounter Time as defined by the Centers for Medicare and Medicaid Services includes, in addition to the face-to-face time of a patient visit (documented in the note above) non-face-to-face time: obtaining and reviewing outside history, ordering and reviewing medications, tests or procedures, care coordination (communications with other health care professionals or caregivers) and documentation in the medical record.

## 2022-05-08 ENCOUNTER — Encounter: Payer: Self-pay | Admitting: Gastroenterology

## 2022-08-29 ENCOUNTER — Encounter (HOSPITAL_COMMUNITY): Payer: Self-pay | Admitting: *Deleted

## 2022-08-29 ENCOUNTER — Ambulatory Visit (HOSPITAL_COMMUNITY)
Admission: EM | Admit: 2022-08-29 | Discharge: 2022-08-29 | Disposition: A | Discharge status: Home / Self Care | Payer: 59 | Attending: Nurse Practitioner | Admitting: Nurse Practitioner

## 2022-08-29 ENCOUNTER — Other Ambulatory Visit: Payer: Self-pay

## 2022-08-29 DIAGNOSIS — J029 Acute pharyngitis, unspecified: Secondary | ICD-10-CM

## 2022-08-29 DIAGNOSIS — H9202 Otalgia, left ear: Secondary | ICD-10-CM | POA: Diagnosis not present

## 2022-08-29 LAB — POC INFLUENZA A AND B ANTIGEN (URGENT CARE ONLY)
INFLUENZA A ANTIGEN, POC: NEGATIVE
INFLUENZA B ANTIGEN, POC: NEGATIVE

## 2022-08-29 LAB — POCT RAPID STREP A, ED / UC: Streptococcus, Group A Screen (Direct): NEGATIVE

## 2022-08-29 MED ORDER — OFLOXACIN 0.3 % OP SOLN: 1.0000 [drp] | Freq: Four times a day (QID) | OPHTHALMIC | 0 refills | Status: DC

## 2022-08-29 MED ORDER — LIDOCAINE VISCOUS HCL 2 % MT SOLN: 15.0000 mL | OROMUCOSAL | 0 refills | Status: DC | PRN

## 2022-08-29 NOTE — ED Provider Notes (Signed)
[facility]    CSN: [id number] Arrival date & time: 08/29/22  3474      History   Chief Complaint Chief Complaint  Patient presents with   Ear Pain   Sore Throat    HPI Lindsey Wright is a 56 y.o. female.   HPI  She is  in today with left ear pain and sore throat for several days. 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 She was wearing her mask. She is now having constant throat pain with difficulty swallowing. She had chills but denies fever. Her treatment has been IBU with no relief.  Denies headache, dizziness, nasal congestion, shortness of breath, dyspnea on exertion, chest pain, nausea, vomiting or any edema.   Past Medical History:  Diagnosis Date   Anemia    iron deficient anemia   Breast cancer ([place])    left   Diabetes mellitus without complication ([place])    History of Bell's palsy    x 2 as teenager and in 20's, last 2016   Hyperlipidemia    on meds   Hypertension    Menorrhagia    Overweight(278.02)    Peripheral edema    Personal history of chemotherapy    Personal history of radiation therapy     Patient Active Problem List   Diagnosis Date Noted   Post-viral cough syndrome 10/11/2020   Hyperlipidemia associated with type 2 diabetes mellitus ([place]) 10/04/2019   Low back pain 11/17/2018   Port-A-Cath in place 01/20/2018   Malignant neoplasm of lower-outer quadrant of 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, estrogen receptor positive ([place]) 12/26/2017   Colon cancer screening 01/29/2017   Controlled type 2 diabetes mellitus without complication, without long-term current use of insulin ([place]) 09/25/2013   Essential hypertension 08/15/2007    Past Surgical History:  Procedure Laterality Date   BREAST BIOPSY Left 12/2017   BREAST BIOPSY Left 02/26/2020   BREAST LUMPECTOMY Left 2019   BREAST LUMPECTOMY WITH RADIOACTIVE SEED AND SENTINEL LYMPH NODE BIOPSY Left 06/16/2018   Procedure: LEFT BREAST LUMPECTOMY WITH RADIOACTIVE SEED X'S 2  WITH SEED TARGETED LYMPH NODE EXCISION AND SENTINEL LYMPH NODE BIOPSY;  Surgeon: [name], MD;  Location: [place];  Service: General;  Laterality: Left;   CESAREAN SECTION CLASSICAL     x2   CHOLECYSTECTOMY     COLONOSCOPY     POLYPECTOMY     PORT-A-CATH REMOVAL Right 10/30/2018   Procedure: REMOVAL PORT-A-CATH;  Surgeon: [name], MD;  Location: [place];  Service: General;  Laterality: Right;   PORTACATH PLACEMENT Right 01/17/2018   Procedure: INSERTION PORT-A-CATH;  Surgeon: [name], MD;  Location: [place];  Service: General;  Laterality: Right;   TUBAL LIGATION      OB History   No obstetric history on file.      Home Medications    Prior to Admission medications   Medication Sig Start Date End Date Taking? Authorizing Provider  lidocaine (XYLOCAINE) 2 % solution Use as directed 15 mLs in the mouth or throat as needed for mouth pain. 08/29/22  Yes Vevelyn Francois, NP  losartan (COZAAR) 50 MG tablet Take 1 tablet (50 mg total) by mouth daily. 09/19/21  Yes Tower, Wynelle Fanny, MD  metFORMIN (GLUCOPHAGE) 500 MG tablet Take 1 tablet (500 mg total) by mouth 2 (two) times daily with a meal. 09/19/21  Yes Tower, Wynelle Fanny, MD  ofloxacin (OCUFLOX) 0.3 % ophthalmic solution Place  1 drop into the left eye 4 (four) times daily. 08/29/22  Yes Josedejesus Marcum, Diona Foley, NP  potassium chloride SA (KLOR-CON M20) 20 MEQ tablet Take 2 tablets (40 mEq total) by mouth daily. 09/19/21  Yes Tower, Wynelle Fanny, MD  rosuvastatin (CRESTOR) 10 MG tablet Take 1 tablet (10 mg total) by mouth daily. 09/19/21  Yes Tower, Wynelle Fanny, MD  triamterene-hydrochlorothiazide (MAXZIDE) 75-50 MG tablet Take 0.5 tablets by mouth daily. 09/19/21  Yes Tower, Wynelle Fanny, MD  Blood Glucose Monitoring Suppl ([place]) w/Device KIT To check glucose twice daily and prn for uncontrolled diabetes type 2 (E11.9) 10/02/19   Tower, Wynelle Fanny, MD  Calcium-Vitamin D-Vitamin K  (VIACTIV PO) Take by mouth. 1 chew BID    [provider]  COVID-19 mRNA Vac-TriS, Pfizer, (PFIZER-BIONT COVID-19 VAC-TRIS) SUSP injection Inject into the muscle. 04/04/21   Carlyle Basques, MD  glucose blood (ONETOUCH VERIO) test strip TO CHECK GLUCOSE TWICE DAILY AND AS NEEDED FOR UNCONTROLLED DIABETES TYPE 2 (E11.9) 02/28/21   Tower, Wynelle Fanny, MD  Lancets [facility] ULTRASOFT) lancets To check glucose twice daily and prn for uncontrolled diabetes 2 (E11.9) 10/02/19   Tower, Wynelle Fanny, MD    Family History Family History  Problem Relation Age of Onset   Hypertension Mother    Diabetes Mother    Stroke Mother    Alcohol abuse Father    Cancer Father        lung ca   Hypertension Father    Hypertension Sister    Colon cancer Neg Hx    Colon polyps Neg Hx    Rectal cancer Neg Hx    Stomach cancer Neg Hx    Esophageal cancer Neg Hx     Social History Social History   Tobacco Use   Smoking status: Never   Smokeless tobacco: Never  Vaping Use   Vaping Use: Never used  Substance Use Topics   Alcohol use: No    Alcohol/week: 0.0 standard drinks of alcohol   Drug use: No     Allergies   Ace inhibitors and Advil [ibuprofen]   Review of Systems Review of Systems   Physical Exam Triage Vital Signs ED Triage Vitals  Enc Vitals Group     BP 08/29/22 0849 (!) 148/87     Pulse Rate 08/29/22 0849 82     Resp 08/29/22 0849 18     Temp 08/29/22 0849 98.8 F (37.1 C)     Temp Source 08/29/22 0849 Oral     SpO2 08/29/22 0849 98 %     Weight --      Height --      Head Circumference --      Peak Flow --      Pain Score 08/29/22 0851 4     Pain Loc --      Pain Edu? --      Excl. in [place]? --    No data found.  Updated Vital Signs BP (!) 148/87   Pulse 82   Temp 98.8 F (37.1 C) (Oral)   Resp 18   LMP 06/21/2008 (Approximate)   SpO2 98%   Visual Acuity Right Eye Distance:   Left Eye Distance:   Bilateral Distance:    Right Eye Near:   Left Eye Near:     Bilateral Near:     Physical Exam Constitutional:      Appearance: She is obese.  HENT:     Head: Normocephalic and atraumatic.  Right Ear: Tympanic membrane normal.     Left Ear: Tympanic membrane normal.     Nose: No congestion or rhinorrhea.     Mouth/Throat:     Mouth: Mucous membranes are moist.     Pharynx: Posterior oropharyngeal erythema present.     Tonsils: No tonsillar exudate. 1+ on the right. 1+ on the left.  Neck:     Comments: thickness Cardiovascular:     Rate and Rhythm: Normal rate.     Heart sounds: Normal heart sounds.  Pulmonary:     Effort: Pulmonary effort is normal.     Breath sounds: Normal breath sounds.  Musculoskeletal:     Cervical back: Normal range of motion.  Skin:    General: Skin is warm and dry.     Capillary Refill: Capillary refill takes less than 2 seconds.  Neurological:     General: No focal deficit present.     Mental Status: She is alert and oriented to person, place, and time.  Psychiatric:        Mood and Affect: Mood normal.        Behavior: Behavior normal.      UC Treatments / Results  Labs (all labs ordered are listed, but only abnormal results are displayed) Labs Reviewed  POC INFLUENZA A AND B ANTIGEN (URGENT CARE ONLY)  POCT RAPID STREP A, ED / UC    EKG   Radiology No results found.  Procedures Procedures (including critical care time)  Medications Ordered in UC Medications - No data to display  Initial Impression / Assessment and Plan / UC Course  I have reviewed the triage vital signs and the nursing notes.  Pertinent labs & imaging results that were available during my care of the patient were reviewed by me and considered in my medical decision making (see chart for details).    Ear pain Throat pain Final Clinical Impressions(s) / UC Diagnoses   Final diagnoses:  Sore throat  Ear pain, left  Pharyngitis, unspecified etiology     Discharge Instructions      Your Influenza and Strep  Test are all negative. I have provide you with some ear drops for your left ear. Ofloxacin 0.3% for the ear pain and this will also treat any infection.  We encourage conservative treatment with symptom relief. We encourage you to use Tylenol for your pain (Remember to use as directed do not exceed daily dosing recommendations) We also encourage salt water gargles for your sore throat. You should also consider throat lozenges and chloraseptic spray.  We have prescribed you a lidocaine 2 % solution for the sore throat which you can swish and sip. Be careful not to eat within one hour of using the lidocaine solution so that you do not bit your tongue or get choked.       ED Prescriptions     Medication Sig Dispense Auth. Provider   lidocaine (XYLOCAINE) 2 % solution Use as directed 15 mLs in the mouth or throat as needed for mouth pain. 100 mL Vevelyn Francois, NP   ofloxacin (OCUFLOX) 0.3 % ophthalmic solution Place 1 drop into the left eye 4 (four) times daily. 5 mL Vevelyn Francois, NP      PDMP not reviewed this encounter.   Dionisio David [place], [place] 08/29/22 1036

## 2022-08-29 NOTE — ED Triage Notes (Signed)
C/O starting 4 days ago with painful swallowing. The following day started with left ear pain. Also c/o slight cough and slight runny nose. Denies fevers. Has taken IBU (last dose yesterday).

## 2022-08-29 NOTE — Discharge Instructions (Addendum)
Your Influenza and Strep Test are all negative. I have provide you with some ear drops for your left ear. Ofloxacin 0.3% for the ear pain and this will also treat any infection.  We encourage conservative treatment with symptom relief. We encourage you to use Tylenol for your pain (Remember to use as directed do not exceed daily dosing recommendations) We also encourage salt water gargles for your sore throat. You should also consider throat lozenges and chloraseptic spray.  We have prescribed you a lidocaine 2 % solution for the sore throat which you can swish and sip. Be careful not to eat within one hour of using the lidocaine solution so that you do not bit your tongue or get choked.

## 2022-09-11 ENCOUNTER — Ambulatory Visit
Admission: RE | Admit: 2022-09-11 | Discharge: 2022-09-11 | Disposition: A | Discharge status: Home / Self Care | Payer: 59 | Source: Ambulatory Visit | Attending: Obstetrics & Gynecology | Admitting: Obstetrics & Gynecology

## 2022-09-11 ENCOUNTER — Other Ambulatory Visit: Payer: Self-pay | Admitting: Hematology and Oncology

## 2022-09-11 DIAGNOSIS — Z170 Estrogen receptor positive status [ER+]: Secondary | ICD-10-CM

## 2022-09-11 DIAGNOSIS — R921 Mammographic calcification found on diagnostic imaging of breast: Secondary | ICD-10-CM

## 2022-10-02 ENCOUNTER — Telehealth: Payer: Self-pay | Admitting: Family Medicine

## 2022-10-02 DIAGNOSIS — I10 Essential (primary) hypertension: Secondary | ICD-10-CM

## 2022-10-02 DIAGNOSIS — E119 Type 2 diabetes mellitus without complications: Secondary | ICD-10-CM

## 2022-10-02 DIAGNOSIS — E1169 Type 2 diabetes mellitus with other specified complication: Secondary | ICD-10-CM

## 2022-10-02 NOTE — Telephone Encounter (Signed)
-----  Message from Velna Hatchet, RT sent at 09/24/2022  2:29 PM EST ----- Regarding: Wed 1/24 lab Patient is scheduled for cpx, please order future labs.  Thanks, Anda Kraft

## 2022-10-03 ENCOUNTER — Other Ambulatory Visit (INDEPENDENT_AMBULATORY_CARE_PROVIDER_SITE_OTHER): Payer: 59

## 2022-10-03 DIAGNOSIS — E785 Hyperlipidemia, unspecified: Secondary | ICD-10-CM | POA: Diagnosis not present

## 2022-10-03 DIAGNOSIS — E1169 Type 2 diabetes mellitus with other specified complication: Secondary | ICD-10-CM | POA: Diagnosis not present

## 2022-10-03 DIAGNOSIS — E119 Type 2 diabetes mellitus without complications: Secondary | ICD-10-CM

## 2022-10-03 DIAGNOSIS — I10 Essential (primary) hypertension: Secondary | ICD-10-CM

## 2022-10-03 LAB — CBC WITH DIFFERENTIAL/PLATELET
Basophils Absolute: 0 10*3/uL (ref 0.0–0.1)
Basophils Relative: 0.3 % (ref 0.0–3.0)
Eosinophils Absolute: 0.2 10*3/uL (ref 0.0–0.7)
Eosinophils Relative: 2.2 % (ref 0.0–5.0)
HCT: 39.7 % (ref 36.0–46.0)
Hemoglobin: 13.4 g/dL (ref 12.0–15.0)
Lymphocytes Relative: 30.7 % (ref 12.0–46.0)
Lymphs Abs: 2.3 10*3/uL (ref 0.7–4.0)
MCHC: 33.7 g/dL (ref 30.0–36.0)
MCV: 92.6 fl (ref 78.0–100.0)
Monocytes Absolute: 0.5 10*3/uL (ref 0.1–1.0)
Monocytes Relative: 6.6 % (ref 3.0–12.0)
Neutro Abs: 4.6 10*3/uL (ref 1.4–7.7)
Neutrophils Relative %: 60.2 % (ref 43.0–77.0)
Platelets: 202 10*3/uL (ref 150.0–400.0)
RBC: 4.29 Mil/uL (ref 3.87–5.11)
RDW: 13.9 % (ref 11.5–15.5)
WBC: 7.6 10*3/uL (ref 4.0–10.5)

## 2022-10-03 LAB — COMPREHENSIVE METABOLIC PANEL
ALT: 17 U/L (ref 0–35)
AST: 20 U/L (ref 0–37)
Albumin: 4.4 g/dL (ref 3.5–5.2)
Alkaline Phosphatase: 66 U/L (ref 39–117)
BUN: 18 mg/dL (ref 6–23)
CO2: 28 mEq/L (ref 19–32)
Calcium: 9.5 mg/dL (ref 8.4–10.5)
Chloride: 101 mEq/L (ref 96–112)
Creatinine, Ser: 0.8 mg/dL (ref 0.40–1.20)
GFR: 82.54 mL/min (ref >60.00)
Glucose, Bld: 88 mg/dL (ref 70–99)
Potassium: 3.6 mEq/L (ref 3.5–5.1)
Sodium: 138 mEq/L (ref 135–145)
Total Bilirubin: 0.6 mg/dL (ref 0.2–1.2)
Total Protein: 7.3 g/dL (ref 6.0–8.3)

## 2022-10-03 LAB — MICROALBUMIN / CREATININE URINE RATIO
Creatinine,U: 84.6 mg/dL
Microalb Creat Ratio: 1.6 mg/g (ref 0.0–30.0)
Microalb, Ur: 1.3 mg/dL (ref 0.0–1.9)

## 2022-10-03 LAB — LIPID PANEL
Cholesterol: 107 mg/dL (ref 0–200)
HDL: 44.7 mg/dL (ref >39.00)
LDL Cholesterol: 45 mg/dL (ref 0–99)
NonHDL: 62.4
Total CHOL/HDL Ratio: 2
Triglycerides: 85 mg/dL (ref 0.0–149.0)
VLDL: 17 mg/dL (ref 0.0–40.0)

## 2022-10-03 LAB — TSH: TSH: 1.59 u[IU]/mL (ref 0.35–5.50)

## 2022-10-03 LAB — HEMOGLOBIN A1C: Hgb A1c MFr Bld: 6.3 % (ref 4.6–6.5)

## 2022-10-09 ENCOUNTER — Other Ambulatory Visit: Payer: Self-pay | Admitting: Family Medicine

## 2022-10-10 ENCOUNTER — Ambulatory Visit (INDEPENDENT_AMBULATORY_CARE_PROVIDER_SITE_OTHER): Payer: 59 | Admitting: Family Medicine

## 2022-10-10 ENCOUNTER — Encounter: Payer: Self-pay | Admitting: Family Medicine

## 2022-10-10 VITALS — BP 134/86 | HR 70 | Temp 97.3°F | Ht 66.0 in | Wt 183.2 lb

## 2022-10-10 DIAGNOSIS — Z0000 Encounter for general adult medical examination without abnormal findings: Secondary | ICD-10-CM

## 2022-10-10 DIAGNOSIS — E1169 Type 2 diabetes mellitus with other specified complication: Secondary | ICD-10-CM

## 2022-10-10 DIAGNOSIS — Z1211 Encounter for screening for malignant neoplasm of colon: Secondary | ICD-10-CM

## 2022-10-10 DIAGNOSIS — I10 Essential (primary) hypertension: Secondary | ICD-10-CM

## 2022-10-10 DIAGNOSIS — Z23 Encounter for immunization: Secondary | ICD-10-CM

## 2022-10-10 DIAGNOSIS — E119 Type 2 diabetes mellitus without complications: Secondary | ICD-10-CM | POA: Diagnosis not present

## 2022-10-10 DIAGNOSIS — E785 Hyperlipidemia, unspecified: Secondary | ICD-10-CM

## 2022-10-10 MED ORDER — METFORMIN HCL 500 MG PO TABS: 500.0000 mg | ORAL_TABLET | Freq: Two times a day (BID) | ORAL | 11 refills | Status: DC

## 2022-10-10 MED ORDER — TRIAMTERENE-HCTZ 75-50 MG PO TABS: 0.5000 | ORAL_TABLET | Freq: Every day | ORAL | 11 refills | Status: DC

## 2022-10-10 MED ORDER — POTASSIUM CHLORIDE CRYS ER 20 MEQ PO TBCR: 40.0000 meq | EXTENDED_RELEASE_TABLET | Freq: Every day | ORAL | 11 refills | Status: DC

## 2022-10-10 MED ORDER — ROSUVASTATIN CALCIUM 10 MG PO TABS: 10.0000 mg | ORAL_TABLET | Freq: Every day | ORAL | 11 refills | Status: DC

## 2022-10-10 MED ORDER — LOSARTAN POTASSIUM 50 MG PO TABS: 50.0000 mg | ORAL_TABLET | Freq: Every day | ORAL | 11 refills | Status: DC

## 2022-10-10 NOTE — Assessment & Plan Note (Signed)
bp in fair control at this time  BP Readings from Last 1 Encounters:  10/10/22 134/86   No changes needed Most recent labs reviewed  Disc lifstyle change with low sodium diet and exercise  Plan to continue losartan 50 mg daily and triam hct  75-50 mg half pill daily

## 2022-10-10 NOTE — Assessment & Plan Note (Signed)
Reviewed health habits including diet and exercise and skin cancer prevention Reviewed appropriate screening tests for age  Also reviewed health mt list, fam hx and immunization status , as well as social and family history   See HPI Labs reviewed  Declines shingrix vaccines  Td updated  Mammogram utd/ in setting of past breast cancer  Eye exam utd Pap utd 06/2021 from gyn  Colonoscopy 03/2022 with 3 y recall for polyps

## 2022-10-10 NOTE — Progress Notes (Signed)
Subjective:    Patient ID: Lindsey Wright, female    DOB: [birth year], 57 y.o.   MRN: [id number]  HPI Here for health maintenance exam and to review chronic medical problems    Wt Readings from Last 3 Encounters:  10/10/22 183 lb 4 oz (83.1 kg)  04/16/22 186 lb 12.8 oz (84.7 kg)  04/04/22 184 lb (83.5 kg)   29.58 kg/m Vitals:   10/10/22 0838  BP: 134/86  Pulse: 70  Temp: (!) 97.3 F (36.3 C)  SpO2: 97%   Doing well   Has a cruise planned at the end of feb   Feeling pretty good   Walking for exercise  Has an exercise bike and it is hard to motivate  Would rather go outside   Does a program with video for strength training with a ball    Immunization History  Administered Date(s) Administered   Fluad Quad(high Dose 65+) 06/19/2022   Influenza,inj,Quad PF,6+ Mos 06/04/2019, 06/29/2020, 08/25/2021   PFIZER Comirnaty(Gray Top)Covid-19 Tri-Sucrose Vaccine 04/04/2021   PFIZER(Purple Top)SARS-COV-2 Vaccination 12/17/2019, 09/22/2020   Tdap 09/15/2012   Unspecified SARS-COV-2 Vaccination 12/17/2019, 01/11/2020   Health Maintenance Due  Topic Date Due   Hepatitis C Screening  Never done   Zoster Vaccines- Shingrix (1 of 2) Never done   COVID-19 Vaccine (6 - 2023-24 season) 05/11/2022   DTaP/Tdap/Td (2 - Td or Tdap) 09/15/2022   FOOT EXAM  09/19/2022   Shingrix: declines   Tdap  09/2012  Mammogram 09/2022 and nl , gets them every 48moPersonal h/o breast cancer  Self breast exam: no lumps or changes   Eye exam 01/2022  Pap 06/2021  negative  Sees gyn yearly /no problems   Colonoscopy 03/2022 with 3 y recall for polyps   HTN bp is stable today  No cp or palpitations or headaches or edema  No side effects to medicines  BP Readings from Last 3 Encounters:  10/10/22 134/86  08/29/22 (!) 148/87  04/16/22 (!) 151/83    Pulse Readings from Last 3 Encounters:  10/10/22 70  08/29/22 82  04/16/22 (!) 53   Losartan 50 mg daily  Triam hct 75-50 mg  daily  Lab Results  Component Value Date   CREATININE 0.80 10/03/2022   BUN 18 10/03/2022   NA 138 10/03/2022   K 3.6 10/03/2022   CL 101 10/03/2022   CO2 28 10/03/2022  GFR 82.5   DM2 Lab Results  Component Value Date   HGBA1C 6.3 10/03/2022   Up a little from 5.8  Eye exam utd   Arb Statin  Ok with eating  Could do better- she snacks after  Good about fruit and veg and protein    Metformin 500 mg bid   Cholesterol Lab Results  Component Value Date   CHOL 107 10/03/2022   CHOL 103 09/19/2021   CHOL 96 08/25/2020   Lab Results  Component Value Date   HDL 44.70 10/03/2022   HDL 45.00 09/19/2021   HDL 41.30 08/25/2020   Lab Results  Component Value Date   LDLCALC 45 10/03/2022   LDLCALC 45 09/19/2021   LDLCALC 43 08/25/2020   Lab Results  Component Value Date   TRIG 85.0 10/03/2022   TRIG 65.0 09/19/2021   TRIG 62.0 08/25/2020   Lab Results  Component Value Date   CHOLHDL 2 10/03/2022   CHOLHDL 2 09/19/2021   CHOLHDL 2 08/25/2020   No results found for: "LDLDIRECT" Good control with crestor   Lab  Results  Component Value Date   MICROALBUR 1.3 10/03/2022   Other labs Lab Results  Component Value Date   ALT 17 10/03/2022   AST 20 10/03/2022   ALKPHOS 66 10/03/2022   BILITOT 0.6 10/03/2022   Lab Results  Component Value Date   WBC 7.6 10/03/2022   HGB 13.4 10/03/2022   HCT 39.7 10/03/2022   MCV 92.6 10/03/2022   PLT 202.0 10/03/2022   Lab Results  Component Value Date   TSH 1.59 10/03/2022   Patient Active Problem List   Diagnosis Date Noted   Routine general medical examination at a health care facility 10/10/2022   Hyperlipidemia associated with type 2 diabetes mellitus ([place]) 10/04/2019   Low back pain 11/17/2018   Malignant neoplasm of lower-outer quadrant of 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, estrogen receptor positive ([place]) 12/26/2017   Colon cancer screening 01/29/2017   Controlled type 2 diabetes mellitus without complication,  without long-term current use of insulin ([place]) 09/25/2013   Essential hypertension 08/15/2007   Past Medical History:  Diagnosis Date   Anemia    iron deficient anemia   Breast cancer ([place])    left   Diabetes mellitus without complication ([place])    History of Bell's palsy    x 2 as teenager and in 20's, last 2016   Hyperlipidemia    on meds   Hypertension    Menorrhagia    Overweight(278.02)    Peripheral edema    Personal history of chemotherapy    Personal history of radiation therapy    Past Surgical History:  Procedure Laterality Date   BREAST BIOPSY Left 12/2017   BREAST BIOPSY Left 02/26/2020   BREAST LUMPECTOMY Left 2019   BREAST LUMPECTOMY WITH RADIOACTIVE SEED AND SENTINEL LYMPH NODE BIOPSY Left 06/16/2018   Procedure: LEFT BREAST LUMPECTOMY WITH RADIOACTIVE SEED X'S 2 WITH SEED TARGETED LYMPH NODE EXCISION AND SENTINEL LYMPH NODE BIOPSY;  Surgeon: [name], MD;  Location: [place];  Service: General;  Laterality: Left;   CESAREAN SECTION CLASSICAL     x2   CHOLECYSTECTOMY     COLONOSCOPY     POLYPECTOMY     PORT-A-CATH REMOVAL Right 10/30/2018   Procedure: REMOVAL PORT-A-CATH;  Surgeon: [name], MD;  Location: [place];  Service: General;  Laterality: Right;   PORTACATH PLACEMENT Right 01/17/2018   Procedure: INSERTION PORT-A-CATH;  Surgeon: [name], MD;  Location: [place];  Service: General;  Laterality: Right;   TUBAL LIGATION     Social History   Tobacco Use   Smoking status: Never   Smokeless tobacco: Never  Vaping Use   Vaping Use: Never used  Substance Use Topics   Alcohol use: No    Alcohol/week: 0.0 standard drinks of alcohol   Drug use: No   Family History  Problem Relation Age of Onset   Hypertension Mother    Diabetes Mother    Stroke Mother    Alcohol abuse Father    Cancer Father        lung ca   Hypertension Father    Hypertension Sister    Colon cancer Neg Hx     Colon polyps Neg Hx    Rectal cancer Neg Hx    Stomach cancer Neg Hx    Esophageal cancer Neg Hx    Allergies  Allergen Reactions   Ace Inhibitors Cough   Advil [Ibuprofen]     Hives - states able to tolerate '200mg'$   pill without any sxs    Current Outpatient Medications on File Prior to Visit  Medication Sig Dispense Refill   Blood Glucose Monitoring Suppl ([place]) w/Device KIT To check glucose twice daily and prn for uncontrolled diabetes type 2 (E11.9) 1 kit 0   Calcium-Vitamin D-Vitamin K (VIACTIV PO) Take by mouth. 1 chew BID     glucose blood (ONETOUCH VERIO) test strip TO CHECK GLUCOSE TWICE DAILY AND AS NEEDED FOR UNCONTROLLED DIABETES TYPE 2 (E11.9) 100 strip 3   Lancets (ONETOUCH ULTRASOFT) lancets To check glucose twice daily and prn for uncontrolled diabetes 2 (E11.9) 100 each 3   No current facility-administered medications on file prior to visit.     Review of Systems  Constitutional:  Negative for activity change, appetite change, fatigue, fever and unexpected weight change.  HENT:  Negative for congestion, ear pain, rhinorrhea, sinus pressure and sore throat.   Eyes:  Negative for pain, redness and visual disturbance.  Respiratory:  Negative for cough, shortness of breath and wheezing.   Cardiovascular:  Negative for chest pain and palpitations.  Gastrointestinal:  Negative for abdominal pain, blood in stool, constipation and diarrhea.  Endocrine: Negative for polydipsia and polyuria.  Genitourinary:  Negative for dysuria, frequency and urgency.  Musculoskeletal:  Negative for arthralgias, back pain and myalgias.  Skin:  Negative for pallor and rash.  Allergic/Immunologic: Negative for environmental allergies.  Neurological:  Negative for dizziness, syncope and headaches.  Hematological:  Negative for adenopathy. Does not bruise/bleed easily.  Psychiatric/Behavioral:  Negative for decreased concentration and dysphoric mood. The patient is not  nervous/anxious.        Objective:   Physical Exam Constitutional:      General: She is not in acute distress.    Appearance: Normal appearance. She is well-developed. She is obese. She is not ill-appearing or diaphoretic.  HENT:     Head: Normocephalic and atraumatic.     Right Ear: Tympanic membrane, ear canal and external ear normal.     Left Ear: Tympanic membrane, ear canal and external ear normal.     Nose: Nose normal. No congestion.     Mouth/Throat:     Mouth: Mucous membranes are moist.     Pharynx: Oropharynx is clear. No posterior oropharyngeal erythema.  Eyes:     General: No scleral icterus.    Extraocular Movements: Extraocular movements intact.     Conjunctiva/sclera: Conjunctivae normal.     Pupils: Pupils are equal, round, and reactive to light.  Neck:     Thyroid: No thyromegaly.     Vascular: No carotid bruit or JVD.  Cardiovascular:     Rate and Rhythm: Normal rate and regular rhythm.     Pulses: Normal pulses.     Heart sounds: Normal heart sounds.     No gallop.  Pulmonary:     Effort: Pulmonary effort is normal. No respiratory distress.     Breath sounds: Normal breath sounds. No wheezing.     Comments: Good air exch Chest:     Chest wall: No tenderness.  Abdominal:     General: Bowel sounds are normal. There is no distension or abdominal bruit.     Palpations: Abdomen is soft. There is no mass.     Tenderness: There is no abdominal tenderness.     Hernia: No hernia is present.  Genitourinary:    Comments: Breast and pelvic exams are done by gyn provider     Musculoskeletal:  General: No tenderness. Normal range of motion.     Cervical back: Normal range of motion and neck supple. No rigidity. No muscular tenderness.     Right lower leg: No edema.     Left lower leg: No edema.     Comments: No kyphosis   Lymphadenopathy:     Cervical: No cervical adenopathy.  Skin:    General: Skin is warm and dry.     Coloration: Skin is not pale.      Findings: No erythema or rash.  Neurological:     Mental Status: She is alert. Mental status is at baseline.     Cranial Nerves: No cranial nerve deficit.     Motor: No abnormal muscle tone.     Coordination: Coordination normal.     Gait: Gait normal.     Deep Tendon Reflexes: Reflexes are normal and symmetric. Reflexes normal.  Psychiatric:        Mood and Affect: Mood normal.        Cognition and Memory: Cognition and memory normal.           Assessment & Plan:   Problem List Items Addressed This Visit       Cardiovascular and Mediastinum   Essential hypertension    bp in fair control at this time  BP Readings from Last 1 Encounters:  10/10/22 134/86  No changes needed Most recent labs reviewed  Disc lifstyle change with low sodium diet and exercise  Plan to continue losartan 50 mg daily and triam hct  75-50 mg half pill daily      Relevant Medications   losartan (COZAAR) 50 MG tablet   rosuvastatin (CRESTOR) 10 MG tablet   triamterene-hydrochlorothiazide (MAXZIDE) 75-50 MG tablet     Endocrine   Controlled type 2 diabetes mellitus without complication, without long-term current use of insulin ([facility])    Lab Results  Component Value Date   HGBA1C 6.3 10/03/2022  This is up a bit  Disc low glycemic diet  Due for eye exam at end of may  On arb and statin  Good exercise  Plan to continue metformin 500 mg bid  Follow up 6 mo      Relevant Medications   losartan (COZAAR) 50 MG tablet   metFORMIN (GLUCOPHAGE) 500 MG tablet   rosuvastatin (CRESTOR) 10 MG tablet   Hyperlipidemia associated with type 2 diabetes mellitus ([place])    Disc goals for lipids and reasons to control them Rev last labs with pt Rev low sat fat diet in detail  Well controlled with crestor 10 mg dialy  LDL of 45       Relevant Medications   losartan (COZAAR) 50 MG tablet   metFORMIN (GLUCOPHAGE) 500 MG tablet   rosuvastatin (CRESTOR) 10 MG tablet   triamterene-hydrochlorothiazide  (MAXZIDE) 75-50 MG tablet     Other   Colon cancer screening    Polyps on colonoscopy 03/2022  Recall is 3 y       Routine general medical examination at a health care facility - Primary    Reviewed health habits including diet and exercise and skin cancer prevention Reviewed appropriate screening tests for age  Also reviewed health mt list, fam hx and immunization status , as well as social and family history   See HPI Labs reviewed  Declines shingrix vaccines  Td updated  Mammogram utd/ in setting of past breast cancer  Eye exam utd Pap utd 06/2021 from gyn  Colonoscopy 03/2022 with 3  y recall for polyps       Relevant Orders   Td : Tetanus/diphtheria >7yo Preservative  free (Completed)   Other Visit Diagnoses     Need for Td vaccine       Relevant Orders   Td : Tetanus/diphtheria >7yo Preservative  free (Completed)

## 2022-10-10 NOTE — Assessment & Plan Note (Signed)
Lab Results  Component Value Date   HGBA1C 6.3 10/03/2022   This is up a bit  Disc low glycemic diet  Due for eye exam at end of may  On arb and statin  Good exercise  Plan to continue metformin 500 mg bid  Follow up 6 mo

## 2022-10-10 NOTE — Assessment & Plan Note (Signed)
Disc goals for lipids and reasons to control them Rev last labs with pt Rev low sat fat diet in detail  Well controlled with crestor 10 mg dialy  LDL of 45

## 2022-10-10 NOTE — Assessment & Plan Note (Addendum)
Polyps on colonoscopy 03/2022  Recall is 3 y

## 2022-10-10 NOTE — Patient Instructions (Addendum)
Tetanus shot today   In addition to walking and bike  Do some strength training  Bands/ball/sit ups/push ups or machines or weights  Get stronger!  Try and stick to a diabetic diet   Take care of yourself   Follow up in 6 month for diabetes

## 2022-10-16 ENCOUNTER — Other Ambulatory Visit: Payer: Self-pay | Admitting: Family Medicine

## 2022-10-22 ENCOUNTER — Telehealth: Payer: Self-pay | Admitting: Family Medicine

## 2022-10-22 MED ORDER — ONETOUCH ULTRASOFT LANCETS MISC: 1 refills | Status: AC

## 2022-10-22 MED ORDER — ONETOUCH VERIO VI STRP: ORAL_STRIP | 1 refills | Status: DC

## 2022-10-22 NOTE — Telephone Encounter (Signed)
Prescription Request  10/22/2022  Is this a "Controlled Substance" medicine? No  LOV: 10/10/2022  What is the name of the medication or equipment? Lancets (ONETOUCH ULTRASOFT) lancets   glucose blood (ONETOUCH VERIO) test strip   Have you contacted your pharmacy to request a refill? Yes   Which pharmacy would you like this sent to?  [id number] [place] [facility] [id number] [place] [id number]: [phone]: [phone]   Patient notified that their request is being sent to the clinical staff for review and that they should receive a response within 2 business days.   Please advise at Mobile [phone](mobile)

## 2022-10-24 ENCOUNTER — Telehealth (INDEPENDENT_AMBULATORY_CARE_PROVIDER_SITE_OTHER): Payer: 59 | Admitting: Family Medicine

## 2022-10-24 ENCOUNTER — Encounter: Payer: Self-pay | Admitting: Family Medicine

## 2022-10-24 VITALS — Temp 97.6°F

## 2022-10-24 DIAGNOSIS — U071 COVID-19: Secondary | ICD-10-CM | POA: Diagnosis not present

## 2022-10-24 MED ORDER — NIRMATRELVIR/RITONAVIR (PAXLOVID)TABLET: 3.0000 | ORAL_TABLET | Freq: Two times a day (BID) | ORAL | 0 refills | Status: AC

## 2022-10-24 NOTE — Assessment & Plan Note (Addendum)
Mild symptoms in 57 yo with HTN and h/o breast cancer She is immunized After discussion/pt opted to try anti viral (is in window and nl gfr) Paxlovid px , inst to hold statin for a week  Also alert [place] if any intol side eff (hold)  Rev symptom control- see AVS Rev ER precautions Rev protocol for isolation and masking   Update if not starting to improve in a week or if worsening

## 2022-10-24 NOTE — Progress Notes (Signed)
Virtual Visit via Video Note  I connected with Lindsey Wright on 10/24/22 at 12:30 PM EST by a video enabled telemedicine application and verified that I am speaking with the correct person using two identifiers.  Location: Patient: home  Provider: office    I discussed the limitations of evaluation and management by telemedicine and the availability of in person appointments. The patient expressed understanding and agreed to proceed.  Parties involved in encounter  Patient: [name]  Provider:  Loura Pardon MD   History of Present Illness: Pt presents for covid 19   Symptoms started 2/12 She tested pos 2/13  Started with cold chills/ better now - temp has been normal  Runny nose - clear mucous  Not congested  Throat- fine Ears -fine A little cough - non productive  No wheezing No sob   No headache  Tired  Smell and taste are ok   Immunized- last booster in oct    Has a cruise planned 2 weeks   Lab Results  Component Value Date   CREATININE 0.80 10/03/2022   BUN 18 10/03/2022   NA 138 10/03/2022   K 3.6 10/03/2022   CL 101 10/03/2022   CO2 28 10/03/2022   GFR is 82.5     Patient Active Problem List   Diagnosis Date Noted   COVID-19 10/24/2022   Routine general medical examination at a health care facility 10/10/2022   Hyperlipidemia associated with type 2 diabetes mellitus ([place]) 10/04/2019   Low back pain 11/17/2018   Malignant neoplasm of lower-outer quadrant of 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, estrogen receptor positive ([place]) 12/26/2017   Colon cancer screening 01/29/2017   Controlled type 2 diabetes mellitus without complication, without long-term current use of insulin ([place]) 09/25/2013   Essential hypertension 08/15/2007   Past Medical History:  Diagnosis Date   Anemia    iron deficient anemia   Breast cancer ([place])    left   Diabetes mellitus without complication ([place])    History of Bell's palsy    x 2 as teenager and in 20's, last 2016    Hyperlipidemia    on meds   Hypertension    Menorrhagia    Overweight(278.02)    Peripheral edema    Personal history of chemotherapy    Personal history of radiation therapy    Past Surgical History:  Procedure Laterality Date   BREAST BIOPSY Left 12/2017   BREAST BIOPSY Left 02/26/2020   BREAST LUMPECTOMY Left 2019   BREAST LUMPECTOMY WITH RADIOACTIVE SEED AND SENTINEL LYMPH NODE BIOPSY Left 06/16/2018   Procedure: LEFT BREAST LUMPECTOMY WITH RADIOACTIVE SEED X'S 2 WITH SEED TARGETED LYMPH NODE EXCISION AND SENTINEL LYMPH NODE BIOPSY;  Surgeon: [name], MD;  Location: [facility];  Service: General;  Laterality: Left;   CESAREAN SECTION CLASSICAL     x2   CHOLECYSTECTOMY     COLONOSCOPY     POLYPECTOMY     PORT-A-CATH REMOVAL Right 10/30/2018   Procedure: REMOVAL PORT-A-CATH;  Surgeon: [name], MD;  Location: [facility];  Service: General;  Laterality: Right;   PORTACATH PLACEMENT Right 01/17/2018   Procedure: INSERTION PORT-A-CATH;  Surgeon: [name], MD;  Location: [facility];  Service: General;  Laterality: Right;   TUBAL LIGATION     Social History   Tobacco Use   Smoking status: Never   Smokeless tobacco: Never  Vaping Use   Vaping Use: Never used  Substance Use Topics  Alcohol use: No    Alcohol/week: 0.0 standard drinks of alcohol   Drug use: No   Family History  Problem Relation Age of Onset   Hypertension Mother    Diabetes Mother    Stroke Mother    Alcohol abuse Father    Cancer Father        lung ca   Hypertension Father    Hypertension Sister    Colon cancer Neg Hx    Colon polyps Neg Hx    Rectal cancer Neg Hx    Stomach cancer Neg Hx    Esophageal cancer Neg Hx    Allergies  Allergen Reactions   Ace Inhibitors Cough   Advil [Ibuprofen]     Hives - states able to tolerate 240m pill without any sxs    Current Outpatient Medications on File Prior to Visit  Medication Sig  Dispense Refill   Blood Glucose Monitoring Suppl (ONETOUCH VERIO FLEX SYSTEM) w/Device KIT To check glucose twice daily and prn for uncontrolled diabetes type 2 (E11.9) 1 kit 0   Calcium-Vitamin D-Vitamin K (VIACTIV PO) Take by mouth. 1 chew BID     glucose blood (ONETOUCH VERIO) test strip CHECK BLOOD SUGAR TWICE DAILY 100 strip 1   Lancets (ONETOUCH ULTRASOFT) lancets To check glucose twice daily and prn for uncontrolled diabetes 2 (E11.9) 100 each 1   losartan (COZAAR) 50 MG tablet Take 1 tablet (50 mg total) by mouth daily. 30 tablet 11   metFORMIN (GLUCOPHAGE) 500 MG tablet Take 1 tablet (500 mg total) by mouth 2 (two) times daily with a meal. 60 tablet 11   potassium chloride SA (KLOR-CON M20) 20 MEQ tablet Take 2 tablets (40 mEq total) by mouth daily. 60 tablet 11   rosuvastatin (CRESTOR) 10 MG tablet Take 1 tablet (10 mg total) by mouth daily. 30 tablet 11   triamterene-hydrochlorothiazide (MAXZIDE) 75-50 MG tablet Take 0.5 tablets by mouth daily. 15 tablet 11   No current facility-administered medications on file prior to visit.   Review of Systems  Constitutional:  Positive for chills and malaise/fatigue. Negative for diaphoresis and fever.  HENT:  Negative for congestion, ear pain, sinus pain and sore throat.        Runny nose and sneezing   Eyes:  Negative for blurred vision, discharge and redness.  Respiratory:  Positive for cough. Negative for sputum production, shortness of breath, wheezing and stridor.   Cardiovascular:  Negative for chest pain, palpitations and leg swelling.  Gastrointestinal:  Negative for abdominal pain, diarrhea, nausea and vomiting.  Musculoskeletal:  Negative for myalgias.  Skin:  Negative for rash.  Neurological:  Negative for dizziness and headaches.    Observations/Objective: Patient appears well, in no distress Weight is baseline  No facial swelling or asymmetry Voice is slightly hoarse Sniffling occ No obvious tremor or mobility  impairment Moving neck and UEs normally Able to hear the call well  No wheeze or shortness of breath during interview  Does not cough, occ clears throat  Talkative and mentally sharp with no cognitive changes No skin changes on face or neck , no rash or pallor Affect is normal    Assessment and Plan: Problem List Items Addressed This Visit       Other   COVID-19 - Primary    Mild symptoms in 57yo with HTN and h/o breast cancer She is immunized After discussion/pt opted to try anti viral (is in window and nl gfr) Paxlovid px , inst to hold  statin for a week  Also alert [place] if any intol side eff (hold)  Rev symptom control- see AVS Rev ER precautions Rev protocol for isolation and masking   Update if not starting to improve in a week or if worsening        Relevant Medications   nirmatrelvir/ritonavir (PAXLOVID) 20 x 150 MG & 10 x 100MG TABS        Follow Up Instructions:   Drink fluids and rest  mucinex DM is good for cough and congestion   ( or delsym if cough without congestion)  Nasal saline for congestion as needed  For sneezing or runny nose try generic zyrtec  Tylenol for fever or pain or headache  Please alert [place] if symptoms worsen (if severe or short of breath please go to the ER)    Take the paxlovid as directed Hold your cholesterol medicine/rosuvastatin for a week   If any intolerable side effects stop it and let us knowIsolate until your symptoms are better (a minimum of 5 days) Then mask for an additional 10 days when you go back into public  Update if not starting to improve in a week or if worsening  I discussed the assessment and treatment plan with the patient. The patient was provided an opportunity to ask questions and all were answered. The patient agreed with the plan and demonstrated an understanding of the instructions.   The patient was advised to call back or seek an in-person evaluation if the symptoms worsen or if the condition fails to  improve as anticipated.     Loura Pardon, MD

## 2022-10-24 NOTE — Patient Instructions (Addendum)
Drink fluids and rest  mucinex DM is good for cough and congestion   ( or delsym if cough without congestion)  Nasal saline for congestion as needed  For sneezing or runny nose try generic zyrtec  Tylenol for fever or pain or headache  Please alert [place] if symptoms worsen (if severe or short of breath please go to the ER)    Take the paxlovid as directed Hold your cholesterol medicine/rosuvastatin for a week   If any intolerable side effects stop it and let us knowIsolate until your symptoms are better (a minimum of 5 days) Then mask for an additional 10 days when you go back into public  Update if not starting to improve in a week or if worsening

## 2023-01-03 LAB — HM DIABETES EYE EXAM

## 2023-01-10 ENCOUNTER — Encounter: Payer: Self-pay | Admitting: Primary Care

## 2023-03-15 ENCOUNTER — Ambulatory Visit
Admission: RE | Admit: 2023-03-15 | Discharge: 2023-03-15 | Disposition: A | Discharge status: Home / Self Care | Payer: 59 | Source: Ambulatory Visit | Attending: Hematology and Oncology | Admitting: Hematology and Oncology

## 2023-03-15 DIAGNOSIS — Z170 Estrogen receptor positive status [ER+]: Secondary | ICD-10-CM

## 2023-04-02 ENCOUNTER — Telehealth: Payer: Self-pay | Admitting: Hematology and Oncology

## 2023-04-02 NOTE — Telephone Encounter (Signed)
Patient is aware of rescheduled appointment times/dates 

## 2023-04-10 ENCOUNTER — Encounter: Payer: Self-pay | Admitting: Family Medicine

## 2023-04-10 ENCOUNTER — Ambulatory Visit: Payer: 59 | Admitting: Family Medicine

## 2023-04-10 VITALS — BP 116/66 | HR 65 | Temp 96.9°F | Ht 66.0 in | Wt 185.0 lb

## 2023-04-10 DIAGNOSIS — E1169 Type 2 diabetes mellitus with other specified complication: Secondary | ICD-10-CM | POA: Diagnosis not present

## 2023-04-10 DIAGNOSIS — E119 Type 2 diabetes mellitus without complications: Secondary | ICD-10-CM | POA: Diagnosis not present

## 2023-04-10 DIAGNOSIS — Z7984 Long term (current) use of oral hypoglycemic drugs: Secondary | ICD-10-CM

## 2023-04-10 DIAGNOSIS — I10 Essential (primary) hypertension: Secondary | ICD-10-CM

## 2023-04-10 DIAGNOSIS — E785 Hyperlipidemia, unspecified: Secondary | ICD-10-CM | POA: Diagnosis not present

## 2023-04-10 LAB — POCT GLYCOSYLATED HEMOGLOBIN (HGB A1C): Hemoglobin A1C: 6.1 % — AB (ref 4.0–5.6)

## 2023-04-10 NOTE — Progress Notes (Signed)
Subjective:    Patient ID: Lindsey Wright, female    DOB: July 25, 1966, 57 y.o.   MRN: [id number]  HPI  Wt Readings from Last 3 Encounters:  04/10/23 185 lb (83.9 kg)  10/10/22 183 lb 4 oz (83.1 kg)  04/16/22 186 lb 12.8 oz (84.7 kg)   29.86 kg/m  Vitals:   04/10/23 0817  BP: 116/66  Pulse: 65  Temp: (!) 96.9 F (36.1 C)  SpO2: 97%   Pt presents for follow up of DM2, HTN and chronic medical problems   Feeling ok overall  Taking care of herself   Did a cruise in February   Trying to wean off of coke zero -took a while  Quit about a month ago  Feels better   Drinks water   Exercise - walking regularly  Trying pickle ball - really fun Does not like her stationary bike - likes walking better   HTN bp is stable today  No cp or palpitations or headaches or edema  No side effects to medicines  BP Readings from Last 3 Encounters:  04/10/23 116/66  10/10/22 134/86  08/29/22 (!) 148/87    Losartan 50 mg daily  Triam/hct 75-50 mg 1/2 pill daily   Lab Results  Component Value Date   NA 138 10/03/2022   K 3.6 10/03/2022   CO2 28 10/03/2022   GLUCOSE 88 10/03/2022   BUN 18 10/03/2022   CREATININE 0.80 10/03/2022   CALCIUM 9.5 10/03/2022   GFR 82.54 10/03/2022   GFRNONAA >60 04/16/2022   DM2 Lab Results  Component Value Date   HGBA1C 6.1 (A) 04/10/2023    Metformin 500 mg bid  Working on her diet  Loves sweets so she has been trying to eat fruit instead   Lab Results  Component Value Date   HGBA1C 6.1 (A) 04/10/2023     Eye exam -april  Lab Results  Component Value Date   MICROALBUR 1.3 10/03/2022    Hyperlipidemia Lab Results  Component Value Date   CHOL 107 10/03/2022   HDL 44.70 10/03/2022   LDLCALC 45 10/03/2022   TRIG 85.0 10/03/2022   CHOLHDL 2 10/03/2022   Crestor 10 mg daily      Patient Active Problem List   Diagnosis Date Noted   Routine general medical examination at a health care facility 10/10/2022    Hyperlipidemia associated with type 2 diabetes mellitus ([facility]) 10/04/2019   Low back pain 11/17/2018   Malignant neoplasm of lower-outer quadrant of 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, estrogen receptor positive ([facility]) 12/26/2017   Colon cancer screening 01/29/2017   Controlled type 2 diabetes mellitus without complication, without long-term current use of insulin ([facility]) 09/25/2013   Essential hypertension 08/15/2007   Past Medical History:  Diagnosis Date   Anemia    iron deficient anemia   Breast cancer ([facility])    left   Diabetes mellitus without complication ([facility])    History of Bell's palsy    x 2 as teenager and in 20's, last 2016   Hyperlipidemia    on meds   Hypertension    Menorrhagia    Overweight(278.02)    Peripheral edema    Personal history of chemotherapy    Personal history of radiation therapy    Past Surgical History:  Procedure Laterality Date   BREAST BIOPSY Left 12/2017   BREAST BIOPSY Left 02/26/2020   BREAST LUMPECTOMY Left 2019   BREAST LUMPECTOMY WITH RADIOACTIVE SEED AND SENTINEL LYMPH NODE BIOPSY Left 06/16/2018  Procedure: LEFT BREAST LUMPECTOMY WITH RADIOACTIVE SEED X'S 2 WITH SEED TARGETED LYMPH NODE EXCISION AND SENTINEL LYMPH NODE BIOPSY;  Surgeon: Chevis Pretty III, MD;  Location: [facility];  Service: General;  Laterality: Left;   CESAREAN SECTION CLASSICAL     x2   CHOLECYSTECTOMY     COLONOSCOPY     POLYPECTOMY     PORT-A-CATH REMOVAL Right 10/30/2018   Procedure: REMOVAL PORT-A-CATH;  Surgeon: Griselda Miner, MD;  Location: [facility];  Service: General;  Laterality: Right;   PORTACATH PLACEMENT Right 01/17/2018   Procedure: INSERTION PORT-A-CATH;  Surgeon: Griselda Miner, MD;  Location: [facility];  Service: General;  Laterality: Right;   TUBAL LIGATION     Social History   Tobacco Use   Smoking status: Never   Smokeless tobacco: Never  Vaping Use   Vaping status: Never Used  Substance Use Topics   Alcohol  use: No    Alcohol/week: 0.0 standard drinks of alcohol   Drug use: No   Family History  Problem Relation Age of Onset   Hypertension Mother    Diabetes Mother    Stroke Mother    Alcohol abuse Father    Cancer Father        lung ca   Hypertension Father    Hypertension Sister    Colon cancer Neg Hx    Colon polyps Neg Hx    Rectal cancer Neg Hx    Stomach cancer Neg Hx    Esophageal cancer Neg Hx    Allergies  Allergen Reactions   Ace Inhibitors Cough   Advil [Ibuprofen]     Hives - states able to tolerate 200mg  pill without any sxs    Current Outpatient Medications on File Prior to Visit  Medication Sig Dispense Refill   Blood Glucose Monitoring Suppl (ONETOUCH VERIO FLEX SYSTEM) w/Device KIT To check glucose twice daily and prn for uncontrolled diabetes type 2 (E11.9) 1 kit 0   Calcium-Vitamin D-Vitamin K (VIACTIV PO) Take by mouth. 1 chew BID     glucose blood (ONETOUCH VERIO) test strip CHECK BLOOD SUGAR TWICE DAILY 100 strip 1   Lancets (ONETOUCH ULTRASOFT) lancets To check glucose twice daily and prn for uncontrolled diabetes 2 (E11.9) 100 each 1   losartan (COZAAR) 50 MG tablet Take 1 tablet (50 mg total) by mouth daily. 30 tablet 11   metFORMIN (GLUCOPHAGE) 500 MG tablet Take 1 tablet (500 mg total) by mouth 2 (two) times daily with a meal. 60 tablet 11   potassium chloride SA (KLOR-CON M20) 20 MEQ tablet Take 2 tablets (40 mEq total) by mouth daily. 60 tablet 11   rosuvastatin (CRESTOR) 10 MG tablet Take 1 tablet (10 mg total) by mouth daily. 30 tablet 11   triamterene-hydrochlorothiazide (MAXZIDE) 75-50 MG tablet Take 0.5 tablets by mouth daily. 15 tablet 11   No current facility-administered medications on file prior to visit.    Review of Systems  Constitutional:  Negative for activity change, appetite change, fatigue, fever and unexpected weight change.  HENT:  Negative for congestion, ear pain, rhinorrhea, sinus pressure and sore throat.   Eyes:  Negative  for pain, redness and visual disturbance.  Respiratory:  Negative for cough, shortness of breath and wheezing.   Cardiovascular:  Negative for chest pain and palpitations.  Gastrointestinal:  Negative for abdominal pain, blood in stool, constipation and diarrhea.  Endocrine: Negative for polydipsia and polyuria.  Genitourinary:  Negative for dysuria, frequency  and urgency.  Musculoskeletal:  Negative for arthralgias, back pain and myalgias.  Skin:  Negative for pallor and rash.  Allergic/Immunologic: Negative for environmental allergies.  Neurological:  Negative for dizziness, syncope and headaches.  Hematological:  Negative for adenopathy. Does not bruise/bleed easily.  Psychiatric/Behavioral:  Negative for decreased concentration and dysphoric mood. The patient is not nervous/anxious.        Objective:   Physical Exam Constitutional:      General: She is not in acute distress.    Appearance: Normal appearance. She is well-developed. She is not ill-appearing or diaphoretic.     Comments: Overweight   HENT:     Head: Normocephalic and atraumatic.  Eyes:     Conjunctiva/sclera: Conjunctivae normal.     Pupils: Pupils are equal, round, and reactive to light.  Neck:     Thyroid: No thyromegaly.     Vascular: No carotid bruit or JVD.  Cardiovascular:     Rate and Rhythm: Normal rate and regular rhythm.     Heart sounds: Normal heart sounds.     No gallop.  Pulmonary:     Effort: Pulmonary effort is normal. No respiratory distress.     Breath sounds: Normal breath sounds. No wheezing or rales.  Abdominal:     General: There is no distension or abdominal bruit.     Palpations: Abdomen is soft.  Musculoskeletal:     Cervical back: Normal range of motion and neck supple.     Right lower leg: No edema.     Left lower leg: No edema.  Lymphadenopathy:     Cervical: No cervical adenopathy.  Skin:    General: Skin is warm and dry.     Coloration: Skin is not pale.     Findings: No  rash.  Neurological:     Mental Status: She is alert.     Coordination: Coordination normal.     Deep Tendon Reflexes: Reflexes are normal and symmetric. Reflexes normal.  Psychiatric:        Mood and Affect: Mood normal.           Assessment & Plan:   Problem List Items Addressed This Visit       Cardiovascular and Mediastinum   Essential hypertension    bp in fair control at this time  BP Readings from Last 1 Encounters:  04/10/23 116/66   No changes needed Most recent labs reviewed  Disc lifstyle change with low sodium diet and exercise  Plan to continue losartan 50 mg daily and triam hct  75-50 mg half pill daily        Endocrine   Hyperlipidemia associated with type 2 diabetes mellitus ([facility])    Disc goals for lipids and reasons to control them Rev last labs with pt Rev low sat fat diet in detail  Well controlled with crestor 10 mg dialy  LDL of 45       Controlled type 2 diabetes mellitus without complication, without long-term current use of insulin ([facility]) - Primary    Lab Results  Component Value Date   HGBA1C 6.1 (A) 04/10/2023   Improved Well controlled  Commended good diet/health habits Encouraged to add strength training  Eye exam utd Normal foot exam Microalb utd On statin and arb Follow up for annual exam in 6 months       Relevant Orders   POCT glycosylated hemoglobin (Hb A1C) (Completed)

## 2023-04-10 NOTE — Assessment & Plan Note (Signed)
Lab Results  Component Value Date   HGBA1C 6.1 (A) 04/10/2023   Improved Well controlled  Commended good diet/health habits Encouraged to add strength training  Eye exam utd Normal foot exam Microalb utd On statin and arb Follow up for annual exam in 6 months

## 2023-04-10 NOTE — Assessment & Plan Note (Signed)
Disc goals for lipids and reasons to control them Rev last labs with pt Rev low sat fat diet in detail  Well controlled with crestor 10 mg dialy  LDL of 45

## 2023-04-10 NOTE — Patient Instructions (Addendum)
Keep walking/ biking  Add some strength training to your routine, this is important for bone and brain health and can reduce your risk of falls and help your body use insulin properly and regulate weight  Light weights, exercise bands , and internet videos are a good way to start  Yoga (chair or regular), machines , floor exercises or a gym with machines are also good options    Follow up for annual exam in February with labs prior   Keep up the good work

## 2023-04-10 NOTE — Assessment & Plan Note (Signed)
bp in fair control at this time  BP Readings from Last 1 Encounters:  04/10/23 116/66   No changes needed Most recent labs reviewed  Disc lifstyle change with low sodium diet and exercise  Plan to continue losartan 50 mg daily and triam hct  75-50 mg half pill daily

## 2023-04-17 ENCOUNTER — Ambulatory Visit: Payer: 59 | Admitting: Hematology and Oncology

## 2023-05-14 ENCOUNTER — Other Ambulatory Visit (HOSPITAL_COMMUNITY): Payer: Self-pay

## 2023-05-23 ENCOUNTER — Encounter: Payer: Self-pay | Admitting: Hematology and Oncology

## 2023-05-23 ENCOUNTER — Inpatient Hospital Stay: Payer: 59 | Attending: Hematology and Oncology | Admitting: Hematology and Oncology

## 2023-05-23 VITALS — BP 139/79 | HR 57 | Temp 97.7°F | Resp 16 | Wt 187.0 lb

## 2023-05-23 DIAGNOSIS — Z79899 Other long term (current) drug therapy: Secondary | ICD-10-CM | POA: Diagnosis not present

## 2023-05-23 DIAGNOSIS — Z853 Personal history of malignant neoplasm of breast: Secondary | ICD-10-CM | POA: Diagnosis present

## 2023-05-23 DIAGNOSIS — Z923 Personal history of irradiation: Secondary | ICD-10-CM | POA: Insufficient documentation

## 2023-05-23 DIAGNOSIS — Z170 Estrogen receptor positive status [ER+]: Secondary | ICD-10-CM | POA: Diagnosis not present

## 2023-05-23 DIAGNOSIS — E119 Type 2 diabetes mellitus without complications: Secondary | ICD-10-CM | POA: Insufficient documentation

## 2023-05-23 DIAGNOSIS — E785 Hyperlipidemia, unspecified: Secondary | ICD-10-CM | POA: Insufficient documentation

## 2023-05-23 DIAGNOSIS — C50512 Malignant neoplasm of lower-outer quadrant of left female breast: Secondary | ICD-10-CM

## 2023-05-23 DIAGNOSIS — Z7984 Long term (current) use of oral hypoglycemic drugs: Secondary | ICD-10-CM | POA: Diagnosis not present

## 2023-05-23 DIAGNOSIS — Z801 Family history of malignant neoplasm of trachea, bronchus and lung: Secondary | ICD-10-CM | POA: Insufficient documentation

## 2023-05-23 DIAGNOSIS — I10 Essential (primary) hypertension: Secondary | ICD-10-CM | POA: Diagnosis not present

## 2023-05-23 DIAGNOSIS — D509 Iron deficiency anemia, unspecified: Secondary | ICD-10-CM | POA: Insufficient documentation

## 2023-05-23 DIAGNOSIS — Z9221 Personal history of antineoplastic chemotherapy: Secondary | ICD-10-CM | POA: Insufficient documentation

## 2023-05-23 NOTE — Progress Notes (Signed)
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 She continues under observation. She denies any new complaints. She is doing quite well overall.  Since her last visit here she had a cruise vacation and had a great time.  Her daughter is also graduating from physical therapy next year and she is excited about it.  She has not noticed any changes in her breast.  She continues to worry about the mammograms however her most recent mammogram did not require a 69-month follow-up. She denies any changes in breast. No changes in breathing, bowel and urinary habits Rest of the pertinent 10 point ROS reviewed and negative.    COVID 19 VACCINATION STATUS: Pfizer x3, most recently 09/2020   HISTORY OF CURRENT ILLNESS: From the original intake note:  "Lindsey Wright" palpated a mass in the left breast about 2 weeks ago. She followed up with her gynecologist, Dr. Burnetta Sabin who [facility]. She underwent unilateral left diagnostic mammography with tomography and left breast [facility] on 12[facility]20[facility]2017 showing: breast density category B. Suspicious newly palpable left breast mass at the 5 o'clock lower outer position with internal blow flow measuring 2.3 x 1.8 x 2.5 cm. There are either 2 adjacent masses or a single complicated mass at the 3:30  lower outer position, 4 cm from the nipple measuring 1.8 x 0.5 by 0.9 cm which may represent fibrocystic changes versus a solid mass. No other suspicious findings. Ultrasound showed normal axillary nodes.  Accordingly on 12/23/2017 she proceeded to biopsy of the left breast areas in question. The pathology from this procedure showed ([id number]): At both the 5 o'clock spanning 1.2 cm and 3:30 position spanning 0.7 cm, invasive ductal carcinoma, grade III. Prognostic indicators significant for: estrogen receptor, 10% positive with weak staining intensity and progesterone receptor, 0% negative. Proliferation marker Ki67 at 90%. HER2 amplified with ratios ER2/CEP17 signals 2.33 and average HER2 copies per cell 4.65  The patient's subsequent history is as detailed below.   PAST MEDICAL HISTORY: Past Medical History:  Diagnosis Date   Anemia    iron deficient anemia   Breast cancer (HCC)    left   Diabetes mellitus without complication (HCC)    History of Bell's palsy    x 2 as teenager and in 20's, last 2016   Hyperlipidemia    on meds   Hypertension    Menorrhagia    Overweight(278.02)    Peripheral edema    Personal history of chemotherapy    Personal history of radiation therapy     PAST SURGICAL HISTORY: Past Surgical History:  Procedure Laterality Date   BREAST BIOPSY Left 12/2017   BREAST BIOPSY Left 02/26/2020   BREAST LUMPECTOMY Left 2019   BREAST LUMPECTOMY WITH RADIOACTIVE SEED AND SENTINEL LYMPH NODE BIOPSY Left 06/16/2018   Procedure: LEFT BREAST LUMPECTOMY WITH RADIOACTIVE SEED X'S 2 WITH SEED TARGETED LYMPH NODE EXCISION AND SENTINEL LYMPH NODE BIOPSY;  Surgeon: Carolynne Edouard,  Lynetta Mare, MD;  Location: [facility];  Service: General;  Laterality: Left;   CESAREAN SECTION CLASSICAL     x2   CHOLECYSTECTOMY     COLONOSCOPY     POLYPECTOMY     PORT-A-CATH REMOVAL Right 10/30/2018   Procedure: REMOVAL PORT-A-CATH;  Surgeon: Griselda Miner, MD;  Location: [facility];  Service: General;  Laterality: Right;   PORTACATH PLACEMENT Right 01/17/2018   Procedure: INSERTION PORT-A-CATH;  Surgeon: Griselda Miner, MD;  Location: [facility];  Service: General;  Laterality: Right;   TUBAL LIGATION      FAMILY HISTORY Family History  Problem Relation Age of Onset   Hypertension Mother    Diabetes Mother    Stroke Mother    Alcohol abuse Father    Cancer Father        lung ca   Hypertension Father    Hypertension Sister    Colon cancer Neg Hx    Colon polyps Neg Hx    Rectal cancer Neg Hx    Stomach cancer Neg Hx    Esophageal cancer Neg Hx   The patient's father was diagnosed with lung cancer at age 48 and died the same year. The patient's mother died at age 73 due to several strokes. The patient had 1 brother who died due to strokes and possibly a MI. The patient has 1 sister. She denies a history of breast or ovarian cancer in the family.    GYNECOLOGIC HISTORY:  Patient's last menstrual period was 06/21/2008 (approximate). Menarche: 57 years old Age at first live birth: 57 years old The patient is GXP2. The patient is not having periods, with her LMP being in 2010. She used oral contraceptive for about 10 years with no complications. She never used HRT.    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  She retired in 2021, went back briefly to work and then we retired in June 2022.  Her husband, Berna Spare, works part time in Clinical biochemist.  The patient's older daughter Montel Clock age works for [employer] in the telemetry department and also in orthopedics.  She is getting married in 2022 and will start training and physical therapy January 2023.  She the patient's daughter Dorathy Daft is a senior this year at [employer].  She hopes to become an Tourist information centre manager.  Both of the patient's daughters live with her.     ADVANCED DIRECTIVES: In the absence of any documentation to  the contrary, the patient's spouse is their HCPOA.    HEALTH MAINTENANCE: Social History   Tobacco Use   Smoking status: Never   Smokeless tobacco: Never  Vaping Use   Vaping status: Never Used  Substance Use Topics   Alcohol use: No    Alcohol/week: 0.0 standard drinks of alcohol   Drug use: No     Colonoscopy: 03/27/2017 polyp removal/ Dr. Horris Latino  PAP: May 2017 normal  Bone density:   Allergies  Allergen Reactions   Ace Inhibitors Cough   Advil [Ibuprofen]     Hives - states able to tolerate 200mg  pill without any sxs     Current Outpatient Medications  Medication Sig Dispense Refill   Blood Glucose Monitoring Suppl (ONETOUCH VERIO FLEX SYSTEM) w/Device KIT To check glucose twice daily and prn for uncontrolled diabetes type 2 (E11.9) 1 kit 0   Calcium-Vitamin D-Vitamin K (VIACTIV PO) Take by mouth. 1 chew BID  glucose blood (ONETOUCH VERIO) test strip CHECK BLOOD SUGAR TWICE DAILY 100 strip 1   Lancets (ONETOUCH ULTRASOFT) lancets To check glucose twice daily and prn for uncontrolled diabetes 2 (E11.9) 100 each 1   losartan (COZAAR) 50 MG tablet Take 1 tablet (50 mg total) by mouth daily. 30 tablet 11   metFORMIN (GLUCOPHAGE) 500 MG tablet Take 1 tablet (500 mg total) by mouth 2 (two) times daily with a meal. 60 tablet 11   potassium chloride SA (KLOR-CON M20) 20 MEQ tablet Take 2 tablets (40 mEq total) by mouth daily. 60 tablet 11   rosuvastatin (CRESTOR) 10 MG tablet Take 1 tablet (10 mg total) by mouth daily. 30 tablet 11   triamterene-hydrochlorothiazide (MAXZIDE) 75-50 MG tablet Take 0.5 tablets by mouth daily. 15 tablet 11   No current facility-administered medications for this visit.    OBJECTIVE: African-American woman who appears well  There were no vitals filed for this visit.    There is no height or weight on file to calculate BMI.   Wt Readings from Last 3 Encounters:  04/10/23 185 lb (83.9 kg)  10/10/22 183 lb 4 oz (83.1 kg)  04/16/22 186  lb 12.8 oz (84.7 kg)  ECOG FS:1  Physical Exam Constitutional:      Appearance: Normal appearance.  Cardiovascular:     Rate and Rhythm: Normal rate and regular rhythm.     Pulses: Normal pulses.     Heart sounds: Normal heart sounds.  Pulmonary:     Effort: Pulmonary effort is normal.     Breath sounds: Normal breath sounds.  Chest:     Comments: Breast: Bilateral breasts inspected. No palpable masses or regional adenopathy Post surgical changes and radiation changes noted left breast. Musculoskeletal:        General: No swelling or tenderness. Normal range of motion.     Cervical back: Normal range of motion and neck supple. No rigidity.  Lymphadenopathy:     Cervical: No cervical adenopathy.  Skin:    General: Skin is warm and dry.  Neurological:     General: No focal deficit present.     Mental Status: She is alert.       LAB RESULTS:  CMP     Component Value Date/Time   NA 138 10/03/2022 0913   K 3.6 10/03/2022 0913   CL 101 10/03/2022 0913   CO2 28 10/03/2022 0913   GLUCOSE 88 10/03/2022 0913   BUN 18 10/03/2022 0913   CREATININE 0.80 10/03/2022 0913   CREATININE 0.92 04/16/2022 0856   CALCIUM 9.5 10/03/2022 0913   PROT 7.3 10/03/2022 0913   ALBUMIN 4.4 10/03/2022 0913   AST 20 10/03/2022 0913   AST 23 04/16/2022 0856   ALT 17 10/03/2022 0913   ALT 18 04/16/2022 0856   ALKPHOS 66 10/03/2022 0913   BILITOT 0.6 10/03/2022 0913   BILITOT 0.6 04/16/2022 0856   GFRNONAA >60 04/16/2022 0856   GFRAA >60 02/09/2020 1454   GFRAA >60 10/07/2018 0858    No results found for: "TOTALPROTELP", "ALBUMINELP", "A1GS", "A2GS", "BETS", "BETA2SER", "GAMS", "MSPIKE", "SPEI"  No results found for: "KPAFRELGTCHN", "LAMBDASER", "KAPLAMBRATIO"  Lab Results  Component Value Date   WBC 7.6 10/03/2022   NEUTROABS 4.6 10/03/2022   HGB 13.4 10/03/2022   HCT 39.7 10/03/2022   MCV 92.6 10/03/2022   PLT 202.0 10/03/2022   No results found for: "LABCA2"  No components  found for: "ZOXWRU045"  No results for input(s): "INR" in the last 168  hours.  No results found for: "LABCA2"  No results found for: "ZDG387"  No results found for: "CAN125"  No results found for: "CAN153"  No results found for: "CA2729"  No components found for: "HGQUANT"  No results found for: "CEA1", "CEA" / No results found for: "CEA1", "CEA"   No results found for: "AFPTUMOR"  No results found for: "CHROMOGRNA"  No results found for: "HGBA", "HGBA2QUANT", "HGBFQUANT", "HGBSQUAN" (Hemoglobinopathy evaluation)   No results found for: "LDH"  Lab Results  Component Value Date   IRON 104 09/27/2008   IRONPCTSAT 25.1 09/27/2008   (Iron and TIBC)  No results found for: "FERRITIN"  Urinalysis    Component Value Date/Time   COLORURINE YELLOW 02/15/2018 2014   APPEARANCEUR CLEAR 02/15/2018 2014   LABSPEC 1.011 02/15/2018 2014   PHURINE 7.0 02/15/2018 2014   GLUCOSEU NEGATIVE 02/15/2018 2014   HGBUR SMALL (A) 02/15/2018 2014   BILIRUBINUR NEGATIVE 02/15/2018 2014   KETONESUR NEGATIVE 02/15/2018 2014   PROTEINUR NEGATIVE 02/15/2018 2014   NITRITE NEGATIVE 02/15/2018 2014   LEUKOCYTESUR NEGATIVE 02/15/2018 2014    STUDIES: No results found.   ELIGIBLE FOR AVAILABLE RESEARCH PROTOCOL: Participating in exact sciences blood draw study   ASSESSMENT: 57 y.o.  Mardene Sayer, [place] woman status post left breast lower outer quadrant biopsy 12/23/2017 for a clinically multifocal T2 N0, stage II invasive ductal carcinoma, grade 3, essentially estrogen receptor and progesterone receptor negative, but HER-2 amplified, with an MIB-1 of 90%.  (a) breast MRI 01/07/2018 shows the 2.6 cm main mass and 4 additional smaller masses spanning 7.1 cm; there was a suspicious left axillary lymph node  (b) left axillary lymph node biopsy 01/14/2018 was benign/discordant (no lymph node tissue)  (1) neoadjuvant chemotherapy consisting of carboplatin and docetaxel every 21 days for 6 cycles  started 01/20/2018, completed 05/05/2018, given in conjunction with anti-HER-2 immunotherapy  (2) anti-HER-2 immunotherapy consisting of trastuzumab and Pertuzumab started 01/20/2018, continued for 6 months, last dose 07/29/2018  (a) pertuzumab discontinued after cycle 2 due to diarrhea  (b) baseline echocardiogram 01/08/2018 shows an ejection fraction in the 60-65% range  (c) echocardiogram on 04/24/2018 shows an ejection fraction in the 65-70% range  (d) echocardiogram 07/31/2018 showed an ejection fraction in the 55-60% range  (3) left lumpectomy and sentinel lymph node biopsy 10 07 2019 showed a residual ypT1a ypN0 invasive ductal carcinoma, grade 3, with a repeat prognostic panel now triple negative  (4) adjuvant radiation 07/28/2018 - 09/15/2018  Site/dose: The patient initially received a dose of 50.4 Gy in 28 fractions to the left breast using whole-breast tangent fields. This was delivered using a 3-D conformal technique. The patient then received a boost to the seroma. This delivered an additional 10 Gy in 5 fractions using 6X, 10X photons with a Complex Isodose technique. The total dose was 60.4 Gy.    PLAN:  Ms Tiyona is here for a follow up. She is doing quite well today. No concerns for recurrence.  At this point she is 5 years out from diagnosis and she is doing really well. PE today unremarkable, no palpable masses or regional recurrence. She had a mammogram recently, 2 mm calcs noted in left breast, these were deemed to be stable and benign and hence 1 year mammogram has been recommended at this point.  She is otherwise up to date with age appropriate cancer screening. RTC in one yr or sooner as needed.  Total encounter time 20 minutes.*  *Total Encounter Time as defined by the Centers  for Medicare and Medicaid Services includes, in addition to the face-to-face time of a patient visit (documented in the note above) non-face-to-face time: obtaining and reviewing outside history,  ordering and reviewing medications, tests or procedures, care coordination (communications with other health care professionals or caregivers) and documentation in the medical record.

## 2023-09-23 ENCOUNTER — Other Ambulatory Visit: Payer: Self-pay | Admitting: Obstetrics & Gynecology

## 2023-09-23 DIAGNOSIS — N6452 Nipple discharge: Secondary | ICD-10-CM

## 2023-10-02 ENCOUNTER — Other Ambulatory Visit: Payer: Self-pay | Admitting: Family Medicine

## 2023-10-06 ENCOUNTER — Telehealth: Payer: Self-pay | Admitting: Family Medicine

## 2023-10-06 DIAGNOSIS — E1169 Type 2 diabetes mellitus with other specified complication: Secondary | ICD-10-CM

## 2023-10-06 DIAGNOSIS — E119 Type 2 diabetes mellitus without complications: Secondary | ICD-10-CM

## 2023-10-06 DIAGNOSIS — I10 Essential (primary) hypertension: Secondary | ICD-10-CM

## 2023-10-06 NOTE — Telephone Encounter (Signed)
-----   Message from Lovena Neighbours sent at 09/24/2023  1:56 PM EST ----- Regarding: Labs for Monday 1.27.25 Please put lab orders in future. Thank you, Denny Peon

## 2023-10-07 ENCOUNTER — Other Ambulatory Visit (INDEPENDENT_AMBULATORY_CARE_PROVIDER_SITE_OTHER): Payer: 59

## 2023-10-07 DIAGNOSIS — E1169 Type 2 diabetes mellitus with other specified complication: Secondary | ICD-10-CM

## 2023-10-07 DIAGNOSIS — E119 Type 2 diabetes mellitus without complications: Secondary | ICD-10-CM

## 2023-10-07 DIAGNOSIS — E785 Hyperlipidemia, unspecified: Secondary | ICD-10-CM

## 2023-10-07 DIAGNOSIS — I10 Essential (primary) hypertension: Secondary | ICD-10-CM

## 2023-10-07 LAB — COMPREHENSIVE METABOLIC PANEL
ALT: 20 U/L (ref 0–35)
AST: 24 U/L (ref 0–37)
Albumin: 4.6 g/dL (ref 3.5–5.2)
Alkaline Phosphatase: 78 U/L (ref 39–117)
BUN: 15 mg/dL (ref 6–23)
CO2: 30 meq/L (ref 19–32)
Calcium: 9.6 mg/dL (ref 8.4–10.5)
Chloride: 100 meq/L (ref 96–112)
Creatinine, Ser: 0.93 mg/dL (ref 0.40–1.20)
GFR: 68.41 mL/min (ref >60.00)
Glucose, Bld: 111 mg/dL — ABNORMAL HIGH (ref 70–99)
Potassium: 3.5 meq/L (ref 3.5–5.1)
Sodium: 139 meq/L (ref 135–145)
Total Bilirubin: 0.7 mg/dL (ref 0.2–1.2)
Total Protein: 7.5 g/dL (ref 6.0–8.3)

## 2023-10-07 LAB — LIPID PANEL
Cholesterol: 114 mg/dL (ref 0–200)
HDL: 49.1 mg/dL (ref >39.00)
LDL Cholesterol: 50 mg/dL (ref 0–99)
NonHDL: 65.14
Total CHOL/HDL Ratio: 2
Triglycerides: 76 mg/dL (ref 0.0–149.0)
VLDL: 15.2 mg/dL (ref 0.0–40.0)

## 2023-10-07 LAB — CBC WITH DIFFERENTIAL/PLATELET
Basophils Absolute: 0.1 10*3/uL (ref 0.0–0.1)
Basophils Relative: 0.9 % (ref 0.0–3.0)
Eosinophils Absolute: 0.1 10*3/uL (ref 0.0–0.7)
Eosinophils Relative: 2.2 % (ref 0.0–5.0)
HCT: 42.7 % (ref 36.0–46.0)
Hemoglobin: 14 g/dL (ref 12.0–15.0)
Lymphocytes Relative: 28.2 % (ref 12.0–46.0)
Lymphs Abs: 1.9 10*3/uL (ref 0.7–4.0)
MCHC: 32.8 g/dL (ref 30.0–36.0)
MCV: 93.8 fL (ref 78.0–100.0)
Monocytes Absolute: 0.5 10*3/uL (ref 0.1–1.0)
Monocytes Relative: 7.6 % (ref 3.0–12.0)
Neutro Abs: 4 10*3/uL (ref 1.4–7.7)
Neutrophils Relative %: 61.1 % (ref 43.0–77.0)
Platelets: 246 10*3/uL (ref 150.0–400.0)
RBC: 4.56 Mil/uL (ref 3.87–5.11)
RDW: 14.6 % (ref 11.5–15.5)
WBC: 6.6 10*3/uL (ref 4.0–10.5)

## 2023-10-07 LAB — TSH: TSH: 1.61 u[IU]/mL (ref 0.35–5.50)

## 2023-10-07 LAB — HEMOGLOBIN A1C: Hgb A1c MFr Bld: 6.6 % — ABNORMAL HIGH (ref 4.6–6.5)

## 2023-10-07 LAB — MICROALBUMIN / CREATININE URINE RATIO
Creatinine,U: 107.6 mg/dL
Microalb Creat Ratio: 2 mg/g (ref 0.0–30.0)
Microalb, Ur: 2.2 mg/dL — ABNORMAL HIGH (ref 0.0–1.9)

## 2023-10-09 ENCOUNTER — Other Ambulatory Visit: Payer: 59

## 2023-10-10 ENCOUNTER — Ambulatory Visit
Admission: RE | Admit: 2023-10-10 | Discharge: 2023-10-10 | Disposition: A | Discharge status: Home / Self Care | Payer: 59 | Source: Ambulatory Visit | Attending: Obstetrics & Gynecology | Admitting: Obstetrics & Gynecology

## 2023-10-10 ENCOUNTER — Other Ambulatory Visit: Payer: Self-pay | Admitting: Obstetrics & Gynecology

## 2023-10-10 DIAGNOSIS — N6310 Unspecified lump in the right breast, unspecified quadrant: Secondary | ICD-10-CM

## 2023-10-10 DIAGNOSIS — N6452 Nipple discharge: Secondary | ICD-10-CM

## 2023-10-14 ENCOUNTER — Ambulatory Visit
Admission: RE | Admit: 2023-10-14 | Discharge: 2023-10-14 | Disposition: A | Discharge status: Home / Self Care | Payer: 59 | Source: Ambulatory Visit | Attending: Obstetrics & Gynecology | Admitting: Obstetrics & Gynecology

## 2023-10-14 DIAGNOSIS — N6310 Unspecified lump in the right breast, unspecified quadrant: Secondary | ICD-10-CM

## 2023-10-14 HISTORY — PX: BREAST BIOPSY: SHX20

## 2023-10-15 ENCOUNTER — Other Ambulatory Visit: Payer: Self-pay | Admitting: Family Medicine

## 2023-10-15 LAB — SURGICAL PATHOLOGY

## 2023-10-15 NOTE — Telephone Encounter (Signed)
 Will hold until appt tomorrow

## 2023-10-16 ENCOUNTER — Ambulatory Visit: Payer: 59 | Admitting: Family Medicine

## 2023-10-16 ENCOUNTER — Encounter: Payer: Self-pay | Admitting: Family Medicine

## 2023-10-16 VITALS — BP 130/78 | HR 61 | Temp 98.3°F | Ht 66.0 in | Wt 186.2 lb

## 2023-10-16 DIAGNOSIS — E66811 Obesity, class 1: Secondary | ICD-10-CM

## 2023-10-16 DIAGNOSIS — E785 Hyperlipidemia, unspecified: Secondary | ICD-10-CM

## 2023-10-16 DIAGNOSIS — E119 Type 2 diabetes mellitus without complications: Secondary | ICD-10-CM | POA: Diagnosis not present

## 2023-10-16 DIAGNOSIS — Z6830 Body mass index (BMI) 30.0-30.9, adult: Secondary | ICD-10-CM

## 2023-10-16 DIAGNOSIS — C50512 Malignant neoplasm of lower-outer quadrant of left female breast: Secondary | ICD-10-CM

## 2023-10-16 DIAGNOSIS — Z1211 Encounter for screening for malignant neoplasm of colon: Secondary | ICD-10-CM

## 2023-10-16 DIAGNOSIS — E6609 Other obesity due to excess calories: Secondary | ICD-10-CM

## 2023-10-16 DIAGNOSIS — E1169 Type 2 diabetes mellitus with other specified complication: Secondary | ICD-10-CM

## 2023-10-16 DIAGNOSIS — Z0000 Encounter for general adult medical examination without abnormal findings: Secondary | ICD-10-CM | POA: Diagnosis not present

## 2023-10-16 DIAGNOSIS — I10 Essential (primary) hypertension: Secondary | ICD-10-CM | POA: Diagnosis not present

## 2023-10-16 DIAGNOSIS — Z170 Estrogen receptor positive status [ER+]: Secondary | ICD-10-CM

## 2023-10-16 DIAGNOSIS — Z7984 Long term (current) use of oral hypoglycemic drugs: Secondary | ICD-10-CM

## 2023-10-16 DIAGNOSIS — Z23 Encounter for immunization: Secondary | ICD-10-CM

## 2023-10-16 MED ORDER — POTASSIUM CHLORIDE CRYS ER 20 MEQ PO TBCR: 40.0000 meq | EXTENDED_RELEASE_TABLET | Freq: Every day | ORAL | 3 refills | Status: AC

## 2023-10-16 MED ORDER — TRIAMTERENE-HCTZ 75-50 MG PO TABS: 0.5000 | ORAL_TABLET | Freq: Every day | ORAL | 3 refills | Status: AC

## 2023-10-16 MED ORDER — ROSUVASTATIN CALCIUM 10 MG PO TABS: 10.0000 mg | ORAL_TABLET | Freq: Every day | ORAL | 3 refills | Status: AC

## 2023-10-16 MED ORDER — LOSARTAN POTASSIUM 50 MG PO TABS: 50.0000 mg | ORAL_TABLET | Freq: Every day | ORAL | 3 refills | Status: AC

## 2023-10-16 MED ORDER — METFORMIN HCL 500 MG PO TABS: 500.0000 mg | ORAL_TABLET | Freq: Two times a day (BID) | ORAL | 3 refills | Status: DC

## 2023-10-16 NOTE — Assessment & Plan Note (Signed)
 Lab Results  Component Value Date   HGBA1C 6.6 (H) 10/07/2023   HGBA1C 6.1 (A) 04/10/2023   HGBA1C 6.3 10/03/2022   Up per pt due to holiday eating  Discussed low glycemic diet  Eye exam due April/ goes it here On arb and statin   Metformin  500 mg bid   Encouraged to add more exercise  U microalb is in normal range  Follow up 3 months

## 2023-10-16 NOTE — Assessment & Plan Note (Signed)
 Reviewed health habits including diet and exercise and skin cancer prevention Reviewed appropriate screening tests for age  Also reviewed health mt list, fam hx and immunization status , as well as social and family history   See HPI Labs reviewed and ordered Health Maintenance  Topic Date Due   Pneumococcal Vaccination (1 of 2 - PCV) Never done   Zoster (Shingles) Vaccine (1 of 2) Never done   Hepatitis C Screening  10/15/2024*   COVID-19 Vaccine (8 - 2024-25 season) 10/31/2024*   Eye exam for diabetics  01/03/2024   Mammogram  03/14/2024   Hemoglobin A1C  04/05/2024   Complete foot exam   04/09/2024   Pap with HPV screening  06/19/2024   Yearly kidney function blood test for diabetes  10/06/2024   Yearly kidney health urinalysis for diabetes  10/06/2024   Colon Cancer Screening  04/04/2025   DTaP/Tdap/Td vaccine (3 - Td or Tdap) 10/10/2032   Flu Shot  Completed   HIV Screening  Completed   HPV Vaccine  Aged Out  *Topic was postponed. The date shown is not the original due date.   Prevnar 20 vaccine today  Considering shingrix if covered  Pap 2022-will send for report in case it has been more recent  Discussed fall prevention, supplements and exercise for bone density  PHQ 0

## 2023-10-16 NOTE — Assessment & Plan Note (Signed)
 Doing well  Had recent bx for right nipple discharge that was negative  Next mammo due in July Continues gyn care as well

## 2023-10-16 NOTE — Assessment & Plan Note (Signed)
 bp in fair control at this time  BP Readings from Last 1 Encounters:  10/16/23 130/78   No changes needed Most recent labs reviewed  Disc lifstyle change with low sodium diet and exercise  Plan to continue losartan  50 mg daily and triam hct  75-50 mg half pill daily

## 2023-10-16 NOTE — Assessment & Plan Note (Signed)
 Discussed how this problem influences overall health and the risks it imposes  Reviewed plan for weight loss with lower calorie diet (via better food choices (lower glycemic and portion control) along with exercise building up to or more than 30 minutes 5 days per week including some aerobic activity and strength training   Will add strength training

## 2023-10-16 NOTE — Assessment & Plan Note (Signed)
 Colonoscopy 03/2022 with 3 y recall for polyps

## 2023-10-16 NOTE — Assessment & Plan Note (Signed)
 Disc goals for lipids and reasons to control them Rev last labs with pt Rev low sat fat diet in detail  Well controlled with crestor  10 mg dialy  LDL of 50

## 2023-10-16 NOTE — Progress Notes (Signed)
 Subjective:    Patient ID: Lindsey Wright, female    DOB: Sep 28, 1965, 58 y.o.   MRN: [id number]  HPI  Here for health maintenance exam and to review chronic medical problems   Wt Readings from Last 3 Encounters:  10/16/23 186 lb 4 oz (84.5 kg)  05/23/23 187 lb (84.8 kg)  04/10/23 185 lb (83.9 kg)   30.06 kg/m  Vitals:   10/16/23 0902  BP: 130/78  Pulse: 61  Temp: 98.3 F (36.8 C)  SpO2: 99%    Immunization History  Administered Date(s) Administered   Fluad Quad(high Dose 65+) 06/19/2022   Influenza, Mdck, Trivalent,PF 6+ MOS(egg free) 06/20/2023   Influenza,inj,Quad PF,6+ Mos 06/04/2019, 06/29/2020, 08/25/2021   Moderna Covid-19 Fall Seasonal Vaccine 75yrs & older 06/19/2022   PFIZER Comirnaty(Gray Top)Covid-19 Tri-Sucrose Vaccine 04/04/2021   PFIZER(Purple Top)SARS-COV-2 Vaccination 12/17/2019, 09/22/2020   Pfizer(Comirnaty)Fall Seasonal Vaccine 12 years and older 06/20/2023   Td 10/10/2022   Tdap 09/15/2012   Unspecified SARS-COV-2 Vaccination 12/17/2019, 01/11/2020    Health Maintenance Due  Topic Date Due   Pneumococcal Vaccine 47-84 Years old (1 of 2 - PCV) Never done   Zoster Vaccines- Shingrix (1 of 2) Never done   Shingrix vaccine -considering it / will check with insurance   Pna vaccine - interested   Mammogram 03/2023  Had biopsy on Monday and it was benign and that is a big relief  (after brown d/c from right nipple once)  Had a papilloma  Personal history of breast cancer  Self breast exam-no changes now (her left breast is a different texture)   Continues oncologist visits   Gyn health Pap 06/2021 with 3 y recall  Sees gyn regularly  Goes to [facility]  Does not do pap every year / is up to date    Colon cancer screening colonoscopy 03/2022 with 3 y recall for polyps     Bone health   Falls -none  Fractures-none  Supplements - ca with D and K    Exercise :  Walks 30 minutes per day- 2 miles - wants to do more   Dislikes her bike  No strength training     Mood    04/10/2023    8:23 AM 10/10/2022    9:11 AM 01/05/2020    9:04 AM 10/20/2019    5:33 PM 09/28/2019   12:20 PM  Depression screen PHQ 2/9  Decreased Interest 0 0 0 0 1  Down, Depressed, Hopeless 0 0 0 0 0  PHQ - 2 Score 0 0 0 0 1  Altered sleeping 0 0 0  0  Tired, decreased energy 0 0 0  0  Change in appetite 0 0 0  0  Feeling bad or failure about yourself  0 0 0  0  Trouble concentrating 0 0 0  0  Moving slowly or fidgety/restless 0 0 0  0  Suicidal thoughts 0 0 0  0  PHQ-9 Score 0 0 0  1  Difficult doing work/chores Not difficult at all Not difficult at all Not difficult at all     HTN bp is stable today  No cp or palpitations or headaches or edema  No side effects to medicines  BP Readings from Last 3 Encounters:  10/16/23 130/78  05/23/23 139/79  04/10/23 116/66    Pulse Readings from Last 3 Encounters:  10/16/23 61  05/23/23 (!) 57  04/10/23 65   Losartan  50 mg daily  Triam hct 75-50 mg  half pill daily   Lab Results  Component Value Date   NA 139 10/07/2023   K 3.5 10/07/2023   CO2 30 10/07/2023   GLUCOSE 111 (H) 10/07/2023   BUN 15 10/07/2023   CREATININE 0.93 10/07/2023   CALCIUM  9.6 10/07/2023   GFR 68.41 10/07/2023   GFRNONAA >60 04/16/2022    DM2 Lab Results  Component Value Date   HGBA1C 6.6 (H) 10/07/2023   HGBA1C 6.1 (A) 04/10/2023   HGBA1C 6.3 10/03/2022  Ate poorly over xmas  Metformin  500 mg bid - tolerating well  Diet : is eating better   Trying to eat less sweets  Occational pc of candy -every other today Chooses whole grain items    Taking statin and arb  Lab Results  Component Value Date   MICROALBUR 2.2 (H) 10/07/2023   MICROALBUR 1.3 10/03/2022   Eye exam due in April Does it here      Hyperlipidemia Lab Results  Component Value Date   CHOL 114 10/07/2023   CHOL 107 10/03/2022   CHOL 103 09/19/2021   Lab Results  Component Value Date   HDL 49.10  10/07/2023   HDL 44.70 10/03/2022   HDL 45.00 09/19/2021   Lab Results  Component Value Date   LDLCALC 50 10/07/2023   LDLCALC 45 10/03/2022   LDLCALC 45 09/19/2021   Lab Results  Component Value Date   TRIG 76.0 10/07/2023   TRIG 85.0 10/03/2022   TRIG 65.0 09/19/2021   Lab Results  Component Value Date   CHOLHDL 2 10/07/2023   CHOLHDL 2 10/03/2022   CHOLHDL 2 09/19/2021   No results found for: LDLDIRECT  Crestor  10 mg daily   Lab Results  Component Value Date   TSH 1.61 10/07/2023      Patient Active Problem List   Diagnosis Date Noted   Routine general medical examination at a health care facility 10/10/2022   Hyperlipidemia associated with type 2 diabetes mellitus (HCC) 10/04/2019   Low back pain 11/17/2018   Malignant neoplasm of lower-outer quadrant of 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, estrogen receptor positive (HCC) 12/26/2017   Colon cancer screening 01/29/2017   Controlled type 2 diabetes mellitus without complication, without long-term current use of insulin (HCC) 09/25/2013   Obesity due to excess calories 09/25/2013   Essential hypertension 08/15/2007   Past Medical History:  Diagnosis Date   Anemia    iron deficient anemia   Breast cancer (HCC)    left   Diabetes mellitus without complication (HCC)    History of Bell's palsy    x 2 as teenager and in 20's, last 2016   Hyperlipidemia    on meds   Hypertension    Menorrhagia    Overweight(278.02)    Peripheral edema    Personal history of chemotherapy    Personal history of radiation therapy    Past Surgical History:  Procedure Laterality Date   BREAST BIOPSY Left 12/2017   BREAST BIOPSY Left 02/26/2020   BREAST BIOPSY Right 10/14/2023   US  RT BREAST BX W LOC DEV 1ST LESION IMG BX SPEC US  GUIDE 10/14/2023 [facility]   BREAST LUMPECTOMY Left 2019   BREAST LUMPECTOMY WITH RADIOACTIVE SEED AND SENTINEL LYMPH NODE BIOPSY Left 06/16/2018   Procedure: LEFT BREAST LUMPECTOMY WITH RADIOACTIVE SEED  X'S 2 WITH SEED TARGETED LYMPH NODE EXCISION AND SENTINEL LYMPH NODE BIOPSY;  Surgeon: Curvin Deward MOULD, MD;  Location: [facility];  Service: General;  Laterality: Left;   CESAREAN  SECTION CLASSICAL     x2   CHOLECYSTECTOMY     COLONOSCOPY     POLYPECTOMY     PORT-A-CATH REMOVAL Right 10/30/2018   Procedure: REMOVAL PORT-A-CATH;  Surgeon: Curvin Deward MOULD, MD;  Location: [facility];  Service: General;  Laterality: Right;   PORTACATH PLACEMENT Right 01/17/2018   Procedure: INSERTION PORT-A-CATH;  Surgeon: Curvin Deward MOULD, MD;  Location: [facility];  Service: General;  Laterality: Right;   TUBAL LIGATION     Social History   Tobacco Use   Smoking status: Never   Smokeless tobacco: Never  Vaping Use   Vaping status: Never Used  Substance Use Topics   Alcohol use: No   Drug use: No   Family History  Problem Relation Age of Onset   Hypertension Mother    Diabetes Mother    Stroke Mother    Alcohol abuse Father    Cancer Father        lung ca   Hypertension Father    Hypertension Sister    Colon cancer Neg Hx    Colon polyps Neg Hx    Rectal cancer Neg Hx    Stomach cancer Neg Hx    Esophageal cancer Neg Hx    Allergies  Allergen Reactions   Ace Inhibitors Cough   Advil [Ibuprofen]     Hives - states able to tolerate 200mg  pill without any sxs    Current Outpatient Medications on File Prior to Visit  Medication Sig Dispense Refill   Blood Glucose Monitoring Suppl (ONETOUCH VERIO FLEX SYSTEM) w/Device KIT To check glucose twice daily and prn for uncontrolled diabetes type 2 (E11.9) 1 kit 0   Calcium -Vitamin D -Vitamin K (VIACTIV PO) Take by mouth. 1 chew BID     glucose blood (ONETOUCH VERIO) test strip CHECK BLOOD SUGAR TWICE DAILY 100 strip 1   Lancets (ONETOUCH ULTRASOFT) lancets To check glucose twice daily and prn for uncontrolled diabetes 2 (E11.9) 100 each 1   No current facility-administered medications on file prior to  visit.    Review of Systems  Constitutional:  Negative for activity change, appetite change, fatigue, fever and unexpected weight change.  HENT:  Negative for congestion, ear pain, rhinorrhea, sinus pressure and sore throat.   Eyes:  Negative for pain, redness and visual disturbance.  Respiratory:  Negative for cough, shortness of breath and wheezing.   Cardiovascular:  Negative for chest pain and palpitations.  Gastrointestinal:  Negative for abdominal pain, blood in stool, constipation and diarrhea.  Endocrine: Negative for polydipsia and polyuria.  Genitourinary:  Negative for dysuria, frequency and urgency.  Musculoskeletal:  Negative for arthralgias, back pain and myalgias.  Skin:  Negative for pallor and rash.  Allergic/Immunologic: Negative for environmental allergies.  Neurological:  Negative for dizziness, syncope and headaches.  Hematological:  Negative for adenopathy. Does not bruise/bleed easily.  Psychiatric/Behavioral:  Negative for decreased concentration and dysphoric mood. The patient is not nervous/anxious.        Objective:   Physical Exam Constitutional:      General: She is not in acute distress.    Appearance: Normal appearance. She is well-developed. She is obese. She is not ill-appearing or diaphoretic.  HENT:     Head: Normocephalic and atraumatic.     Right Ear: Tympanic membrane, ear canal and external ear normal.     Left Ear: Tympanic membrane, ear canal and external ear normal.     Nose: Nose normal. No congestion.  Mouth/Throat:     Mouth: Mucous membranes are moist.     Pharynx: Oropharynx is clear. No posterior oropharyngeal erythema.  Eyes:     General: No scleral icterus.    Extraocular Movements: Extraocular movements intact.     Conjunctiva/sclera: Conjunctivae normal.     Pupils: Pupils are equal, round, and reactive to light.  Neck:     Thyroid : No thyromegaly.     Vascular: No carotid bruit or JVD.  Cardiovascular:     Rate and  Rhythm: Normal rate and regular rhythm.     Pulses: Normal pulses.     Heart sounds: Normal heart sounds.     No gallop.  Pulmonary:     Effort: Pulmonary effort is normal. No respiratory distress.     Breath sounds: Normal breath sounds. No wheezing.     Comments: Good air exch Chest:     Chest wall: No tenderness.  Abdominal:     General: Bowel sounds are normal. There is no distension or abdominal bruit.     Palpations: Abdomen is soft. There is no mass.     Tenderness: There is no abdominal tenderness.     Hernia: No hernia is present.  Genitourinary:    Comments: Breast and pelvic exam are done by gyn provider   Also recent surgeon visit and negative biopsy  Musculoskeletal:        General: No tenderness. Normal range of motion.     Cervical back: Normal range of motion and neck supple. No rigidity. No muscular tenderness.     Right lower leg: No edema.     Left lower leg: No edema.     Comments: No kyphosis   Lymphadenopathy:     Cervical: No cervical adenopathy.  Skin:    General: Skin is warm and dry.     Coloration: Skin is not pale.     Findings: No erythema or rash.  Neurological:     Mental Status: She is alert. Mental status is at baseline.     Cranial Nerves: No cranial nerve deficit.     Motor: No abnormal muscle tone.     Coordination: Coordination normal.     Gait: Gait normal.     Deep Tendon Reflexes: Reflexes are normal and symmetric. Reflexes normal.  Psychiatric:        Mood and Affect: Mood normal.        Cognition and Memory: Cognition and memory normal.           Assessment & Plan:   Problem List Items Addressed This Visit       Cardiovascular and Mediastinum   Essential hypertension   bp in fair control at this time  BP Readings from Last 1 Encounters:  10/16/23 130/78   No changes needed Most recent labs reviewed  Disc lifstyle change with low sodium diet and exercise  Plan to continue losartan  50 mg daily and triam hct  75-50  mg half pill daily      Relevant Medications   losartan  (COZAAR ) 50 MG tablet   rosuvastatin  (CRESTOR ) 10 MG tablet   triamterene -hydrochlorothiazide (MAXZIDE) 75-50 MG tablet     Endocrine   Hyperlipidemia associated with type 2 diabetes mellitus (HCC)   Disc goals for lipids and reasons to control them Rev last labs with pt Rev low sat fat diet in detail  Well controlled with crestor  10 mg dialy  LDL of 50       Relevant Medications   losartan  (  COZAAR ) 50 MG tablet   metFORMIN  (GLUCOPHAGE ) 500 MG tablet   rosuvastatin  (CRESTOR ) 10 MG tablet   triamterene -hydrochlorothiazide (MAXZIDE) 75-50 MG tablet   Controlled type 2 diabetes mellitus without complication, without long-term current use of insulin (HCC)   Lab Results  Component Value Date   HGBA1C 6.6 (H) 10/07/2023   HGBA1C 6.1 (A) 04/10/2023   HGBA1C 6.3 10/03/2022   Up per pt due to holiday eating  Discussed low glycemic diet  Eye exam due April/ goes it here On arb and statin   Metformin  500 mg bid   Encouraged to add more exercise  U microalb is in normal range  Follow up 3 months       Relevant Medications   losartan  (COZAAR ) 50 MG tablet   metFORMIN  (GLUCOPHAGE ) 500 MG tablet   rosuvastatin  (CRESTOR ) 10 MG tablet     Other   Routine general medical examination at a health care facility - Primary   Reviewed health habits including diet and exercise and skin cancer prevention Reviewed appropriate screening tests for age  Also reviewed health mt list, fam hx and immunization status , as well as social and family history   See HPI Labs reviewed and ordered Health Maintenance  Topic Date Due   Pneumococcal Vaccination (1 of 2 - PCV) Never done   Zoster (Shingles) Vaccine (1 of 2) Never done   Hepatitis C Screening  10/15/2024*   COVID-19 Vaccine (8 - 2024-25 season) 10/31/2024*   Eye exam for diabetics  01/03/2024   Mammogram  03/14/2024   Hemoglobin A1C  04/05/2024   Complete foot exam   04/09/2024    Pap with HPV screening  06/19/2024   Yearly kidney function blood test for diabetes  10/06/2024   Yearly kidney health urinalysis for diabetes  10/06/2024   Colon Cancer Screening  04/04/2025   DTaP/Tdap/Td vaccine (3 - Td or Tdap) 10/10/2032   Flu Shot  Completed   HIV Screening  Completed   HPV Vaccine  Aged Out  *Topic was postponed. The date shown is not the original due date.   Prevnar 20 vaccine today  Considering shingrix if covered  Pap 2022-will send for report in case it has been more recent  Discussed fall prevention, supplements and exercise for bone density  PHQ 0         Obesity due to excess calories   Discussed how this problem influences overall health and the risks it imposes  Reviewed plan for weight loss with lower calorie diet (via better food choices (lower glycemic and portion control) along with exercise building up to or more than 30 minutes 5 days per week including some aerobic activity and strength training   Will add strength training       Relevant Medications   metFORMIN  (GLUCOPHAGE ) 500 MG tablet   Malignant neoplasm of lower-outer quadrant of 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, estrogen receptor positive (HCC)   Doing well  Had recent bx for right nipple discharge that was negative  Next mammo due in July Continues gyn care as well      Colon cancer screening   Colonoscopy 03/2022 with 3 y recall for polyps

## 2023-10-16 NOTE — Patient Instructions (Addendum)
 If you are interested in the shingles vaccine series (Shingrix), call your insurance or pharmacy to check on coverage and location it must be given.  If affordable - you can schedule it here or at your pharmacy depending on coverage   Pneumonia shot today (prevnar)   Try to get 1200-1500 mg of calcium  per day with at least 2000 iu of vitamin D  - for bone health  Add some strength training to your routine, this is important for bone and brain health and can reduce your risk of falls and help your body use insulin properly and regulate weight  Light weights, exercise bands , and internet videos are a good way to start  Yoga (chair or regular), machines , floor exercises or a gym with machines are also good options   Get back on track with diet  Try to get most of your carbohydrates from produce (with the exception of white potatoes) and whole grains Eat less bread/pasta/rice/snack foods/cereals/sweets and other items from the middle of the grocery store (processed carbs)   You are due for an eye exam in April  If you don't hear from us  by march give us  a call

## 2023-11-07 ENCOUNTER — Other Ambulatory Visit: Payer: Self-pay | Admitting: Hematology and Oncology

## 2023-11-07 DIAGNOSIS — C50512 Malignant neoplasm of lower-outer quadrant of left female breast: Secondary | ICD-10-CM

## 2023-11-07 NOTE — Progress Notes (Signed)
 Repeat mammo and [place] ordered for 6 months. Referral placed to Dr Carolynne Edouard.  Lindsey Wright

## 2024-01-07 ENCOUNTER — Ambulatory Visit: Payer: Self-pay | Admitting: General Surgery

## 2024-01-07 DIAGNOSIS — D241 Benign neoplasm of right breast: Secondary | ICD-10-CM

## 2024-01-08 ENCOUNTER — Other Ambulatory Visit: Payer: Self-pay | Admitting: General Surgery

## 2024-01-08 DIAGNOSIS — D241 Benign neoplasm of right breast: Secondary | ICD-10-CM

## 2024-01-15 ENCOUNTER — Telehealth: Payer: Self-pay | Admitting: *Deleted

## 2024-01-15 NOTE — Telephone Encounter (Signed)
 Copied from CRM [id number]. Topic: Clinical - Medical Advice >> Jan 15, 2024  9:14 AM Juleen Oakland F wrote: Reason for CRM: Patient last seen 10/16/23 with Dr. Malissa Se, she's following up to see how she can get her free diabetic eye exam scheduled? She also wants to know if Dr. Malissa Se wants to see her again to check her levels? Please call her at [phone].

## 2024-01-16 NOTE — Telephone Encounter (Signed)
 [facility] eye exams have halted for an unknown period of time.

## 2024-01-16 NOTE — Telephone Encounter (Signed)
 Front office: please see prev message pt is due for DM f/u when able. Please schedule   Will await Kate's response regarding DM eye exam

## 2024-01-16 NOTE — Telephone Encounter (Signed)
 Spoke to pt, scheduled f/u for 01/22/24. Relayed message regarding eye exam

## 2024-01-16 NOTE — Telephone Encounter (Signed)
 Due for a follow up any time for diabetes (for visit and A1c)   Unsure if the [facility] eye exam project is on hold or not-will cc to [employer]

## 2024-01-22 ENCOUNTER — Ambulatory Visit: Admitting: Family Medicine

## 2024-01-22 ENCOUNTER — Encounter: Payer: Self-pay | Admitting: Family Medicine

## 2024-01-22 VITALS — BP 130/78 | HR 65 | Temp 98.4°F | Ht 66.0 in | Wt 184.2 lb

## 2024-01-22 DIAGNOSIS — I10 Essential (primary) hypertension: Secondary | ICD-10-CM

## 2024-01-22 DIAGNOSIS — E1169 Type 2 diabetes mellitus with other specified complication: Secondary | ICD-10-CM | POA: Diagnosis not present

## 2024-01-22 DIAGNOSIS — D249 Benign neoplasm of unspecified breast: Secondary | ICD-10-CM | POA: Diagnosis not present

## 2024-01-22 DIAGNOSIS — E785 Hyperlipidemia, unspecified: Secondary | ICD-10-CM

## 2024-01-22 DIAGNOSIS — Z7984 Long term (current) use of oral hypoglycemic drugs: Secondary | ICD-10-CM

## 2024-01-22 DIAGNOSIS — E119 Type 2 diabetes mellitus without complications: Secondary | ICD-10-CM

## 2024-01-22 LAB — POCT GLYCOSYLATED HEMOGLOBIN (HGB A1C): Hemoglobin A1C: 6.1 % — AB (ref 4.0–5.6)

## 2024-01-22 NOTE — Assessment & Plan Note (Signed)
 Disc goals for lipids and reasons to control them Rev last labs with pt Rev low sat fat diet in detail   Crestor  10 mg daily  LDL of 50

## 2024-01-22 NOTE — Addendum Note (Signed)
 Addended by: Deri Fleet A on: 01/22/2024 01:52 PM   Modules accepted: Orders

## 2024-01-22 NOTE — Telephone Encounter (Signed)
 I put the referral in for oph Please let us  know if you don't hear in 1-2 weeks to set that up

## 2024-01-22 NOTE — Assessment & Plan Note (Signed)
 Lab Results  Component Value Date   HGBA1C 6.1 (A) 01/22/2024   HGBA1C 6.6 (H) 10/07/2023   HGBA1C 6.1 (A) 04/10/2023   Continue metformin  500 mg bid  Better diet and exercise/commended Discussed plan for weight training   Pt will schedule eye exam  Good foot care On arb and statin

## 2024-01-22 NOTE — Assessment & Plan Note (Addendum)
 bp in fair control at this time  BP Readings from Last 1 Encounters:  01/22/24 130/78   No changes needed Most recent labs reviewed  Disc lifstyle change with low sodium diet and exercise  Plan to continue losartan  50 mg daily and triam hct  75-50 mg half pill daily  Last labs reviewed with GFR over 60

## 2024-01-22 NOTE — Patient Instructions (Addendum)
 Make sure to schedule your annual eye exam  If you end up needing a referral let us  know   Keep working on exercise  Try weight routines with low weight to start   Keep eating better Try to get most of your carbohydrates from produce (with the exception of white potatoes) and whole grains Eat less bread/pasta/rice/snack foods/cereals/sweets and other items from the middle of the grocery store (processed carbs)   No change in medicines

## 2024-01-22 NOTE — Progress Notes (Signed)
 Subjective:    Patient ID: Lindsey Wright, female    DOB: June 28, 1966, 58 y.o.   MRN: [id number]  HPI  Wt Readings from Last 3 Encounters:  01/22/24 184 lb 4 oz (83.6 kg)  10/16/23 186 lb 4 oz (84.5 kg)  05/23/23 187 lb (84.8 kg)   29.74 kg/m  Vitals:   01/22/24 0757 01/22/24 0820  BP: (!) 142/76 130/78  Pulse: 65   Temp: 98.4 F (36.9 C)   SpO2: 98%     Pt presents for follow up of DM2 and HTN  and chronic medical problems   Planning surgery for intraductal papilloma / left breast cancer in past    HTN bp is stable today  No cp or palpitations or headaches or edema  No side effects to medicines  BP Readings from Last 3 Encounters:  01/22/24 130/78  10/16/23 130/78  05/23/23 139/79    Losartan  50 mg dialy  Triam hct 75-50 mg daily   Lab Results  Component Value Date   NA 139 10/07/2023   K 3.5 10/07/2023   CO2 30 10/07/2023   GLUCOSE 111 (H) 10/07/2023   BUN 15 10/07/2023   CREATININE 0.93 10/07/2023   CALCIUM  9.6 10/07/2023   GFR 68.41 10/07/2023   GFRNONAA >60 04/16/2022   Lab Results  Component Value Date   ALT 20 10/07/2023   AST 24 10/07/2023   ALKPHOS 78 10/07/2023   BILITOT 0.7 10/07/2023    DM2 Lab Results  Component Value Date   HGBA1C 6.1 (A) 01/22/2024   HGBA1C 6.6 (H) 10/07/2023   HGBA1C 6.1 (A) 04/10/2023   Down to 6.1 today  Metformin  500 mg bid   Lab Results  Component Value Date   MICROALBUR 2.2 (H) 10/07/2023   MICROALBUR 1.3 10/03/2022  Normal ratio  Needs to schedule her eye exam since the [facility] project stopped   Feeling pretty good  Doing more strength building exercise  Working outdoors   Is eating better  Cutting sugar intake  Her daughters help motivate her  Drinking more water also    Hyperlipidemia Lab Results  Component Value Date   CHOL 114 10/07/2023   HDL 49.10 10/07/2023   LDLCALC 50 10/07/2023   TRIG 76.0 10/07/2023   CHOLHDL 2 10/07/2023   Crestor  10 mg daily      Patient  Active Problem List   Diagnosis Date Noted   Intraductal papilloma of breast 01/22/2024   Routine general medical examination at a health care facility 10/10/2022   Hyperlipidemia associated with type 2 diabetes mellitus (HCC) 10/04/2019   Low back pain 11/17/2018   Malignant neoplasm of lower-outer quadrant of 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, estrogen receptor positive (HCC) 12/26/2017   Colon cancer screening 01/29/2017   Controlled type 2 diabetes mellitus without complication, without long-term current use of insulin (HCC) 09/25/2013   Obesity due to excess calories 09/25/2013   Essential hypertension 08/15/2007   Past Medical History:  Diagnosis Date   Anemia    iron deficient anemia   Breast cancer (HCC)    left   Diabetes mellitus without complication (HCC)    History of Bell's palsy    x 2 as teenager and in 20's, last 2016   Hyperlipidemia    on meds   Hypertension    Menorrhagia    Overweight(278.02)    Peripheral edema    Personal history of chemotherapy    Personal history of radiation therapy    Past Surgical History:  Procedure  Laterality Date   BREAST BIOPSY Left 12/2017   BREAST BIOPSY Left 02/26/2020   BREAST BIOPSY Right 10/14/2023   US  RT BREAST BX W LOC DEV 1ST LESION IMG BX SPEC US  GUIDE 10/14/2023 [facility]   BREAST LUMPECTOMY Left 2019   BREAST LUMPECTOMY WITH RADIOACTIVE SEED AND SENTINEL LYMPH NODE BIOPSY Left 06/16/2018   Procedure: LEFT BREAST LUMPECTOMY WITH RADIOACTIVE SEED X'S 2 WITH SEED TARGETED LYMPH NODE EXCISION AND SENTINEL LYMPH NODE BIOPSY;  Surgeon: Caralyn Chandler, MD;  Location: [facility];  Service: General;  Laterality: Left;   CESAREAN SECTION CLASSICAL     x2   CHOLECYSTECTOMY     COLONOSCOPY     POLYPECTOMY     PORT-A-CATH REMOVAL Right 10/30/2018   Procedure: REMOVAL PORT-A-CATH;  Surgeon: Caralyn Chandler, MD;  Location: [facility];  Service: General;  Laterality: Right;   PORTACATH PLACEMENT Right  01/17/2018   Procedure: INSERTION PORT-A-CATH;  Surgeon: Caralyn Chandler, MD;  Location: [facility];  Service: General;  Laterality: Right;   TUBAL LIGATION     Social History   Tobacco Use   Smoking status: Never   Smokeless tobacco: Never  Vaping Use   Vaping status: Never Used  Substance Use Topics   Alcohol use: No   Drug use: No   Family History  Problem Relation Age of Onset   Hypertension Mother    Diabetes Mother    Stroke Mother    Alcohol abuse Father    Cancer Father        lung ca   Hypertension Father    Hypertension Sister    Colon cancer Neg Hx    Colon polyps Neg Hx    Rectal cancer Neg Hx    Stomach cancer Neg Hx    Esophageal cancer Neg Hx    Allergies  Allergen Reactions   Ace Inhibitors Cough   Advil [Ibuprofen]     Hives - states able to tolerate 200mg  pill without any sxs    Current Outpatient Medications on File Prior to Visit  Medication Sig Dispense Refill   Blood Glucose Monitoring Suppl (ONETOUCH VERIO FLEX SYSTEM) w/Device KIT To check glucose twice daily and prn for uncontrolled diabetes type 2 (E11.9) 1 kit 0   Calcium -Vitamin D -Vitamin K (VIACTIV PO) Take by mouth. 1 chew BID     glucose blood (ONETOUCH VERIO) test strip CHECK BLOOD SUGAR TWICE DAILY 100 strip 1   Lancets (ONETOUCH ULTRASOFT) lancets To check glucose twice daily and prn for uncontrolled diabetes 2 (E11.9) 100 each 1   losartan  (COZAAR ) 50 MG tablet Take 1 tablet (50 mg total) by mouth daily. 90 tablet 3   metFORMIN  (GLUCOPHAGE ) 500 MG tablet Take 1 tablet (500 mg total) by mouth 2 (two) times daily with a meal. 180 tablet 3   potassium chloride  SA (KLOR-CON  M20) 20 MEQ tablet Take 2 tablets (40 mEq total) by mouth daily. 180 tablet 3   rosuvastatin  (CRESTOR ) 10 MG tablet Take 1 tablet (10 mg total) by mouth daily. 90 tablet 3   triamterene -hydrochlorothiazide (MAXZIDE) 75-50 MG tablet Take 0.5 tablets by mouth daily. 45 tablet 3   No current  facility-administered medications on file prior to visit.    Review of Systems  Constitutional:  Negative for activity change, appetite change, fatigue, fever and unexpected weight change.  HENT:  Negative for congestion, ear pain, rhinorrhea, sinus pressure and sore throat.   Eyes:  Negative for pain,  redness and visual disturbance.  Respiratory:  Negative for cough, shortness of breath and wheezing.   Cardiovascular:  Negative for chest pain and palpitations.  Gastrointestinal:  Negative for abdominal pain, blood in stool, constipation and diarrhea.  Endocrine: Negative for polydipsia and polyuria.  Genitourinary:  Negative for dysuria, frequency and urgency.  Musculoskeletal:  Negative for arthralgias, back pain and myalgias.  Skin:  Negative for pallor and rash.  Allergic/Immunologic: Negative for environmental allergies.  Neurological:  Negative for dizziness, syncope and headaches.  Hematological:  Negative for adenopathy. Does not bruise/bleed easily.  Psychiatric/Behavioral:  Negative for decreased concentration and dysphoric mood. The patient is not nervous/anxious.        Some worry re: upcoming breast surgery        Objective:   Physical Exam Constitutional:      General: She is not in acute distress.    Appearance: Normal appearance. She is well-developed. She is not ill-appearing or diaphoretic.     Comments: Over weight   HENT:     Head: Normocephalic and atraumatic.  Eyes:     Conjunctiva/sclera: Conjunctivae normal.     Pupils: Pupils are equal, round, and reactive to light.  Neck:     Thyroid : No thyromegaly.     Vascular: No carotid bruit or JVD.  Cardiovascular:     Rate and Rhythm: Normal rate and regular rhythm.     Heart sounds: Normal heart sounds.     No gallop.  Pulmonary:     Effort: Pulmonary effort is normal. No respiratory distress.     Breath sounds: Normal breath sounds. No wheezing or rales.  Abdominal:     General: There is no distension or  abdominal bruit.     Palpations: Abdomen is soft.  Musculoskeletal:     Cervical back: Normal range of motion and neck supple.     Right lower leg: No edema.     Left lower leg: No edema.  Lymphadenopathy:     Cervical: No cervical adenopathy.  Skin:    General: Skin is warm and dry.     Coloration: Skin is not pale.     Findings: No rash.  Neurological:     Mental Status: She is alert.     Coordination: Coordination normal.     Deep Tendon Reflexes: Reflexes are normal and symmetric. Reflexes normal.  Psychiatric:        Mood and Affect: Mood normal.           Assessment & Plan:   Problem List Items Addressed This Visit       Cardiovascular and Mediastinum   Essential hypertension   bp in fair control at this time  BP Readings from Last 1 Encounters:  01/22/24 130/78   No changes needed Most recent labs reviewed  Disc lifstyle change with low sodium diet and exercise  Plan to continue losartan  50 mg daily and triam hct  75-50 mg half pill daily  Last labs reviewed with GFR over 60        Endocrine   Hyperlipidemia associated with type 2 diabetes mellitus (HCC)   Disc goals for lipids and reasons to control them Rev last labs with pt Rev low sat fat diet in detail   Crestor  10 mg daily  LDL of 50       Controlled type 2 diabetes mellitus without complication, without long-term current use of insulin (HCC) - Primary   Lab Results  Component Value Date   HGBA1C  6.1 (A) 01/22/2024   HGBA1C 6.6 (H) 10/07/2023   HGBA1C 6.1 (A) 04/10/2023   Continue metformin  500 mg bid  Better diet and exercise/commended Discussed plan for weight training   Pt will schedule eye exam  Good foot care On arb and statin        Relevant Orders   POCT HgB A1C (Completed)     Other   Intraductal papilloma of breast   Not malignant but planning surgery for this given past personal history of breast cancer

## 2024-01-22 NOTE — Telephone Encounter (Signed)
 Copied from CRM [id number]. Topic: General - Other >> Jan 22, 2024  9:48 AM Adonis Hoot wrote: Reason for CRM: Patient was advised by PCP to call back to receive recommendation of eye doctors that she may be able to go and see. She would prefer to see an eye doctor in [place]. Please advise

## 2024-01-22 NOTE — Assessment & Plan Note (Signed)
 Not malignant but planning surgery for this given past personal history of breast cancer

## 2024-01-22 NOTE — Telephone Encounter (Signed)
 Pt notified referral done and let us  know if she doesn't get a call/letter within the next 2 weeks

## 2024-01-24 ENCOUNTER — Other Ambulatory Visit: Payer: Self-pay

## 2024-01-24 ENCOUNTER — Encounter (HOSPITAL_BASED_OUTPATIENT_CLINIC_OR_DEPARTMENT_OTHER): Payer: Self-pay | Admitting: General Surgery

## 2024-01-27 ENCOUNTER — Encounter (HOSPITAL_BASED_OUTPATIENT_CLINIC_OR_DEPARTMENT_OTHER)
Admission: RE | Admit: 2024-01-27 | Discharge: 2024-01-27 | Disposition: A | Discharge status: Home / Self Care | Source: Ambulatory Visit | Attending: General Surgery | Admitting: General Surgery

## 2024-01-27 DIAGNOSIS — E119 Type 2 diabetes mellitus without complications: Secondary | ICD-10-CM | POA: Insufficient documentation

## 2024-01-27 DIAGNOSIS — I10 Essential (primary) hypertension: Secondary | ICD-10-CM | POA: Diagnosis not present

## 2024-01-27 DIAGNOSIS — Z01818 Encounter for other preprocedural examination: Secondary | ICD-10-CM | POA: Diagnosis present

## 2024-01-27 LAB — BASIC METABOLIC PANEL WITH GFR
Anion gap: 10 (ref 5–15)
BUN: 15 mg/dL (ref 6–20)
CO2: 26 mmol/L (ref 22–32)
Calcium: 9.7 mg/dL (ref 8.9–10.3)
Chloride: 103 mmol/L (ref 98–111)
Creatinine, Ser: 1.08 mg/dL — ABNORMAL HIGH (ref 0.44–1.00)
GFR, Estimated: 60 mL/min — ABNORMAL LOW (ref >60)
Glucose, Bld: 104 mg/dL — ABNORMAL HIGH (ref 70–99)
Potassium: 3.8 mmol/L (ref 3.5–5.1)
Sodium: 139 mmol/L (ref 135–145)

## 2024-01-27 MED ORDER — CHLORHEXIDINE GLUCONATE CLOTH 2 % EX PADS: 6.0000 | MEDICATED_PAD | Freq: Once | CUTANEOUS | Status: DC

## 2024-01-27 NOTE — Progress Notes (Signed)

## 2024-01-28 ENCOUNTER — Ambulatory Visit
Admission: RE | Admit: 2024-01-28 | Discharge: 2024-01-28 | Disposition: A | Discharge status: Home / Self Care | Source: Ambulatory Visit | Attending: General Surgery | Admitting: General Surgery

## 2024-01-28 DIAGNOSIS — D241 Benign neoplasm of right breast: Secondary | ICD-10-CM

## 2024-01-31 ENCOUNTER — Ambulatory Visit (HOSPITAL_BASED_OUTPATIENT_CLINIC_OR_DEPARTMENT_OTHER)
Admission: RE | Admit: 2024-01-31 | Discharge: 2024-01-31 | Disposition: A | Discharge status: Home / Self Care | Attending: General Surgery | Admitting: General Surgery

## 2024-01-31 ENCOUNTER — Other Ambulatory Visit: Payer: Self-pay

## 2024-01-31 ENCOUNTER — Encounter (HOSPITAL_BASED_OUTPATIENT_CLINIC_OR_DEPARTMENT_OTHER)
Admission: RE | Disposition: A | Discharge status: Home / Self Care | Payer: Self-pay | Source: Home / Self Care | Attending: General Surgery

## 2024-01-31 ENCOUNTER — Ambulatory Visit
Admission: RE | Admit: 2024-01-31 | Discharge: 2024-01-31 | Disposition: A | Discharge status: Home / Self Care | Source: Ambulatory Visit | Attending: General Surgery | Admitting: General Surgery

## 2024-01-31 ENCOUNTER — Ambulatory Visit (HOSPITAL_BASED_OUTPATIENT_CLINIC_OR_DEPARTMENT_OTHER): Admitting: Anesthesiology

## 2024-01-31 ENCOUNTER — Encounter (HOSPITAL_BASED_OUTPATIENT_CLINIC_OR_DEPARTMENT_OTHER): Payer: Self-pay | Admitting: General Surgery

## 2024-01-31 DIAGNOSIS — Z79899 Other long term (current) drug therapy: Secondary | ICD-10-CM | POA: Insufficient documentation

## 2024-01-31 DIAGNOSIS — D241 Benign neoplasm of right breast: Secondary | ICD-10-CM

## 2024-01-31 DIAGNOSIS — Z7984 Long term (current) use of oral hypoglycemic drugs: Secondary | ICD-10-CM | POA: Diagnosis not present

## 2024-01-31 DIAGNOSIS — I10 Essential (primary) hypertension: Secondary | ICD-10-CM | POA: Insufficient documentation

## 2024-01-31 DIAGNOSIS — Z853 Personal history of malignant neoplasm of breast: Secondary | ICD-10-CM | POA: Diagnosis not present

## 2024-01-31 DIAGNOSIS — Z9889 Other specified postprocedural states: Secondary | ICD-10-CM | POA: Diagnosis not present

## 2024-01-31 DIAGNOSIS — E119 Type 2 diabetes mellitus without complications: Secondary | ICD-10-CM | POA: Diagnosis not present

## 2024-01-31 DIAGNOSIS — N6081 Other benign mammary dysplasias of right breast: Secondary | ICD-10-CM | POA: Diagnosis not present

## 2024-01-31 LAB — GLUCOSE, CAPILLARY
Glucose-Capillary: 95 mg/dL (ref 70–99)
Glucose-Capillary: 118 mg/dL — ABNORMAL HIGH (ref 70–99)

## 2024-01-31 SURGERY — BREAST LUMPECTOMY WITH RADIOACTIVE SEED LOCALIZATION
Anesthesia: General | Site: Breast | Laterality: Right

## 2024-01-31 MED ORDER — FENTANYL CITRATE (PF) 100 MCG/2ML IJ SOLN
INTRAMUSCULAR | Status: AC
  Filled 2024-01-31: qty 2

## 2024-01-31 MED ORDER — GABAPENTIN 100 MG PO CAPS
100.0000 mg | ORAL_CAPSULE | ORAL | Status: AC
  Administered 2024-01-31: 100 mg via ORAL

## 2024-01-31 MED ORDER — PROPOFOL 10 MG/ML IV BOLUS
INTRAVENOUS | Status: AC
  Filled 2024-01-31: qty 20

## 2024-01-31 MED ORDER — FENTANYL CITRATE (PF) 100 MCG/2ML IJ SOLN
INTRAMUSCULAR | Status: DC | PRN
  Administered 2024-01-31: 50 ug via INTRAVENOUS

## 2024-01-31 MED ORDER — OXYCODONE HCL 5 MG PO TABS: 5.0000 mg | ORAL_TABLET | Freq: Four times a day (QID) | ORAL | 0 refills | Status: DC | PRN

## 2024-01-31 MED ORDER — EPHEDRINE SULFATE-NACL 50-0.9 MG/10ML-% IV SOSY
PREFILLED_SYRINGE | INTRAVENOUS | Status: DC | PRN
Start: 2024-01-31 — End: 2024-01-31
  Administered 2024-01-31: 10 mg via INTRAVENOUS

## 2024-01-31 MED ORDER — LIDOCAINE 2% (20 MG/ML) 5 ML SYRINGE
INTRAMUSCULAR | Status: DC | PRN
  Administered 2024-01-31: 60 mg via INTRAVENOUS

## 2024-01-31 MED ORDER — OXYCODONE HCL 5 MG/5ML PO SOLN: 5.0000 mg | Freq: Once | ORAL | Status: DC | PRN

## 2024-01-31 MED ORDER — ACETAMINOPHEN 500 MG PO TABS
ORAL_TABLET | ORAL | Status: AC
  Filled 2024-01-31: qty 2

## 2024-01-31 MED ORDER — MIDAZOLAM HCL 2 MG/2ML IJ SOLN
INTRAMUSCULAR | Status: DC | PRN
  Administered 2024-01-31: 2 mg via INTRAVENOUS

## 2024-01-31 MED ORDER — EPHEDRINE 5 MG/ML INJ
INTRAVENOUS | Status: AC
  Filled 2024-01-31: qty 5

## 2024-01-31 MED ORDER — ACETAMINOPHEN 10 MG/ML IV SOLN: 1000.0000 mg | Freq: Once | INTRAVENOUS | Status: DC | PRN

## 2024-01-31 MED ORDER — GABAPENTIN 100 MG PO CAPS
ORAL_CAPSULE | ORAL | Status: AC
  Filled 2024-01-31: qty 1

## 2024-01-31 MED ORDER — CEFAZOLIN SODIUM-DEXTROSE 2-4 GM/100ML-% IV SOLN
2.0000 g | INTRAVENOUS | Status: AC
  Administered 2024-01-31: 2 g via INTRAVENOUS

## 2024-01-31 MED ORDER — BUPIVACAINE-EPINEPHRINE (PF) 0.25% -1:200000 IJ SOLN
INTRAMUSCULAR | Status: DC | PRN
  Administered 2024-01-31: 19 mL via PERINEURAL

## 2024-01-31 MED ORDER — CEFAZOLIN SODIUM-DEXTROSE 2-4 GM/100ML-% IV SOLN
INTRAVENOUS | Status: AC
  Filled 2024-01-31: qty 100

## 2024-01-31 MED ORDER — ONDANSETRON HCL 4 MG/2ML IJ SOLN
INTRAMUSCULAR | Status: AC
  Filled 2024-01-31: qty 2

## 2024-01-31 MED ORDER — DEXAMETHASONE SODIUM PHOSPHATE 10 MG/ML IJ SOLN
INTRAMUSCULAR | Status: AC
  Filled 2024-01-31: qty 1

## 2024-01-31 MED ORDER — FENTANYL CITRATE (PF) 100 MCG/2ML IJ SOLN: 25.0000 ug | INTRAMUSCULAR | Status: DC | PRN

## 2024-01-31 MED ORDER — DEXAMETHASONE SODIUM PHOSPHATE 10 MG/ML IJ SOLN
INTRAMUSCULAR | Status: DC | PRN
  Administered 2024-01-31: 4 mg via INTRAVENOUS

## 2024-01-31 MED ORDER — ACETAMINOPHEN 500 MG PO TABS
1000.0000 mg | ORAL_TABLET | ORAL | Status: AC
  Administered 2024-01-31: 1000 mg via ORAL

## 2024-01-31 MED ORDER — PROPOFOL 10 MG/ML IV BOLUS
INTRAVENOUS | Status: DC | PRN
  Administered 2024-01-31: 160 mg via INTRAVENOUS

## 2024-01-31 MED ORDER — LACTATED RINGERS IV SOLN: INTRAVENOUS | Status: DC

## 2024-01-31 MED ORDER — ONDANSETRON HCL 4 MG/2ML IJ SOLN
INTRAMUSCULAR | Status: DC | PRN
  Administered 2024-01-31: 4 mg via INTRAVENOUS

## 2024-01-31 MED ORDER — OXYCODONE HCL 5 MG PO TABS: 5.0000 mg | ORAL_TABLET | Freq: Once | ORAL | Status: DC | PRN

## 2024-01-31 MED ORDER — MIDAZOLAM HCL 2 MG/2ML IJ SOLN
INTRAMUSCULAR | Status: AC
  Filled 2024-01-31: qty 2

## 2024-01-31 MED ORDER — LIDOCAINE 2% (20 MG/ML) 5 ML SYRINGE
INTRAMUSCULAR | Status: AC
Start: 2024-01-31 — End: ?
  Filled 2024-01-31: qty 5

## 2024-01-31 MED ORDER — PROPOFOL 500 MG/50ML IV EMUL
INTRAVENOUS | Status: DC | PRN
  Administered 2024-01-31: 150 ug/kg/min via INTRAVENOUS

## 2024-01-31 SURGICAL SUPPLY — 32 items
BLADE SURG 15 STRL LF DISP TIS (BLADE) ×2 IMPLANT
CANISTER SUC SOCK COL 7IN (MISCELLANEOUS) ×2 IMPLANT
CANISTER SUCT 1200ML W/VALVE (MISCELLANEOUS) ×2 IMPLANT
CHLORAPREP W/TINT 26 (MISCELLANEOUS) ×2 IMPLANT
CLIP APPLIE 9.375 MED OPEN (MISCELLANEOUS) IMPLANT
COVER BACK TABLE 60X90IN (DRAPES) ×2 IMPLANT
COVER MAYO STAND STRL (DRAPES) ×2 IMPLANT
COVER PROBE CYLINDRICAL 5X96 (MISCELLANEOUS) ×2 IMPLANT
DERMABOND ADVANCED .7 DNX12 (GAUZE/BANDAGES/DRESSINGS) ×2 IMPLANT
DRAPE LAPAROSCOPIC ABDOMINAL (DRAPES) ×2 IMPLANT
DRAPE UTILITY XL STRL (DRAPES) ×2 IMPLANT
ELECT COATED BLADE 2.86 ST (ELECTRODE) ×2 IMPLANT
ELECTRODE REM PT RTRN 9FT ADLT (ELECTROSURGICAL) ×2 IMPLANT
GLOVE BIO SURGEON STRL SZ7.5 (GLOVE) ×4 IMPLANT
GOWN STRL REUS W/ TWL LRG LVL3 (GOWN DISPOSABLE) ×4 IMPLANT
KIT MARKER MARGIN INK (KITS) ×2 IMPLANT
NDL HYPO 25X1 1.5 SAFETY (NEEDLE) IMPLANT
NEEDLE HYPO 25X1 1.5 SAFETY (NEEDLE) IMPLANT
NS IRRIG 1000ML POUR BTL (IV SOLUTION) IMPLANT
PACK BASIN DAY SURGERY FS (CUSTOM PROCEDURE TRAY) ×2 IMPLANT
PENCIL SMOKE EVACUATOR (MISCELLANEOUS) ×2 IMPLANT
SLEEVE SCD COMPRESS KNEE MED (STOCKING) ×2 IMPLANT
SPIKE FLUID TRANSFER (MISCELLANEOUS) IMPLANT
SPONGE T-LAP 18X18 ~~LOC~~+RFID (SPONGE) ×2 IMPLANT
SUT MON AB 4-0 PC3 18 (SUTURE) ×2 IMPLANT
SUT SILK 2 0 SH (SUTURE) IMPLANT
SUT VICRYL 3-0 CR8 SH (SUTURE) ×2 IMPLANT
SYR CONTROL 10ML LL (SYRINGE) IMPLANT
TOWEL GREEN STERILE FF (TOWEL DISPOSABLE) ×2 IMPLANT
TRAY FAXITRON CT DISP (TRAY / TRAY PROCEDURE) ×2 IMPLANT
TUBE CONNECTING 20X1/4 (TUBING) ×2 IMPLANT
YANKAUER SUCT BULB TIP NO VENT (SUCTIONS) IMPLANT

## 2024-01-31 NOTE — Anesthesia Preprocedure Evaluation (Signed)
 Anesthesia Evaluation  Patient identified by MRN, date of birth, ID band Patient awake    Reviewed: Allergy & Precautions, NPO status , Patient's Chart, lab work & pertinent test results  History of Anesthesia Complications Negative for: history of anesthetic complications  Airway Mallampati: II  TM Distance: >3 FB Neck ROM: Full    Dental  (+) Dental Advisory Given, Teeth Intact   Pulmonary neg shortness of breath, neg sleep apnea, neg COPD, neg recent URI   breath sounds clear to auscultation       Cardiovascular hypertension, Pt. on medications (-) angina (-) Past MI and (-) CHF  Rhythm:Regular     Neuro/Psych negative neurological ROS  negative psych ROS   GI/Hepatic negative GI ROS, Neg liver ROS,,,  Endo/Other  diabetes, Type 2    Renal/GU Lab Results      Component                Value               Date                      NA                       139                 01/27/2024                K                        3.8                 01/27/2024                CO2                      26                  01/27/2024                GLUCOSE                  104 (H)             01/27/2024                BUN                      15                  01/27/2024                CREATININE               1.08 (H)            01/27/2024                CALCIUM                   9.7                 01/27/2024                GFR                      68.41  10/07/2023                GFRNONAA                 60 (L)              01/27/2024                Musculoskeletal negative musculoskeletal ROS (+)    Abdominal   Peds  Hematology negative hematology ROS (+) Lab Results      Component                Value               Date                      WBC                      6.6                 10/07/2023                HGB                      14.0                10/07/2023                HCT                       42.7                10/07/2023                MCV                      93.8                10/07/2023                PLT                      246.0               10/07/2023              Anesthesia Other Findings   Reproductive/Obstetrics                             Anesthesia Physical Anesthesia Plan  ASA: 2  Anesthesia Plan: General   Post-op Pain Management: Tylenol  PO (pre-op)* and Gabapentin  PO (pre-op)*   Induction: Intravenous  PONV Risk Score and Plan: 4 or greater and Ondansetron , Dexamethasone , Propofol  infusion and TIVA  Airway Management Planned: LMA  Additional Equipment: None  Intra-op Plan:   Post-operative Plan: Extubation in OR  Informed Consent: I have reviewed the patients History and Physical, chart, labs and discussed the procedure including the risks, benefits and alternatives for the proposed anesthesia with the patient or authorized representative who has indicated his/her understanding and acceptance.     Dental advisory given  Plan Discussed with: CRNA  Anesthesia Plan Comments:        Anesthesia Quick Evaluation

## 2024-01-31 NOTE — Interval H&P Note (Signed)
 History and Physical Interval Note:  01/31/2024 7:43 AM  Lindsey Wright  has presented today for surgery, with the diagnosis of RIGHT BREAST PAPILLOMA.  The various methods of treatment have been discussed with the patient and family. After consideration of risks, benefits and other options for treatment, the patient has consented to  Procedure(s) with comments: BREAST LUMPECTOMY WITH RADIOACTIVE SEED LOCALIZATION (Right) - RIGHT BREAST RADIOACTIVE SEED LOCALIZED LUMPECTOMY as a surgical intervention.  The patient's history has been reviewed, patient examined, no change in status, stable for surgery.  I have reviewed the patient's chart and labs.  Questions were answered to the patient's satisfaction.     Lillette Reid III

## 2024-01-31 NOTE — Transfer of Care (Signed)
 Immediate Anesthesia Transfer of Care Note  Patient: Lindsey Wright  Procedure(s) Performed: Procedure(s) (LRB): BREAST LUMPECTOMY WITH RADIOACTIVE SEED LOCALIZATION (Right)  Patient Location: PACU  Anesthesia Type: General  Level of Consciousness: awake, oriented, sedated and patient cooperative  Airway & Oxygen Therapy: Patient Spontanous Breathing and Patient connected to face mask oxygen  Post-op Assessment: Report given to PACU RN and Post -op Vital signs reviewed and stable  Post vital signs: Reviewed and stable  Complications: No apparent anesthesia complications Last Vitals:  Vitals Value Taken Time  BP 128/70 01/31/24 0925  Temp    Pulse 88 01/31/24 0926  Resp 20 01/31/24 0926  SpO2 93 % 01/31/24 0926  Vitals shown include unfiled device data.  Last Pain:  Vitals:   01/31/24 0716  TempSrc: Temporal  PainSc: 0-No pain         Complications: No notable events documented.

## 2024-01-31 NOTE — Discharge Instructions (Signed)
  Post Anesthesia Home Care Instructions  Activity: Get plenty of rest for the remainder of the day. A responsible individual must stay with you for 24 hours following the procedure.  For the next 24 hours, DO NOT: -Drive a car -Advertising copywriter -Drink alcoholic beverages -Take any medication unless instructed by your physician -Make any legal decisions or sign important papers.  Meals: Start with liquid foods such as gelatin or soup. Progress to regular foods as tolerated. Avoid greasy, spicy, heavy foods. If nausea and/or vomiting occur, drink only clear liquids until the nausea and/or vomiting subsides. Call your physician if vomiting continues.  Special Instructions/Symptoms: Your throat may feel dry or sore from the anesthesia or the breathing tube placed in your throat during surgery. If this causes discomfort, gargle with warm salt water. The discomfort should disappear within 24 hours.  If you had a scopolamine  patch placed behind your ear for the management of post- operative nausea and/or vomiting:  1. The medication in the patch is effective for 72 hours, after which it should be removed.  Wrap patch in a tissue and discard in the trash. Wash hands thoroughly with soap and water. 2. You may remove the patch earlier than 72 hours if you experience unpleasant side effects which may include dry mouth, dizziness or visual disturbances. 3. Avoid touching the patch. Wash your hands with soap and water after contact with the patch.  No tylenol  until after 1:20pm today.

## 2024-01-31 NOTE — H&P (Signed)
 REFERRING PHYSICIAN: Zina Hilts* PROVIDER: Arlester Bence, MD MRN: [id number] DOB: 1966/01/19 Subjective   Chief Complaint: New Consultation  History of Present Illness: Lindsey Wright is a 58 y.o. female who is seen today as an office consultation for evaluation of New Consultation  We are asked to see the patient in consultation by Dr. Arno Bibles to evaluate her for a right breast papilloma. The patient is a 58 year old black female who is about 5 years status post left breast lumpectomy and sentinel node biopsy for a T1c N0 left breast cancer that was ER and PR negative and HER2 positive with a Ki-67 of 90%. On her recent screening mammogram she was found to have a 7 mm subareolar upper inner quadrant right breast mass as well as a small 2 to 3 mm mass also within the nipple. The larger mass was biopsied and came back as a papilloma. She did have 1 episode of bloody nipple discharge but has not had any since.  Review of Systems: A complete review of systems was obtained from the patient. I have reviewed this information and discussed as appropriate with the patient. See HPI as well for other ROS.  ROS   Medical History: Past Medical History:  Diagnosis Date  Diabetes mellitus without complication (CMS/HHS-HCC)  History of cancer  Hypertension   Patient Active Problem List  Diagnosis  History of left breast cancer  Papilloma of right breast   Past Surgical History:  Procedure Laterality Date  MASTECTOMY PARTIAL / LUMPECTOMY  Port a cath    Allergies  Allergen Reactions  Ace Inhibitors Cough  Ibuprofen Rash  Hives. Per patient she can take in small dose only  Hives - states able to tolerate 200mg  pill without any sxs   Current Outpatient Medications on File Prior to Visit  Medication Sig Dispense Refill  calcium  carb/vitamin D3/vit K1 (VIACTIV ORAL) Take by mouth  KLOR-CON  M20 20 mEq ER tablet Take 2 tablets by mouth once daily  losartan  (COZAAR ) 50 MG  tablet Take 1 tablet by mouth once daily  metFORMIN  (GLUCOPHAGE ) 500 MG tablet TAKE 1 TABLET BY MOUTH 2 TIMES DAILY WITH A MEAL.  rosuvastatin  (CRESTOR ) 10 MG tablet Take 1 tablet by mouth once daily  triamterene -hydroCHLOROthiazide (MAXZIDE) 75-50 mg tablet Take 0.5 tablets by mouth once daily   No current facility-administered medications on file prior to visit.   Family History  Problem Relation Age of Onset  Stroke Mother  High blood pressure (Hypertension) Mother  Diabetes Mother  High blood pressure (Hypertension) Father  High blood pressure (Hypertension) Sister    Social History   Tobacco Use  Smoking Status Never  Smokeless Tobacco Never    Social History   Socioeconomic History  Marital status: Married  Tobacco Use  Smoking status: Never  Smokeless tobacco: Never  Substance and Sexual Activity  Alcohol use: Never  Drug use: Never   Social Drivers of Health   Transportation Needs: No Transportation Needs (07/08/2018)  Received from [facility] - [facility]  Lack of Transportation (Medical): No  Lack of Transportation (Non-Medical): No  Housing Stability: Unknown (11/21/2023)  Housing Stability Vital Sign  Homeless in the Last Year: No   Objective:   Vitals:  BP: (!) 156/88  Pulse: 69  Temp: 36.3 C (97.3 F)  SpO2: 96%  Weight: 85.6 kg (188 lb 12.8 oz)  Height: 167.6 cm (5\' 6" )  PainSc: 0-No pain  PainLoc: Breast   Body mass index is 30.47 kg/m.  Physical Exam Vitals reviewed.  Constitutional:  General: She is not in acute distress. Appearance: Normal appearance.  HENT:  Head: Normocephalic and atraumatic.  Right Ear: External ear normal.  Left Ear: External ear normal.  Nose: Nose normal.  Mouth/Throat:  Mouth: Mucous membranes are moist.  Pharynx: Oropharynx is clear.  Eyes:  General: No scleral icterus. Extraocular Movements: Extraocular movements intact.  Conjunctiva/sclera: Conjunctivae normal.  Pupils: Pupils are  equal, round, and reactive to light.  Cardiovascular:  Rate and Rhythm: Normal rate and regular rhythm.  Pulses: Normal pulses.  Heart sounds: Normal heart sounds.  Pulmonary:  Effort: Pulmonary effort is normal. No respiratory distress.  Breath sounds: Normal breath sounds.  Abdominal:  General: Bowel sounds are normal.  Palpations: Abdomen is soft.  Tenderness: There is no abdominal tenderness.  Musculoskeletal:  General: No swelling, tenderness or deformity. Normal range of motion.  Cervical back: Normal range of motion and neck supple.  Skin: General: Skin is warm and dry.  Coloration: Skin is not jaundiced.  Neurological:  General: No focal deficit present.  Mental Status: She is alert and oriented to person, place, and time.  Psychiatric:  Mood and Affect: Mood normal.  Behavior: Behavior normal.     Breast: There is no palpable mass in the right breast. There is no reproducible discharge in the right breast today. The lateral left breast scar is firm secondary to radiation with some generalized thickening of the left breast. There is no discrete palpable mass in the left breast. There is no palpable axillary, supraclavicular, or cervical lymphadenopathy.  Labs, Imaging and Diagnostic Testing:  Assessment and Plan:   Diagnoses and all orders for this visit:  History of left breast cancer  Papilloma of right breast   The patient has a history of left breast cancer and has a new 7 mm papilloma in the upper inner subareolar right breast. Because of her history of breast cancer in the bloody nipple discharge my recommendation would be to have the papilloma removed. She has a second papilloma that is described as in the nipple which we could certainly look for and try to remove at the same time. I have discussed with her in detail the risks and benefits of the operation as well as some of the technical aspects including use of a radioactive seed for localization and she  understands. She would like to think about it and then let us  know. If she elects for observation then she will continue to do regular self exams and I will plan to see her back in about 6 months with repeat mammogram and ultrasound to document stability.

## 2024-01-31 NOTE — Op Note (Addendum)
 01/31/2024  9:18 AM  PATIENT:  Lindsey Wright  58 y.o. female  PRE-OPERATIVE DIAGNOSIS:  RIGHT BREAST PAPILLOMA  POST-OPERATIVE DIAGNOSIS:  RIGHT BREAST PAPILLOMA  PROCEDURE:  Procedure(s) with comments: RIGHT BREAST LUMPECTOMY WITH RADIOACTIVE SEED LOCALIZATION (Right)   SURGEON:  Surgeons and Role:    * Caralyn Chandler, MD - Primary  PHYSICIAN ASSISTANT:   ASSISTANTS: none   ANESTHESIA:   local and general  EBL:  5 mL   BLOOD ADMINISTERED:none  DRAINS: none   LOCAL MEDICATIONS USED:  MARCAINE      SPECIMEN:  Source of Specimen:  right breast tissue with additional lateral margin  DISPOSITION OF SPECIMEN:  PATHOLOGY  COUNTS:  YES  TOURNIQUET:  * No tourniquets in log *  DICTATION: .Dragon Dictation  After informed consent was obtained the patient was brought to the operating room and placed in the supine position on the operating table.  After adequate induction of general anesthesia the patient's right breast was prepped with ChloraPrep, allowed to dry, and draped in usual sterile manner.  An appropriate timeout was performed.  Previously an I-125 seed was placed in the inner subareolar right breast to mark an area of papilloma.  The neoprobe was set to I-125 in the area of radioactivity was readily identified.  The area around this was infiltrated with quarter percent Marcaine .  A curvilinear incision was then made with a 15 blade knife along the inner edge of the areola of the right breast.  The incision was carried through the skin and subcutaneous tissue sharply with the electrocautery.  Dissection was then carried very shallow in the subareolar area lateral to the incision.  This dissection was carried beyond the area of the nipple.  The dissection behind the nipple was as shallow as we could safely go without compromising the nipple.  Next a circular portion of breast tissue was removed sharply with the electrocautery around the radioactive seed while checking the  area of radioactivity frequently.  Once the tissue was removed it was oriented with the appropriate paint colors.  A specimen radiograph was obtained that showed the seed to be within the specimen.  I was not able to identify the clip.  I did take an additional lateral margin which I felt had the clip in it but still with x-ray there was no evidence of clip.  This margin was marked appropriately and all of the tissue was sent to pathology for further evaluation.  The entire subareolar area had been cleared out and that quadrant.  I did feel that we found 2 areas of papilloma that were removed, one being in the nipple.  At this point the remaining tissue appeared normal and fatty.  Hemostasis was achieved using the Bovie electrocautery.  The wound was irrigated with saline and infiltrated with more quarter percent Marcaine .  The deep layer of the wound was then closed with interrupted 3-0 Vicryl stitches.  The skin was closed with interrupted 4-0 Monocryl subcuticular stitches.  Dermabond dressings were applied.  The patient tolerated the procedure well.  At the end of the case all needle sponge and instrument counts were correct.  The patient was then awakened and taken to recovery in stable condition.  PLAN OF CARE: Discharge to home after PACU  PATIENT DISPOSITION:  PACU - hemodynamically stable.   Delay start of Pharmacological VTE agent (>24hrs) due to surgical blood loss or risk of bleeding: not applicable

## 2024-01-31 NOTE — Anesthesia Procedure Notes (Signed)
 Procedure Name: LMA Insertion Date/Time: 01/31/2024 8:34 AM  Performed by: Glo Larch, CRNAPre-anesthesia Checklist: Patient identified, Emergency Drugs available, Suction available and Patient being monitored Patient Re-evaluated:Patient Re-evaluated prior to induction Oxygen Delivery Method: Circle system utilized Preoxygenation: Pre-oxygenation with 100% oxygen Induction Type: IV induction Ventilation: Mask ventilation without difficulty LMA: LMA inserted LMA Size: 4.0 Number of attempts: 1 Airway Equipment and Method: Bite block Placement Confirmation: positive ETCO2 Tube secured with: Tape Dental Injury: Teeth and Oropharynx as per pre-operative assessment

## 2024-02-01 NOTE — Anesthesia Postprocedure Evaluation (Signed)
 Anesthesia Post Note  Patient: Shericka [name] Feuerborn  Procedure(s) Performed: BREAST LUMPECTOMY WITH RADIOACTIVE SEED LOCALIZATION (Right: Breast)     Patient location during evaluation: PACU Anesthesia Type: General Level of consciousness: awake and alert Pain management: pain level controlled Vital Signs Assessment: post-procedure vital signs reviewed and stable Respiratory status: spontaneous breathing, nonlabored ventilation and respiratory function stable Cardiovascular status: blood pressure returned to baseline and stable Postop Assessment: no apparent nausea or vomiting Anesthetic complications: no   No notable events documented.  Last Vitals:  Vitals:   01/31/24 1000 01/31/24 1037  BP:  (!) 140/77  Pulse: 64 61  Resp: 13 15  Temp:  (!) 36.2 C  SpO2: 100% 99%    Last Pain:  Vitals:   01/31/24 0716  TempSrc: Temporal  PainSc: 0-No pain                 Jadalyn Oliveri

## 2024-02-02 ENCOUNTER — Encounter (HOSPITAL_BASED_OUTPATIENT_CLINIC_OR_DEPARTMENT_OTHER): Payer: Self-pay | Admitting: General Surgery

## 2024-02-04 LAB — SURGICAL PATHOLOGY

## 2024-02-05 LAB — HM DIABETES EYE EXAM

## 2024-02-06 ENCOUNTER — Ambulatory Visit: Payer: Self-pay | Admitting: General Surgery

## 2024-03-20 ENCOUNTER — Ambulatory Visit
Admission: RE | Admit: 2024-03-20 | Discharge: 2024-03-20 | Disposition: A | Discharge status: Home / Self Care | Source: Ambulatory Visit | Attending: Hematology and Oncology | Admitting: Hematology and Oncology

## 2024-03-20 ENCOUNTER — Ambulatory Visit
Admission: RE | Admit: 2024-03-20 | Discharge: 2024-03-20 | Disposition: A | Discharge status: Home / Self Care | Source: Ambulatory Visit | Attending: Hematology and Oncology

## 2024-03-20 DIAGNOSIS — Z170 Estrogen receptor positive status [ER+]: Secondary | ICD-10-CM

## 2024-03-25 ENCOUNTER — Other Ambulatory Visit: Payer: 59

## 2024-05-22 ENCOUNTER — Inpatient Hospital Stay: Payer: 59 | Attending: Hematology and Oncology | Admitting: Hematology and Oncology

## 2024-05-22 VITALS — BP 150/84 | HR 66 | Temp 98.4°F | Resp 18 | Wt 187.1 lb

## 2024-05-22 DIAGNOSIS — Z801 Family history of malignant neoplasm of trachea, bronchus and lung: Secondary | ICD-10-CM | POA: Insufficient documentation

## 2024-05-22 DIAGNOSIS — Z923 Personal history of irradiation: Secondary | ICD-10-CM | POA: Diagnosis not present

## 2024-05-22 DIAGNOSIS — C50512 Malignant neoplasm of lower-outer quadrant of left female breast: Secondary | ICD-10-CM | POA: Diagnosis not present

## 2024-05-22 DIAGNOSIS — N6459 Other signs and symptoms in breast: Secondary | ICD-10-CM | POA: Diagnosis not present

## 2024-05-22 DIAGNOSIS — Z170 Estrogen receptor positive status [ER+]: Secondary | ICD-10-CM | POA: Diagnosis not present

## 2024-05-22 DIAGNOSIS — Z853 Personal history of malignant neoplasm of breast: Secondary | ICD-10-CM | POA: Insufficient documentation

## 2024-05-22 DIAGNOSIS — D249 Benign neoplasm of unspecified breast: Secondary | ICD-10-CM | POA: Diagnosis not present

## 2024-05-22 DIAGNOSIS — Z9221 Personal history of antineoplastic chemotherapy: Secondary | ICD-10-CM | POA: Insufficient documentation

## 2024-05-22 NOTE — Progress Notes (Signed)
 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 She continues under observation.  Discussed the use of AI scribe software for clinical note transcription with the patient, who gave verbal consent to proceed.  History of Present Illness Lindsey Wright is a 58 year old female who presents for follow-up after surgery for a benign papilloma.  She underwent surgery on May 23rd to remove a papilloma, which was confirmed to be benign with no atypia or cancer. The initial indication for the surgery was waking up with a brown stain on her pajama shirt, prompting her to contact her gynecologist. This led to a mammogram and ultrasound, both of which were unremarkable, but the decision was made to proceed with surgery for peace of mind.  Post-surgery, she had a follow-up mammogram in July, which showed no abnormalities. An ultrasound was also performed due to her recent surgery, and it too showed no concerning findings. She feels relieved that the results were normal.  She notes a change in the appearance of her nipple post-surgery, describing it as 'sucked in.' She finds this change strange but has not experienced any new symptoms or complications since the surgery.  No new medications, hospital visits, or significant changes in her health status since the last visit. No issues with breathing, bowel movements, or urinary symptoms, and no  headaches, double vision, or falls.      COVID 19 VACCINATION STATUS: Pfizer x3, most recently 09/2020   HISTORY OF CURRENT ILLNESS: From the original intake note:  Lindsey Wright palpated a mass in the left breast about 2 weeks ago. She followed up with her gynecologist, Dr. Kingsley who [facility]. She underwent unilateral left diagnostic mammography with tomography and left breast ultrason[facility] on 12[facility]20[facility]2017 showing: breast density category B. Suspicious newly palpable left breast mass at the 5 o'clock lower outer position with internal blow flow measuring 2.3 x 1.8 x 2.5 cm. There are either 2 adjacent masses or a single complicated mass at the 3:30 lower outer position, 4 cm from the nipple measuring 1.8 x 0.5 by 0.9 cm which may represent fibrocystic changes versus a solid mass. No other suspicious findings. Ultrasound showed normal axillary nodes.  Accordingly on 12/23/2017 she proceeded to biopsy of the left breast areas in question. The pathology from this procedure showed ([id number]): At both the 5 o'clock spanning 1.2 cm and 3:30 position spanning 0.7 cm, invasive ductal carcinoma, grade III. Prognostic indicators significant for: estrogen receptor, 10% positive with weak staining intensity and progesterone receptor, 0% negative. Proliferation marker Ki67 at 90%. HER2 amplified with ratios ER2/CEP17 signals 2.33 and average HER2 copies per cell 4.65  The patient's subsequent history is as detailed below.   PAST MEDICAL HISTORY: Past Medical History:  Diagnosis Date   Anemia    iron deficient anemia   Breast cancer (HCC)    left   Diabetes mellitus without complication (HCC)    History of Bell's palsy    x 2  as teenager and in 20's, last 2016   Hyperlipidemia    on meds   Hypertension    Menorrhagia    Overweight(278.02)    Peripheral edema    Personal history of chemotherapy    Personal history of radiation therapy      PAST SURGICAL HISTORY: Past Surgical History:  Procedure Laterality Date   BREAST BIOPSY Left 12/2017   BREAST BIOPSY Left 02/26/2020   BREAST BIOPSY Right 10/14/2023   US  RT BREAST BX W LOC DEV 1ST LESION IMG BX SPEC US  GUIDE 10/14/2023 [facility]   BREAST BIOPSY  01/28/2024   MM RT RADIOACTIVE SEED LOC MAMMO GUIDE 01/28/2024 [facility]   BREAST LUMPECTOMY Left 2019   BREAST LUMPECTOMY WITH RADIOACTIVE SEED AND SENTINEL LYMPH NODE BIOPSY Left 06/16/2018   Procedure: LEFT BREAST LUMPECTOMY WITH RADIOACTIVE SEED X'S 2 WITH SEED TARGETED LYMPH NODE EXCISION AND SENTINEL LYMPH NODE BIOPSY;  Surgeon: Curvin Deward MOULD, MD;  Location: [facility];  Service: General;  Laterality: Left;   BREAST LUMPECTOMY WITH RADIOACTIVE SEED LOCALIZATION Right 01/31/2024   Procedure: BREAST LUMPECTOMY WITH RADIOACTIVE SEED LOCALIZATION;  Surgeon: Curvin Deward MOULD, MD;  Location: [facility];  Service: General;  Laterality: Right;  RIGHT BREAST RADIOACTIVE SEED LOCALIZED LUMPECTOMY   CESAREAN SECTION CLASSICAL     x2   CHOLECYSTECTOMY     COLONOSCOPY     POLYPECTOMY     PORT-A-CATH REMOVAL Right 10/30/2018   Procedure: REMOVAL PORT-A-CATH;  Surgeon: Curvin Deward MOULD, MD;  Location: [facility];  Service: General;  Laterality: Right;   PORTACATH PLACEMENT Right 01/17/2018   Procedure: INSERTION PORT-A-CATH;  Surgeon: Curvin Deward MOULD, MD;  Location: [facility];  Service: General;  Laterality: Right;   TUBAL LIGATION      FAMILY HISTORY Family History  Problem Relation Age of Onset   Hypertension Mother    Diabetes Mother    Stroke Mother    Alcohol abuse Father    Cancer Father        lung ca   Hypertension Father    Hypertension Sister    Colon cancer Neg Hx    Colon polyps Neg Hx    Rectal cancer Neg Hx    Stomach cancer Neg Hx    Esophageal cancer Neg Hx   The patient's father was diagnosed with lung cancer at age 74 and died the  same year. The patient's mother died at age 29 due to several strokes. The patient had 1 brother who died due to strokes and possibly a MI. The patient has 1 sister. She denies a history of breast or ovarian cancer in the family.    GYNECOLOGIC HISTORY:  Patient's last menstrual period was 06/21/2008 (approximate). Menarche: 58 years old Age at first live birth: 58 years old The patient is GXP2. The patient is not having periods, with her LMP being in 2010. She used oral contraceptive for about 10 years with no complications. She never used HRT.    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  She retired in 2021, went back briefly to work and then we retired in June 2022.  Her husband, Jerona, works part time in Clinical biochemist.  The patient's older daughter Francina age works for con in the telemetry department and also in orthopedics.  She is getting married in 2022 and will start training and physical therapy January 2023.  She the patient's daughter  Kayla is a Holiday representative this year at [employer].  She hopes to become an Tourist information centre manager.  Both of the patient's daughters live with her.     ADVANCED DIRECTIVES: In the absence of any documentation to the contrary, the patient's spouse is their HCPOA.    HEALTH MAINTENANCE: Social History   Tobacco Use   Smoking status: Never   Smokeless tobacco: Never  Vaping Use   Vaping status: Never Used  Substance Use Topics   Alcohol use: No   Drug use: No     Colonoscopy: 03/27/2017 polyp removal/ Dr. Shila Hake  PAP: May 2017 normal  Bone density:   Allergies  Allergen Reactions   Ace Inhibitors Cough   Advil [Ibuprofen]     Hives - states able to tolerate 200mg  pill without any sxs     Current Outpatient Medications  Medication Sig Dispense Refill   Blood Glucose Monitoring Suppl (ONETOUCH VERIO FLEX SYSTEM) w/Device KIT To check glucose twice daily and prn for  uncontrolled diabetes type 2 (E11.9) 1 kit 0   Calcium -Vitamin D -Vitamin K (VIACTIV PO) Take by mouth. 1 chew BID     glucose blood (ONETOUCH VERIO) test strip CHECK BLOOD SUGAR TWICE DAILY 100 strip 1   Lancets (ONETOUCH ULTRASOFT) lancets To check glucose twice daily and prn for uncontrolled diabetes 2 (E11.9) 100 each 1   losartan  (COZAAR ) 50 MG tablet Take 1 tablet (50 mg total) by mouth daily. 90 tablet 3   metFORMIN  (GLUCOPHAGE ) 500 MG tablet Take 1 tablet (500 mg total) by mouth 2 (two) times daily with a meal. 180 tablet 3   potassium chloride  SA (KLOR-CON  M20) 20 MEQ tablet Take 2 tablets (40 mEq total) by mouth daily. 180 tablet 3   rosuvastatin  (CRESTOR ) 10 MG tablet Take 1 tablet (10 mg total) by mouth daily. 90 tablet 3   triamterene -hydrochlorothiazide (MAXZIDE) 75-50 MG tablet Take 0.5 tablets by mouth daily. 45 tablet 3   oxyCODONE  (ROXICODONE ) 5 MG immediate release tablet Take 1 tablet (5 mg total) by mouth every 6 (six) hours as needed for severe pain (pain score 7-10). (Patient not taking: Reported on 05/22/2024) 10 tablet 0   No current facility-administered medications for this visit.    OBJECTIVE: African-American woman who appears well  Vitals:   05/22/24 0938  BP: (!) 150/84  Pulse: 66  Resp: 18  Temp: 98.4 F (36.9 C)  SpO2: 98%      Body mass index is 30.2 kg/m.   Wt Readings from Last 3 Encounters:  05/22/24 187 lb 1.6 oz (84.9 kg)  01/31/24 183 lb 3.2 oz (83.1 kg)  01/22/24 184 lb 4 oz (83.6 kg)  ECOG FS:1  Physical Exam Constitutional:      Appearance: Normal appearance.  Cardiovascular:     Rate and Rhythm: Normal rate and regular rhythm.     Pulses: Normal pulses.     Heart sounds: Normal heart sounds.  Pulmonary:     Effort: Pulmonary effort is normal.     Breath sounds: Normal breath sounds.  Chest:     Comments: Breast: Bilateral breasts inspected. No palpable masses or regional adenopathy Right breast with inverted  nipple.  Musculoskeletal:        General: No swelling or tenderness. Normal range of motion.     Cervical back: Normal range of motion and neck supple. No rigidity.  Lymphadenopathy:     Cervical: No cervical adenopathy.  Skin:    General: Skin is warm and  dry.  Neurological:     General: No focal deficit present.     Mental Status: She is alert.       LAB RESULTS:  CMP     Component Value Date/Time   NA 139 01/27/2024 0925   K 3.8 01/27/2024 0925   CL 103 01/27/2024 0925   CO2 26 01/27/2024 0925   GLUCOSE 104 (H) 01/27/2024 0925   BUN 15 01/27/2024 0925   CREATININE 1.08 (H) 01/27/2024 0925   CREATININE 0.92 04/16/2022 0856   CALCIUM  9.7 01/27/2024 0925   PROT 7.5 10/07/2023 0820   ALBUMIN 4.6 10/07/2023 0820   AST 24 10/07/2023 0820   AST 23 04/16/2022 0856   ALT 20 10/07/2023 0820   ALT 18 04/16/2022 0856   ALKPHOS 78 10/07/2023 0820   BILITOT 0.7 10/07/2023 0820   BILITOT 0.6 04/16/2022 0856   GFRNONAA 60 (L) 01/27/2024 0925   GFRNONAA >60 04/16/2022 0856   GFRAA >60 02/09/2020 1454   GFRAA >60 10/07/2018 0858    No results found for: TOTALPROTELP, ALBUMINELP, A1GS, A2GS, BETS, BETA2SER, GAMS, MSPIKE, SPEI  No results found for: KPAFRELGTCHN, LAMBDASER, [facility]  Lab Results  Component Value Date   WBC 6.6 10/07/2023   NEUTROABS 4.0 10/07/2023   HGB 14.0 10/07/2023   HCT 42.7 10/07/2023   MCV 93.8 10/07/2023   PLT 246.0 10/07/2023   No results found for: LABCA2  No components found for: OJARJW874  No results for input(s): INR in the last 168 hours.  No results found for: LABCA2  No results found for: RJW800  No results found for: CAN125  No results found for: CAN153  No results found for: CA2729  No components found for: HGQUANT  No results found for: CEA1, CEA / No results found for: CEA1, CEA   No results found for: AFPTUMOR  No results found for: CHROMOGRNA  No results  found for: HGBA, HGBA2QUANT, HGBFQUANT, HGBSQUAN (Hemoglobinopathy evaluation)   No results found for: LDH  Lab Results  Component Value Date   IRON 104 09/27/2008   IRONPCTSAT 25.1 09/27/2008   (Iron and TIBC)  No results found for: FERRITIN  Urinalysis    Component Value Date/Time   COLORURINE YELLOW 02/15/2018 2014   APPEARANCEUR CLEAR 02/15/2018 2014   LABSPEC 1.011 02/15/2018 2014   PHURINE 7.0 02/15/2018 2014   GLUCOSEU NEGATIVE 02/15/2018 2014   HGBUR SMALL (A) 02/15/2018 2014   BILIRUBINUR NEGATIVE 02/15/2018 2014   KETONESUR NEGATIVE 02/15/2018 2014   PROTEINUR NEGATIVE 02/15/2018 2014   NITRITE NEGATIVE 02/15/2018 2014   LEUKOCYTESUR NEGATIVE 02/15/2018 2014    STUDIES: No results found.   ELIGIBLE FOR AVAILABLE RESEARCH PROTOCOL: Participating in exact sciences blood draw study   ASSESSMENT: 58 y.o.  [place],  woman status post left breast lower outer quadrant biopsy 12/23/2017 for a clinically multifocal T2 N0, stage II invasive ductal carcinoma, grade 3, essentially estrogen receptor and progesterone receptor negative, but HER-2 amplified, with an MIB-1 of 90%.  (a) breast MRI 01/07/2018 shows the 2.6 cm main mass and 4 additional smaller masses spanning 7.1 cm; there was a suspicious left axillary lymph node  (b) left axillary lymph node biopsy 01/14/2018 was benign/discordant (no lymph node tissue)  (1) neoadjuvant chemotherapy consisting of carboplatin  and docetaxel  every 21 days for 6 cycles started 01/20/2018, completed 05/05/2018, given in conjunction with anti-HER-2 immunotherapy  (2) anti-HER-2 immunotherapy consisting of trastuzumab  and Pertuzumab  started 01/20/2018, continued for 6 months, last dose 07/29/2018  (a) pertuzumab  discontinued after cycle  2 due to diarrhea  (b) baseline echocardiogram 01/08/2018 shows an ejection fraction in the 60-65% range  (c) echocardiogram on 04/24/2018 shows an ejection fraction in the 65-70%  range  (d) echocardiogram 07/31/2018 showed an ejection fraction in the 55-60% range  (3) left lumpectomy and sentinel lymph node biopsy 10 07 2019 showed a residual ypT1a ypN0 invasive ductal carcinoma, grade 3, with a repeat prognostic panel now triple negative  (4) adjuvant radiation 07/28/2018 - 09/15/2018  Site/dose: The patient initially received a dose of 50.4 Gy in 28 fractions to the left breast using whole-breast tangent fields. This was delivered using a 3-D conformal technique. The patient then received a boost to the seroma. This delivered an additional 10 Gy in 5 fractions using 6X, 10X photons with a Complex Isodose technique. The total dose was 60.4 Gy.    PLAN:  Assessment and Plan Assessment & Plan Benign breast papilloma, status post excision Status post excision with confirmed benign pathology. No post-operative complications. Imaging normal. - Encourage regular physical activity.  Inverted nipple, post-surgical change Inverted nipple post-surgery, no symptoms or complications.  History of breast cancer No concern for recurrence Continue SBE monthly Continue mammograms annually  RTC in one yr or sooner as needed.  Total encounter time 20 minutes.*  *Total Encounter Time as defined by the Centers for Medicare and Medicaid Services includes, in addition to the face-to-face time of a patient visit (documented in the note above) non-face-to-face time: obtaining and reviewing outside history, ordering and reviewing medications, tests or procedures, care coordination (communications with other health care professionals or caregivers) and documentation in the medical record.

## 2024-05-23 ENCOUNTER — Other Ambulatory Visit: Payer: Self-pay | Admitting: Family Medicine

## 2024-05-23 DIAGNOSIS — E119 Type 2 diabetes mellitus without complications: Secondary | ICD-10-CM

## 2024-07-01 LAB — HM PAP SMEAR: HPV, high-risk: NEGATIVE

## 2024-08-17 ENCOUNTER — Other Ambulatory Visit: Payer: Self-pay | Admitting: Family Medicine

## 2024-08-17 DIAGNOSIS — E119 Type 2 diabetes mellitus without complications: Secondary | ICD-10-CM

## 2024-08-19 NOTE — Telephone Encounter (Unsigned)
 Copied from CRM #[id number]. Topic: Clinical - Medication Refill >> Aug 19, 2024 12:33 PM Nessti S wrote: Medication: glucose blood (ONETOUCH VERIO) test strip   Has the patient contacted their pharmacy? Yes (Agent: If no, request that the patient contact the pharmacy for the refill. If patient does not wish to contact the pharmacy document the reason why and proceed with request.) (Agent: If yes, when and what did the pharmacy advise?)  This is the patient's preferred pharmacy:  [phone] [facility] [place] - [address] [place] [address] Phone: [phone] Fax: [phone]  Is this the correct pharmacy for this prescription? Yes If no, delete pharmacy and type the correct one.   Has the prescription been filled recently? no  Is the patient out of the medication? Yes  Has the patient been seen for an appointment in the last year OR does the patient have an upcoming appointment? Yes  Can we respond through MyChart? Yes  Agent: Please be advised that Rx refills may take up to 3 business days. We ask that you follow-up with your pharmacy.

## 2024-09-15 ENCOUNTER — Other Ambulatory Visit: Payer: Self-pay | Admitting: Family Medicine

## 2024-09-15 DIAGNOSIS — E119 Type 2 diabetes mellitus without complications: Secondary | ICD-10-CM

## 2024-10-11 ENCOUNTER — Other Ambulatory Visit: Payer: Self-pay | Admitting: Family Medicine

## 2024-10-16 ENCOUNTER — Ambulatory Visit: Admitting: Family Medicine

## 2024-10-16 ENCOUNTER — Encounter: Payer: Self-pay | Admitting: Family Medicine

## 2024-10-16 VITALS — BP 138/79 | HR 62 | Temp 98.2°F | Ht 65.75 in | Wt 184.5 lb

## 2024-10-16 DIAGNOSIS — Z0000 Encounter for general adult medical examination without abnormal findings: Secondary | ICD-10-CM

## 2024-10-16 DIAGNOSIS — E1169 Type 2 diabetes mellitus with other specified complication: Secondary | ICD-10-CM

## 2024-10-16 DIAGNOSIS — Z1211 Encounter for screening for malignant neoplasm of colon: Secondary | ICD-10-CM

## 2024-10-16 DIAGNOSIS — Z1159 Encounter for screening for other viral diseases: Secondary | ICD-10-CM | POA: Insufficient documentation

## 2024-10-16 DIAGNOSIS — E66811 Obesity, class 1: Secondary | ICD-10-CM

## 2024-10-16 DIAGNOSIS — I10 Essential (primary) hypertension: Secondary | ICD-10-CM

## 2024-10-16 DIAGNOSIS — E119 Type 2 diabetes mellitus without complications: Secondary | ICD-10-CM

## 2024-10-16 DIAGNOSIS — Z170 Estrogen receptor positive status [ER+]: Secondary | ICD-10-CM

## 2024-10-16 LAB — LIPID PANEL
Cholesterol: 95 mg/dL (ref 28–200)
HDL: 45.6 mg/dL
LDL Cholesterol: 37 mg/dL (ref 10–99)
NonHDL: 49.49
Total CHOL/HDL Ratio: 2
Triglycerides: 63 mg/dL (ref 10.0–149.0)
VLDL: 12.6 mg/dL (ref 0.0–40.0)

## 2024-10-16 LAB — COMPREHENSIVE METABOLIC PANEL WITH GFR
ALT: 20 U/L (ref 3–35)
AST: 23 U/L (ref 5–37)
Albumin: 4.5 g/dL (ref 3.5–5.2)
Alkaline Phosphatase: 77 U/L (ref 39–117)
BUN: 15 mg/dL (ref 6–23)
CO2: 30 meq/L (ref 19–32)
Calcium: 9.5 mg/dL (ref 8.4–10.5)
Chloride: 101 meq/L (ref 96–112)
Creatinine, Ser: 0.95 mg/dL (ref 0.40–1.20)
GFR: 66.21 mL/min
Glucose, Bld: 97 mg/dL (ref 70–99)
Potassium: 3.5 meq/L (ref 3.5–5.1)
Sodium: 139 meq/L (ref 135–145)
Total Bilirubin: 0.5 mg/dL (ref 0.2–1.2)
Total Protein: 7.9 g/dL (ref 6.0–8.3)

## 2024-10-16 LAB — CBC WITH DIFFERENTIAL/PLATELET
Basophils Absolute: 0.1 10*3/uL (ref 0.0–0.1)
Basophils Relative: 0.8 % (ref 0.0–3.0)
Eosinophils Absolute: 0.1 10*3/uL (ref 0.0–0.7)
Eosinophils Relative: 2.2 % (ref 0.0–5.0)
HCT: 39.9 % (ref 36.0–46.0)
Hemoglobin: 13.4 g/dL (ref 12.0–15.0)
Lymphocytes Relative: 26.9 % (ref 12.0–46.0)
Lymphs Abs: 1.8 10*3/uL (ref 0.7–4.0)
MCHC: 33.5 g/dL (ref 30.0–36.0)
MCV: 92 fl (ref 78.0–100.0)
Monocytes Absolute: 0.5 10*3/uL (ref 0.1–1.0)
Monocytes Relative: 7.3 % (ref 3.0–12.0)
Neutro Abs: 4.2 10*3/uL (ref 1.4–7.7)
Neutrophils Relative %: 62.8 % (ref 43.0–77.0)
Platelets: 216 10*3/uL (ref 150.0–400.0)
RBC: 4.33 Mil/uL (ref 3.87–5.11)
RDW: 14.1 % (ref 11.5–15.5)
WBC: 6.6 10*3/uL (ref 4.0–10.5)

## 2024-10-16 LAB — TSH: TSH: 1.16 u[IU]/mL (ref 0.35–5.50)

## 2024-10-16 LAB — MICROALBUMIN / CREATININE URINE RATIO
Creatinine,U: 31 mg/dL
Microalb Creat Ratio: 23.5 mg/g (ref 0.0–30.0)
Microalb, Ur: 0.7 mg/dL (ref 0.7–1.9)

## 2024-10-16 LAB — HEMOGLOBIN A1C: Hgb A1c MFr Bld: 6.8 % — ABNORMAL HIGH (ref 4.6–6.5)

## 2024-10-16 NOTE — Assessment & Plan Note (Signed)
 Onc care S/p lumpectomy, chemo and radiation Thinks she is doing well

## 2024-10-16 NOTE — Assessment & Plan Note (Signed)
 Disc goals for lipids and reasons to control them Rev last labs with pt Rev low sat fat diet in detail   Crestor  10 mg daily  LDL of 50 last time  Lab today

## 2024-10-16 NOTE — Patient Instructions (Addendum)
 If you are interested in the shingles vaccine series (Shingrix), call your insurance or pharmacy to check on coverage and location it must be given.  If affordable - you can schedule it here or at your pharmacy depending on coverage    You are due for a a colonoscopy in July  If no one reaches out by mid summer let us  know    Keep walking  Add more strength training  Add some strength training to your routine, this is important for bone and brain health and can reduce your risk of falls and help your body use insulin properly and regulate weight  Light weights, exercise bands , and internet videos are a good way to start  Yoga (chair or regular), machines , floor exercises or a gym with machines are also good options   I want to get your blood pressure a little lower Let's get labs back-I may increase losartan  to 100 mg daily   Start checking blood pressure at home / when relaxed   Lab today

## 2024-10-16 NOTE — Assessment & Plan Note (Signed)
 Colonoscopy 03/2022 with 3 y recall for polyps

## 2024-10-16 NOTE — Assessment & Plan Note (Signed)
 bp is not entirely at goal BP Readings from Last 1 Encounters:  10/16/24 138/79   No changes needed Most recent labs reviewed  Disc lifstyle change with low sodium diet and exercise  On losartan  50 mg daily , may increase this after lab results Triam-hct 36.5-25 mg daily   Given handout re: checking blood pressure at home

## 2024-10-16 NOTE — Assessment & Plan Note (Signed)
 Lab Results  Component Value Date   HGBA1C 6.1 (A) 01/22/2024   HGBA1C 6.6 (H) 10/07/2023   HGBA1C 6.1 (A) 04/10/2023   A1c and microalb today Normal foot exam   Discussed goals for low glycemic diet and exercise and weight loss Continues metformin  500 mg bid

## 2024-10-16 NOTE — Progress Notes (Signed)
 "  Subjective:    Patient ID: Lindsey Wright, female    DOB: 1965-12-05, 59 y.o.   MRN: [id number]  HPI  Here for health maintenance exam and to review chronic medical problems   Wt Readings from Last 3 Encounters:  10/16/24 184 lb 8 oz (83.7 kg)  05/22/24 187 lb 1.6 oz (84.9 kg)  01/31/24 183 lb 3.2 oz (83.1 kg)   30.01 kg/m  Vitals:   10/16/24 0859 10/16/24 0917  BP: (!) 142/78 138/79  Pulse: 62   Temp: 98.2 F (36.8 C)   SpO2: 99%     Immunization History  Administered Date(s) Administered   Fluad Quad(high Dose 65+) 06/19/2022   Influenza, Mdck, Trivalent,PF 6+ MOS(egg free) 06/20/2023, 06/30/2024   Influenza,inj,Quad PF,6+ Mos 06/04/2019, 06/29/2020, 08/25/2021   Moderna Covid-19 Fall Seasonal Vaccine 68yrs & older 06/19/2022   PFIZER Comirnaty(Gray Top)Covid-19 Tri-Sucrose Vaccine 04/04/2021   PFIZER(Purple Top)SARS-COV-2 Vaccination 12/17/2019, 09/22/2020   PNEUMOCOCCAL CONJUGATE-20 10/16/2023   Pfizer(Comirnaty)Fall Seasonal Vaccine 12 years and older 06/30/2024   Td 10/10/2022   Tdap 09/15/2012   Unspecified SARS-COV-2 Vaccination 12/17/2019, 01/11/2020    Health Maintenance Due  Topic Date Due   Diabetic kidney evaluation - Urine ACR  Never done   Hepatitis C Screening  Never done   Hepatitis B Vaccines 19-59 Average Risk (1 of 3 - 19+ 3-dose series) Never done   FOOT EXAM  04/09/2024   HEMOGLOBIN A1C  07/24/2024   Doing ok   Interested in hep C screening-low risk   Shingrix -thinking about it / plans to check on coverage   Mammogram 03/2024  Personal history of breast cancer -lumpectomy and chemo and radiation  Doing well with follow up  Self breast exam-no changes  Recent visit/exam from oncology   Gyn health Pap 06/2024   Colon cancer screening  Colonoscopy 03/2022 with 3 y recall   Bone health   Falls-none  Fractures-none  Supplements  Vitamin D    Exercise  Walking  Did shovel snow / working outdoors in summer     Mood    10/16/2024    9:05 AM 05/22/2024    9:00 AM 01/22/2024    8:10 AM 04/10/2023    8:23 AM 10/10/2022    9:11 AM  Depression screen PHQ 2/9  Decreased Interest 0 0 0 0 0  Down, Depressed, Hopeless 0 0 0 0 0  PHQ - 2 Score 0 0 0 0 0  Altered sleeping 0  0 0 0  Tired, decreased energy 0  0 0 0  Change in appetite 0  0 0 0  Feeling bad or failure about yourself  0  0 0 0  Trouble concentrating 0  0 0 0  Moving slowly or fidgety/restless 0  0 0 0  Suicidal thoughts 0  0 0 0  PHQ-9 Score 0  0  0  0   Difficult doing work/chores Not difficult at all  Not difficult at all Not difficult at all Not difficult at all     Data saved with a previous flowsheet row definition    HTN bp is stable today  No cp or palpitations or headaches or edema  No side effects to medicines  BP Readings from Last 3 Encounters:  10/16/24 138/79  05/22/24 (!) 150/84  01/31/24 (!) 140/77    Losartan  50 mg daily  Triam/hct 75-50 mg half pill daily Due for labs   Lab Results  Component Value Date   NA 139 01/27/2024  K 3.8 01/27/2024   CO2 26 01/27/2024   GLUCOSE 104 (H) 01/27/2024   BUN 15 01/27/2024   CREATININE 1.08 (H) 01/27/2024   CALCIUM  9.7 01/27/2024   GFR 68.41 10/07/2023   GFRNONAA 60 (L) 01/27/2024    DM2 Lab Results  Component Value Date   HGBA1C 6.1 (A) 01/22/2024   HGBA1C 6.6 (H) 10/07/2023   HGBA1C 6.1 (A) 04/10/2023   No results found for: LABMICR, MICROALBUR  Metformin  500 mg bid  Eye exam utd On arb and statin   Diet has been ok  Not always optimal  She craves sweet   Hyperlipidemia Lab Results  Component Value Date   CHOL 114 10/07/2023   HDL 49.10 10/07/2023   LDLCALC 50 10/07/2023   TRIG 76.0 10/07/2023   CHOLHDL 2 10/07/2023   Crestor  10 mg daily     Patient Active Problem List   Diagnosis Date Noted   Encounter for hepatitis C screening test for low risk patient 10/16/2024   Intraductal papilloma of breast 01/22/2024   Routine general  medical examination at a health care facility 10/10/2022   Hyperlipidemia associated with type 2 diabetes mellitus (HCC) 10/04/2019   Low back pain 11/17/2018   Malignant neoplasm of lower-outer quadrant of 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, estrogen receptor positive (HCC) 12/26/2017   Colon cancer screening 01/29/2017   Controlled type 2 diabetes mellitus without complication, without long-term current use of insulin (HCC) 09/25/2013   Obesity due to excess calories 09/25/2013   Essential hypertension 08/15/2007   Past Medical History:  Diagnosis Date   Anemia    iron deficient anemia   Breast cancer (HCC)    left   Diabetes mellitus without complication (HCC)    History of Bell's palsy    x 2 as teenager and in 20's, last 2016   Hyperlipidemia    on meds   Hypertension    Menorrhagia    Overweight(278.02)    Peripheral edema    Personal history of chemotherapy    Personal history of radiation therapy    Past Surgical History:  Procedure Laterality Date   BREAST BIOPSY Left 12/2017   BREAST BIOPSY Left 02/26/2020   BREAST BIOPSY Right 10/14/2023   US  RT BREAST BX W LOC DEV 1ST LESION IMG BX SPEC US  GUIDE 10/14/2023 [facility]   BREAST BIOPSY  01/28/2024   MM RT RADIOACTIVE SEED LOC MAMMO GUIDE 01/28/2024 [facility]   BREAST LUMPECTOMY Left 2019   BREAST LUMPECTOMY WITH RADIOACTIVE SEED AND SENTINEL LYMPH NODE BIOPSY Left 06/16/2018   Procedure: LEFT BREAST LUMPECTOMY WITH RADIOACTIVE SEED X'S 2 WITH SEED TARGETED LYMPH NODE EXCISION AND SENTINEL LYMPH NODE BIOPSY;  Surgeon: Curvin Deward MOULD, MD;  Location: [facility];  Service: General;  Laterality: Left;   BREAST LUMPECTOMY WITH RADIOACTIVE SEED LOCALIZATION Right 01/31/2024   Procedure: BREAST LUMPECTOMY WITH RADIOACTIVE SEED LOCALIZATION;  Surgeon: Curvin Deward MOULD, MD;  Location: [facility];  Service: General;  Laterality: Right;  RIGHT BREAST RADIOACTIVE SEED LOCALIZED LUMPECTOMY   CESAREAN  SECTION CLASSICAL     x2   CHOLECYSTECTOMY     COLONOSCOPY     POLYPECTOMY     PORT-A-CATH REMOVAL Right 10/30/2018   Procedure: REMOVAL PORT-A-CATH;  Surgeon: Curvin Deward MOULD, MD;  Location: [facility];  Service: General;  Laterality: Right;   PORTACATH PLACEMENT Right 01/17/2018   Procedure: INSERTION PORT-A-CATH;  Surgeon: Curvin Deward MOULD, MD;  Location:  SURGERY CENTER;  Service:  General;  Laterality: Right;   TUBAL LIGATION     Social History[1] Family History  Problem Relation Age of Onset   Hypertension Mother    Diabetes Mother    Stroke Mother    Alcohol abuse Father    Cancer Father        lung ca   Hypertension Father    Hypertension Sister    Colon cancer Neg Hx    Colon polyps Neg Hx    Rectal cancer Neg Hx    Stomach cancer Neg Hx    Esophageal cancer Neg Hx    Allergies[2] Medications Ordered Prior to Encounter[3]  Review of Systems  Constitutional:  Negative for activity change, appetite change, fatigue, fever and unexpected weight change.  HENT:  Negative for congestion, ear pain, rhinorrhea, sinus pressure and sore throat.   Eyes:  Negative for pain, redness and visual disturbance.  Respiratory:  Negative for cough, shortness of breath and wheezing.   Cardiovascular:  Negative for chest pain and palpitations.  Gastrointestinal:  Negative for abdominal pain, blood in stool, constipation and diarrhea.  Endocrine: Negative for polydipsia and polyuria.       Craves sweets/sugar   Genitourinary:  Negative for dysuria, frequency and urgency.  Musculoskeletal:  Negative for arthralgias, back pain and myalgias.  Skin:  Negative for pallor and rash.  Allergic/Immunologic: Negative for environmental allergies.  Neurological:  Negative for dizziness, syncope and headaches.  Hematological:  Negative for adenopathy. Does not bruise/bleed easily.  Psychiatric/Behavioral:  Negative for decreased concentration and dysphoric mood. The patient is  not nervous/anxious.        Objective:   Physical Exam Constitutional:      General: She is not in acute distress.    Appearance: Normal appearance. She is well-developed. She is obese. She is not ill-appearing or diaphoretic.  HENT:     Head: Normocephalic and atraumatic.     Right Ear: Tympanic membrane, ear canal and external ear normal.     Left Ear: Tympanic membrane, ear canal and external ear normal.     Nose: Nose normal. No congestion.     Mouth/Throat:     Mouth: Mucous membranes are moist.     Pharynx: Oropharynx is clear. No posterior oropharyngeal erythema.  Eyes:     General: No scleral icterus.    Extraocular Movements: Extraocular movements intact.     Conjunctiva/sclera: Conjunctivae normal.     Pupils: Pupils are equal, round, and reactive to light.  Neck:     Thyroid : No thyromegaly.     Vascular: No carotid bruit or JVD.  Cardiovascular:     Rate and Rhythm: Normal rate and regular rhythm.     Pulses: Normal pulses.     Heart sounds: Normal heart sounds.     No gallop.  Pulmonary:     Effort: Pulmonary effort is normal. No respiratory distress.     Breath sounds: Normal breath sounds. No wheezing.     Comments: Good air exch Chest:     Chest wall: No tenderness.  Abdominal:     General: Bowel sounds are normal. There is no distension or abdominal bruit.     Palpations: Abdomen is soft. There is no mass.     Tenderness: There is no abdominal tenderness.     Hernia: No hernia is present.  Genitourinary:    Comments: Recent breast exam by oncology   Musculoskeletal:        General: No tenderness. Normal range of motion.  Cervical back: Normal range of motion and neck supple. No rigidity. No muscular tenderness.     Right lower leg: No edema.     Left lower leg: No edema.     Comments: No kyphosis   Lymphadenopathy:     Cervical: No cervical adenopathy.  Skin:    General: Skin is warm and dry.     Coloration: Skin is not pale.     Findings: No  erythema or rash.     Comments: Some scattered skin tags   Neurological:     Mental Status: She is alert. Mental status is at baseline.     Cranial Nerves: No cranial nerve deficit.     Motor: No abnormal muscle tone.     Coordination: Coordination normal.     Gait: Gait normal.     Deep Tendon Reflexes: Reflexes are normal and symmetric. Reflexes normal.  Psychiatric:        Mood and Affect: Mood normal.        Cognition and Memory: Cognition and memory normal.           Assessment & Plan:   Problem List Items Addressed This Visit       Cardiovascular and Mediastinum   Essential hypertension   bp is not entirely at goal BP Readings from Last 1 Encounters:  10/16/24 138/79   No changes needed Most recent labs reviewed  Disc lifstyle change with low sodium diet and exercise  On losartan  50 mg daily , may increase this after lab results Triam-hct 36.5-25 mg daily   Given handout re: checking blood pressure at home       Relevant Orders   TSH   Lipid Panel   Comprehensive metabolic panel with GFR   CBC with Differential/Platelet     Endocrine   Hyperlipidemia associated with type 2 diabetes mellitus (HCC)   Disc goals for lipids and reasons to control them Rev last labs with pt Rev low sat fat diet in detail   Crestor  10 mg daily  LDL of 50 last time  Lab today      Relevant Orders   Lipid Panel   Comprehensive metabolic panel with GFR   Controlled type 2 diabetes mellitus without complication, without long-term current use of insulin (HCC)   Lab Results  Component Value Date   HGBA1C 6.1 (A) 01/22/2024   HGBA1C 6.6 (H) 10/07/2023   HGBA1C 6.1 (A) 04/10/2023   A1c and microalb today Normal foot exam   Discussed goals for low glycemic diet and exercise and weight loss Continues metformin  500 mg bid       Relevant Orders   Hemoglobin A1c   Microalbumin / creatinine urine ratio     Other   Routine general medical examination at a health care  facility - Primary   Reviewed health habits including diet and exercise and skin cancer prevention Reviewed appropriate screening tests for age  Also reviewed health mt list, fam hx and immunization status , as well as social and family history   See HPI Labs reviewed and ordered Health Maintenance  Topic Date Due   Kidney health urinalysis for diabetes  Never done   Hepatitis C Screening  Never done   Hepatitis B Vaccine (1 of 3 - 19+ 3-dose series) Never done   Complete foot exam   04/09/2024   Hemoglobin A1C  07/24/2024   Zoster (Shingles) Vaccine (1 of 2) 01/13/2026*   COVID-19 Vaccine (8 - Mixed Product risk 2025-26  season) 12/29/2024   Yearly kidney function blood test for diabetes  01/26/2025   Eye exam for diabetics  02/04/2025   Breast Cancer Screening  03/20/2025   Colon Cancer Screening  04/04/2025   Pap with HPV screening  07/01/2029   DTaP/Tdap/Td vaccine (3 - Td or Tdap) 10/10/2032   Pneumococcal Vaccine for age over 53  Completed   Flu Shot  Completed   HPV Vaccine (No Doses Required) Completed   HIV Screening  Completed   Meningitis B Vaccine  Aged Out  *Topic was postponed. The date shown is not the original due date.   Plans to check on coverage of shingrix Hep C screen today [facility] for treated breast cancer  [facility]  Colonoscopy duee 03/2025 for 3 y recall/polyps No falls/fractures  Discussed fall prevention, supplements and exercise for bone density  PHQ 0 Lab today       Obesity due to excess calories   Discussed how this problem influences overall health and the risks it imposes  Reviewed plan for weight loss with lower calorie diet (via better food choices (lower glycemic and portion control) along with exercise building up to or more than 30 minutes 5 days per week including some aerobic activity and strength training         Malignant neoplasm of lower-outer quadrant of 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, estrogen receptor positive (HCC)   Onc  care S/p lumpectomy, chemo and radiation Thinks she is doing well        Encounter for hepatitis C screening test for low risk patient   Hep C screen today      Relevant Orders   Hepatitis C antibody   Colon cancer screening   Colonoscopy 03/2022 with 3 y recall for polyps         [1]  Social History Tobacco Use   Smoking status: Never   Smokeless tobacco: Never  Vaping Use   Vaping status: Never Used  Substance Use Topics   Alcohol use: No   Drug use: No  [2]  Allergies Allergen Reactions   Ace Inhibitors Cough   Advil [Ibuprofen]     Hives - states able to tolerate 200mg  pill without any sxs   [3]  Current Outpatient Medications on File Prior to Visit  Medication Sig Dispense Refill   Blood Glucose Monitoring Suppl (ONETOUCH VERIO FLEX SYSTEM) w/Device KIT To check glucose twice daily and prn for uncontrolled diabetes type 2 (E11.9) 1 kit 0   Calcium -Vitamin D -Vitamin K (VIACTIV PO) Take by mouth. 1 chew BID     glucose blood (ONETOUCH VERIO) test strip CHECK BLOOD SUGAR TWICE DAILY 100 strip 0   Lancets (ONETOUCH ULTRASOFT) lancets To check glucose twice daily and prn for uncontrolled diabetes 2 (E11.9) 100 each 1   losartan  (COZAAR ) 50 MG tablet Take 1 tablet (50 mg total) by mouth daily. 90 tablet 3   metFORMIN  (GLUCOPHAGE ) 500 MG tablet TAKE 1 TABLET BY MOUTH 2 TIMES DAILY WITH A MEAL. 180 tablet 0   potassium chloride  SA (KLOR-CON  M20) 20 MEQ tablet Take 2 tablets (40 mEq total) by mouth daily. 180 tablet 3   rosuvastatin  (CRESTOR ) 10 MG tablet Take 1 tablet (10 mg total) by mouth daily. 90 tablet 3   triamterene -hydrochlorothiazide (MAXZIDE) 75-50 MG tablet Take 0.5 tablets by mouth daily. 45 tablet 3   No current facility-administered medications on file prior to visit.   "

## 2024-10-16 NOTE — Assessment & Plan Note (Signed)
Hep C screen today 

## 2024-10-16 NOTE — Assessment & Plan Note (Addendum)
 Reviewed health habits including diet and exercise and skin cancer prevention Reviewed appropriate screening tests for age  Also reviewed health mt list, fam hx and immunization status , as well as social and family history   See HPI Labs reviewed and ordered Health Maintenance  Topic Date Due   Kidney health urinalysis for diabetes  Never done   Hepatitis C Screening  Never done   Hepatitis B Vaccine (1 of 3 - 19+ 3-dose series) Never done   Complete foot exam   04/09/2024   Hemoglobin A1C  07/24/2024   Zoster (Shingles) Vaccine (1 of 2) 01/13/2026*   COVID-19 Vaccine (8 - Mixed Product risk 2025-26 season) 12/29/2024   Yearly kidney function blood test for diabetes  01/26/2025   Eye exam for diabetics  02/04/2025   Breast Cancer Screening  03/20/2025   Colon Cancer Screening  04/04/2025   Pap with HPV screening  07/01/2029   DTaP/Tdap/Td vaccine (3 - Td or Tdap) 10/10/2032   Pneumococcal Vaccine for age over 28  Completed   Flu Shot  Completed   HPV Vaccine (No Doses Required) Completed   HIV Screening  Completed   Meningitis B Vaccine  Aged Out  *Topic was postponed. The date shown is not the original due date.   Plans to check on coverage of shingrix Hep C screen today [facility] for treated breast cancer  [facility]  Colonoscopy duee 03/2025 for 3 y recall/polyps No falls/fractures  Discussed fall prevention, supplements and exercise for bone density  PHQ 0 Lab today

## 2024-10-16 NOTE — Assessment & Plan Note (Signed)
 Discussed how this problem influences overall health and the risks it imposes  Reviewed plan for weight loss with lower calorie diet (via better food choices (lower glycemic and portion control) along with exercise building up to or more than 30 minutes 5 days per week including some aerobic activity and strength training

## 2025-05-25 ENCOUNTER — Ambulatory Visit: Admitting: Hematology and Oncology
